# Patient Record
Sex: Female | Born: 1940 | Race: White | Hispanic: No | Smoking: Never smoker
Health system: Southern US, Community
[De-identification: ages and names within clinical notes are randomized; demographics above are authoritative.]

## PROBLEM LIST (undated history)

## (undated) DIAGNOSIS — M1990 Unspecified osteoarthritis, unspecified site: Secondary | ICD-10-CM

## (undated) DIAGNOSIS — C8300 Small cell B-cell lymphoma, unspecified site: Secondary | ICD-10-CM

## (undated) DIAGNOSIS — C8218 Follicular lymphoma grade II, lymph nodes of multiple sites: Secondary | ICD-10-CM

## (undated) DIAGNOSIS — I10 Essential (primary) hypertension: Secondary | ICD-10-CM

## (undated) DIAGNOSIS — Z86010 Personal history of colonic polyps: Principal | ICD-10-CM

## (undated) DIAGNOSIS — K219 Gastro-esophageal reflux disease without esophagitis: Secondary | ICD-10-CM

## (undated) DIAGNOSIS — D649 Anemia, unspecified: Secondary | ICD-10-CM

## (undated) DIAGNOSIS — E785 Hyperlipidemia, unspecified: Secondary | ICD-10-CM

## (undated) DIAGNOSIS — Z7189 Other specified counseling: Secondary | ICD-10-CM

## (undated) DIAGNOSIS — R011 Cardiac murmur, unspecified: Secondary | ICD-10-CM

## (undated) DIAGNOSIS — F32A Depression, unspecified: Secondary | ICD-10-CM

## (undated) DIAGNOSIS — F329 Major depressive disorder, single episode, unspecified: Secondary | ICD-10-CM

## (undated) DIAGNOSIS — E079 Disorder of thyroid, unspecified: Secondary | ICD-10-CM

## (undated) HISTORY — DX: Anemia, unspecified: D64.9

## (undated) HISTORY — DX: Cardiac murmur, unspecified: R01.1

## (undated) HISTORY — DX: Other specified counseling: Z71.89

## (undated) HISTORY — DX: Small cell B-cell lymphoma, unspecified site: C83.00

## (undated) HISTORY — DX: Gastro-esophageal reflux disease without esophagitis: K21.9

## (undated) HISTORY — DX: Disorder of thyroid, unspecified: E07.9

## (undated) HISTORY — DX: Unspecified osteoarthritis, unspecified site: M19.90

## (undated) HISTORY — DX: Hyperlipidemia, unspecified: E78.5

## (undated) HISTORY — DX: Major depressive disorder, single episode, unspecified: F32.9

## (undated) HISTORY — PX: TUBAL LIGATION: SHX77

## (undated) HISTORY — DX: Follicular lymphoma grade ii, lymph nodes of multiple sites: C82.18

## (undated) HISTORY — DX: Personal history of colonic polyps: Z86.010

## (undated) HISTORY — DX: Depression, unspecified: F32.A

## (undated) HISTORY — DX: Essential (primary) hypertension: I10

## (undated) HISTORY — PX: TONSILLECTOMY: SUR1361

---

## 2000-07-29 ENCOUNTER — Ambulatory Visit (HOSPITAL_COMMUNITY): Admission: RE | Admit: 2000-07-29 | Discharge: 2000-07-29 | Payer: Self-pay | Admitting: Obstetrics and Gynecology

## 2000-07-29 ENCOUNTER — Encounter: Payer: Self-pay | Admitting: Obstetrics and Gynecology

## 2000-08-10 ENCOUNTER — Other Ambulatory Visit: Admission: RE | Admit: 2000-08-10 | Discharge: 2000-08-10 | Payer: Self-pay | Admitting: Obstetrics and Gynecology

## 2001-08-20 ENCOUNTER — Encounter: Payer: Self-pay | Admitting: Obstetrics and Gynecology

## 2001-08-20 ENCOUNTER — Ambulatory Visit (HOSPITAL_COMMUNITY): Admission: RE | Admit: 2001-08-20 | Discharge: 2001-08-20 | Payer: Self-pay | Admitting: Obstetrics and Gynecology

## 2001-08-30 ENCOUNTER — Other Ambulatory Visit: Admission: RE | Admit: 2001-08-30 | Discharge: 2001-08-30 | Payer: Self-pay | Admitting: Obstetrics and Gynecology

## 2002-09-09 ENCOUNTER — Encounter: Payer: Self-pay | Admitting: Obstetrics and Gynecology

## 2002-09-09 ENCOUNTER — Ambulatory Visit (HOSPITAL_COMMUNITY): Admission: RE | Admit: 2002-09-09 | Discharge: 2002-09-09 | Payer: Self-pay | Admitting: Obstetrics and Gynecology

## 2003-10-25 ENCOUNTER — Ambulatory Visit (HOSPITAL_COMMUNITY): Admission: RE | Admit: 2003-10-25 | Discharge: 2003-10-25 | Payer: Self-pay | Admitting: Obstetrics and Gynecology

## 2004-12-03 ENCOUNTER — Ambulatory Visit: Admission: RE | Admit: 2004-12-03 | Discharge: 2004-12-03 | Payer: Self-pay | Admitting: Obstetrics and Gynecology

## 2005-05-09 ENCOUNTER — Ambulatory Visit: Payer: Self-pay | Admitting: Family Medicine

## 2005-05-28 ENCOUNTER — Ambulatory Visit: Payer: Self-pay | Admitting: Family Medicine

## 2006-08-17 ENCOUNTER — Ambulatory Visit: Payer: Self-pay | Admitting: Family Medicine

## 2006-09-15 ENCOUNTER — Ambulatory Visit: Payer: Self-pay | Admitting: Internal Medicine

## 2006-10-12 ENCOUNTER — Ambulatory Visit: Payer: Self-pay | Admitting: Family Medicine

## 2006-10-13 ENCOUNTER — Ambulatory Visit (HOSPITAL_COMMUNITY): Admission: RE | Admit: 2006-10-13 | Discharge: 2006-10-13 | Payer: Self-pay | Admitting: Family Medicine

## 2007-11-26 ENCOUNTER — Ambulatory Visit: Payer: Self-pay | Admitting: Family Medicine

## 2007-11-26 DIAGNOSIS — Z9089 Acquired absence of other organs: Secondary | ICD-10-CM | POA: Insufficient documentation

## 2007-11-26 DIAGNOSIS — F329 Major depressive disorder, single episode, unspecified: Secondary | ICD-10-CM

## 2007-11-26 DIAGNOSIS — E039 Hypothyroidism, unspecified: Secondary | ICD-10-CM | POA: Insufficient documentation

## 2007-11-26 DIAGNOSIS — I10 Essential (primary) hypertension: Secondary | ICD-10-CM

## 2007-11-29 ENCOUNTER — Encounter (INDEPENDENT_AMBULATORY_CARE_PROVIDER_SITE_OTHER): Payer: Self-pay | Admitting: *Deleted

## 2007-12-07 ENCOUNTER — Telehealth (INDEPENDENT_AMBULATORY_CARE_PROVIDER_SITE_OTHER): Payer: Self-pay | Admitting: *Deleted

## 2007-12-10 ENCOUNTER — Encounter: Payer: Self-pay | Admitting: Family Medicine

## 2007-12-14 ENCOUNTER — Ambulatory Visit: Payer: Self-pay | Admitting: Family Medicine

## 2007-12-14 DIAGNOSIS — E8881 Metabolic syndrome: Secondary | ICD-10-CM

## 2007-12-14 DIAGNOSIS — E782 Mixed hyperlipidemia: Secondary | ICD-10-CM

## 2008-02-18 ENCOUNTER — Ambulatory Visit (HOSPITAL_COMMUNITY): Admission: RE | Admit: 2008-02-18 | Discharge: 2008-02-18 | Payer: Self-pay | Admitting: Family Medicine

## 2008-02-25 ENCOUNTER — Encounter (INDEPENDENT_AMBULATORY_CARE_PROVIDER_SITE_OTHER): Payer: Self-pay | Admitting: *Deleted

## 2008-03-23 ENCOUNTER — Encounter: Payer: Self-pay | Admitting: Family Medicine

## 2008-03-23 ENCOUNTER — Ambulatory Visit: Payer: Self-pay | Admitting: Family Medicine

## 2008-03-23 ENCOUNTER — Other Ambulatory Visit: Admission: RE | Admit: 2008-03-23 | Discharge: 2008-03-23 | Payer: Self-pay | Admitting: Family Medicine

## 2008-03-23 DIAGNOSIS — Z9889 Other specified postprocedural states: Secondary | ICD-10-CM

## 2008-03-28 ENCOUNTER — Telehealth (INDEPENDENT_AMBULATORY_CARE_PROVIDER_SITE_OTHER): Payer: Self-pay | Admitting: *Deleted

## 2008-04-02 ENCOUNTER — Encounter (INDEPENDENT_AMBULATORY_CARE_PROVIDER_SITE_OTHER): Payer: Self-pay | Admitting: *Deleted

## 2008-04-05 ENCOUNTER — Telehealth (INDEPENDENT_AMBULATORY_CARE_PROVIDER_SITE_OTHER): Payer: Self-pay | Admitting: *Deleted

## 2009-03-19 ENCOUNTER — Ambulatory Visit: Payer: Self-pay | Admitting: Family Medicine

## 2009-03-19 DIAGNOSIS — S61209A Unspecified open wound of unspecified finger without damage to nail, initial encounter: Secondary | ICD-10-CM | POA: Insufficient documentation

## 2009-03-26 ENCOUNTER — Ambulatory Visit: Payer: Self-pay | Admitting: Family Medicine

## 2009-03-29 ENCOUNTER — Ambulatory Visit: Payer: Self-pay | Admitting: Family Medicine

## 2009-04-03 ENCOUNTER — Ambulatory Visit: Payer: Self-pay | Admitting: Family Medicine

## 2009-04-03 ENCOUNTER — Ambulatory Visit (HOSPITAL_COMMUNITY): Admission: RE | Admit: 2009-04-03 | Discharge: 2009-04-03 | Payer: Self-pay | Admitting: Family Medicine

## 2009-04-10 ENCOUNTER — Ambulatory Visit: Payer: Self-pay | Admitting: Family Medicine

## 2009-04-13 ENCOUNTER — Encounter: Payer: Self-pay | Admitting: Family Medicine

## 2009-04-20 ENCOUNTER — Encounter: Payer: Self-pay | Admitting: Family Medicine

## 2009-04-24 ENCOUNTER — Encounter: Payer: Self-pay | Admitting: Family Medicine

## 2009-06-04 ENCOUNTER — Encounter: Payer: Self-pay | Admitting: Family Medicine

## 2009-10-23 ENCOUNTER — Telehealth (INDEPENDENT_AMBULATORY_CARE_PROVIDER_SITE_OTHER): Payer: Self-pay | Admitting: *Deleted

## 2010-01-21 ENCOUNTER — Ambulatory Visit: Payer: Self-pay | Admitting: Family Medicine

## 2010-01-21 DIAGNOSIS — R51 Headache: Secondary | ICD-10-CM | POA: Insufficient documentation

## 2010-01-21 DIAGNOSIS — R519 Headache, unspecified: Secondary | ICD-10-CM | POA: Insufficient documentation

## 2010-01-22 ENCOUNTER — Ambulatory Visit: Payer: Self-pay | Admitting: Internal Medicine

## 2010-02-27 ENCOUNTER — Ambulatory Visit: Payer: Self-pay | Admitting: Family Medicine

## 2010-02-27 DIAGNOSIS — M810 Age-related osteoporosis without current pathological fracture: Secondary | ICD-10-CM

## 2010-02-27 DIAGNOSIS — R1319 Other dysphagia: Secondary | ICD-10-CM | POA: Insufficient documentation

## 2010-03-11 ENCOUNTER — Telehealth (INDEPENDENT_AMBULATORY_CARE_PROVIDER_SITE_OTHER): Payer: Self-pay | Admitting: *Deleted

## 2010-03-11 ENCOUNTER — Encounter: Payer: Self-pay | Admitting: Family Medicine

## 2010-04-17 ENCOUNTER — Ambulatory Visit (HOSPITAL_COMMUNITY): Admission: RE | Admit: 2010-04-17 | Discharge: 2010-04-17 | Payer: Self-pay | Admitting: Family Medicine

## 2011-01-03 ENCOUNTER — Encounter: Payer: Self-pay | Admitting: Family Medicine

## 2011-01-19 LAB — CONVERTED CEMR LAB
ALT: 24 units/L (ref 0–35)
AST: 16 units/L (ref 0–37)
AST: 21 units/L (ref 0–37)
Albumin: 4.1 g/dL (ref 3.5–5.2)
Albumin: 4.3 g/dL (ref 3.5–5.2)
BUN: 8 mg/dL (ref 6–23)
Basophils Absolute: 0 10*3/uL (ref 0.0–0.1)
Basophils Relative: 0.7 % (ref 0.0–1.0)
Basophils Relative: 1.2 % (ref 0.0–3.0)
Bilirubin, Direct: 0.1 mg/dL (ref 0.0–0.3)
CO2: 25 meq/L (ref 19–32)
CRP, High Sensitivity: 2 (ref 0.00–5.00)
Calcium: 9.3 mg/dL (ref 8.4–10.5)
Chloride: 100 meq/L (ref 96–112)
Chloride: 104 meq/L (ref 96–112)
Cholesterol: 191 mg/dL (ref 0–200)
Creatinine, Ser: 0.8 mg/dL (ref 0.4–1.2)
Creatinine, Ser: 0.8 mg/dL (ref 0.4–1.2)
Eosinophils Absolute: 0.1 10*3/uL (ref 0.0–0.7)
Eosinophils Absolute: 0.2 10*3/uL (ref 0.0–0.7)
Eosinophils Relative: 1.1 % (ref 0.0–5.0)
Eosinophils Relative: 3.3 % (ref 0.0–5.0)
GFR calc Af Amer: 93 mL/min
GFR calc non Af Amer: 75.85 mL/min (ref >60)
GFR calc non Af Amer: 89 mL/min
HCT: 40.5 % (ref 36.0–46.0)
HCT: 40.8 % (ref 36.0–46.0)
HDL: 52.8 mg/dL (ref >39.0)
Hemoglobin: 13.7 g/dL (ref 12.0–15.0)
Monocytes Absolute: 0.5 10*3/uL (ref 0.1–1.0)
Monocytes Absolute: 0.5 10*3/uL (ref 0.1–1.0)
Monocytes Relative: 8.5 % (ref 3.0–12.0)
Monocytes Relative: 9.6 % (ref 3.0–12.0)
Neutro Abs: 2.9 10*3/uL (ref 1.4–7.7)
Neutro Abs: 3.7 10*3/uL (ref 1.4–7.7)
Neutrophils Relative %: 61.1 % (ref 43.0–77.0)
Platelets: 288 10*3/uL (ref 150.0–400.0)
Potassium: 4 meq/L (ref 3.5–5.1)
RBC: 4.57 M/uL (ref 3.87–5.11)
TSH: 0.49 microintl units/mL (ref 0.35–5.50)
Total CHOL/HDL Ratio: 3.4
Triglycerides: 52 mg/dL (ref 0–149)
VLDL: 10 mg/dL (ref 0–40)
VLDL: 11.4 mg/dL (ref 0.0–40.0)
WBC: 5.4 10*3/uL (ref 4.5–10.5)

## 2011-01-23 NOTE — Medication Information (Signed)
Summary: Approval for Wellbutrin/[facility]  Approval for Wellbutrin/[facility]   Imported By: Lanelle Bal 03/14/2010 11:56:20  _____________________________________________________________________  External Attachment:    Type:   Image     Comment:   External Document

## 2011-01-23 NOTE — Progress Notes (Signed)
Summary: prior auth APPROVED [facility]   Phone Note Call from Patient   Caller: Patient Summary of Call: wellbutrin 300 mg prior auth [id number] Initial call taken by: [facility] CMA,  March 11, 2010 10:23 AM  Follow-up for Phone Call        prior auth approved 03-11-10 until 03-11-11. pt notify...............Marland KitchenFelecia Deloach CMA  March 11, 2010 10:25 AM

## 2011-01-23 NOTE — Assessment & Plan Note (Signed)
Summary: h/a x 4 days//lh   Vital Signs:  Patient profile:   70 year old female Height:      63 inches Weight:      234 pounds BMI:     41.60 Temp:     97.9 degrees F oral Pulse rate:   78 / minute Pulse rhythm:   regular BP sitting:   138 / 82  (left arm) Cuff size:   regular  Vitals Entered By: Army Fossa CMA (January 21, 2010 4:11 PM) CC: Pt states she has a HA x 4 days over right eye she can ease. , Headaches   History of Present Illness:  Headaches      This is a 70 year old woman who presents with Headaches.  The symptoms began 4 days ago.  The patient complains of sinus pain and sinus pressure, but denies nausea, vomiting, sweats, tearing of eyes, nasal congestion, photophobia, and phonophobia.  The headache is described as constant, dull, and pressure-like.  The location of the pain is unilateral on the right.  The patient denies the following high-risk features: fever, neck pain/stiffness, vision loss or change, focal weakness, altered mental status, rash, trauma, pain worse with exertion, new type of headache, age >50 years, immunosuppression, concomitant infection, and anticoagulation use.  The headaches are precipitated by stress.  Prior treatment has included a NSAID and acetaminophen.    Current Medications (verified): 1)  Wellbutrin Xl 300 Mg  Tb24 (Bupropion Hcl) .... Take One Tablet Daily 2)  Synthroid 88 Mcg  Tabs (Levothyroxine Sodium) .... Take One Tablet Daily 3)  Lisinopril-Hydrochlorothiazide 10-12.5 Mg  Tabs (Lisinopril-Hydrochlorothiazide) .Marland Kitchen.. 1 By Mouth Once Daily 4)  Ultram 50 Mg Tabs (Tramadol Hcl) .Marland Kitchen.. 1 By Mouth Every 6 Hours As Needed 5)  Augmentin 875-125 Mg Tabs (Amoxicillin-Pot Clavulanate) .Marland Kitchen.. 1 By Mouth Two Times A Day 6)  Flonase 50 Mcg/act Susp (Fluticasone Propionate) .... 2 Sprays Each Nostril Once Daily  Allergies: 1)  Asa  Past History:  Past medical, surgical, family and social histories (including risk factors) reviewed for  relevance to current acute and chronic problems.  Past Medical History: Reviewed history from 03/23/2008 and no changes required. Hypertension Depression Hypothyroidism Hyperlipidemia  Past Surgical History: Reviewed history from 03/23/2008 and no changes required. 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 Family History of CAD Female 1st degree relative <70s Mgm--DM2 Paunt-dM2 Family History Lung cancer-- father 13--smoker Family History Thyroid disease  Social History: Reviewed history from 03/23/2008 and no changes required. Retired Married Never Smoked Alcohol use-no Drug use-no Regular exercise-no  Review of Systems      See HPI  Physical Exam  General:  Well-developed,well-nourished,in no acute distress; alert,appropriate and cooperative throughout examination Eyes:  pupils equal, pupils round, pupils reactive to light, and no injection.   Ears:  External ear exam shows no significant lesions or deformities.  Otoscopic examination reveals clear canals, tympanic membranes are intact bilaterally without bulging, retraction, inflammation or discharge. Hearing is grossly normal bilaterally. Nose:  External nasal examination shows no deformity or inflammation. Nasal mucosa are pink and moist without lesions or exudates. Mouth:  Oral mucosa and oropharynx without lesions or exudates.  Teeth in good repair. Lungs:  Normal respiratory effort, chest expands symmetrically. Lungs are  clear to auscultation, no crackles or wheezes. Heart:  normal rate.   Neurologic:  alert & oriented X3,  cranial nerves II-XII intact, and gait normal.   Skin:  Intact without suspicious lesions or rashes Cervical Nodes:  No lymphadenopathy noted Psych:  Oriented X3, normally interactive, and good eye contact.     Impression & Recommendations:  Problem # 1:  HEADACHE (ICD-784.0)  ? sinus--check CT astepro and flonase rx abx if CT + sinusitis The following medications were removed from the medication list:    Vicodin Es 7.5-750 Mg Tabs (Hydrocodone-acetaminophen) .Marland Kitchen... 1 by mouth every 6 hours as needed Her updated medication list for this problem includes:    Ultram 50 Mg Tabs (Tramadol hcl) .Marland Kitchen... 1 by mouth every 6 hours as needed  Orders: Radiology Referral (Radiology)  Complete Medication List: 1)  Wellbutrin Xl 300 Mg Tb24 (Bupropion hcl) .... Take one tablet daily 2)  Synthroid 88 Mcg Tabs (Levothyroxine sodium) .... Take one tablet daily 3)  Lisinopril-hydrochlorothiazide 10-12.5 Mg Tabs (Lisinopril-hydrochlorothiazide) .Marland Kitchen.. 1 by mouth once daily 4)  Ultram 50 Mg Tabs (Tramadol hcl) .Marland Kitchen.. 1 by mouth every 6 hours as needed 5)  Augmentin 875-125 Mg Tabs (Amoxicillin-pot clavulanate) .Marland Kitchen.. 1 by mouth two times a day 6)  Flonase 50 Mcg/act Susp (Fluticasone propionate) .... 2 sprays each nostril once daily 7)  Astepro 0.15 % Soln (Azelastine hcl) .... 2 sprays each nostril once daily Prescriptions: ASTEPRO 0.15 % SOLN (AZELASTINE HCL) 2 sprays each nostril once daily  #1 x 0   Entered and Authorized by:   Loreen Freud DO   Signed by:   Loreen Freud DO on 01/21/2010   Method used:   Historical   RxID:   [id number] FLONASE 50 MCG/ACT SUSP (FLUTICASONE PROPIONATE) 2 sprays each nostril once daily  #1 x 0   Entered and Authorized by:   Loreen Freud DO   Signed by:   Loreen Freud DO on 01/21/2010   Method used:   Electronically to        [facility] [address] (retail)       [phone]       [place], [place]  [address]       Ph:  [phone]       Fax: [phone]   RxID:   [id number] AUGMENTIN 875-125 MG TABS (AMOXICILLIN-POT CLAVULANATE) 1 by mouth two times a day  #20 x 0   Entered and Authorized by:   Loreen Freud DO   Signed by:   Loreen Freud DO on 01/21/2010   Method used:   Print then Give to Patient   RxID:   [id number] ULTRAM 50 MG TABS (TRAMADOL HCL) 1 by mouth every 6 hours as needed  #30 x 0   Entered and Authorized by:   Loreen Freud DO   Signed by:   Loreen Freud DO on 01/21/2010   Method used:   Electronically to        [facility] [address] (retail)       [phone]       [place], [place]  [address]       Ph: [phone]       Fax: [phone]   RxID:   [id number]

## 2011-01-23 NOTE — Letter (Signed)
Summary: [facility]  [facility]/[place]   Imported By: Lanelle Bal 01/25/2010 14:03:36  _____________________________________________________________________  External Attachment:    Type:   Image     Comment:   External Document

## 2011-01-23 NOTE — Medication Information (Signed)
Summary: Bupropion/[facility]  Bupropion/[facility]   Imported By: Lanelle Bal 01/09/2011 12:41:14  _____________________________________________________________________  External Attachment:    Type:   Image     Comment:   External Document

## 2011-01-23 NOTE — Assessment & Plan Note (Signed)
Summary: Kristina Ramos is fasting//lh   Vital Signs:  Patient profile:   70 year old female Weight:      236 pounds Temp:     98.1 degrees F oral Pulse rate:   74 / minute Pulse rhythm:   regular BP sitting:   134 / 80  (left arm) Cuff size:   regular  Vitals Entered By: Army Fossa CMA (February 27, 2010 9:13 AM) CC: CPX, would like to discuss PAP   History of Present Illness: Pt here for cpe and labs.  Pt just started on medicare.  Pt c/o skin tag on inner L thigh. Pt also stopped nexium because she thought it was having side effects.  She is having difficulty swallowing --- she is taking pepcid with some relief.    Pt will wait for pap until next year.   Preventive Screening-Counseling & Management  Alcohol-Tobacco     Smoking Status: never     Passive Smoke Exposure: no  Caffeine-Diet-Exercise     Caffeine use/day: 5+     Does Patient Exercise: no     Exercise Counseling: to improve exercise regimen  Hep-HIV-STD-Contraception     HIV Risk: no     Dental Visit-last 6 months yes     Dental Care Counseling: not indicated; dental care within six months     SBE monthly: yes     SBE Education/Counseling: to perform regular SBE  Safety-Violence-Falls     Seat Belt Use: 100     Violence in the Home: no risk noted     Fall Risk: no      Sexual History:  currently monogamous.    Current Medications (verified): 1)  Wellbutrin Xl 300 Mg  Tb24 (Bupropion Hcl) .... Take One Tablet Daily 2)  Synthroid 88 Mcg  Tabs (Levothyroxine Sodium) .... Take One Tablet Daily 3)  Lisinopril-Hydrochlorothiazide 10-12.5 Mg  Tabs (Lisinopril-Hydrochlorothiazide) .Marland Kitchen.. 1 By Mouth Once Daily  Allergies: 1)  Asa  Past History:  Past Medical History: Last updated: 03/23/2008 Hypertension Depression Hypothyroidism Hyperlipidemia  Past Surgical History: Last updated: 03/23/2008 Current Problems:  ARTHROSCOPY, LEFT KNEE, HX OF (ICD-V45.89) DYSMETABOLIC SYNDROME X  (ICD-277.7) HYPERTENSION, ESSENTIAL NOS (ICD-401.9) HYPERLIPIDEMIA (ICD-272.2) DEPRESSION (ICD-311) HYPERTENSION (ICD-401.9) TONSILLECTOMY AND ADENOIDECTOMY, HX OF (ICD-V45.79) TUBAL LIGATION STERILIZATION STATUS (ICD-V26.51) HYPOTHYROIDISM (ICD-244.9) HRT (ICD-V07.4)  Family History: Last updated: 03/23/2008 Family History of CAD Female 1st degree relative <70s Mgm--DM2 Paunt-dM2 Family History Lung cancer-- father 14--smoker Family History Thyroid disease  Social History: Last updated: 03/23/2008 Retired Married Never Smoked Alcohol use-no Drug use-no Regular exercise-no  Risk Factors: Caffeine Use: 5+ (02/27/2010) Exercise: no (02/27/2010)  Risk Factors: Smoking Status: never (02/27/2010) Passive Smoke Exposure: no (02/27/2010)  Family History: Reviewed history from 03/23/2008 and no changes required. Family History of CAD Female 1st degree relative <70s Mgm--DM2 Paunt-dM2 Family History Lung cancer-- father 57--smoker Family History Thyroid disease  Social History: Reviewed history from 03/23/2008 and no changes required. Retired Married Never Smoked Alcohol use-no Drug use-no Regular exercise-no Dental Care w/in 6 mos.:  yes Sexual History:  currently monogamous Fall Risk:  no  Review of Systems      See HPI General:  Denies chills, fatigue, fever, loss of appetite, malaise, sleep disorder, sweats, weakness, and weight loss. Eyes:  Denies blurring, discharge, double vision, eye irritation, eye pain, halos, itching, light sensitivity, red eye, vision loss-1 eye, and vision loss-both eyes; optho- no-- due. ENT:  Denies decreased hearing, difficulty swallowing, ear discharge, earache, hoarseness, nasal congestion, nosebleeds, postnasal  drainage, ringing in ears, sinus pressure, and sore throat. CV:  Denies bluish discoloration of lips or nails, chest pain or discomfort, difficulty breathing at night, difficulty breathing while lying down, fainting,  fatigue, leg cramps with exertion, lightheadness, near fainting, palpitations, shortness of breath with exertion, swelling of feet, swelling of hands, and weight gain. Resp:  Denies chest discomfort, chest pain with inspiration, cough, coughing up blood, excessive snoring, hypersomnolence, morning headaches, pleuritic, shortness of breath, sputum productive, and wheezing. GI:  Denies abdominal pain, bloody stools, change in bowel habits, constipation, dark tarry stools, diarrhea, excessive appetite, gas, hemorrhoids, indigestion, loss of appetite, nausea, vomiting, vomiting blood, and yellowish skin color. GU:  Denies abnormal vaginal bleeding, decreased libido, discharge, dysuria, genital sores, hematuria, incontinence, nocturia, urinary frequency, and urinary hesitancy. MS:  Denies joint pain, joint redness, joint swelling, loss of strength, low back pain, mid back pain, muscle aches, muscle , cramps, muscle weakness, stiffness, and thoracic pain. Derm:  Complains of itching; denies changes in color of skin, changes in nail beds, dryness, excessive perspiration, flushing, hair loss, insect bite(s), lesion(s), poor wound healing, and rash. Neuro:  Denies brief paralysis, difficulty with concentration, disturbances in coordination, falling down, headaches, inability to speak, memory loss, numbness, poor balance, seizures, sensation of room spinning, tingling, tremors, visual disturbances, and weakness. Psych:  Denies alternate hallucination ( auditory/visual), anxiety, depression, easily angered, easily tearful, irritability, mental problems, panic attacks, sense of great danger, suicidal thoughts/plans, thoughts of violence, unusual visions or sounds, and thoughts /plans of harming others. Endo:  Denies cold intolerance, excessive hunger, excessive thirst, excessive urination, heat intolerance, polyuria, and weight change. Heme:  Denies abnormal bruising, bleeding, enlarge lymph nodes, fevers, pallor, and  skin discoloration. Allergy:  Denies hives or rash, itching eyes, persistent infections, seasonal allergies, and sneezing.  Physical Exam  General:  Well-developed,well-nourished,in no acute distress; alert,appropriate and cooperative throughout examination Head:  Normocephalic and atraumatic without obvious abnormalities. No apparent alopecia or balding. Eyes:  pupils equal, pupils round, pupils reactive to light, and no injection.   Ears:  External ear exam shows no significant lesions or deformities.  Otoscopic examination reveals clear canals, tympanic membranes are intact bilaterally without bulging, retraction, inflammation or discharge. Hearing is grossly normal bilaterally. Nose:  External nasal examination shows no deformity or inflammation. Nasal mucosa are pink and moist without lesions or exudates. Mouth:  Oral mucosa and oropharynx without lesions or exudates.  Teeth in good repair. Neck:  No deformities, masses, or tenderness noted. Chest Wall:  No deformities, masses, or tenderness noted. Breasts:  No mass, nodules, thickening, tenderness, bulging, retraction, inflamation, nipple discharge or skin changes noted.   Lungs:  Normal respiratory effort, chest expands symmetrically. Lungs are clear to auscultation, no crackles or wheezes. Heart:  normal rate and no murmur.   Abdomen:  Bowel sounds positive,abdomen soft and non-tender without masses, organomegaly or hernias noted. Rectal:  deferred Genitalia:  deferred Msk:  normal ROM, no joint tenderness, no joint swelling, no joint warmth, no redness over joints, no joint deformities, no joint instability, and no crepitation.   Pulses:  R posterior tibial normal, R dorsalis pedis normal, R carotid normal, L posterior tibial normal, L dorsalis pedis normal, and L carotid normal.   Extremities:  No clubbing, cyanosis, edema, or deformity noted with normal full range of motion of all joints.   Neurologic:  No cranial nerve deficits  noted. Station and gait are normal. Plantar reflexes are down-going bilaterally. DTRs are symmetrical throughout. Sensory, motor and  coordinative functions appear intact. Skin:  Intact without suspicious lesions or rashes Cervical Nodes:  No lymphadenopathy noted Axillary Nodes:  No palpable lymphadenopathy Psych:  Cognition and judgment appear intact. Alert and cooperative with normal attention span and concentration. 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 1 by mouth once daily  Orders: Venipuncture (60454) TLB-Lipid Panel (80061-LIPID) TLB-BMP (Basic Metabolic Panel-BMET) (80048-METABOL) TLB-CBC Platelet - w/Differential (85025-CBCD) TLB-Hepatic/Liver Function Pnl (80076-HEPATIC) TLB-TSH (Thyroid Stimulating Hormone) (84443-TSH)  Problem # 4:  HYPERLIPIDEMIA (ICD-272.2)  Orders: Venipuncture (09811) TLB-Lipid Panel (80061-LIPID) TLB-BMP (Basic Metabolic Panel-BMET) (80048-METABOL) TLB-CBC Platelet - w/Differential (85025-CBCD) TLB-Hepatic/Liver Function Pnl (80076-HEPATIC) TLB-TSH (Thyroid Stimulating Hormone) (84443-TSH)  Problem # 5:  DEPRESSION (ICD-311)  Her updated medication list for this problem includes:     Wellbutrin Xl 300 Mg Tb24 (Bupropion hcl) .Marland Kitchen... Take one tablet daily  Problem # 6:  HYPOTHYROIDISM (ICD-244.9)  Her updated medication list for this problem includes:    Synthroid 88 Mcg Tabs (Levothyroxine sodium) .Marland Kitchen... Take one tablet daily  Orders: Venipuncture (91478) TLB-Lipid Panel (80061-LIPID) TLB-BMP (Basic Metabolic Panel-BMET) (80048-METABOL) TLB-CBC Platelet - w/Differential (85025-CBCD) TLB-Hepatic/Liver Function Pnl (80076-HEPATIC) TLB-TSH (Thyroid Stimulating Hormone) (84443-TSH)  Complete Medication List: 1)  Wellbutrin Xl 300 Mg Tb24 (Bupropion hcl) .... Take one tablet daily 2)  Synthroid 88 Mcg Tabs (Levothyroxine sodium) .... Take one tablet daily 3)  Lisinopril-hydrochlorothiazide 10-12.5 Mg Tabs (Lisinopril-hydrochlorothiazide) .Marland Kitchen.. 1 by mouth once daily  Other Orders: Pneumococcal Vaccine (29562) Admin 1st Vaccine (13086) Prescriptions: LISINOPRIL-HYDROCHLOROTHIAZIDE 10-12.5 MG  TABS (LISINOPRIL-HYDROCHLOROTHIAZIDE) 1 by mouth once daily  #90 x 3   Entered and Authorized by:   Loreen Freud DO   Signed by:   Loreen Freud DO on 02/27/2010   Method used:   Print then Give to Patient   RxID:   [id number] SYNTHROID 88 MCG  TABS (LEVOTHYROXINE SODIUM) TAKE ONE TABLET DAILY Brand medically necessary #90 x 3   Entered and Authorized by:   Loreen Freud DO   Signed by:   Loreen Freud DO on 02/27/2010   Method used:   Print then Give to Patient   RxID:   [id number] WELLBUTRIN XL 300 MG  TB24 (BUPROPION HCL) TAKE ONE TABLET DAILY Brand medically necessary #90 x 3   Entered and Authorized by:   Loreen Freud DO   Signed by:   Loreen Freud DO on 02/27/2010   Method used:   Print then Give to Patient   RxID:   [id number]    EKG  Procedure date:  02/27/2010  Findings:      Normal sinus rhythm with rate of:  75 bpm    Flu Vaccine Next Due:  Refused Herpes Zoster Next Due:  Refused    Immunizations Administered:  Pneumonia Vaccine:     Vaccine Type: Pneumovax    Site: left deltoid    Mfr: Merck    Dose: 0.5 ml    Route: IM    Given by: Army Fossa CMA  Exp. Date: 04/11/2011    Lot #: [id number]   Immunizations Administered:  Pneumonia Vaccine:    Vaccine Type: Pneumovax    Site: left deltoid    Mfr: Merck    Dose: 0.5 ml    Route: IM    Given by: Army Fossa CMA    Exp. Date: 04/11/2011    Lot #: [id number]

## 2011-08-08 ENCOUNTER — Ambulatory Visit (INDEPENDENT_AMBULATORY_CARE_PROVIDER_SITE_OTHER): Payer: Medicare Other | Admitting: Family Medicine

## 2011-08-08 ENCOUNTER — Encounter: Payer: Self-pay | Admitting: Family Medicine

## 2011-08-08 VITALS — BP 170/86 | HR 80 | Temp 97.7°F | Wt 230.0 lb

## 2011-08-08 DIAGNOSIS — H6090 Unspecified otitis externa, unspecified ear: Secondary | ICD-10-CM

## 2011-08-08 DIAGNOSIS — R52 Pain, unspecified: Secondary | ICD-10-CM

## 2011-08-08 DIAGNOSIS — F329 Major depressive disorder, single episode, unspecified: Secondary | ICD-10-CM

## 2011-08-08 DIAGNOSIS — Z780 Asymptomatic menopausal state: Secondary | ICD-10-CM

## 2011-08-08 DIAGNOSIS — M791 Myalgia, unspecified site: Secondary | ICD-10-CM

## 2011-08-08 DIAGNOSIS — M949 Disorder of cartilage, unspecified: Secondary | ICD-10-CM

## 2011-08-08 DIAGNOSIS — IMO0001 Reserved for inherently not codable concepts without codable children: Secondary | ICD-10-CM

## 2011-08-08 DIAGNOSIS — R5381 Other malaise: Secondary | ICD-10-CM

## 2011-08-08 DIAGNOSIS — E039 Hypothyroidism, unspecified: Secondary | ICD-10-CM

## 2011-08-08 DIAGNOSIS — H60399 Other infective otitis externa, unspecified ear: Secondary | ICD-10-CM

## 2011-08-08 DIAGNOSIS — I10 Essential (primary) hypertension: Secondary | ICD-10-CM

## 2011-08-08 DIAGNOSIS — R5383 Other fatigue: Secondary | ICD-10-CM

## 2011-08-08 DIAGNOSIS — M898X9 Other specified disorders of bone, unspecified site: Secondary | ICD-10-CM

## 2011-08-08 MED ORDER — BUPROPION HCL ER (XL) 150 MG PO TB24: 150.0000 mg | ORAL_TABLET | ORAL | Status: DC

## 2011-08-08 MED ORDER — LISINOPRIL-HYDROCHLOROTHIAZIDE 10-12.5 MG PO TABS: 1.0000 | ORAL_TABLET | Freq: Every day | ORAL | Status: DC

## 2011-08-08 MED ORDER — OFLOXACIN 0.3 % OT SOLN: OTIC | Status: DC

## 2011-08-08 MED ORDER — LEVOTHYROXINE SODIUM 88 MCG PO TABS: 88.0000 ug | ORAL_TABLET | Freq: Every day | ORAL | Status: DC

## 2011-08-08 NOTE — Progress Notes (Signed)
  Subjective:     Kristina Ramos is a 70 y.o. female who presents for evaluation of right ear pain. Symptoms have been present for several days. She also notes a plugged sensation in the right ear. She does not have a history of ear infections. She does not have a history of recent swimming.  The patient's history has been marked as reviewed and updated as appropriate.   Review of Systems Pertinent items are noted in HPI.   Objective:    BP 170/86  Pulse 80  Temp(Src) 97.7 F (36.5 C) (Oral)  Wt 230 lb (104.327 kg)  SpO2 98% General:  alert, cooperative, appears stated age and no distress  Right Ear: + cerumen--ear irrigated and canal is red  Left Ear: normal appearance  Mouth:  lips, mucosa, and tongue normal; teeth and gums normal  Neck: no adenopathy, no carotid bruit, no JVD, supple, symmetrical, trachea midline and thyroid not enlarged, symmetric, no tenderness/mass/nodules       Assessment:    Right otitis externa   HTN---uncontrolled-- pt off meds---start meds and rto 2 weeks to recheck--d/w meds and not running out Hypothyroid--pt has been off meds Depression.--- pt has been off meds---her husband died a few months ago and she needs to go back on her medication--restart wellbutrin Plan:    Treatment: Floxin Otic. OTC analgesia as needed. Water exclusion from affected ear until symptoms resolve. Follow up in 5 days if symptoms not improving.

## 2011-08-11 ENCOUNTER — Other Ambulatory Visit: Payer: Self-pay | Admitting: *Deleted

## 2011-08-11 DIAGNOSIS — H6090 Unspecified otitis externa, unspecified ear: Secondary | ICD-10-CM

## 2011-08-11 MED ORDER — OFLOXACIN 0.3 % OT SOLN: OTIC | Status: DC

## 2011-08-14 ENCOUNTER — Other Ambulatory Visit (INDEPENDENT_AMBULATORY_CARE_PROVIDER_SITE_OTHER): Payer: Medicare Other

## 2011-08-14 DIAGNOSIS — E039 Hypothyroidism, unspecified: Secondary | ICD-10-CM

## 2011-08-14 DIAGNOSIS — F329 Major depressive disorder, single episode, unspecified: Secondary | ICD-10-CM

## 2011-08-14 DIAGNOSIS — I10 Essential (primary) hypertension: Secondary | ICD-10-CM

## 2011-08-14 LAB — BASIC METABOLIC PANEL
CO2: 29 mEq/L (ref 19–32)
Calcium: 9.2 mg/dL (ref 8.4–10.5)
Chloride: 98 mEq/L (ref 96–112)
Creatinine, Ser: 0.7 mg/dL (ref 0.4–1.2)
GFR: 82.63 mL/min (ref >60.00)
Glucose, Bld: 108 mg/dL — ABNORMAL HIGH (ref 70–99)
Potassium: 4 mEq/L (ref 3.5–5.1)
Sodium: 135 mEq/L (ref 135–145)

## 2011-08-14 LAB — LIPID PANEL: Total CHOL/HDL Ratio: 3

## 2011-08-14 LAB — CBC WITH DIFFERENTIAL/PLATELET
Eosinophils Absolute: 0.1 10*3/uL (ref 0.0–0.7)
Eosinophils Relative: 2.6 % (ref 0.0–5.0)
HCT: 41.3 % (ref 36.0–46.0)
Lymphocytes Relative: 31.7 % (ref 12.0–46.0)
Lymphs Abs: 1.8 10*3/uL (ref 0.7–4.0)
MCHC: 33.4 g/dL (ref 30.0–36.0)
Monocytes Relative: 8.7 % (ref 3.0–12.0)
Neutro Abs: 3.1 10*3/uL (ref 1.4–7.7)
RDW: 13.2 % (ref 11.5–14.6)

## 2011-08-14 LAB — HEPATIC FUNCTION PANEL
ALT: 14 U/L (ref 0–35)
AST: 15 U/L (ref 0–37)
Albumin: 4.4 g/dL (ref 3.5–5.2)

## 2011-08-14 NOTE — Progress Notes (Signed)
Labs only

## 2011-10-27 ENCOUNTER — Encounter: Payer: Self-pay | Admitting: Family Medicine

## 2011-10-27 ENCOUNTER — Ambulatory Visit (INDEPENDENT_AMBULATORY_CARE_PROVIDER_SITE_OTHER): Payer: Medicare Other | Admitting: Family Medicine

## 2011-10-27 VITALS — BP 132/84 | HR 97 | Temp 98.4°F | Wt 225.6 lb

## 2011-10-27 DIAGNOSIS — S8010XA Contusion of unspecified lower leg, initial encounter: Secondary | ICD-10-CM

## 2011-10-27 NOTE — Progress Notes (Signed)
  Subjective:    Patient ID: Kristina Ramos, female    DOB: 1941/02/14, 70 y.o.   MRN: [id number]  HPI Pt here c/o bruising L leg after falling last week.   Pt has subsided but she is very concerned about bruisng in thigh.   Pt states she tripped over a pair of shoes fell forward on both knees.    Review of Systems As above.    Objective:   Physical Exam  Constitutional: She appears well-developed and well-nourished.  Cardiovascular: Normal rate, regular rhythm and normal heart sounds.   Pulmonary/Chest: Effort normal and breath sounds normal.  Skin:       L thigh---+ ecchymosis thigh--- starting to resolve       No calf pain,  No knee pain           Assessment & Plan:  Contusion L leg----warm compresses,  Rest , elevation,  rto if symptoms persist or worsen

## 2011-10-27 NOTE — Patient Instructions (Signed)
Contusion A bruise (contusion) or hematoma is a collection of blood under skin causing an area of discoloration. It is caused by an injury to blood vessels beneath the injured area with a release of blood into that area. As blood accumulates it is known as a hematoma. This collection of blood causes a blue to dark blue color. As the injury improves over days to weeks it turns to a yellowish color and then usually disappears completely over the same period of time. These generally resolve completely without problems. The hematoma rarely requires drainage. HOME CARE INSTRUCTIONS   Apply ice to the injured area for 15 to 20 minutes 3 to 4 times per day for the first 1 or 2 days.   Put the ice in a plastic bag and place a towel between the bag of ice and your skin. Discontinue the ice if it causes pain.   If bleeding is more than just a little, apply pressure to the area for at least thirty minutes to decrease the amount of bruising. Apply pressure and ice as your caregiver suggests.   If the injury is on an extremity, elevation of that part may help to decrease pain and swelling. Wrapping with an ace or supportive wrap may also be helpful. If the bruise is on a lower extremity and is painful, crutches may be helpful for a couple days.   If you have been given a tetanus shot because the skin was broken, your arm may get swollen, red and warm to touch at the shot site. This is a normal response to the medicine in the shot. If you did not receive a tetanus shot today because you did not recall when your last one was given, make sure to check with your caregiver's office and determine if one is needed. Generally for a "dirty" wound, you should receive a tetanus booster if you have not had one in the last five years. If you have a "clean" wound, you should receive a tetanus booster if you have not had one within the last ten years.  SEEK MEDICAL CARE IF:   You have pain not controlled with over the counter  medications. Only take over-the-counter or prescription medicines for pain, discomfort, or fever as directed by your caregiver. Do not use aspirin as it may cause bleeding.   You develop increasing pain or swelling in the area of injury.   You develop any problems which seem worse than the problems which brought you in.  SEEK IMMEDIATE MEDICAL CARE IF:   You have a fever.   You develop severe pain in the area of the bruise out of proportion to the initial injury.   The bruised area becomes red, tender, and swollen.  MAKE SURE YOU:   Understand these instructions.   Will watch your condition.   Will get help right away if you are not doing well or get worse.  Document Released: 09/17/2005 Document Revised: 08/20/2011 Document Reviewed: 07/26/2008 [facility] Patient Information 2012 [place], [place].

## 2011-12-27 ENCOUNTER — Ambulatory Visit (INDEPENDENT_AMBULATORY_CARE_PROVIDER_SITE_OTHER): Payer: Medicare Other | Admitting: Family Medicine

## 2011-12-27 ENCOUNTER — Encounter: Payer: Self-pay | Admitting: Family Medicine

## 2011-12-27 VITALS — BP 130/74 | HR 74 | Temp 97.8°F | Ht 64.0 in | Wt 226.0 lb

## 2011-12-27 DIAGNOSIS — B379 Candidiasis, unspecified: Secondary | ICD-10-CM | POA: Diagnosis not present

## 2011-12-27 NOTE — Progress Notes (Signed)
  Subjective:    Patient ID: Kristina Ramos, female    DOB: 08-Sep-1941, 71 y.o.   MRN: [id number]  HPI  Saturday clinic. Slightly pruritic rash under both breasts. Per patient noted few days ago. Patient is nondiabetic. No recent prednisone use.  No other areas of involvement.  Nonpainful.  Patient initially thought this was shingles.   Review of Systems  Constitutional: Negative for fever and chills.  Hematological: Negative for adenopathy.       Objective:   Physical Exam  Constitutional: She appears well-developed and well-nourished.  Cardiovascular: Normal rate and regular rhythm.   Pulmonary/Chest: Effort normal and breath sounds normal. No respiratory distress. She has no wheezes. She has no rales.  Skin:       Minimally raised papular erythematous slightly scaly nonspecific non-pustular rash under both breasts.          Assessment & Plan:  Probable fungal rash with candida. Try over-the-counter antifungal and followup as needed (Lamisil or Lotrimin) .  Keep area as dry as possible.

## 2011-12-27 NOTE — Patient Instructions (Signed)
Keep area as dry as possible. Consider over-the-counter antifungal cream such as Lamisil or Lotrimin

## 2011-12-29 ENCOUNTER — Other Ambulatory Visit: Payer: Self-pay | Admitting: Family Medicine

## 2011-12-29 DIAGNOSIS — Z1231 Encounter for screening mammogram for malignant neoplasm of breast: Secondary | ICD-10-CM

## 2012-01-23 ENCOUNTER — Ambulatory Visit (HOSPITAL_COMMUNITY)
Admission: RE | Admit: 2012-01-23 | Discharge: 2012-01-23 | Disposition: A | Discharge status: Home / Self Care | Payer: Medicare Other | Source: Ambulatory Visit | Attending: Family Medicine | Admitting: Family Medicine

## 2012-01-23 DIAGNOSIS — Z1231 Encounter for screening mammogram for malignant neoplasm of breast: Secondary | ICD-10-CM | POA: Diagnosis not present

## 2012-01-26 DIAGNOSIS — H6980 Other specified disorders of Eustachian tube, unspecified ear: Secondary | ICD-10-CM | POA: Diagnosis not present

## 2012-01-26 DIAGNOSIS — H902 Conductive hearing loss, unspecified: Secondary | ICD-10-CM | POA: Diagnosis not present

## 2012-01-29 ENCOUNTER — Other Ambulatory Visit: Payer: Self-pay | Admitting: Family Medicine

## 2012-01-29 DIAGNOSIS — R928 Other abnormal and inconclusive findings on diagnostic imaging of breast: Secondary | ICD-10-CM

## 2012-02-06 ENCOUNTER — Ambulatory Visit
Admission: RE | Admit: 2012-02-06 | Discharge: 2012-02-06 | Disposition: A | Discharge status: Home / Self Care | Payer: Medicare Other | Source: Ambulatory Visit | Attending: Family Medicine | Admitting: Family Medicine

## 2012-02-06 DIAGNOSIS — N6009 Solitary cyst of unspecified breast: Secondary | ICD-10-CM | POA: Diagnosis not present

## 2012-02-06 DIAGNOSIS — N63 Unspecified lump in unspecified breast: Secondary | ICD-10-CM | POA: Diagnosis not present

## 2012-02-06 DIAGNOSIS — R928 Other abnormal and inconclusive findings on diagnostic imaging of breast: Secondary | ICD-10-CM

## 2012-06-09 DIAGNOSIS — M1710 Unilateral primary osteoarthritis, unspecified knee: Secondary | ICD-10-CM | POA: Diagnosis not present

## 2012-06-09 DIAGNOSIS — M76899 Other specified enthesopathies of unspecified lower limb, excluding foot: Secondary | ICD-10-CM | POA: Diagnosis not present

## 2012-06-09 DIAGNOSIS — M25579 Pain in unspecified ankle and joints of unspecified foot: Secondary | ICD-10-CM | POA: Diagnosis not present

## 2012-07-29 DIAGNOSIS — S52123A Displaced fracture of head of unspecified radius, initial encounter for closed fracture: Secondary | ICD-10-CM | POA: Diagnosis not present

## 2012-08-10 DIAGNOSIS — S52123A Displaced fracture of head of unspecified radius, initial encounter for closed fracture: Secondary | ICD-10-CM | POA: Diagnosis not present

## 2012-08-16 DIAGNOSIS — S52123A Displaced fracture of head of unspecified radius, initial encounter for closed fracture: Secondary | ICD-10-CM | POA: Diagnosis not present

## 2012-08-16 DIAGNOSIS — W19XXXA Unspecified fall, initial encounter: Secondary | ICD-10-CM | POA: Diagnosis not present

## 2012-08-16 DIAGNOSIS — M949 Disorder of cartilage, unspecified: Secondary | ICD-10-CM | POA: Diagnosis not present

## 2012-08-31 DIAGNOSIS — S52123A Displaced fracture of head of unspecified radius, initial encounter for closed fracture: Secondary | ICD-10-CM | POA: Diagnosis not present

## 2012-09-02 ENCOUNTER — Other Ambulatory Visit: Payer: Self-pay | Admitting: General Practice

## 2012-09-02 ENCOUNTER — Other Ambulatory Visit: Payer: Self-pay | Admitting: Family Medicine

## 2012-09-02 DIAGNOSIS — I10 Essential (primary) hypertension: Secondary | ICD-10-CM

## 2012-09-02 MED ORDER — LISINOPRIL-HYDROCHLOROTHIAZIDE 10-12.5 MG PO TABS: 1.0000 | ORAL_TABLET | Freq: Every day | ORAL | Status: DC

## 2012-09-27 DIAGNOSIS — S52123A Displaced fracture of head of unspecified radius, initial encounter for closed fracture: Secondary | ICD-10-CM | POA: Diagnosis not present

## 2012-10-11 DIAGNOSIS — M1710 Unilateral primary osteoarthritis, unspecified knee: Secondary | ICD-10-CM | POA: Diagnosis not present

## 2012-11-25 DIAGNOSIS — IMO0002 Reserved for concepts with insufficient information to code with codable children: Secondary | ICD-10-CM | POA: Diagnosis not present

## 2012-11-25 DIAGNOSIS — M1710 Unilateral primary osteoarthritis, unspecified knee: Secondary | ICD-10-CM | POA: Diagnosis not present

## 2012-11-29 ENCOUNTER — Encounter: Payer: Self-pay | Admitting: Family Medicine

## 2012-11-29 ENCOUNTER — Ambulatory Visit (INDEPENDENT_AMBULATORY_CARE_PROVIDER_SITE_OTHER): Payer: Medicare Other | Admitting: Family Medicine

## 2012-11-29 VITALS — BP 124/80 | HR 87 | Temp 98.1°F | Wt 230.6 lb

## 2012-11-29 DIAGNOSIS — E039 Hypothyroidism, unspecified: Secondary | ICD-10-CM

## 2012-11-29 DIAGNOSIS — F329 Major depressive disorder, single episode, unspecified: Secondary | ICD-10-CM | POA: Diagnosis not present

## 2012-11-29 DIAGNOSIS — F32A Depression, unspecified: Secondary | ICD-10-CM

## 2012-11-29 DIAGNOSIS — M1710 Unilateral primary osteoarthritis, unspecified knee: Secondary | ICD-10-CM | POA: Diagnosis not present

## 2012-11-29 DIAGNOSIS — M179 Osteoarthritis of knee, unspecified: Secondary | ICD-10-CM

## 2012-11-29 DIAGNOSIS — I10 Essential (primary) hypertension: Secondary | ICD-10-CM

## 2012-11-29 LAB — BASIC METABOLIC PANEL
CO2: 23 mEq/L (ref 19–32)
Calcium: 9.1 mg/dL (ref 8.4–10.5)
GFR: 76.34 mL/min (ref >60.00)
Potassium: 4 mEq/L (ref 3.5–5.1)

## 2012-11-29 LAB — TSH: TSH: 1.22 u[IU]/mL (ref 0.35–5.50)

## 2012-11-29 MED ORDER — HYLAN 8 MG/ML IX INJ: 16.0000 mg | INJECTION | INTRA_ARTICULAR | Status: DC

## 2012-11-29 MED ORDER — LEVOTHYROXINE SODIUM 88 MCG PO TABS: 88.0000 ug | ORAL_TABLET | Freq: Every day | ORAL | Status: DC

## 2012-11-29 MED ORDER — BUPROPION HCL ER (XL) 150 MG PO TB24: 300.0000 mg | ORAL_TABLET | ORAL | Status: DC

## 2012-11-29 MED ORDER — LISINOPRIL-HYDROCHLOROTHIAZIDE 10-12.5 MG PO TABS: 1.0000 | ORAL_TABLET | Freq: Every day | ORAL | Status: DC

## 2012-11-29 NOTE — Progress Notes (Signed)
  Subjective:    Patient here for follow-up of elevated blood pressure.  She is not exercising and is adherent to a low-salt diet.  Blood pressure is well controlled at home. Cardiac symptoms: none. Patient denies: chest pain, chest pressure/discomfort, claudication, dyspnea, exertional chest pressure/discomfort, irregular heart beat, lower extremity edema, near-syncope, orthopnea, palpitations, paroxysmal nocturnal dyspnea, syncope and tachypnea. Cardiovascular risk factors: advanced age (older than 57 for men, 81 for women), hypertension, obesity (BMI >= 30 kg/m2) and sedentary lifestyle. Use of agents associated with hypertension: none. History of target organ damage: none.  The following portions of the patient's history were reviewed and updated as appropriate: allergies, current medications, past family history, past medical history, past social history, past surgical history and problem list.  Review of Systems Pertinent items are noted in HPI.     Objective:    BP 124/80  Pulse 87  Temp 98.1 F (36.7 C) (Oral)  Wt 230 lb 9.6 oz (104.599 kg)  SpO2 96% General appearance: alert, cooperative, appears stated age and no distress Lungs: clear to auscultation bilaterally Heart: S1, S2 normal Extremities: edema L > R    tr pitting edema Neurologic: Alert and oriented X 3, normal strength and tone. Normal symmetric reflexes. Normal coordination and gait    Assessment:    Hypertension, normal blood pressure . Evidence of target organ damage: none.   depression/ anxiety--- inc wellbutrin per orders  hypothyroid---  Check labs rto cpe Plan:    Medication: increase to wellbutrin xl 300 mg daily. Dietary sodium restriction. Regular aerobic exercise. Follow up: 1 month and as needed.

## 2012-11-29 NOTE — Patient Instructions (Addendum)

## 2013-04-21 ENCOUNTER — Other Ambulatory Visit: Payer: Self-pay | Admitting: General Practice

## 2013-04-21 DIAGNOSIS — F329 Major depressive disorder, single episode, unspecified: Secondary | ICD-10-CM

## 2013-04-21 MED ORDER — BUPROPION HCL ER (XL) 150 MG PO TB24: 300.0000 mg | ORAL_TABLET | ORAL | Status: DC

## 2013-04-21 NOTE — Telephone Encounter (Signed)
Med filled.  

## 2013-08-08 ENCOUNTER — Other Ambulatory Visit: Payer: Self-pay | Admitting: Family Medicine

## 2013-08-08 DIAGNOSIS — Z1231 Encounter for screening mammogram for malignant neoplasm of breast: Secondary | ICD-10-CM

## 2013-08-11 ENCOUNTER — Ambulatory Visit (HOSPITAL_COMMUNITY)
Admission: RE | Admit: 2013-08-11 | Discharge: 2013-08-11 | Disposition: A | Discharge status: Home / Self Care | Payer: Medicare Other | Source: Ambulatory Visit | Attending: Family Medicine | Admitting: Family Medicine

## 2013-08-11 DIAGNOSIS — Z1231 Encounter for screening mammogram for malignant neoplasm of breast: Secondary | ICD-10-CM | POA: Diagnosis not present

## 2013-11-02 DIAGNOSIS — M1710 Unilateral primary osteoarthritis, unspecified knee: Secondary | ICD-10-CM | POA: Diagnosis not present

## 2013-11-02 DIAGNOSIS — M25569 Pain in unspecified knee: Secondary | ICD-10-CM | POA: Diagnosis not present

## 2013-11-03 DIAGNOSIS — M899 Disorder of bone, unspecified: Secondary | ICD-10-CM | POA: Diagnosis not present

## 2013-12-08 ENCOUNTER — Ambulatory Visit: Payer: Medicare Other | Admitting: Family Medicine

## 2013-12-27 ENCOUNTER — Encounter: Payer: Self-pay | Admitting: Family Medicine

## 2013-12-27 ENCOUNTER — Ambulatory Visit (INDEPENDENT_AMBULATORY_CARE_PROVIDER_SITE_OTHER): Payer: Medicare Other | Admitting: Family Medicine

## 2013-12-27 VITALS — BP 114/76 | HR 75 | Temp 98.2°F | Wt 232.0 lb

## 2013-12-27 DIAGNOSIS — F3289 Other specified depressive episodes: Secondary | ICD-10-CM | POA: Diagnosis not present

## 2013-12-27 DIAGNOSIS — E871 Hypo-osmolality and hyponatremia: Secondary | ICD-10-CM | POA: Diagnosis not present

## 2013-12-27 DIAGNOSIS — E669 Obesity, unspecified: Secondary | ICD-10-CM | POA: Insufficient documentation

## 2013-12-27 DIAGNOSIS — F329 Major depressive disorder, single episode, unspecified: Secondary | ICD-10-CM | POA: Diagnosis not present

## 2013-12-27 DIAGNOSIS — I10 Essential (primary) hypertension: Secondary | ICD-10-CM

## 2013-12-27 DIAGNOSIS — M179 Osteoarthritis of knee, unspecified: Secondary | ICD-10-CM

## 2013-12-27 DIAGNOSIS — F32A Depression, unspecified: Secondary | ICD-10-CM

## 2013-12-27 DIAGNOSIS — E039 Hypothyroidism, unspecified: Secondary | ICD-10-CM | POA: Diagnosis not present

## 2013-12-27 DIAGNOSIS — M1710 Unilateral primary osteoarthritis, unspecified knee: Secondary | ICD-10-CM | POA: Diagnosis not present

## 2013-12-27 DIAGNOSIS — M810 Age-related osteoporosis without current pathological fracture: Secondary | ICD-10-CM

## 2013-12-27 DIAGNOSIS — E876 Hypokalemia: Secondary | ICD-10-CM

## 2013-12-27 DIAGNOSIS — IMO0002 Reserved for concepts with insufficient information to code with codable children: Secondary | ICD-10-CM

## 2013-12-27 LAB — BASIC METABOLIC PANEL
BUN: 12 mg/dL (ref 6–23)
CO2: 28 mEq/L (ref 19–32)
CREATININE: 0.8 mg/dL (ref 0.4–1.2)
Calcium: 9.3 mg/dL (ref 8.4–10.5)
Chloride: 95 mEq/L — ABNORMAL LOW (ref 96–112)
GFR: 80.81 mL/min (ref >60.00)
Glucose, Bld: 89 mg/dL (ref 70–99)
POTASSIUM: 3.4 meq/L — AB (ref 3.5–5.1)
Sodium: 132 mEq/L — ABNORMAL LOW (ref 135–145)

## 2013-12-27 LAB — T4, FREE: Free T4: 1.25 ng/dL (ref 0.60–1.60)

## 2013-12-27 LAB — T3, FREE: T3, Free: 2.6 pg/mL (ref 2.3–4.2)

## 2013-12-27 LAB — TSH: TSH: 0.71 u[IU]/mL (ref 0.35–5.50)

## 2013-12-27 MED ORDER — BUPROPION HCL ER (XL) 300 MG PO TB24: 300.0000 mg | ORAL_TABLET | Freq: Every day | ORAL | Status: DC

## 2013-12-27 MED ORDER — LISINOPRIL-HYDROCHLOROTHIAZIDE 10-12.5 MG PO TABS: 1.0000 | ORAL_TABLET | Freq: Every day | ORAL | Status: DC

## 2013-12-27 MED ORDER — TRAMADOL HCL 50 MG PO TABS: 50.0000 mg | ORAL_TABLET | Freq: Three times a day (TID) | ORAL | Status: DC | PRN

## 2013-12-27 MED ORDER — LEVOTHYROXINE SODIUM 88 MCG PO TABS: 88.0000 ug | ORAL_TABLET | Freq: Every day | ORAL | Status: DC

## 2013-12-27 NOTE — Assessment & Plan Note (Signed)
Calcium 1200-1500 mg daily and vita D3 1000 u daily bmd this summer

## 2013-12-27 NOTE — Patient Instructions (Signed)
Knee Pain Knee pain can be a result of an injury or other medical conditions. Treatment will depend on the cause of your pain. HOME CARE  Only take medicine as told by your doctor.  Keep a healthy weight. Being overweight can make the knee hurt more.  Stretch before exercising or playing sports.  If there is constant knee pain, change the way you exercise. Ask your doctor for advice.  Make sure shoes fit well. Choose the right shoe for the sport or activity.  Protect your knees. Wear kneepads if needed.  Rest when you are tired. GET HELP RIGHT AWAY IF:   Your knee pain does not stop.  Your knee pain does not get better.  Your knee joint feels hot to the touch.  You have a fever. MAKE SURE YOU:   Understand these instructions.  Will watch this condition.  Will get help right away if you are not doing well or get worse. Document Released: 03/06/2009 Document Revised: 03/01/2012 Document Reviewed: 03/06/2009 [employer] Patient Information 2014 [employer].  

## 2013-12-27 NOTE — Assessment & Plan Note (Signed)
Ultram prn per orders F/u ortho

## 2013-12-27 NOTE — Assessment & Plan Note (Signed)
Stable con't meds 

## 2013-12-27 NOTE — Progress Notes (Signed)
Pre visit review using our clinic review tool, if applicable. No additional management support is needed unless otherwise documented below in the visit note. 

## 2013-12-27 NOTE — Assessment & Plan Note (Signed)
Check labs Pt c/o hair thinning

## 2013-12-27 NOTE — Progress Notes (Signed)
  Subjective:    Patient here for follow-up of elevated blood pressure.  She is not exercising and is adherent to a low-salt diet.  Blood pressure is well controlled at home. Cardiac symptoms: none. Patient denies: chest pain, chest pressure/discomfort, claudication, dyspnea, exertional chest pressure/discomfort, fatigue, irregular heart beat, lower extremity edema, near-syncope, orthopnea, palpitations, paroxysmal nocturnal dyspnea, syncope and tachypnea. Cardiovascular risk factors: advanced age (older than 33 for men, 87 for women), dyslipidemia, hypertension, obesity (BMI >= 30 kg/m2) and sedentary lifestyle. Use of agents associated with hypertension: none. History of target organ damage: none.  Pt is struggling with pain in knees --OA --she has an appointment with ortho but they told her to not take NSAIDS because her wrist is not healing.    She also needs labs from ortho reviewed.  K and Na were slightly low and need to be repeated.  Vita D can be repeated at cpe.    The following portions of the patient's history were reviewed and updated as appropriate: allergies, current medications, past family history, past medical history, past social history, past surgical history and problem list.  Review of Systems Pertinent items are noted in HPI.     Objective:    BP 114/76  Pulse 75  Temp(Src) 98.2 F (36.8 C) (Oral)  Wt 232 lb (105.235 kg)  SpO2 97% General appearance: alert, cooperative, appears stated age and no distress Neck: no adenopathy, supple, symmetrical, trachea midline and thyroid not enlarged, symmetric, no tenderness/mass/nodules Lungs: clear to auscultation bilaterally Heart: S1, S2 normal Extremities: extremities normal, atraumatic, no cyanosis or edema    Assessment:    Hypertension, normal blood pressure . Evidence of target organ damage: none.    Plan:    Medication: no change. Dietary sodium restriction. Regular aerobic exercise. Check blood pressures 2-3  times weekly and record. Follow up: 3 months and as needed.

## 2013-12-28 MED ORDER — POTASSIUM CHLORIDE CRYS ER 20 MEQ PO TBCR: 20.0000 meq | EXTENDED_RELEASE_TABLET | Freq: Every day | ORAL | Status: DC

## 2014-01-05 ENCOUNTER — Telehealth: Payer: Self-pay | Admitting: *Deleted

## 2014-01-05 NOTE — Telephone Encounter (Signed)
con't current dose synthroid

## 2014-01-05 NOTE — Telephone Encounter (Signed)
Patient notified

## 2014-01-05 NOTE — Telephone Encounter (Signed)
Patient called and stated that she received her lab work but there was nothing that states if she should continue the synthroid. Patient states that the pharmacy has the medication on hold. Patient would like to know what she needs to do. Please advise. SW

## 2014-01-10 DIAGNOSIS — M1710 Unilateral primary osteoarthritis, unspecified knee: Secondary | ICD-10-CM | POA: Diagnosis not present

## 2014-01-20 ENCOUNTER — Telehealth: Payer: Self-pay | Admitting: Family Medicine

## 2014-01-25 NOTE — Telephone Encounter (Signed)
Relevant patient education mailed to patient.  

## 2014-02-08 ENCOUNTER — Telehealth: Payer: Self-pay

## 2014-02-08 NOTE — Telephone Encounter (Signed)
Patient rescheduled till March for CPE

## 2014-02-09 ENCOUNTER — Encounter: Payer: Medicare Other | Admitting: Family Medicine

## 2014-03-01 ENCOUNTER — Encounter: Payer: Medicare Other | Admitting: Family Medicine

## 2014-04-04 ENCOUNTER — Other Ambulatory Visit: Payer: Self-pay | Admitting: Family Medicine

## 2014-04-04 ENCOUNTER — Encounter: Payer: Self-pay | Admitting: Family Medicine

## 2014-04-04 ENCOUNTER — Ambulatory Visit (INDEPENDENT_AMBULATORY_CARE_PROVIDER_SITE_OTHER): Payer: Medicare Other | Admitting: Family Medicine

## 2014-04-04 VITALS — BP 130/84 | HR 80 | Temp 98.4°F | Ht 63.5 in | Wt 233.0 lb

## 2014-04-04 DIAGNOSIS — Z01818 Encounter for other preprocedural examination: Secondary | ICD-10-CM

## 2014-04-04 DIAGNOSIS — E876 Hypokalemia: Secondary | ICD-10-CM

## 2014-04-04 LAB — BASIC METABOLIC PANEL
BUN: 10 mg/dL (ref 6–23)
CHLORIDE: 97 meq/L (ref 96–112)
CO2: 26 meq/L (ref 19–32)
Calcium: 9.1 mg/dL (ref 8.4–10.5)
Creatinine, Ser: 0.7 mg/dL (ref 0.4–1.2)
GFR: 91.97 mL/min (ref >60.00)
Glucose, Bld: 84 mg/dL (ref 70–99)
Potassium: 3.5 mEq/L (ref 3.5–5.1)
Sodium: 132 mEq/L — ABNORMAL LOW (ref 135–145)

## 2014-04-04 MED ORDER — IRON 66 MG PO TABS: ORAL_TABLET | ORAL | Status: DC

## 2014-04-04 NOTE — Patient Instructions (Signed)
Total Knee Replacement Total knee replacement is a procedure to replace your knee joint with an artificial knee joint (prosthetic knee joint). The purpose of this surgery is to reduce pain and improve your knee function. LET YOUR CAREGIVER KNOW ABOUT:   Any allergies you have.  Any medicines you are taking, including vitamins, herbs, eyedrops, over-the-counter medicines, and creams.  Any problems you have had with the use of anesthetics.  Family history of problems with the use of anesthetics.  Any blood disorders you have, including bleeding problems or clotting problems.  Previous surgeries you have had. RISKS AND COMPLICATIONS  Generally, total knee replacement is a safe procedure. However, as with any surgical procedure, complications can occur. Possible complications associated with total knee replacement include:  Loss of range of motion of the knee or instability.  Loosening of the prosthesis.  Infection.  Persistent pain. BEFORE THE PROCEDURE   Your caregiver will instruct you when you need to stop eating and drinking.  Ask your caregiver if you need to change or stop any regular medicines. PROCEDURE  Just before the procedure you will receive medicine that will make you drowsy (sedative). This will be given through a tube that is inserted into one of your veins (intravenous [IV] tube). Then you will either receive medicine to block pain from the waist down through your legs (spinal block) or medicine to also receive medicine to make you fall asleep (general anesthetic). You may also receive medicine to block feeling in your leg (nerve block) to help ease pain after surgery. An incision will be made in your knee. Your surgeon will take out any damaged cartilage and bone by sawing off the damaged surfaces. Then the surgeon will put a new metal liner over the sawed off portion of your thigh bone (femur) and a plastic liner over the sawed off portion of one of the bones of your  lower leg (tibia). This is to restore alignment and function to your knee. A plastic piece is often used to restore the surface of your knee cap. AFTER THE PROCEDURE  You will be taken to the recovery area. You may have drainage tubes to drain excess fluid from your knee. These tubes attach to a device that removes these fluids. Once you are awake, stable, and taking fluids well, you will be taken to your hospital room. You will receive physical therapy as prescribed by your caregiver. The length of your stay in the hospital after a knee replacement is 2 4 days. Your surgeon may recommend that you spend time (usually an additional 10 14 days) in an extended-care facility to help you begin walking again and improve your range of motion before you go home. You may also be prescribed blood-thinning medicine to decrease your risk of developing blood clots in your leg. Document Released: 03/16/2001 Document Revised: 06/08/2012 Document Reviewed: 01/18/2012 [facility] Patient Information 2014 [employer].

## 2014-04-04 NOTE — Progress Notes (Signed)
Pre visit review using our clinic review tool, if applicable. No additional management support is needed unless otherwise documented below in the visit note. 

## 2014-04-04 NOTE — Progress Notes (Signed)
Subjective:    Kristina Ramos is a 73 y.o. female who presents to the office today for a preoperative consultation at the request of surgeon Dr Onnie Graham who plans on performing Left total knee replacement on May 5. This consultation is requested for the specific conditions prompting preoperative evaluation (i.e. because of potential affect on operative risk): hyperlipidemia, htn, and advanced age. Planned anesthesia: general. The patient has the following known anesthesia issues: none. Patients bleeding risk: no recent abnormal bleeding. Patient does not have objections to receiving blood products if needed.  The following portions of the patient's history were reviewed and updated as appropriate:  She  has a past medical history of Hypertension; Depression; Thyroid disease; and Hyperlipidemia. She  does not have any pertinent problems on file. She  has past surgical history that includes Knee surgery and Tubal ligation. Her family history includes Coronary artery disease in an other family member; Diabetes in her maternal grandmother and paternal aunt; Lung cancer in her father; Thyroid disease in an other family member. She  reports that she has never smoked. She has never used smokeless tobacco. She reports that she does not drink alcohol or use illicit drugs. She has a current medication list which includes the following prescription(s): bupropion, levothyroxine, lisinopril-hydrochlorothiazide, potassium chloride sa, and tramadol. Current Outpatient Prescriptions on File Prior to Visit  Medication Sig Dispense Refill  . buPROPion (WELLBUTRIN XL) 300 MG 24 hr tablet Take 1 tablet (300 mg total) by mouth daily.  90 tablet  3  . levothyroxine (SYNTHROID) 88 MCG tablet Take 1 tablet (88 mcg total) by mouth daily.  90 tablet  3  . lisinopril-hydrochlorothiazide (PRINZIDE,ZESTORETIC) 10-12.5 MG per tablet Take 1 tablet by mouth daily.  90 tablet  3  . potassium chloride SA (K-DUR,KLOR-CON) 20 MEQ  tablet Take 1 tablet (20 mEq total) by mouth daily.  30 tablet  2  . traMADol (ULTRAM) 50 MG tablet Take 1 tablet (50 mg total) by mouth every 8 (eight) hours as needed.  60 tablet  0   No current facility-administered medications on file prior to visit.   She is allergic to aspirin..  Review of Systems Pertinent items are noted in HPI.    Objective:    BP 130/84  Pulse 80  Temp(Src) 98.4 F (36.9 C) (Oral)  Ht 5' 3.5" (1.613 m)  Wt 233 lb (105.688 kg)  BMI 40.62 kg/m2  SpO2 95% General appearance: alert, cooperative, appears stated age and no distress Ears: normal TM's and external ear canals both ears Nose: Nares normal. Septum midline. Mucosa normal. No drainage or sinus tenderness. Throat: lips, mucosa, and tongue normal; teeth and gums normal Neck: no adenopathy, no carotid bruit, no JVD, supple, symmetrical, trachea midline and thyroid not enlarged, symmetric, no tenderness/mass/nodules Lungs: clear to auscultation bilaterally Extremities: edema L knee edema Skin: Skin color, texture, turgor normal. No rashes or lesions Lymph nodes: Cervical, supraclavicular, and axillary nodes normal.    Cardiographics ECG: normal sinus rhythm, no blocks or conduction defects, no ischemic changes Echocardiogram: not done  Imaging Chest x-ray: not done   Lab Review  Office Visit on 12/27/2013  Component Date Value  . Sodium 12/27/2013 132*  . Potassium 12/27/2013 3.4*  . Chloride 12/27/2013 95*  . CO2 12/27/2013 28   . Glucose, Bld 12/27/2013 89   . BUN 12/27/2013 12   . Creatinine, Ser 12/27/2013 0.8   . Calcium 12/27/2013 9.3   . GFR 12/27/2013 80.81   . TSH 12/27/2013  0.71   . T3, Free 12/27/2013 2.6   . Free T4 12/27/2013 1.25       Assessment:      73 y.o. female with planned surgery as above.   Known risk factors for perioperative complications: htn, hyperlipidemia   Difficulty with intubation is not anticipated.  Cardiac Risk Estimation: low  Current  medications which may produce withdrawal symptoms if withheld perioperatively: none      Plan:    1. Preoperative workup as follows ECG, and per surgical team 2. Change in medication regimen before surgery: none, continue medication regimen including morning of surgery, with sip of water. 3. Prophylaxis for cardiac events with perioperative beta-blockers: not indicated. 4. Invasive hemodynamic monitoring perioperatively: at the discretion of anesthesiologist. 5. Deep vein thrombosis prophylaxis postoperatively:regimen to be chosen by surgical team. 6. Surveillance for postoperative MI with ECG immediately postoperatively and on postoperative days 1 and 2 AND troponin levels 24 hours postoperatively and on day 4 or hospital discharge (whichever comes first): at the discretion of anesthesiologist. 7. Other measures: consult [facility] if needed

## 2014-04-05 ENCOUNTER — Telehealth: Payer: Self-pay | Admitting: *Deleted

## 2014-04-05 NOTE — Telephone Encounter (Signed)
Message copied by Harl Bowie on Wed Apr 05, 2014  2:38 PM ------      Message from: Rosalita Chessman      Created: Wed Apr 05, 2014  8:30 AM       Sodium is still low ---potassium is normal ------

## 2014-04-05 NOTE — Telephone Encounter (Signed)
LMOM @ (2:38pm) informing the pt of recent lab results and note below.  Informed the pt if she has any questions to please give [place] a call back.//AB/CMA

## 2014-04-10 DIAGNOSIS — M1710 Unilateral primary osteoarthritis, unspecified knee: Secondary | ICD-10-CM | POA: Diagnosis not present

## 2014-04-25 ENCOUNTER — Telehealth: Payer: Self-pay | Admitting: *Deleted

## 2014-04-25 DIAGNOSIS — M1909 Primary osteoarthritis, other specified site: Secondary | ICD-10-CM | POA: Diagnosis not present

## 2014-04-25 DIAGNOSIS — G8918 Other acute postprocedural pain: Secondary | ICD-10-CM | POA: Diagnosis not present

## 2014-04-25 DIAGNOSIS — S99919A Unspecified injury of unspecified ankle, initial encounter: Secondary | ICD-10-CM | POA: Diagnosis not present

## 2014-04-25 DIAGNOSIS — Z471 Aftercare following joint replacement surgery: Secondary | ICD-10-CM | POA: Diagnosis not present

## 2014-04-25 DIAGNOSIS — M1710 Unilateral primary osteoarthritis, unspecified knee: Secondary | ICD-10-CM | POA: Diagnosis not present

## 2014-04-25 DIAGNOSIS — S8990XA Unspecified injury of unspecified lower leg, initial encounter: Secondary | ICD-10-CM | POA: Diagnosis not present

## 2014-04-25 DIAGNOSIS — I10 Essential (primary) hypertension: Secondary | ICD-10-CM | POA: Diagnosis present

## 2014-04-25 DIAGNOSIS — Z96659 Presence of unspecified artificial knee joint: Secondary | ICD-10-CM | POA: Diagnosis not present

## 2014-04-25 DIAGNOSIS — K219 Gastro-esophageal reflux disease without esophagitis: Secondary | ICD-10-CM | POA: Diagnosis present

## 2014-04-25 DIAGNOSIS — D649 Anemia, unspecified: Secondary | ICD-10-CM | POA: Diagnosis present

## 2014-04-25 DIAGNOSIS — M25569 Pain in unspecified knee: Secondary | ICD-10-CM | POA: Diagnosis not present

## 2014-04-25 DIAGNOSIS — E039 Hypothyroidism, unspecified: Secondary | ICD-10-CM | POA: Diagnosis present

## 2014-04-25 HISTORY — PX: TOTAL KNEE ARTHROPLASTY: SHX125

## 2014-04-25 NOTE — Telephone Encounter (Signed)
SPoke with Mickel Baas at [facility] to verify that pt has received the pna vaccine. Verified that the patient received it in our office in 2011.

## 2014-04-26 DIAGNOSIS — G8918 Other acute postprocedural pain: Secondary | ICD-10-CM | POA: Diagnosis not present

## 2014-04-27 DIAGNOSIS — G8918 Other acute postprocedural pain: Secondary | ICD-10-CM | POA: Diagnosis not present

## 2014-04-28 DIAGNOSIS — F329 Major depressive disorder, single episode, unspecified: Secondary | ICD-10-CM | POA: Diagnosis not present

## 2014-04-28 DIAGNOSIS — M7989 Other specified soft tissue disorders: Secondary | ICD-10-CM | POA: Diagnosis not present

## 2014-04-28 DIAGNOSIS — I10 Essential (primary) hypertension: Secondary | ICD-10-CM | POA: Diagnosis not present

## 2014-04-28 DIAGNOSIS — Z471 Aftercare following joint replacement surgery: Secondary | ICD-10-CM | POA: Diagnosis not present

## 2014-04-28 DIAGNOSIS — Z96659 Presence of unspecified artificial knee joint: Secondary | ICD-10-CM | POA: Diagnosis not present

## 2014-04-28 DIAGNOSIS — S8990XA Unspecified injury of unspecified lower leg, initial encounter: Secondary | ICD-10-CM | POA: Diagnosis not present

## 2014-04-28 DIAGNOSIS — M25569 Pain in unspecified knee: Secondary | ICD-10-CM | POA: Diagnosis not present

## 2014-04-28 DIAGNOSIS — D649 Anemia, unspecified: Secondary | ICD-10-CM | POA: Diagnosis not present

## 2014-04-28 DIAGNOSIS — M159 Polyosteoarthritis, unspecified: Secondary | ICD-10-CM | POA: Diagnosis not present

## 2014-04-28 DIAGNOSIS — M1909 Primary osteoarthritis, other specified site: Secondary | ICD-10-CM | POA: Diagnosis not present

## 2014-04-28 DIAGNOSIS — S99929A Unspecified injury of unspecified foot, initial encounter: Secondary | ICD-10-CM | POA: Diagnosis not present

## 2014-04-28 DIAGNOSIS — F3289 Other specified depressive episodes: Secondary | ICD-10-CM | POA: Diagnosis not present

## 2014-04-28 DIAGNOSIS — E039 Hypothyroidism, unspecified: Secondary | ICD-10-CM | POA: Diagnosis not present

## 2014-05-01 DIAGNOSIS — D649 Anemia, unspecified: Secondary | ICD-10-CM | POA: Diagnosis not present

## 2014-05-01 DIAGNOSIS — F329 Major depressive disorder, single episode, unspecified: Secondary | ICD-10-CM | POA: Diagnosis not present

## 2014-05-01 DIAGNOSIS — F3289 Other specified depressive episodes: Secondary | ICD-10-CM | POA: Diagnosis not present

## 2014-05-01 DIAGNOSIS — M159 Polyosteoarthritis, unspecified: Secondary | ICD-10-CM | POA: Diagnosis not present

## 2014-05-01 DIAGNOSIS — I10 Essential (primary) hypertension: Secondary | ICD-10-CM | POA: Diagnosis not present

## 2014-05-02 DIAGNOSIS — M25569 Pain in unspecified knee: Secondary | ICD-10-CM | POA: Diagnosis not present

## 2014-05-05 ENCOUNTER — Telehealth: Payer: Self-pay | Admitting: Family Medicine

## 2014-05-05 NOTE — Telephone Encounter (Signed)
Caller name: Mariann Laster Relation to pt: [facility] Call back number:[phone]   Reason for call:    Mariann Laster called stating that pt is going home from [facility] and Mariann Laster needs to know if Dr. Etter Sjogren will continue to sign home health orders.  Contact wanda to advise.

## 2014-05-05 NOTE — Telephone Encounter (Signed)
Ok but she will need face to face ov

## 2014-05-08 DIAGNOSIS — Z96659 Presence of unspecified artificial knee joint: Secondary | ICD-10-CM | POA: Diagnosis not present

## 2014-05-08 DIAGNOSIS — Z471 Aftercare following joint replacement surgery: Secondary | ICD-10-CM | POA: Diagnosis not present

## 2014-05-09 DIAGNOSIS — Z96659 Presence of unspecified artificial knee joint: Secondary | ICD-10-CM | POA: Diagnosis not present

## 2014-05-09 DIAGNOSIS — Z471 Aftercare following joint replacement surgery: Secondary | ICD-10-CM | POA: Diagnosis not present

## 2014-05-10 DIAGNOSIS — Z96659 Presence of unspecified artificial knee joint: Secondary | ICD-10-CM | POA: Diagnosis not present

## 2014-05-10 DIAGNOSIS — Z471 Aftercare following joint replacement surgery: Secondary | ICD-10-CM | POA: Diagnosis not present

## 2014-05-10 NOTE — Telephone Encounter (Signed)
Tried calling the patient.  Patient line busy.  Will attempt to call again later.

## 2014-05-10 NOTE — Telephone Encounter (Signed)
Spoke with Mariann Laster from [facility],  She shared that patient went home over the weekend.  Orders needed were for Nursing, PT, and OT.

## 2014-05-11 DIAGNOSIS — Z96659 Presence of unspecified artificial knee joint: Secondary | ICD-10-CM | POA: Diagnosis not present

## 2014-05-11 DIAGNOSIS — Z471 Aftercare following joint replacement surgery: Secondary | ICD-10-CM | POA: Diagnosis not present

## 2014-05-12 ENCOUNTER — Encounter: Payer: Self-pay | Admitting: Family Medicine

## 2014-05-12 DIAGNOSIS — Z471 Aftercare following joint replacement surgery: Secondary | ICD-10-CM | POA: Diagnosis not present

## 2014-05-12 DIAGNOSIS — Z96659 Presence of unspecified artificial knee joint: Secondary | ICD-10-CM | POA: Diagnosis not present

## 2014-05-12 NOTE — Telephone Encounter (Signed)
Patient is currently at home.  She did go home over the weekend.  She is doing well after knee surgery (04/25/2014).  She does ambulate with a walker.  Currently receiving home health services to include nursing, PT, and OT.  She is unable to drive at this time, therefore transportation is an issue.  Patient is having to make transportation arrangements to follow up with her PCP.     Medication and allergies:  Reviewed and updated New medication:  Oxycodone 5-325 mg for pain and Aspirin 325 mg for prevention of blood clots.    90 day supply/mail order: n/a Local pharmacy:  [id number] - [place], [address] No changes to personal, family history or past surgical hx   To Discuss with Provider: Patient stopped taking her potassium chloride once she was told that her levels were normal.  Patient is unsure as to whether or not you would prefer she continue to take them.  Appointment scheduled:  05/19/14 @ 1130 am.

## 2014-05-15 DIAGNOSIS — Z471 Aftercare following joint replacement surgery: Secondary | ICD-10-CM | POA: Diagnosis not present

## 2014-05-15 DIAGNOSIS — Z96659 Presence of unspecified artificial knee joint: Secondary | ICD-10-CM | POA: Diagnosis not present

## 2014-05-16 DIAGNOSIS — Z471 Aftercare following joint replacement surgery: Secondary | ICD-10-CM | POA: Diagnosis not present

## 2014-05-16 DIAGNOSIS — Z96659 Presence of unspecified artificial knee joint: Secondary | ICD-10-CM | POA: Diagnosis not present

## 2014-05-17 DIAGNOSIS — Z96659 Presence of unspecified artificial knee joint: Secondary | ICD-10-CM | POA: Diagnosis not present

## 2014-05-17 DIAGNOSIS — Z471 Aftercare following joint replacement surgery: Secondary | ICD-10-CM | POA: Diagnosis not present

## 2014-05-19 ENCOUNTER — Ambulatory Visit (INDEPENDENT_AMBULATORY_CARE_PROVIDER_SITE_OTHER): Payer: Medicare Other | Admitting: Family Medicine

## 2014-05-19 ENCOUNTER — Encounter: Payer: Self-pay | Admitting: Family Medicine

## 2014-05-19 VITALS — BP 158/86 | HR 82 | Temp 98.0°F | Wt 227.0 lb

## 2014-05-19 DIAGNOSIS — I10 Essential (primary) hypertension: Secondary | ICD-10-CM | POA: Diagnosis not present

## 2014-05-19 DIAGNOSIS — M25569 Pain in unspecified knee: Secondary | ICD-10-CM | POA: Diagnosis not present

## 2014-05-19 DIAGNOSIS — Z96659 Presence of unspecified artificial knee joint: Secondary | ICD-10-CM

## 2014-05-19 DIAGNOSIS — M25562 Pain in left knee: Secondary | ICD-10-CM

## 2014-05-19 DIAGNOSIS — Z471 Aftercare following joint replacement surgery: Secondary | ICD-10-CM | POA: Diagnosis not present

## 2014-05-19 MED ORDER — AMOXICILLIN 500 MG PO CAPS: ORAL_CAPSULE | ORAL | Status: DC

## 2014-05-19 MED ORDER — OXYCODONE-ACETAMINOPHEN 5-325 MG PO TABS: 1.0000 | ORAL_TABLET | Freq: Four times a day (QID) | ORAL | Status: DC | PRN

## 2014-05-19 NOTE — Progress Notes (Signed)
Pre visit review using our clinic review tool, if applicable. No additional management support is needed unless otherwise documented below in the visit note. 

## 2014-05-19 NOTE — Progress Notes (Signed)
  Subjective:    Patient here for follow-up of elevated blood pressure.  She is exercising--- she is getting pt since she is s/p knee replacement and is adherent to a low-salt diet.  Blood pressure is not well controlled at home--since surgery. Cardiac symptoms: none. Patient denies: chest pain, chest pressure/discomfort, claudication, dyspnea, exertional chest pressure/discomfort, irregular heart beat, near-syncope, orthopnea, palpitations, paroxysmal nocturnal dyspnea, syncope and tachypnea. Cardiovascular risk factors: advanced age (older than 25 for men, 62 for women), hypertension and obesity (BMI >= 30 kg/m2). Use of agents associated with hypertension: none. History of target organ damage: none.  The following portions of the patient's history were reviewed and updated as appropriate: allergies, current medications, past family history, past medical history, past social history, past surgical history and problem list.  Review of Systems Pertinent items are noted in HPI.     Objective:    BP 158/86  Pulse 82  Temp(Src) 98 F (36.7 C) (Oral)  Wt 227 lb (102.967 kg)  SpO2 94% General appearance: alert, cooperative, appears stated age and no distress Lungs: clear to auscultation bilaterally Heart: S1, S2 normal Extremities: extremities normal, atraumatic, no cyanosis or edema    Assessment:    Hypertension, -- elevated today. Evidence of target organ damage: none.    Plan:    Medication: no change. Dietary sodium restriction. Regular aerobic exercise. Check blood pressures 2-3 times weekly and record. Follow up: 3 months and as needed.   1. Knee pain, left-- s/p knee replacement F/u ortho - oxyCODONE-acetaminophen (PERCOCET/ROXICET) 5-325 MG per tablet; Take 1 tablet by mouth every 6 (six) hours as needed for severe pain.  Dispense: 30 tablet; Refill: 0  2. S/P knee replacement   - amoxicillin (AMOXIL) 500 MG capsule; 4 tabs po 1 hr before procedure  Dispense: 4 capsule;  Refill: 5

## 2014-05-19 NOTE — Patient Instructions (Signed)
Take the pain meds prn and see if bp comes down.  I think it is up a little because you are in so much pain. rto 3 months or sooner prn

## 2014-05-22 DIAGNOSIS — Z96659 Presence of unspecified artificial knee joint: Secondary | ICD-10-CM | POA: Diagnosis not present

## 2014-05-22 DIAGNOSIS — Z471 Aftercare following joint replacement surgery: Secondary | ICD-10-CM | POA: Diagnosis not present

## 2014-05-23 DIAGNOSIS — Z471 Aftercare following joint replacement surgery: Secondary | ICD-10-CM | POA: Diagnosis not present

## 2014-05-23 DIAGNOSIS — Z96659 Presence of unspecified artificial knee joint: Secondary | ICD-10-CM | POA: Diagnosis not present

## 2014-05-24 DIAGNOSIS — Z471 Aftercare following joint replacement surgery: Secondary | ICD-10-CM | POA: Diagnosis not present

## 2014-05-24 DIAGNOSIS — Z96659 Presence of unspecified artificial knee joint: Secondary | ICD-10-CM | POA: Diagnosis not present

## 2014-05-26 DIAGNOSIS — Z96659 Presence of unspecified artificial knee joint: Secondary | ICD-10-CM | POA: Diagnosis not present

## 2014-05-26 DIAGNOSIS — Z471 Aftercare following joint replacement surgery: Secondary | ICD-10-CM | POA: Diagnosis not present

## 2014-05-29 DIAGNOSIS — Z96659 Presence of unspecified artificial knee joint: Secondary | ICD-10-CM | POA: Diagnosis not present

## 2014-05-29 DIAGNOSIS — Z471 Aftercare following joint replacement surgery: Secondary | ICD-10-CM | POA: Diagnosis not present

## 2014-06-01 DIAGNOSIS — M6281 Muscle weakness (generalized): Secondary | ICD-10-CM | POA: Diagnosis not present

## 2014-06-01 DIAGNOSIS — M25569 Pain in unspecified knee: Secondary | ICD-10-CM | POA: Diagnosis not present

## 2014-06-01 DIAGNOSIS — M25669 Stiffness of unspecified knee, not elsewhere classified: Secondary | ICD-10-CM | POA: Diagnosis not present

## 2014-06-02 DIAGNOSIS — M25669 Stiffness of unspecified knee, not elsewhere classified: Secondary | ICD-10-CM | POA: Diagnosis not present

## 2014-06-02 DIAGNOSIS — M6281 Muscle weakness (generalized): Secondary | ICD-10-CM | POA: Diagnosis not present

## 2014-06-02 DIAGNOSIS — M25569 Pain in unspecified knee: Secondary | ICD-10-CM | POA: Diagnosis not present

## 2014-06-05 DIAGNOSIS — M25669 Stiffness of unspecified knee, not elsewhere classified: Secondary | ICD-10-CM | POA: Diagnosis not present

## 2014-06-05 DIAGNOSIS — M6281 Muscle weakness (generalized): Secondary | ICD-10-CM | POA: Diagnosis not present

## 2014-06-05 DIAGNOSIS — M25569 Pain in unspecified knee: Secondary | ICD-10-CM | POA: Diagnosis not present

## 2014-06-07 DIAGNOSIS — M25569 Pain in unspecified knee: Secondary | ICD-10-CM | POA: Diagnosis not present

## 2014-06-09 DIAGNOSIS — M25569 Pain in unspecified knee: Secondary | ICD-10-CM | POA: Diagnosis not present

## 2014-06-09 DIAGNOSIS — M25669 Stiffness of unspecified knee, not elsewhere classified: Secondary | ICD-10-CM | POA: Diagnosis not present

## 2014-06-09 DIAGNOSIS — M6281 Muscle weakness (generalized): Secondary | ICD-10-CM | POA: Diagnosis not present

## 2014-06-14 DIAGNOSIS — M25669 Stiffness of unspecified knee, not elsewhere classified: Secondary | ICD-10-CM | POA: Diagnosis not present

## 2014-06-14 DIAGNOSIS — M6281 Muscle weakness (generalized): Secondary | ICD-10-CM | POA: Diagnosis not present

## 2014-06-14 DIAGNOSIS — M25569 Pain in unspecified knee: Secondary | ICD-10-CM | POA: Diagnosis not present

## 2014-06-19 DIAGNOSIS — M25669 Stiffness of unspecified knee, not elsewhere classified: Secondary | ICD-10-CM | POA: Diagnosis not present

## 2014-06-19 DIAGNOSIS — M6281 Muscle weakness (generalized): Secondary | ICD-10-CM | POA: Diagnosis not present

## 2014-06-19 DIAGNOSIS — M25569 Pain in unspecified knee: Secondary | ICD-10-CM | POA: Diagnosis not present

## 2014-06-21 DIAGNOSIS — M25669 Stiffness of unspecified knee, not elsewhere classified: Secondary | ICD-10-CM | POA: Diagnosis not present

## 2014-06-21 DIAGNOSIS — M6281 Muscle weakness (generalized): Secondary | ICD-10-CM | POA: Diagnosis not present

## 2014-06-21 DIAGNOSIS — M25569 Pain in unspecified knee: Secondary | ICD-10-CM | POA: Diagnosis not present

## 2014-06-26 DIAGNOSIS — M25669 Stiffness of unspecified knee, not elsewhere classified: Secondary | ICD-10-CM | POA: Diagnosis not present

## 2014-06-26 DIAGNOSIS — M6281 Muscle weakness (generalized): Secondary | ICD-10-CM | POA: Diagnosis not present

## 2014-06-26 DIAGNOSIS — M25569 Pain in unspecified knee: Secondary | ICD-10-CM | POA: Diagnosis not present

## 2014-06-29 DIAGNOSIS — M25569 Pain in unspecified knee: Secondary | ICD-10-CM | POA: Diagnosis not present

## 2014-06-29 DIAGNOSIS — M25669 Stiffness of unspecified knee, not elsewhere classified: Secondary | ICD-10-CM | POA: Diagnosis not present

## 2014-06-29 DIAGNOSIS — M6281 Muscle weakness (generalized): Secondary | ICD-10-CM | POA: Diagnosis not present

## 2014-07-03 DIAGNOSIS — M25669 Stiffness of unspecified knee, not elsewhere classified: Secondary | ICD-10-CM | POA: Diagnosis not present

## 2014-07-03 DIAGNOSIS — M25569 Pain in unspecified knee: Secondary | ICD-10-CM | POA: Diagnosis not present

## 2014-07-03 DIAGNOSIS — M6281 Muscle weakness (generalized): Secondary | ICD-10-CM | POA: Diagnosis not present

## 2014-07-06 DIAGNOSIS — M6281 Muscle weakness (generalized): Secondary | ICD-10-CM | POA: Diagnosis not present

## 2014-07-06 DIAGNOSIS — M25569 Pain in unspecified knee: Secondary | ICD-10-CM | POA: Diagnosis not present

## 2014-07-06 DIAGNOSIS — M25669 Stiffness of unspecified knee, not elsewhere classified: Secondary | ICD-10-CM | POA: Diagnosis not present

## 2014-07-10 DIAGNOSIS — M25669 Stiffness of unspecified knee, not elsewhere classified: Secondary | ICD-10-CM | POA: Diagnosis not present

## 2014-07-10 DIAGNOSIS — M6281 Muscle weakness (generalized): Secondary | ICD-10-CM | POA: Diagnosis not present

## 2014-07-10 DIAGNOSIS — M25569 Pain in unspecified knee: Secondary | ICD-10-CM | POA: Diagnosis not present

## 2014-07-13 DIAGNOSIS — M6281 Muscle weakness (generalized): Secondary | ICD-10-CM | POA: Diagnosis not present

## 2014-07-13 DIAGNOSIS — M25669 Stiffness of unspecified knee, not elsewhere classified: Secondary | ICD-10-CM | POA: Diagnosis not present

## 2014-07-13 DIAGNOSIS — M25569 Pain in unspecified knee: Secondary | ICD-10-CM | POA: Diagnosis not present

## 2014-07-17 DIAGNOSIS — M6281 Muscle weakness (generalized): Secondary | ICD-10-CM | POA: Diagnosis not present

## 2014-07-17 DIAGNOSIS — M25569 Pain in unspecified knee: Secondary | ICD-10-CM | POA: Diagnosis not present

## 2014-07-17 DIAGNOSIS — M25669 Stiffness of unspecified knee, not elsewhere classified: Secondary | ICD-10-CM | POA: Diagnosis not present

## 2014-07-20 DIAGNOSIS — M6281 Muscle weakness (generalized): Secondary | ICD-10-CM | POA: Diagnosis not present

## 2014-07-20 DIAGNOSIS — M25569 Pain in unspecified knee: Secondary | ICD-10-CM | POA: Diagnosis not present

## 2014-07-20 DIAGNOSIS — M25669 Stiffness of unspecified knee, not elsewhere classified: Secondary | ICD-10-CM | POA: Diagnosis not present

## 2014-07-24 DIAGNOSIS — M6281 Muscle weakness (generalized): Secondary | ICD-10-CM | POA: Diagnosis not present

## 2014-07-24 DIAGNOSIS — M25569 Pain in unspecified knee: Secondary | ICD-10-CM | POA: Diagnosis not present

## 2014-07-24 DIAGNOSIS — M25669 Stiffness of unspecified knee, not elsewhere classified: Secondary | ICD-10-CM | POA: Diagnosis not present

## 2014-07-27 DIAGNOSIS — M25569 Pain in unspecified knee: Secondary | ICD-10-CM | POA: Diagnosis not present

## 2014-07-27 DIAGNOSIS — M25669 Stiffness of unspecified knee, not elsewhere classified: Secondary | ICD-10-CM | POA: Diagnosis not present

## 2014-07-27 DIAGNOSIS — M6281 Muscle weakness (generalized): Secondary | ICD-10-CM | POA: Diagnosis not present

## 2014-07-31 DIAGNOSIS — M25569 Pain in unspecified knee: Secondary | ICD-10-CM | POA: Diagnosis not present

## 2014-07-31 DIAGNOSIS — M25669 Stiffness of unspecified knee, not elsewhere classified: Secondary | ICD-10-CM | POA: Diagnosis not present

## 2014-07-31 DIAGNOSIS — M6281 Muscle weakness (generalized): Secondary | ICD-10-CM | POA: Diagnosis not present

## 2014-08-03 DIAGNOSIS — M25669 Stiffness of unspecified knee, not elsewhere classified: Secondary | ICD-10-CM | POA: Diagnosis not present

## 2014-08-03 DIAGNOSIS — M25569 Pain in unspecified knee: Secondary | ICD-10-CM | POA: Diagnosis not present

## 2014-08-03 DIAGNOSIS — M6281 Muscle weakness (generalized): Secondary | ICD-10-CM | POA: Diagnosis not present

## 2015-01-02 ENCOUNTER — Ambulatory Visit (HOSPITAL_BASED_OUTPATIENT_CLINIC_OR_DEPARTMENT_OTHER)
Admission: RE | Admit: 2015-01-02 | Discharge: 2015-01-02 | Disposition: A | Discharge status: Home / Self Care | Payer: Medicare Other | Source: Ambulatory Visit | Attending: Medical | Admitting: Medical

## 2015-01-02 ENCOUNTER — Encounter: Payer: Self-pay | Admitting: Medical

## 2015-01-02 ENCOUNTER — Ambulatory Visit (INDEPENDENT_AMBULATORY_CARE_PROVIDER_SITE_OTHER): Payer: Medicare Other | Admitting: Medical

## 2015-01-02 VITALS — BP 144/72 | HR 81 | Temp 97.8°F | Ht 63.5 in | Wt 222.8 lb

## 2015-01-02 DIAGNOSIS — S299XXA Unspecified injury of thorax, initial encounter: Secondary | ICD-10-CM | POA: Diagnosis not present

## 2015-01-02 DIAGNOSIS — W19XXXA Unspecified fall, initial encounter: Secondary | ICD-10-CM | POA: Diagnosis not present

## 2015-01-02 DIAGNOSIS — M25562 Pain in left knee: Secondary | ICD-10-CM

## 2015-01-02 DIAGNOSIS — R0781 Pleurodynia: Secondary | ICD-10-CM

## 2015-01-02 DIAGNOSIS — M25552 Pain in left hip: Secondary | ICD-10-CM

## 2015-01-02 DIAGNOSIS — Z96652 Presence of left artificial knee joint: Secondary | ICD-10-CM | POA: Diagnosis not present

## 2015-01-02 DIAGNOSIS — M25551 Pain in right hip: Secondary | ICD-10-CM | POA: Diagnosis not present

## 2015-01-02 DIAGNOSIS — S8992XA Unspecified injury of left lower leg, initial encounter: Secondary | ICD-10-CM | POA: Diagnosis not present

## 2015-01-02 MED ORDER — TRAMADOL HCL 50 MG PO TABS: 50.0000 mg | ORAL_TABLET | Freq: Three times a day (TID) | ORAL | Status: DC | PRN

## 2015-01-02 NOTE — Assessment & Plan Note (Signed)
For your rib, hip and knee pain, we will get xrays today.  You can take ibuprofen(provided not fx) for pain and inflammation and tramadol for break through pain.  Follow up in 7-10 days or as needed.  Note sometimes bruised/contused ribs can hurt for weaks and produce pain similar to fracture

## 2015-01-02 NOTE — Patient Instructions (Signed)
For your rib, hip and knee pain, we will get xrays today.  You can take ibuprofen(provided not fx) for pain and inflammation and tramadol for break through pain.  Follow up in 7-10 days or as needed.  Note sometimes bruised/contused ribs can hurt for weaks and produce pain similar to fracture.

## 2015-01-02 NOTE — Progress Notes (Signed)
Pre visit review using our clinic review tool, if applicable. No additional management support is needed unless otherwise documented below in the visit note. 

## 2015-01-02 NOTE — Progress Notes (Deleted)
   Subjective:    Patient ID: Kristina Ramos, female    DOB: 1941/03/17, 73 y.o.   MRN: [id number]  HPI   Pt states this weekend. She slipped on wet porch. Buckled portion of deck that she  tripped on. She landed on her left side somehow. She also got hip bruise from the fall. Left lower ribs hurt the most. Hurt to breath and when twist thorax. Lt hip faint sore and has bruise. No loc. She speculates she may have bumped. Currently no ha, dizziness, blurred vision. No nausea, no vomting, no gross motor, and not sensory function deficit reported.    Review of Systems  Constitutional: Negative for fever, chills and fatigue.  HENT: Negative.   Respiratory: Negative for cough, choking, chest tightness, shortness of breath and wheezing.   Cardiovascular: Negative for chest pain and palpitations.  Gastrointestinal: Negative.   Musculoskeletal: Negative for back pain.       Lt rib, lt hip, lt knee pain.  Neurological: Negative for dizziness, weakness and headaches.  Hematological: Negative for adenopathy. Does not bruise/bleed easily.       Objective:   Physical Exam   General- No acute distress. Pleasant patient. Neck- Full range of motion, no jvd Lungs- Clear, even and unlabored. Heart- regular rate and rhythm. Neurologic Cranial Nerve exam:- CN III-XII intact(No nystagmus), symmetric smile. Drift Test:- No drift. Romberg Exam:- Negative.  Heal to Toe Gait exam:-Normal. Finger to Nose:- Normal/Intact Strength:- 5/5 equal and symmetric strength both upper and lower extremities.  Anterior thorax- lower ribs mid clavciular are very tender to touch and when she moves. Lt hip- pain on palpation and rom.   Lt knee- good Rom. No crepitus. Mild pain over tibial plateau of the knee.        Assessment & Plan:  I have comleted this note.

## 2015-01-24 DIAGNOSIS — E079 Disorder of thyroid, unspecified: Secondary | ICD-10-CM | POA: Diagnosis not present

## 2015-01-24 DIAGNOSIS — I10 Essential (primary) hypertension: Secondary | ICD-10-CM | POA: Diagnosis not present

## 2015-01-24 DIAGNOSIS — R52 Pain, unspecified: Secondary | ICD-10-CM | POA: Diagnosis not present

## 2015-01-24 DIAGNOSIS — W108XXA Fall (on) (from) other stairs and steps, initial encounter: Secondary | ICD-10-CM | POA: Diagnosis not present

## 2015-01-24 DIAGNOSIS — S42292A Other displaced fracture of upper end of left humerus, initial encounter for closed fracture: Secondary | ICD-10-CM | POA: Diagnosis not present

## 2015-01-24 DIAGNOSIS — S42202A Unspecified fracture of upper end of left humerus, initial encounter for closed fracture: Secondary | ICD-10-CM | POA: Diagnosis not present

## 2015-01-24 DIAGNOSIS — M25512 Pain in left shoulder: Secondary | ICD-10-CM | POA: Diagnosis not present

## 2015-01-24 DIAGNOSIS — Z79899 Other long term (current) drug therapy: Secondary | ICD-10-CM | POA: Diagnosis not present

## 2015-01-24 NOTE — Progress Notes (Signed)
   Subjective:    Patient ID: Kristina Ramos, female    DOB: 07/05/1941, 74 y.o.   MRN: [id number]  HPI    Pt states this weekend. She slipped on wet porch. Buckled portion of deck that she  tripped on. She landed on her left side somehow. She also got hip bruise from the fall. Left lower ribs hurt the most. Hurt to breath and when twist thorax. Lt hip faint sore and has bruise. No loc. She speculates she may have bumped. Currently no ha, dizziness, blurred vision. No nausea, no vomting, no gross motor, and not sensory function deficit reported.  Review of Systems  Constitutional: Negative for fever, chills and fatigue.  HENT: Negative.   Respiratory: Negative for cough, choking, chest tightness, shortness of breath and wheezing.   Cardiovascular: Negative for chest pain and palpitations.  Gastrointestinal: Negative.   Musculoskeletal: Negative for back pain.        Lt rib, lt hip, lt knee pain.  Neurological: Negative for dizziness, weakness and headaches.  Hematological: Negative for adenopathy. Does not bruise/bleed easily.      Objective:    Physical Exam  General- No acute distress. Pleasant patient. Neck- Full range of motion, no jvd Lungs- Clear, even and unlabored. Heart- regular rate and rhythm. Neurologic Cranial Nerve exam:- CN III-XII intact(No nystagmus), symmetric smile. Drift Test:- No drift. Romberg Exam:- Negative.   Heal to Toe Gait exam:-Normal. Finger to Nose:- Normal/Intact Strength:- 5/5 equal and symmetric strength both upper and lower extremities.   Anterior thorax- lower ribs mid clavciular are very tender to touch and when she moves. Lt hip- pain on palpation and rom.    Lt knee- good Rom. No crepitus. Mild pain over tibial plateau of the knee.        :                    Review of Systems     Objective:   Physical Exam        Assessment & Plan:

## 2015-01-31 DIAGNOSIS — Z6837 Body mass index (BMI) 37.0-37.9, adult: Secondary | ICD-10-CM | POA: Diagnosis not present

## 2015-01-31 DIAGNOSIS — S42295A Other nondisplaced fracture of upper end of left humerus, initial encounter for closed fracture: Secondary | ICD-10-CM | POA: Diagnosis not present

## 2015-02-20 DIAGNOSIS — S42295D Other nondisplaced fracture of upper end of left humerus, subsequent encounter for fracture with routine healing: Secondary | ICD-10-CM | POA: Diagnosis not present

## 2015-02-20 DIAGNOSIS — Z6837 Body mass index (BMI) 37.0-37.9, adult: Secondary | ICD-10-CM | POA: Diagnosis not present

## 2015-02-28 DIAGNOSIS — M6281 Muscle weakness (generalized): Secondary | ICD-10-CM | POA: Diagnosis not present

## 2015-02-28 DIAGNOSIS — M25512 Pain in left shoulder: Secondary | ICD-10-CM | POA: Diagnosis not present

## 2015-02-28 DIAGNOSIS — M25612 Stiffness of left shoulder, not elsewhere classified: Secondary | ICD-10-CM | POA: Diagnosis not present

## 2015-03-05 DIAGNOSIS — M6281 Muscle weakness (generalized): Secondary | ICD-10-CM | POA: Diagnosis not present

## 2015-03-05 DIAGNOSIS — M25512 Pain in left shoulder: Secondary | ICD-10-CM | POA: Diagnosis not present

## 2015-03-05 DIAGNOSIS — M25612 Stiffness of left shoulder, not elsewhere classified: Secondary | ICD-10-CM | POA: Diagnosis not present

## 2015-03-08 DIAGNOSIS — M25612 Stiffness of left shoulder, not elsewhere classified: Secondary | ICD-10-CM | POA: Diagnosis not present

## 2015-03-08 DIAGNOSIS — M25512 Pain in left shoulder: Secondary | ICD-10-CM | POA: Diagnosis not present

## 2015-03-08 DIAGNOSIS — M6281 Muscle weakness (generalized): Secondary | ICD-10-CM | POA: Diagnosis not present

## 2015-03-12 DIAGNOSIS — M25612 Stiffness of left shoulder, not elsewhere classified: Secondary | ICD-10-CM | POA: Diagnosis not present

## 2015-03-12 DIAGNOSIS — M25512 Pain in left shoulder: Secondary | ICD-10-CM | POA: Diagnosis not present

## 2015-03-12 DIAGNOSIS — M6281 Muscle weakness (generalized): Secondary | ICD-10-CM | POA: Diagnosis not present

## 2015-03-15 DIAGNOSIS — M6281 Muscle weakness (generalized): Secondary | ICD-10-CM | POA: Diagnosis not present

## 2015-03-15 DIAGNOSIS — M25612 Stiffness of left shoulder, not elsewhere classified: Secondary | ICD-10-CM | POA: Diagnosis not present

## 2015-03-15 DIAGNOSIS — M25512 Pain in left shoulder: Secondary | ICD-10-CM | POA: Diagnosis not present

## 2015-03-19 DIAGNOSIS — M6281 Muscle weakness (generalized): Secondary | ICD-10-CM | POA: Diagnosis not present

## 2015-03-19 DIAGNOSIS — M25512 Pain in left shoulder: Secondary | ICD-10-CM | POA: Diagnosis not present

## 2015-03-20 DIAGNOSIS — Z681 Body mass index (BMI) 19 or less, adult: Secondary | ICD-10-CM | POA: Diagnosis not present

## 2015-03-20 DIAGNOSIS — S42295D Other nondisplaced fracture of upper end of left humerus, subsequent encounter for fracture with routine healing: Secondary | ICD-10-CM | POA: Diagnosis not present

## 2015-03-21 ENCOUNTER — Other Ambulatory Visit: Payer: Self-pay | Admitting: Family Medicine

## 2015-03-22 DIAGNOSIS — M25612 Stiffness of left shoulder, not elsewhere classified: Secondary | ICD-10-CM | POA: Diagnosis not present

## 2015-03-22 DIAGNOSIS — M25512 Pain in left shoulder: Secondary | ICD-10-CM | POA: Diagnosis not present

## 2015-03-22 DIAGNOSIS — M6281 Muscle weakness (generalized): Secondary | ICD-10-CM | POA: Diagnosis not present

## 2015-03-26 DIAGNOSIS — M25512 Pain in left shoulder: Secondary | ICD-10-CM | POA: Diagnosis not present

## 2015-03-26 DIAGNOSIS — M6281 Muscle weakness (generalized): Secondary | ICD-10-CM | POA: Diagnosis not present

## 2015-03-26 DIAGNOSIS — M25612 Stiffness of left shoulder, not elsewhere classified: Secondary | ICD-10-CM | POA: Diagnosis not present

## 2015-03-29 DIAGNOSIS — M25612 Stiffness of left shoulder, not elsewhere classified: Secondary | ICD-10-CM | POA: Diagnosis not present

## 2015-03-29 DIAGNOSIS — M25512 Pain in left shoulder: Secondary | ICD-10-CM | POA: Diagnosis not present

## 2015-03-29 DIAGNOSIS — M6281 Muscle weakness (generalized): Secondary | ICD-10-CM | POA: Diagnosis not present

## 2015-04-02 DIAGNOSIS — M6281 Muscle weakness (generalized): Secondary | ICD-10-CM | POA: Diagnosis not present

## 2015-04-02 DIAGNOSIS — M25612 Stiffness of left shoulder, not elsewhere classified: Secondary | ICD-10-CM | POA: Diagnosis not present

## 2015-04-02 DIAGNOSIS — M25512 Pain in left shoulder: Secondary | ICD-10-CM | POA: Diagnosis not present

## 2015-04-05 DIAGNOSIS — M25512 Pain in left shoulder: Secondary | ICD-10-CM | POA: Diagnosis not present

## 2015-04-05 DIAGNOSIS — M25612 Stiffness of left shoulder, not elsewhere classified: Secondary | ICD-10-CM | POA: Diagnosis not present

## 2015-04-05 DIAGNOSIS — M6281 Muscle weakness (generalized): Secondary | ICD-10-CM | POA: Diagnosis not present

## 2015-04-09 DIAGNOSIS — M6281 Muscle weakness (generalized): Secondary | ICD-10-CM | POA: Diagnosis not present

## 2015-04-09 DIAGNOSIS — M25612 Stiffness of left shoulder, not elsewhere classified: Secondary | ICD-10-CM | POA: Diagnosis not present

## 2015-04-09 DIAGNOSIS — M25512 Pain in left shoulder: Secondary | ICD-10-CM | POA: Diagnosis not present

## 2015-04-12 DIAGNOSIS — M25512 Pain in left shoulder: Secondary | ICD-10-CM | POA: Diagnosis not present

## 2015-04-12 DIAGNOSIS — M6281 Muscle weakness (generalized): Secondary | ICD-10-CM | POA: Diagnosis not present

## 2015-04-12 DIAGNOSIS — M25612 Stiffness of left shoulder, not elsewhere classified: Secondary | ICD-10-CM | POA: Diagnosis not present

## 2015-04-24 DIAGNOSIS — S42295D Other nondisplaced fracture of upper end of left humerus, subsequent encounter for fracture with routine healing: Secondary | ICD-10-CM | POA: Diagnosis not present

## 2015-05-09 ENCOUNTER — Encounter: Payer: Self-pay | Admitting: Family Medicine

## 2015-05-09 ENCOUNTER — Ambulatory Visit (INDEPENDENT_AMBULATORY_CARE_PROVIDER_SITE_OTHER): Payer: Medicare Other | Admitting: Family Medicine

## 2015-05-09 VITALS — BP 130/80 | HR 90 | Temp 98.0°F | Resp 16 | Wt 227.4 lb

## 2015-05-09 DIAGNOSIS — B369 Superficial mycosis, unspecified: Secondary | ICD-10-CM | POA: Diagnosis not present

## 2015-05-09 MED ORDER — FLUCONAZOLE 100 MG PO TABS
100.0000 mg | ORAL_TABLET | Freq: Every day | ORAL | Status: DC
Start: 2015-05-09 — End: 2017-03-17

## 2015-05-09 MED ORDER — CLOTRIMAZOLE-BETAMETHASONE 1-0.05 % EX CREA: 1.0000 "application " | TOPICAL_CREAM | Freq: Two times a day (BID) | CUTANEOUS | Status: DC

## 2015-05-09 NOTE — Progress Notes (Signed)
   Subjective:    Patient ID: Kristina Ramos, female    DOB: [birth year], 73 y.o.   MRN: [id number]  HPI Rash- across bottom of stomach 'for months'.  Pt associates this w/ exercise and sweating but then broke her shoulder in Feb.  Pt has been treating w/ Gold Bond, Desitin, A&D, Aloe, Corn Starch.  + burning and itching, foul odor.   Review of Systems For ROS see HPI     Objective:   Physical Exam  Constitutional: She is oriented to person, place, and time. She appears well-developed and well-nourished. No distress.  HENT:  Head: Normocephalic and atraumatic.  Neurological: She is alert and oriented to person, place, and time.  Skin: Rash (pt w/ erythematous rash extending under pannus and into groin creases bilaterally w/ excoriations and oozing, macerated skin w/o evidence of superimposed infxn) noted. There is erythema.  Vitals reviewed.         Assessment & Plan:

## 2015-05-09 NOTE — Assessment & Plan Note (Signed)
New.  Pt's rash is severe and per report has been present x3 months.  Start both topical and oral antifungals.  Will start combo steroid w/ antifungal.  Reviewed supportive care and red flags that should prompt return.  Pt expressed understanding and is in agreement w/ plan.

## 2015-05-09 NOTE — Patient Instructions (Signed)
Follow up as needed Start the Lotrisone cream twice daily Take the Diflucan daily x7 days Try and keep area clean and dry Wear breathable fabrics like cotton Call with any questions or concerns Hang in there!!!

## 2015-05-09 NOTE — Progress Notes (Signed)
Pre visit review using our clinic review tool, if applicable. No additional management support is needed unless otherwise documented below in the visit note. 

## 2015-09-07 DIAGNOSIS — I10 Essential (primary) hypertension: Secondary | ICD-10-CM | POA: Diagnosis not present

## 2015-09-07 DIAGNOSIS — Z6839 Body mass index (BMI) 39.0-39.9, adult: Secondary | ICD-10-CM | POA: Diagnosis not present

## 2015-09-07 DIAGNOSIS — E559 Vitamin D deficiency, unspecified: Secondary | ICD-10-CM | POA: Diagnosis not present

## 2015-09-07 DIAGNOSIS — E039 Hypothyroidism, unspecified: Secondary | ICD-10-CM | POA: Diagnosis not present

## 2015-09-10 DIAGNOSIS — I10 Essential (primary) hypertension: Secondary | ICD-10-CM | POA: Diagnosis not present

## 2015-09-10 DIAGNOSIS — E559 Vitamin D deficiency, unspecified: Secondary | ICD-10-CM | POA: Diagnosis not present

## 2015-09-10 DIAGNOSIS — E039 Hypothyroidism, unspecified: Secondary | ICD-10-CM | POA: Diagnosis not present

## 2015-11-06 DIAGNOSIS — Z6839 Body mass index (BMI) 39.0-39.9, adult: Secondary | ICD-10-CM | POA: Diagnosis not present

## 2015-11-06 DIAGNOSIS — R011 Cardiac murmur, unspecified: Secondary | ICD-10-CM | POA: Diagnosis not present

## 2015-11-06 DIAGNOSIS — R51 Headache: Secondary | ICD-10-CM | POA: Diagnosis not present

## 2015-11-06 DIAGNOSIS — R05 Cough: Secondary | ICD-10-CM | POA: Diagnosis not present

## 2015-11-06 DIAGNOSIS — R0981 Nasal congestion: Secondary | ICD-10-CM | POA: Diagnosis not present

## 2015-12-18 ENCOUNTER — Telehealth: Payer: Self-pay

## 2015-12-18 NOTE — Telephone Encounter (Signed)
Called patient to scheduled CPE with provider patient stated she would call back in a couple of days

## 2016-05-18 DIAGNOSIS — M25552 Pain in left hip: Secondary | ICD-10-CM | POA: Diagnosis not present

## 2016-05-18 DIAGNOSIS — S7002XA Contusion of left hip, initial encounter: Secondary | ICD-10-CM | POA: Diagnosis not present

## 2016-06-27 DIAGNOSIS — M25551 Pain in right hip: Secondary | ICD-10-CM | POA: Diagnosis not present

## 2016-06-27 DIAGNOSIS — M8580 Other specified disorders of bone density and structure, unspecified site: Secondary | ICD-10-CM | POA: Diagnosis not present

## 2016-06-27 DIAGNOSIS — M7061 Trochanteric bursitis, right hip: Secondary | ICD-10-CM | POA: Diagnosis not present

## 2016-06-27 DIAGNOSIS — I10 Essential (primary) hypertension: Secondary | ICD-10-CM | POA: Diagnosis not present

## 2016-06-27 DIAGNOSIS — Z6839 Body mass index (BMI) 39.0-39.9, adult: Secondary | ICD-10-CM | POA: Diagnosis not present

## 2016-06-27 DIAGNOSIS — M899 Disorder of bone, unspecified: Secondary | ICD-10-CM | POA: Diagnosis not present

## 2016-06-27 DIAGNOSIS — E559 Vitamin D deficiency, unspecified: Secondary | ICD-10-CM | POA: Diagnosis not present

## 2016-06-27 DIAGNOSIS — M25552 Pain in left hip: Secondary | ICD-10-CM | POA: Diagnosis not present

## 2016-06-27 DIAGNOSIS — M7062 Trochanteric bursitis, left hip: Secondary | ICD-10-CM | POA: Diagnosis not present

## 2016-06-27 DIAGNOSIS — M949 Disorder of cartilage, unspecified: Secondary | ICD-10-CM | POA: Diagnosis not present

## 2016-07-18 DIAGNOSIS — M899 Disorder of bone, unspecified: Secondary | ICD-10-CM | POA: Diagnosis not present

## 2016-07-18 DIAGNOSIS — M8588 Other specified disorders of bone density and structure, other site: Secondary | ICD-10-CM | POA: Diagnosis not present

## 2016-07-18 DIAGNOSIS — E559 Vitamin D deficiency, unspecified: Secondary | ICD-10-CM | POA: Diagnosis not present

## 2016-07-18 DIAGNOSIS — M949 Disorder of cartilage, unspecified: Secondary | ICD-10-CM | POA: Diagnosis not present

## 2016-07-18 DIAGNOSIS — M8580 Other specified disorders of bone density and structure, unspecified site: Secondary | ICD-10-CM | POA: Diagnosis not present

## 2016-07-18 DIAGNOSIS — M810 Age-related osteoporosis without current pathological fracture: Secondary | ICD-10-CM | POA: Diagnosis not present

## 2016-07-18 LAB — HM DEXA SCAN

## 2016-08-22 ENCOUNTER — Other Ambulatory Visit: Payer: Self-pay

## 2016-12-03 ENCOUNTER — Telehealth: Payer: Self-pay | Admitting: Family Medicine

## 2016-12-03 NOTE — Telephone Encounter (Signed)
LVM advising patient to call back and schedule medicare wellness appointment.

## 2016-12-23 ENCOUNTER — Telehealth: Payer: Self-pay | Admitting: Family Medicine

## 2016-12-23 NOTE — Telephone Encounter (Signed)
Called patient to schedule awv. Left msg for patient to call office to schedule appt.  °

## 2017-01-27 ENCOUNTER — Telehealth: Payer: Self-pay | Admitting: Family Medicine

## 2017-01-27 NOTE — Telephone Encounter (Signed)
Patient scheduled AWV with PCP for 03/17/2017

## 2017-02-04 DIAGNOSIS — J029 Acute pharyngitis, unspecified: Secondary | ICD-10-CM | POA: Diagnosis not present

## 2017-02-04 DIAGNOSIS — J309 Allergic rhinitis, unspecified: Secondary | ICD-10-CM | POA: Diagnosis not present

## 2017-02-04 DIAGNOSIS — R05 Cough: Secondary | ICD-10-CM | POA: Diagnosis not present

## 2017-03-17 ENCOUNTER — Ambulatory Visit (INDEPENDENT_AMBULATORY_CARE_PROVIDER_SITE_OTHER): Payer: Medicare Other | Admitting: Family Medicine

## 2017-03-17 ENCOUNTER — Encounter: Payer: Self-pay | Admitting: Family Medicine

## 2017-03-17 VITALS — BP 138/71 | HR 75 | Temp 98.1°F | Resp 16 | Ht 63.7 in | Wt 230.6 lb

## 2017-03-17 DIAGNOSIS — E559 Vitamin D deficiency, unspecified: Secondary | ICD-10-CM | POA: Diagnosis not present

## 2017-03-17 DIAGNOSIS — I10 Essential (primary) hypertension: Secondary | ICD-10-CM | POA: Diagnosis not present

## 2017-03-17 DIAGNOSIS — E039 Hypothyroidism, unspecified: Secondary | ICD-10-CM

## 2017-03-17 DIAGNOSIS — Z0000 Encounter for general adult medical examination without abnormal findings: Secondary | ICD-10-CM

## 2017-03-17 DIAGNOSIS — Z23 Encounter for immunization: Secondary | ICD-10-CM

## 2017-03-17 DIAGNOSIS — E782 Mixed hyperlipidemia: Secondary | ICD-10-CM | POA: Diagnosis not present

## 2017-03-17 LAB — COMPREHENSIVE METABOLIC PANEL
ALT: 9 U/L (ref 0–35)
AST: 12 U/L (ref 0–37)
Albumin: 4.3 g/dL (ref 3.5–5.2)
Alkaline Phosphatase: 77 U/L (ref 39–117)
BILIRUBIN TOTAL: 0.5 mg/dL (ref 0.2–1.2)
BUN: 11 mg/dL (ref 6–23)
CALCIUM: 9.3 mg/dL (ref 8.4–10.5)
CO2: 28 meq/L (ref 19–32)
CREATININE: 0.71 mg/dL (ref 0.40–1.20)
Chloride: 98 mEq/L (ref 96–112)
GFR: 85.32 mL/min (ref >60.00)
Glucose, Bld: 99 mg/dL (ref 70–99)
Potassium: 4 mEq/L (ref 3.5–5.1)
Sodium: 134 mEq/L — ABNORMAL LOW (ref 135–145)
Total Protein: 6.8 g/dL (ref 6.0–8.3)

## 2017-03-17 LAB — LIPID PANEL
CHOL/HDL RATIO: 3
Cholesterol: 169 mg/dL (ref 0–200)
HDL: 50.5 mg/dL (ref >39.00)
LDL CALC: 109 mg/dL — AB (ref 0–99)
NonHDL: 118.93
TRIGLYCERIDES: 48 mg/dL (ref 0.0–149.0)
VLDL: 9.6 mg/dL (ref 0.0–40.0)

## 2017-03-17 LAB — VITAMIN D 25 HYDROXY (VIT D DEFICIENCY, FRACTURES): VITD: 22.91 ng/mL — ABNORMAL LOW (ref 30.00–100.00)

## 2017-03-17 LAB — TSH: TSH: 0.41 u[IU]/mL (ref 0.35–4.50)

## 2017-03-17 MED ORDER — ZOSTER VAC RECOMB ADJUVANTED 50 MCG/0.5ML IM SUSR: 0.5000 mL | Freq: Once | INTRAMUSCULAR | 1 refills | Status: AC

## 2017-03-17 MED ORDER — LISINOPRIL-HYDROCHLOROTHIAZIDE 10-12.5 MG PO TABS: 1.0000 | ORAL_TABLET | Freq: Every day | ORAL | 1 refills | Status: DC

## 2017-03-17 NOTE — Progress Notes (Signed)
Patient ID: Kristina Ramos, female   DOB: 1941-09-15, 76 y.o.   MRN: [id number]     Subjective:  I acted as a Education administrator for Dr. Carollee Herter.  Guerry Bruin, [place]   Patient ID: Kristina Ramos, female    DOB: 1941/10/27, 76 y.o.   MRN: [id number]  Chief Complaint  Patient presents with  . Hypertension  . Hyperlipidemia  . Hypothyroidism  . Medicare Wellness    w/ RN    HPI  Patient is in today for follow up blood pressure, thyroid, and cholesterol.  She has been doing well.   Patient Care Team: Ann Held, DO as PCP - General   Past Medical History:  Diagnosis Date  . Depression   . Hyperlipidemia   . Hypertension   . Thyroid disease     Past Surgical History:  Procedure Laterality Date  . KNEE SURGERY     Arthroscopy--left knee  . KNEE SURGERY  04/25/2014  . TUBAL LIGATION      Family History  Problem Relation Age of Onset  . Coronary artery disease    . Diabetes Maternal Grandmother   . Diabetes Paternal Aunt   . Lung cancer Father     smoker  . Thyroid disease      Social History   Social History  . Marital status: Married    Spouse name: N/A  . Number of children: N/A  . Years of education: N/A   Occupational History  . Not on file.   Social History Main Topics  . Smoking status: Never Smoker  . Smokeless tobacco: Never Used  . Alcohol use No  . Drug use: No  . Sexual activity: Not on file   Other Topics Concern  . Not on file   Social History Narrative  . No narrative on file    Outpatient Medications Prior to Visit  Medication Sig Dispense Refill  . levothyroxine (SYNTHROID) 88 MCG tablet Take 1 tablet (88 mcg total) by mouth daily. 90 tablet 3  . lisinopril-hydrochlorothiazide (PRINZIDE,ZESTORETIC) 10-12.5 MG per tablet Take 1 tablet by mouth daily.    Marland Kitchen amoxicillin (AMOXIL) 500 MG capsule 4 tabs po 1 hr before procedure 4 capsule 5  . buPROPion (WELLBUTRIN XL) 300 MG 24 hr tablet TAKE 1 TABLET (300 MG TOTAL) BY MOUTH DAILY. 90 tablet 1    . clotrimazole-betamethasone (LOTRISONE) cream Apply 1 application topically 2 (two) times daily. 30 g 0  . fluconazole (DIFLUCAN) 100 MG tablet Take 1 tablet (100 mg total) by mouth daily. 7 tablet 0  . potassium chloride SA (K-DUR,KLOR-CON) 20 MEQ tablet Take 1 tablet (20 mEq total) by mouth daily. (Patient not taking: Reported on 01/02/2015) 30 tablet 2   No facility-administered medications prior to visit.     Allergies  Allergen Reactions  . Aspirin     REACTION: pt states that it irritates her stomach    Review of Systems  Constitutional: Negative for fever and malaise/fatigue.  HENT: Negative for congestion.   Eyes: Negative for blurred vision.  Respiratory: Negative for cough and shortness of breath.   Cardiovascular: Negative for chest pain, palpitations and leg swelling.  Gastrointestinal: Negative for vomiting.  Musculoskeletal: Negative for back pain.  Skin: Negative for rash.  Neurological: Negative for loss of consciousness and headaches.       Objective:    Physical Exam  Constitutional: She is oriented to person, place, and time. She appears well-developed and well-nourished. No distress.  HENT:  Head: Normocephalic  and atraumatic.  Eyes: Conjunctivae are normal.  Neck: Normal range of motion. No thyromegaly present.  Cardiovascular: Normal rate and regular rhythm.   Pulmonary/Chest: Effort normal and breath sounds normal. She has no wheezes.  Abdominal: Soft. Bowel sounds are normal. There is no tenderness.  Musculoskeletal: Normal range of motion. She exhibits no edema or deformity.  Neurological: She is alert and oriented to person, place, and time.  Skin: Skin is warm and dry. She is not diaphoretic.  Psychiatric: She has a normal mood and affect.    BP 138/71 (BP Location: Left Arm, Cuff Size: Large)   Pulse 75   Temp 98.1 F (36.7 C) (Oral)   Resp 16   Ht 5' 3.7" (1.618 m)   Wt 230 lb 9.6 oz (104.6 kg)   SpO2 98%   BMI 39.96 kg/m  Wt  Readings from Last 3 Encounters:  03/17/17 230 lb 9.6 oz (104.6 kg)  05/09/15 227 lb 6 oz (103.1 kg)  01/02/15 222 lb 12.8 oz (101.1 kg)   BP Readings from Last 3 Encounters:  03/17/17 138/71  05/09/15 130/80  01/02/15 (!) 144/72     Immunization History  Administered Date(s) Administered  . Pneumococcal Conjugate-13 03/17/2017  . Pneumococcal Polysaccharide-23 02/27/2010  . Td 03/23/2008    Health Maintenance  Topic Date Due  . COLONOSCOPY  04/27/1992  . DEXA SCAN  04/28/2007  . PNA vac Low Risk Adult (2 of 2 - PCV13) 02/28/2011  . MAMMOGRAM  08/11/2014  . INFLUENZA VACCINE  03/21/2017 (Originally 07/22/2016)  . TETANUS/TDAP  03/23/2018    Lab Results  Component Value Date   WBC 5.6 08/14/2011   HGB 13.8 08/14/2011   HCT 41.3 08/14/2011   PLT 283.0 08/14/2011   GLUCOSE 84 04/04/2014   CHOL 199 08/14/2011   TRIG 61.0 08/14/2011   HDL 64.20 08/14/2011   LDLCALC 123 (H) 08/14/2011   ALT 14 08/14/2011   AST 15 08/14/2011   NA 132 (L) 04/04/2014   K 3.5 04/04/2014   CL 97 04/04/2014   CREATININE 0.7 04/04/2014   BUN 10 04/04/2014   CO2 26 04/04/2014   TSH 0.71 12/27/2013    Lab Results  Component Value Date   TSH 0.71 12/27/2013   Lab Results  Component Value Date   WBC 5.6 08/14/2011   HGB 13.8 08/14/2011   HCT 41.3 08/14/2011   MCV 90.4 08/14/2011   PLT 283.0 08/14/2011   Lab Results  Component Value Date   NA 132 (L) 04/04/2014   K 3.5 04/04/2014   CO2 26 04/04/2014   GLUCOSE 84 04/04/2014   BUN 10 04/04/2014   CREATININE 0.7 04/04/2014   BILITOT 0.6 08/14/2011   ALKPHOS 57 08/14/2011   AST 15 08/14/2011   ALT 14 08/14/2011   PROT 6.9 08/14/2011   ALBUMIN 4.4 08/14/2011   CALCIUM 9.1 04/04/2014   GFR 91.97 04/04/2014   Lab Results  Component Value Date   CHOL 199 08/14/2011   Lab Results  Component Value Date   HDL 64.20 08/14/2011   Lab Results  Component Value Date   LDLCALC 123 (H) 08/14/2011   Lab Results  Component Value  Date   TRIG 61.0 08/14/2011   Lab Results  Component Value Date   CHOLHDL 3 08/14/2011   No results found for: HGBA1C       Assessment & Plan:   Problem List Items Addressed This Visit      Unprioritized   Essential hypertension, benign  Well controlled, no changes to meds. Encouraged heart healthy diet such as the DASH diet and exercise as tolerated.        Relevant Medications   lisinopril-hydrochlorothiazide (PRINZIDE,ZESTORETIC) 10-12.5 MG tablet   Other Relevant Orders   Comprehensive metabolic panel   HYPERLIPIDEMIA    Encouraged heart healthy diet, increase exercise, avoid trans fats, consider a krill oil cap daily      Relevant Medications   lisinopril-hydrochlorothiazide (PRINZIDE,ZESTORETIC) 10-12.5 MG tablet   Other Relevant Orders   Lipid panel   Hypothyroidism    con't synthroid Check TSH       Relevant Orders   TSH    Other Visit Diagnoses    Encounter for Medicare annual wellness exam    -  Primary   Need for pneumococcal vaccination       Relevant Orders   Pneumococcal conjugate vaccine 13-valent (Completed)      I have discontinued Ms. Hughston's potassium chloride SA, amoxicillin, buPROPion, clotrimazole-betamethasone, and fluconazole. I have also changed her lisinopril-hydrochlorothiazide. Additionally, I am having her start on Zoster Vac Recomb Adjuvanted. Lastly, I am having her maintain her levothyroxine.  Meds ordered this encounter  Medications  . lisinopril-hydrochlorothiazide (PRINZIDE,ZESTORETIC) 10-12.5 MG tablet    Sig: Take 1 tablet by mouth daily.    Dispense:  90 tablet    Refill:  1  . Zoster Vac Recomb Adjuvanted [facility]) injection    Sig: Inject 0.5 mLs into the muscle once.    Dispense:  1 each    Refill:  1    CMA served as scribe during this visit. History, Physical and Plan performed by medical provider. Documentation and orders reviewed and attested to.  Ann Held, DO

## 2017-03-17 NOTE — Patient Instructions (Addendum)
Bring a copy of your advance directives to your next office visit.  Preventive Care 65 Years and Older, Female Preventive care refers to lifestyle choices and visits with your health care provider that can promote health and wellness. What does preventive care include?  A yearly physical exam. This is also called an annual well check.  Dental exams once or twice a year.  Routine eye exams. Ask your health care provider how often you should have your eyes checked.  Personal lifestyle choices, including: ? Daily care of your teeth and gums. ? Regular physical activity. ? Eating a healthy diet. ? Avoiding tobacco and drug use. ? Limiting alcohol use. ? Practicing safe sex. ? Taking low-dose aspirin every day. ? Taking vitamin and mineral supplements as recommended by your health care provider. What happens during an annual well check? The services and screenings done by your health care provider during your annual well check will depend on your age, overall health, lifestyle risk factors, and family history of disease. Counseling Your health care provider may ask you questions about your:  Alcohol use.  Tobacco use.  Drug use.  Emotional well-being.  Home and relationship well-being.  Sexual activity.  Eating habits.  History of falls.  Memory and ability to understand (cognition).  Work and work environment.  Reproductive health.  Screening You may have the following tests or measurements:  Height, weight, and BMI.  Blood pressure.  Lipid and cholesterol levels. These may be checked every 5 years, or more frequently if you are over 50 years old.  Skin check.  Lung cancer screening. You may have this screening every year starting at age 55 if you have a 30-pack-year history of smoking and currently smoke or have quit within the past 15 years.  Fecal occult blood test (FOBT) of the stool. You may have this test every year starting at age 50.  Flexible  sigmoidoscopy or colonoscopy. You may have a sigmoidoscopy every 5 years or a colonoscopy every 10 years starting at age 50.  Hepatitis C blood test.  Hepatitis B blood test.  Sexually transmitted disease (STD) testing.  Diabetes screening. This is done by checking your blood sugar (glucose) after you have not eaten for a while (fasting). You may have this done every 1-3 years.  Bone density scan. This is done to screen for osteoporosis. You may have this done starting at age 65.  Mammogram. This may be done every 1-2 years. Talk to your health care provider about how often you should have regular mammograms.  Talk with your health care provider about your test results, treatment options, and if necessary, the need for more tests. Vaccines Your health care provider may recommend certain vaccines, such as:  Influenza vaccine. This is recommended every year.  Tetanus, diphtheria, and acellular pertussis (Tdap, Td) vaccine. You may need a Td booster every 10 years.  Varicella vaccine. You may need this if you have not been vaccinated.  Zoster vaccine. You may need this after age 60.  Measles, mumps, and rubella (MMR) vaccine. You may need at least one dose of MMR if you were born in 1957 or later. You may also need a second dose.  Pneumococcal 13-valent conjugate (PCV13) vaccine. One dose is recommended after age 65.  Pneumococcal polysaccharide (PPSV23) vaccine. One dose is recommended after age 65.  Meningococcal vaccine. You may need this if you have certain conditions.  Hepatitis A vaccine. You may need this if you have certain conditions or if   you travel or work in places where you may be exposed to hepatitis A.  Hepatitis B vaccine. You may need this if you have certain conditions or if you travel or work in places where you may be exposed to hepatitis B.  Haemophilus influenzae type b (Hib) vaccine. You may need this if you have certain conditions.  Talk to your health  care provider about which screenings and vaccines you need and how often you need them. This information is not intended to replace advice given to you by your health care provider. Make sure you discuss any questions you have with your health care provider. Document Released: 01/04/2016 Document Revised: 08/27/2016 Document Reviewed: 10/09/2015 Elsevier Interactive Patient Education  2017 [employer]  

## 2017-03-17 NOTE — Progress Notes (Addendum)
Subjective:   Kristina Ramos is a 76 y.o. female who presents for an Initial Medicare Annual Wellness Visit.  The Patient was informed that the wellness visit is to identify future health risk and educate and initiate measures that can reduce risk for increased disease through the lifespan.   Describes health as fair, good or great? "Pretty good."  Review of Systems     No ROS.  Medicare Wellness Visit.  Cardiac Risk Factors include: advanced age (>25mn, >>8women);obesity (BMI >30kg/m2)  Sleep patterns: no sleep issues, feels rested on waking, gets up 0-1 times nightly to void and sleeps 5-6 hours nightly.   Home Safety/Smoke Alarms: Feels safe in home. Smoke alarms in place.    Living environment; residence and Firearm Safety: Lives alone. number of outside stairs: 2, no firearms. Seat Belt Safety/Bike Helmet: Wears seat belt.   Counseling:   Eye Exam- Does not follow w/ eye doctor currently but is planning to arrange a routine appointment with her daughter's eye doctor.  Dental- Dr PWindy Carinain JHoffmanquarterly and PRN.   Female:   Pap- Aged out.     Mammo- last 08/11/13. BI-RADS CATEGORY 1:  Negative.       Dexa scan- last 07/18/16. Osteopenia.        CCS- Not on file. Patient plans to proceed with Cologuard testing.     Objective:    Today's Vitals   03/17/17 1341  BP: 138/71  Pulse: 75  Resp: 16  Temp: 98.1 F (36.7 C)  TempSrc: Oral  SpO2: 98%  Weight: 230 lb 9.6 oz (104.6 kg)  Height: 5' 3.7" (1.618 m)   Body mass index is 39.96 kg/m.  Wt Readings from Last 3 Encounters:  03/17/17 230 lb 9.6 oz (104.6 kg)  05/09/15 227 lb 6 oz (103.1 kg)  01/02/15 222 lb 12.8 oz (101.1 kg)   Current Medications (verified) Outpatient Encounter Prescriptions as of 03/17/2017  Medication Sig  . levothyroxine (SYNTHROID) 88 MCG tablet Take 1 tablet (88 mcg total) by mouth daily.  .Marland Kitchenlisinopril-hydrochlorothiazide (PRINZIDE,ZESTORETIC) 10-12.5 MG tablet Take 1 tablet by  mouth daily.  . [DISCONTINUED] lisinopril-hydrochlorothiazide (PRINZIDE,ZESTORETIC) 10-12.5 MG per tablet Take 1 tablet by mouth daily.  .Marland KitchenZoster Vac Recomb Adjuvanted ([facility] injection Inject 0.5 mLs into the muscle once.  . [DISCONTINUED] amoxicillin (AMOXIL) 500 MG capsule 4 tabs po 1 hr before procedure  . [DISCONTINUED] buPROPion (WELLBUTRIN XL) 300 MG 24 hr tablet TAKE 1 TABLET (300 MG TOTAL) BY MOUTH DAILY.  . [DISCONTINUED] clotrimazole-betamethasone (LOTRISONE) cream Apply 1 application topically 2 (two) times daily.  . [DISCONTINUED] fluconazole (DIFLUCAN) 100 MG tablet Take 1 tablet (100 mg total) by mouth daily.  . [DISCONTINUED] potassium chloride SA (K-DUR,KLOR-CON) 20 MEQ tablet Take 1 tablet (20 mEq total) by mouth daily. (Patient not taking: Reported on 01/02/2015)   No facility-administered encounter medications on file as of 03/17/2017.     Allergies (verified) Aspirin   History: Past Medical History:  Diagnosis Date  . Depression   . Hyperlipidemia   . Hypertension   . Thyroid disease    Past Surgical History:  Procedure Laterality Date  . KNEE SURGERY     Arthroscopy--left knee  . KNEE SURGERY  04/25/2014  . TUBAL LIGATION     Family History  Problem Relation Age of Onset  . Coronary artery disease    . Diabetes Maternal Grandmother   . Diabetes Paternal Aunt   . Lung cancer Father     smoker  .  Thyroid disease     Social History   Occupational History  . Not on file.   Social History Main Topics  . Smoking status: Never Smoker  . Smokeless tobacco: Never Used  . Alcohol use No  . Drug use: No  . Sexual activity: Not on file    Tobacco Counseling Counseling given: Not Answered   Activities of Daily Living In your present state of health, do you have any difficulty performing the following activities: 03/17/2017  Hearing? N  Vision? N  Difficulty concentrating or making decisions? N  Walking or climbing stairs? N  Dressing or bathing? N    Doing errands, shopping? N  Preparing Food and eating ? N  Using the Toilet? N  In the past six months, have you accidently leaked urine? N  Do you have problems with loss of bowel control? N  Managing your Medications? N  Managing your Finances? N  Housekeeping or managing your Housekeeping? N  Some recent data might be hidden    Immunizations and Health Maintenance Immunization History  Administered Date(s) Administered  . Pneumococcal Conjugate-13 03/17/2017  . Pneumococcal Polysaccharide-23 02/27/2010  . Td 03/23/2008   Health Maintenance Due  Topic Date Due  . COLONOSCOPY  04/27/1992  . MAMMOGRAM  08/11/2014    Patient Care Team: Ann Held, DO as PCP - General  Indicate any recent Medical Services you may have received from other than [facility] providers in the past year (date may be approximate).     Assessment:   This is a routine wellness examination for Kristina Ramos. Physical assessment deferred to PCP.  Hearing/Vision screen Hearing Screening Comments: Able to hear conversational tones w/o difficulty. No issues reported. Passes whisper test. Vision Screening Comments: Wears reading glasses. Does not follow w/ eye doctor regularly, but is planning to schedule an appointment for a routine vision screening. No vision issues reported.  Dietary issues and exercise activities discussed: Current Exercise Habits: Structured exercise class, Type of exercise: Other - see comments;stretching ([employer]), Intensity: Moderate  Diet (meal preparation, eat out, water intake, caffeinated beverages, dairy products, fruits and vegetables): in general, a "healthy" diet  , well balanced, on average, 2-3 meals per day. Meals vary. Combination of eating at home and eating out.      Goals    . Increase physical activity      Depression Screen PHQ 2/9 Scores 03/17/2017 05/09/2015 12/27/2013  PHQ - 2 Score 0 0 1    Fall Risk Fall Risk  03/17/2017 08/22/2016 05/09/2015 12/27/2013  Falls in the  past year? Yes Yes No No  Number falls in past yr: 1 1 - -  Injury with Fall? No Yes - -    Cognitive Function:       Ad8 score reviewed for issues:  Issues making decisions: No  Less interest in hobbies / activities: No  Repeats questions, stories (family complaining): No  Trouble using ordinary gadgets (microwave, computer, phone): No  Forgets the month or year: No  Mismanaging finances: No  Remembering appts: No  Daily problems with thinking and/or memory: No Ad8 score is= 0     Screening Tests Health Maintenance  Topic Date Due  . COLONOSCOPY  04/27/1992  . MAMMOGRAM  08/11/2014  . INFLUENZA VACCINE  03/21/2017 (Originally 07/22/2016)  . TETANUS/TDAP  03/23/2018  . DEXA SCAN  Completed  . PNA vac Low Risk Adult  Completed      Plan:    Follow-up w/ PCP as scheduled.  Cologuard ordered per PCP.  DEXA completed w/ [facility], shows osteopenia. Vitamin D ordered w/ today's labs per PCP. Discussed importance of adequate dietary calcium intake and regular weight bearing exercise.  Patient would like to schedule MMG at her convenience and will call the office if orders are needed.  During the course of the visit, Kristina Ramos was educated and counseled about the following appropriate screening and preventive services:   Vaccines to include Pneumoccal, Influenza, Td, HCV  Cardiovascular disease screening  Colorectal cancer screening  Bone density screening  Diabetes screening  Glaucoma screening  Mammography  Nutrition counseling  Patient Instructions (the written plan) were given to the patient.    Dorrene German, RN   03/17/2017

## 2017-03-17 NOTE — Assessment & Plan Note (Signed)
Well controlled, no changes to meds. Encouraged heart healthy diet such as the DASH diet and exercise as tolerated.  °

## 2017-03-17 NOTE — Assessment & Plan Note (Signed)
con't synthroid Check TSH

## 2017-03-17 NOTE — Progress Notes (Signed)
Pre visit review using our clinic review tool, if applicable. No additional management support is needed unless otherwise documented below in the visit note. 

## 2017-03-17 NOTE — Assessment & Plan Note (Signed)
Encouraged heart healthy diet, increase exercise, avoid trans fats, consider a krill oil cap daily 

## 2017-03-18 ENCOUNTER — Other Ambulatory Visit: Payer: Self-pay | Admitting: Family Medicine

## 2017-03-18 DIAGNOSIS — E559 Vitamin D deficiency, unspecified: Secondary | ICD-10-CM

## 2017-03-18 DIAGNOSIS — E785 Hyperlipidemia, unspecified: Secondary | ICD-10-CM

## 2017-03-18 MED ORDER — VITAMIN D (ERGOCALCIFEROL) 1.25 MG (50000 UNIT) PO CAPS: 50000.0000 [IU] | ORAL_CAPSULE | ORAL | 2 refills | Status: DC

## 2017-03-18 MED ORDER — LEVOTHYROXINE SODIUM 88 MCG PO TABS: 88.0000 ug | ORAL_TABLET | Freq: Every day | ORAL | 3 refills | Status: DC

## 2017-03-26 ENCOUNTER — Encounter: Payer: Self-pay | Admitting: Family Medicine

## 2017-03-26 DIAGNOSIS — Z1211 Encounter for screening for malignant neoplasm of colon: Secondary | ICD-10-CM | POA: Diagnosis not present

## 2017-03-26 DIAGNOSIS — Z1212 Encounter for screening for malignant neoplasm of rectum: Secondary | ICD-10-CM | POA: Diagnosis not present

## 2017-03-26 LAB — COLOGUARD: Cologuard: POSITIVE

## 2017-04-09 LAB — COLOGUARD

## 2017-04-10 ENCOUNTER — Telehealth: Payer: Self-pay | Admitting: Family Medicine

## 2017-04-10 DIAGNOSIS — R195 Other fecal abnormalities: Secondary | ICD-10-CM

## 2017-04-10 NOTE — Telephone Encounter (Signed)
Caller name:  Andee Poles  Relation to pt: Medical illustrator back number: provider support [phone] #[phone] #2   Reason for call:  Checking on the status if coluguard abnormal results were received, re faxing to [phone].

## 2017-04-10 NOTE — Telephone Encounter (Signed)
Received faxed results and a call from [employer] for cologuard. It was positive.

## 2017-04-12 NOTE — Telephone Encounter (Signed)
Referal to gi placed.

## 2017-04-12 NOTE — Telephone Encounter (Signed)
Pt will need GI referral--- let pt know it is positive

## 2017-04-13 NOTE — Telephone Encounter (Signed)
Left message on machine to call back.  She needs to be notified that her cologuard was positive.Marland Kitchen

## 2017-04-14 NOTE — Telephone Encounter (Signed)
Patient notified.  She want to talk to gi first because she just thinks she may have a growth in her rectum.  I advised her that she make the appt.  They usually consult first before doing anything.  She will call and schedule.

## 2017-05-04 ENCOUNTER — Ambulatory Visit (INDEPENDENT_AMBULATORY_CARE_PROVIDER_SITE_OTHER): Payer: Medicare Other | Admitting: Family Medicine

## 2017-05-04 ENCOUNTER — Encounter: Payer: Self-pay | Admitting: Family Medicine

## 2017-05-04 VITALS — BP 137/69 | HR 92 | Temp 98.5°F | Wt 231.0 lb

## 2017-05-04 DIAGNOSIS — J0140 Acute pansinusitis, unspecified: Secondary | ICD-10-CM

## 2017-05-04 MED ORDER — FLUTICASONE PROPIONATE 50 MCG/ACT NA SUSP: 2.0000 | Freq: Every day | NASAL | 6 refills | Status: DC

## 2017-05-04 MED ORDER — AMOXICILLIN-POT CLAVULANATE 875-125 MG PO TABS: 1.0000 | ORAL_TABLET | Freq: Two times a day (BID) | ORAL | 0 refills | Status: DC

## 2017-05-04 NOTE — Progress Notes (Signed)
Pre visit review using our clinic review tool, if applicable. No additional management support is needed unless otherwise documented below in the visit note. 

## 2017-05-04 NOTE — Patient Instructions (Signed)

## 2017-05-04 NOTE — Progress Notes (Signed)
Patient ID: Kristina Ramos, female   DOB: February 04, 1941, 76 y.o.   MRN: [id number]   Subjective:  I acted as a Education administrator for [employer], DO. Raiford Noble, [place]   Patient ID: Kristina Ramos, female    DOB: 04-02-1941, 76 y.o.   MRN: [id number]  Chief Complaint  Patient presents with  . Sinusitis  . Sore Throat    Sinusitis  This is a new problem. The current episode started in the past 7 days. The problem is unchanged. There has been no fever. Her pain is at a severity of 7/10. The pain is moderate. Associated symptoms include headaches and a sore throat. Pertinent negatives include no congestion, coughing or shortness of breath. (States that her throat feels as if it is swollen when she swallows.)  Sore Throat   This is a new problem. The current episode started in the past 7 days. The problem has been gradually improving. There has been no fever. The pain is mild. Associated symptoms include headaches. Pertinent negatives include no congestion, coughing, shortness of breath or vomiting. She has had no exposure to strep or mono. Exposure to: States that her throat doesn't feel sore, feels swollen when she swallows.. She has tried acetaminophen for the symptoms. The treatment provided moderate relief.    Patient is in today for an acute visit. Patient suspects that she may have a sinus infection. States that her throat was sore on Friday; has had a headache off and on since Saturday. Patient states that her throat no longer feels sore, but feels swollen when she swallows. Pt has been taking claritin D , delsym and tylenol with little relief.     Patient Care Team: Carollee Herter, Alferd Apa, DO as PCP - General   Past Medical History:  Diagnosis Date  . Depression   . Hyperlipidemia   . Hypertension   . Thyroid disease     Past Surgical History:  Procedure Laterality Date  . KNEE SURGERY     Arthroscopy--left knee  . KNEE SURGERY  04/25/2014  . TUBAL LIGATION      Family History  Problem  Relation Age of Onset  . Coronary artery disease Unknown   . Diabetes Maternal Grandmother   . Diabetes Paternal Aunt   . Lung cancer Father        smoker  . Thyroid disease Unknown     Social History   Social History  . Marital status: Married    Spouse name: N/A  . Number of children: N/A  . Years of education: N/A   Occupational History  . Not on file.   Social History Main Topics  . Smoking status: Never Smoker  . Smokeless tobacco: Never Used  . Alcohol use No  . Drug use: No  . Sexual activity: Not on file   Other Topics Concern  . Not on file   Social History Narrative  . No narrative on file    Outpatient Medications Prior to Visit  Medication Sig Dispense Refill  . levothyroxine (SYNTHROID) 88 MCG tablet Take 1 tablet (88 mcg total) by mouth daily. 90 tablet 3  . lisinopril-hydrochlorothiazide (PRINZIDE,ZESTORETIC) 10-12.5 MG tablet Take 1 tablet by mouth daily. 90 tablet 1  . Vitamin D, Ergocalciferol, (DRISDOL) 50000 units CAPS capsule Take 1 capsule (50,000 Units total) by mouth every 7 (seven) days. 4 capsule 2   No facility-administered medications prior to visit.     Allergies  Allergen Reactions  . Aspirin  REACTION: pt states that it irritates her stomach    Review of Systems  Constitutional: Negative for fever and malaise/fatigue.  HENT: Positive for sinus pain and sore throat. Negative for congestion.        States that throat feels swollen when she swallows.  Eyes: Negative for blurred vision.  Respiratory: Negative for cough and shortness of breath.   Cardiovascular: Negative for chest pain, palpitations and leg swelling.  Gastrointestinal: Negative for vomiting.  Musculoskeletal: Negative for back pain.  Skin: Negative for rash.  Neurological: Positive for headaches. Negative for loss of consciousness.       Objective:    Physical Exam  Constitutional: She is oriented to person, place, and time. She appears well-developed and  well-nourished. No distress.  HENT:  Head: Normocephalic and atraumatic.  Nose: Right sinus exhibits maxillary sinus tenderness and frontal sinus tenderness. Left sinus exhibits maxillary sinus tenderness and frontal sinus tenderness.  Mouth/Throat: Posterior oropharyngeal erythema present. No oropharyngeal exudate or posterior oropharyngeal edema.  Eyes: Conjunctivae are normal.  Neck: Normal range of motion. No thyromegaly present.  Cardiovascular: Normal rate and regular rhythm.   Pulmonary/Chest: Effort normal and breath sounds normal. She has no wheezes.  Abdominal: Soft. Bowel sounds are normal. There is no tenderness.  Musculoskeletal: She exhibits no edema or deformity.  Neurological: She is alert and oriented to person, place, and time.  Skin: Skin is warm and dry. She is not diaphoretic.  Psychiatric: She has a normal mood and affect.  Nursing note and vitals reviewed.   BP 137/69 (BP Location: Left Arm, Patient Position: Sitting, Cuff Size: Large)   Pulse 92   Temp 98.5 F (36.9 C) (Oral)   Wt 231 lb (104.8 kg)   SpO2 100% Comment: RA  BMI 40.03 kg/m  Wt Readings from Last 3 Encounters:  05/04/17 231 lb (104.8 kg)  03/17/17 230 lb 9.6 oz (104.6 kg)  05/09/15 227 lb 6 oz (103.1 kg)   BP Readings from Last 3 Encounters:  05/04/17 137/69  03/17/17 138/71  05/09/15 130/80     Immunization History  Administered Date(s) Administered  . Pneumococcal Conjugate-13 03/17/2017  . Pneumococcal Polysaccharide-23 02/27/2010  . Td 03/23/2008    Health Maintenance  Topic Date Due  . COLONOSCOPY  04/27/1992  . MAMMOGRAM  08/11/2014  . INFLUENZA VACCINE  07/22/2017  . TETANUS/TDAP  03/23/2018  . DEXA SCAN  Completed  . PNA vac Low Risk Adult  Completed    Lab Results  Component Value Date   WBC 5.6 08/14/2011   HGB 13.8 08/14/2011   HCT 41.3 08/14/2011   PLT 283.0 08/14/2011   GLUCOSE 99 03/17/2017   CHOL 169 03/17/2017   TRIG 48.0 03/17/2017   HDL 50.50  03/17/2017   LDLCALC 109 (H) 03/17/2017   ALT 9 03/17/2017   AST 12 03/17/2017   NA 134 (L) 03/17/2017   K 4.0 03/17/2017   CL 98 03/17/2017   CREATININE 0.71 03/17/2017   BUN 11 03/17/2017   CO2 28 03/17/2017   TSH 0.41 03/17/2017    Lab Results  Component Value Date   TSH 0.41 03/17/2017   Lab Results  Component Value Date   WBC 5.6 08/14/2011   HGB 13.8 08/14/2011   HCT 41.3 08/14/2011   MCV 90.4 08/14/2011   PLT 283.0 08/14/2011   Lab Results  Component Value Date   NA 134 (L) 03/17/2017   K 4.0 03/17/2017   CO2 28 03/17/2017   GLUCOSE 99 03/17/2017  BUN 11 03/17/2017   CREATININE 0.71 03/17/2017   BILITOT 0.5 03/17/2017   ALKPHOS 77 03/17/2017   AST 12 03/17/2017   ALT 9 03/17/2017   PROT 6.8 03/17/2017   ALBUMIN 4.3 03/17/2017   CALCIUM 9.3 03/17/2017   GFR 85.32 03/17/2017   Lab Results  Component Value Date   CHOL 169 03/17/2017   Lab Results  Component Value Date   HDL 50.50 03/17/2017   Lab Results  Component Value Date   LDLCALC 109 (H) 03/17/2017   Lab Results  Component Value Date   TRIG 48.0 03/17/2017   Lab Results  Component Value Date   CHOLHDL 3 03/17/2017   No results found for: HGBA1C       Assessment & Plan:   Problem List Items Addressed This Visit    None    Visit Diagnoses    Acute pansinusitis, recurrence not specified    -  Primary   Relevant Medications   amoxicillin-clavulanate (AUGMENTIN) 875-125 MG tablet      I have discontinued Ms. Crossman's fluticasone. I am also having her start on amoxicillin-clavulanate. Additionally, I am having her maintain her lisinopril-hydrochlorothiazide, Vitamin D (Ergocalciferol), and levothyroxine.  Meds ordered this encounter  Medications  . amoxicillin-clavulanate (AUGMENTIN) 875-125 MG tablet    Sig: Take 1 tablet by mouth 2 (two) times daily.    Dispense:  20 tablet    Refill:  0  . DISCONTD: fluticasone (FLONASE) 50 MCG/ACT nasal spray    Sig: Place 2 sprays into  both nostrils daily.    Dispense:  16 g    Refill:  6    CMA served as scribe during this visit. History, Physical and Plan performed by medical provider. Documentation and orders reviewed and attested to.  Ann Held, DO

## 2017-05-21 ENCOUNTER — Encounter: Payer: Self-pay | Admitting: Family Medicine

## 2017-05-25 ENCOUNTER — Encounter: Payer: Self-pay | Admitting: Internal Medicine

## 2017-06-15 ENCOUNTER — Other Ambulatory Visit (INDEPENDENT_AMBULATORY_CARE_PROVIDER_SITE_OTHER): Payer: Medicare Other

## 2017-06-15 DIAGNOSIS — E559 Vitamin D deficiency, unspecified: Secondary | ICD-10-CM | POA: Diagnosis not present

## 2017-06-15 LAB — VITAMIN D 25 HYDROXY (VIT D DEFICIENCY, FRACTURES): VITD: 32.7 ng/mL (ref 30.00–100.00)

## 2017-06-16 ENCOUNTER — Other Ambulatory Visit: Payer: Self-pay | Admitting: Family Medicine

## 2017-06-18 ENCOUNTER — Other Ambulatory Visit: Payer: Medicare Other

## 2017-07-01 ENCOUNTER — Ambulatory Visit (INDEPENDENT_AMBULATORY_CARE_PROVIDER_SITE_OTHER): Payer: Medicare Other | Admitting: Internal Medicine

## 2017-07-01 ENCOUNTER — Encounter: Payer: Self-pay | Admitting: Internal Medicine

## 2017-07-01 VITALS — BP 140/80 | HR 68 | Ht 62.3 in | Wt 228.4 lb

## 2017-07-01 DIAGNOSIS — R194 Change in bowel habit: Secondary | ICD-10-CM | POA: Diagnosis not present

## 2017-07-01 DIAGNOSIS — R195 Other fecal abnormalities: Secondary | ICD-10-CM

## 2017-07-01 NOTE — Patient Instructions (Addendum)
You have been scheduled for a colonoscopy. Please follow written instructions given to you at your visit today.  Please pick up your prep supplies at the pharmacy. If you use inhalers (even only as needed), please bring them with you on the day of your procedure.   I appreciate the opportunity to care for you. Carl Gessner, MD, FACG 

## 2017-07-01 NOTE — Progress Notes (Signed)
Kristina Ramos 76 y.o. 1941-09-03 [id number]  Assessment & Plan:   Encounter Diagnoses  Name Primary?  . Cologuard + Yes  . Change in bowel habits     Colonoscopy is appropriate to evaluate.The risks and benefits as well as alternatives of endoscopic procedure(s) have been discussed and reviewed. All questions answered. The patient agrees to proceed. I appreciate the opportunity to care for this patient. CC: Kristina Held, DO   Subjective:   Chief Complaint:Positive Cologuard change in bowel habits  HPI The patient is a very nice 76 year old white woman that recently did a Cologuard test that she was hoping to avoid a colonoscopy. Was positive. She is also having a one or 2 month history of change in bowel habits with long and thin and smaller bowel movements. She has urgent defecation. There is no rectal bleeding but there is gas and mucous production. Her GI review of systems is otherwise negative at this time. She has never had a colonoscopy or any endoscopic evaluation. No recent CBC. Allergies  Allergen Reactions  . Aspirin     REACTION: pt states that it irritates her stomach, coated ASA is ok   Current Meds  Medication Sig  . levothyroxine (SYNTHROID) 88 MCG tablet Take 1 tablet (88 mcg total) by mouth daily.  Marland Kitchen lisinopril-hydrochlorothiazide (PRINZIDE,ZESTORETIC) 10-12.5 MG tablet Take 1 tablet by mouth daily.  . Vitamin D, Ergocalciferol, (DRISDOL) 50000 units CAPS capsule TAKE 1 CAPSULE (50,000 UNITS TOTAL) BY MOUTH EVERY 7 (SEVEN) DAYS.   Past Medical History:  Diagnosis Date  . Depression   . Hyperlipidemia   . Hypertension   . Thyroid disease    Past Surgical History:  Procedure Laterality Date  . TONSILLECTOMY    . TOTAL KNEE ARTHROPLASTY Left 04/25/2014  . TUBAL LIGATION     Social History   Social History  . Marital status: Married    Spouse name: N/A  . Number of children: 2  . Years of education: N/A   Occupational History  .  retired    Social History Main Topics  . Smoking status: Never Smoker  . Smokeless tobacco: Never Used  . Alcohol use No  . Drug use: No  . Sexual activity: Not on file   Other Topics Concern  . Not on file   Social History Narrative   Retired widowed 1 son one daughter   4 caffeinated beverages daily   07/01/2017   family history includes CAD in her mother; Cataracts in her mother; Diabetes in her maternal grandmother and paternal aunt; Heart disease in her father; Lung cancer in her father; Thyroid disease in her maternal aunt.   Review of Systems Positive for the history of present illness, some back pain and some pedal edema. All other review of systems are negative  Objective:   Physical Exam @BP  140/80 (BP Location: Left Arm, Patient Position: Sitting, Cuff Size: Large)   Pulse 68   Ht 5' 2.3" (1.582 m) Comment: height measured without shoes  Wt 228 lb 6 oz (103.6 kg)   BMI 41.37 kg/m @  General:  Well-developed, well-nourished and in no acute distress - obese Eyes:  anicteric. Lungs: Clear to auscultation bilaterally. Heart:  S1S2, no rubs, murmurs, gallops. Abdomen:  soft, non-tender, no hepatosplenomegaly, hernia, or mass and BS+.  Rectal: Edward Qualia PA-S present  NL anoderm, no mass, mildly tender, brown stool   Neuro:  A&O x 3.     Data Reviewed: Primary care notes. 03/17/2017

## 2017-07-03 ENCOUNTER — Encounter: Payer: Self-pay | Admitting: Internal Medicine

## 2017-07-23 ENCOUNTER — Encounter: Payer: Self-pay | Admitting: Internal Medicine

## 2017-07-23 ENCOUNTER — Ambulatory Visit (AMBULATORY_SURGERY_CENTER): Payer: Medicare Other | Admitting: Internal Medicine

## 2017-07-23 VITALS — BP 130/67 | HR 69 | Temp 98.0°F | Resp 22 | Ht 62.0 in | Wt 228.0 lb

## 2017-07-23 DIAGNOSIS — R195 Other fecal abnormalities: Secondary | ICD-10-CM

## 2017-07-23 DIAGNOSIS — D128 Benign neoplasm of rectum: Secondary | ICD-10-CM

## 2017-07-23 DIAGNOSIS — D122 Benign neoplasm of ascending colon: Secondary | ICD-10-CM | POA: Diagnosis not present

## 2017-07-23 DIAGNOSIS — R194 Change in bowel habit: Secondary | ICD-10-CM

## 2017-07-23 DIAGNOSIS — D123 Benign neoplasm of transverse colon: Secondary | ICD-10-CM

## 2017-07-23 DIAGNOSIS — I10 Essential (primary) hypertension: Secondary | ICD-10-CM | POA: Diagnosis not present

## 2017-07-23 MED ORDER — SODIUM CHLORIDE 0.9 % IV SOLN: 500.0000 mL | INTRAVENOUS | Status: DC

## 2017-07-23 NOTE — Progress Notes (Signed)
Report to PACU, RN, vss, BBS= Clear.  

## 2017-07-23 NOTE — Patient Instructions (Addendum)
   I found a very large rectal polyp and removed it. Think its benign but will need to wait for pathology. Two other tiny polyps also.  I appreciate the opportunity to care for you. Gatha Mayer, MD, [facility]   Discharge instructions given. Handouts on polyps and diverticulosis. Resume previous medications. YOU HAD AN ENDOSCOPIC PROCEDURE TODAY AT [facility]:   Refer to the procedure report that was given to you for any specific questions about what was found during the examination.  If the procedure report does not answer your questions, please call your gastroenterologist to clarify.  If you requested that your care partner not be given the details of your procedure findings, then the procedure report has been included in a sealed envelope for you to review at your convenience later.  YOU SHOULD EXPECT: Some feelings of bloating in the abdomen. Passage of more gas than usual.  Walking can help get rid of the air that was put into your GI tract during the procedure and reduce the bloating. If you had a lower endoscopy (such as a colonoscopy or flexible sigmoidoscopy) you may notice spotting of blood in your stool or on the toilet paper. If you underwent a bowel prep for your procedure, you may not have a normal bowel movement for a few days.  Please Note:  You might notice some irritation and congestion in your nose or some drainage.  This is from the oxygen used during your procedure.  There is no need for concern and it should clear up in a day or so.  SYMPTOMS TO REPORT IMMEDIATELY:   Following lower endoscopy (colonoscopy or flexible sigmoidoscopy):  Excessive amounts of blood in the stool  Significant tenderness or worsening of abdominal pains  Swelling of the abdomen that is new, acute  Fever of 100F or higher   For urgent or emergent issues, a gastroenterologist can be reached at any hour by calling [phone].   DIET:  We do recommend a small meal at  first, but then you may proceed to your regular diet.  Drink plenty of fluids but you should avoid alcoholic beverages for 24 hours.  ACTIVITY:  You should plan to take it easy for the rest of today and you should NOT DRIVE or use heavy machinery until tomorrow (because of the sedation medicines used during the test).    FOLLOW UP: Our staff will call the number listed on your records the next business day following your procedure to check on you and address any questions or concerns that you may have regarding the information given to you following your procedure. If we do not reach you, we will leave a message.  However, if you are feeling well and you are not experiencing any problems, there is no need to return our call.  We will assume that you have returned to your regular daily activities without incident.  If any biopsies were taken you will be contacted by phone or by letter within the next 1-3 weeks.  Please call us at [phone] if you have not heard about the biopsies in 3 weeks.    SIGNATURES/CONFIDENTIALITY: You and/or your care partner have signed paperwork which will be entered into your electronic medical record.  These signatures attest to the fact that that the information above on your After Visit Summary has been reviewed and is understood.  Full responsibility of the confidentiality of this discharge information lies with you and/or your care-partner.

## 2017-07-23 NOTE — Progress Notes (Signed)
Called to room to assist during endoscopic procedure.  Patient ID and intended procedure confirmed with present staff. Received instructions for my participation in the procedure from the performing physician.  

## 2017-07-23 NOTE — Op Note (Addendum)
[facility] Patient Name: Kristina Ramos Procedure Date: 07/23/2017 7:34 AM MRN: [id number] Endoscopist: Gatha Mayer , MD Age: 76 Referring MD:  Date of Birth: [birth year] Gender: Female Account #: [id number] Procedure:                Colonoscopy Indications:              Positive Cologuard test Medicines:                Propofol per Anesthesia, Monitored Anesthesia Care Procedure:                Pre-Anesthesia Assessment:                           - Prior to the procedure, a History and Physical                            was performed, and patient medications and                            allergies were reviewed. The patient's tolerance of                            previous anesthesia was also reviewed. The risks                            and benefits of the procedure and the sedation                            options and risks were discussed with the patient.                            All questions were answered, and informed consent                            was obtained. Prior Anticoagulants: The patient has                            taken no previous anticoagulant or antiplatelet                            agents. ASA Grade Assessment: III - A patient with                            severe systemic disease. After reviewing the risks                            and benefits, the patient was deemed in                            satisfactory condition to undergo the procedure.                           After obtaining informed consent, the colonoscope  was passed under direct vision. Throughout the                            procedure, the patient's blood pressure, pulse, and                            oxygen saturations were monitored continuously. The                            Colonoscope was introduced through the anus and                            advanced to the the cecum, identified by                            appendiceal orifice and  ileocecal valve. The                            colonoscopy was performed without difficulty. The                            patient tolerated the procedure well. The quality                            of the bowel preparation was excellent. The                            ileocecal valve, appendiceal orifice, and rectum                            were photographed. The bowel preparation used was                            Miralax. Scope In: 7:39:35 AM Scope Out: 8:22:55 AM Scope Withdrawal Time: 0 hours 39 minutes 0 seconds  Total Procedure Duration: 0 hours 43 minutes 20 seconds  Findings:                 The perianal and digital rectal examinations were                            normal.                           A 35 - 40 mm polyp was found in the rectum. The                            polyp was multi-lobulated, sessile and lateral                            spreading. The polyp was removed with a saline                            injection-lift technique using a hot snare and  piecemeal. EBL minimal. Resection and retrieval                            were complete using a suction (via the working                            channel). There was an area that I think was                            cauterized scar/char as opposed to residual polyp                            in lower portion.                           Two sessile polyps were found in the transverse                            colon and ascending colon. The polyps were 5 to 8                            mm in size. These polyps were removed with a cold                            snare. Resection and retrieval were complete.                            Verification of patient identification for the                            specimen was done. Estimated blood loss was minimal.                           Multiple diverticula were found in the sigmoid                            colon.                            The exam was otherwise without abnormality on                            direct and retroflexion views. Complications:            No immediate complications. Estimated Blood Loss:     Estimated blood loss was minimal. Impression:               - One 35 mm polyp in the rectum, removed using                            injection-lift and a hot snare. Resected and                            retrieved. Will need close f/u                           -  Two 5 to 8 mm polyps in the transverse colon and                            in the ascending colon, removed with a cold snare.                            Resected and retrieved.                           - Diverticulosis in the sigmoid colon.                           - The examination was otherwise normal on direct                            and retroflexion views. Recommendation:           - Patient has a contact number available for                            emergencies. The signs and symptoms of potential                            delayed complications were discussed with the                            patient. Return to normal activities tomorrow.                            Written discharge instructions were provided to the                            patient.                           - Resume previous diet.                           - Continue present medications.                           - No aspirin, ibuprofen, naproxen, or other                            non-steroidal anti-inflammatory drugs for 2 weeks                            after polyp removal.                           - Await pathology results.                           - Repeat colonoscopy is recommended for                            surveillance.  The colonoscopy date will be                            determined after pathology results from today's                            exam become available for review. Gatha Mayer, MD 07/23/2017 8:35:52 AM This report has  been signed electronically.

## 2017-07-24 ENCOUNTER — Telehealth: Payer: Self-pay

## 2017-07-24 ENCOUNTER — Encounter: Payer: Self-pay | Admitting: Family Medicine

## 2017-07-24 NOTE — Telephone Encounter (Signed)
  Follow up Call-  Call back number 07/23/2017  Post procedure Call Back phone  # [phone] hm  Permission to leave phone message Yes  Some recent data might be hidden     Patient questions:  Do you have a fever, pain , or abdominal swelling? No. Pain Score  0 *  Have you tolerated food without any problems? Yes.    Have you been able to return to your normal activities? Yes.    Do you have any questions about your discharge instructions: Diet   No. Medications  No. Follow up visit  No.  Do you have questions or concerns about your Care? No.  Actions: * If pain score is 4 or above: No action needed, pain <4.

## 2017-07-28 ENCOUNTER — Encounter: Payer: Self-pay | Admitting: Internal Medicine

## 2017-07-28 DIAGNOSIS — Z860101 Personal history of adenomatous and serrated colon polyps: Secondary | ICD-10-CM

## 2017-07-28 DIAGNOSIS — Z86010 Personal history of colonic polyps: Secondary | ICD-10-CM

## 2017-07-28 HISTORY — DX: Personal history of adenomatous and serrated colon polyps: Z86.0101

## 2017-07-28 HISTORY — DX: Personal history of colonic polyps: Z86.010

## 2017-07-28 NOTE — Progress Notes (Signed)
3 adenomas max 35 mm Repeat flex sig in 3 mos

## 2017-09-12 ENCOUNTER — Other Ambulatory Visit: Payer: Self-pay | Admitting: Family Medicine

## 2017-09-14 ENCOUNTER — Other Ambulatory Visit: Payer: Self-pay

## 2017-09-14 MED ORDER — LISINOPRIL-HYDROCHLOROTHIAZIDE 10-12.5 MG PO TABS: 1.0000 | ORAL_TABLET | Freq: Every day | ORAL | 0 refills | Status: DC

## 2017-09-18 ENCOUNTER — Other Ambulatory Visit: Payer: Medicare Other

## 2017-10-01 ENCOUNTER — Other Ambulatory Visit (INDEPENDENT_AMBULATORY_CARE_PROVIDER_SITE_OTHER): Payer: Medicare Other

## 2017-10-01 DIAGNOSIS — E785 Hyperlipidemia, unspecified: Secondary | ICD-10-CM

## 2017-10-01 LAB — LIPID PANEL
Cholesterol: 154 mg/dL (ref 0–200)
HDL: 39.1 mg/dL (ref >39.00)
LDL CALC: 100 mg/dL — AB (ref 0–99)
NonHDL: 114.51
TRIGLYCERIDES: 71 mg/dL (ref 0.0–149.0)
Total CHOL/HDL Ratio: 4
VLDL: 14.2 mg/dL (ref 0.0–40.0)

## 2017-10-01 LAB — COMPREHENSIVE METABOLIC PANEL
ALBUMIN: 4 g/dL (ref 3.5–5.2)
ALK PHOS: 67 U/L (ref 39–117)
ALT: 11 U/L (ref 0–35)
AST: 13 U/L (ref 0–37)
BILIRUBIN TOTAL: 0.5 mg/dL (ref 0.2–1.2)
BUN: 10 mg/dL (ref 6–23)
CO2: 25 mEq/L (ref 19–32)
CREATININE: 0.66 mg/dL (ref 0.40–1.20)
Calcium: 8.4 mg/dL (ref 8.4–10.5)
Chloride: 98 mEq/L (ref 96–112)
GFR: 92.69 mL/min (ref >60.00)
Glucose, Bld: 102 mg/dL — ABNORMAL HIGH (ref 70–99)
POTASSIUM: 4.2 meq/L (ref 3.5–5.1)
SODIUM: 129 meq/L — AB (ref 135–145)
TOTAL PROTEIN: 6.4 g/dL (ref 6.0–8.3)

## 2017-11-25 ENCOUNTER — Encounter: Payer: Self-pay | Admitting: Internal Medicine

## 2017-11-30 ENCOUNTER — Other Ambulatory Visit: Payer: Self-pay | Admitting: Family Medicine

## 2017-12-28 ENCOUNTER — Ambulatory Visit (INDEPENDENT_AMBULATORY_CARE_PROVIDER_SITE_OTHER): Payer: Medicare Other | Admitting: Family Medicine

## 2017-12-28 ENCOUNTER — Encounter: Payer: Self-pay | Admitting: Family Medicine

## 2017-12-28 VITALS — BP 110/70 | HR 82 | Temp 98.9°F | Resp 16 | Ht 62.0 in | Wt 220.0 lb

## 2017-12-28 DIAGNOSIS — R0602 Shortness of breath: Secondary | ICD-10-CM | POA: Diagnosis not present

## 2017-12-28 DIAGNOSIS — R5383 Other fatigue: Secondary | ICD-10-CM

## 2017-12-28 DIAGNOSIS — E039 Hypothyroidism, unspecified: Secondary | ICD-10-CM

## 2017-12-28 DIAGNOSIS — R7989 Other specified abnormal findings of blood chemistry: Secondary | ICD-10-CM

## 2017-12-28 DIAGNOSIS — R011 Cardiac murmur, unspecified: Secondary | ICD-10-CM | POA: Diagnosis not present

## 2017-12-28 DIAGNOSIS — I10 Essential (primary) hypertension: Secondary | ICD-10-CM

## 2017-12-28 LAB — COMPREHENSIVE METABOLIC PANEL
ALT: 11 U/L (ref 0–35)
AST: 16 U/L (ref 0–37)
Albumin: 3.7 g/dL (ref 3.5–5.2)
Alkaline Phosphatase: 84 U/L (ref 39–117)
BUN: 10 mg/dL (ref 6–23)
CALCIUM: 8.9 mg/dL (ref 8.4–10.5)
CHLORIDE: 89 meq/L — AB (ref 96–112)
CO2: 26 meq/L (ref 19–32)
CREATININE: 0.66 mg/dL (ref 0.40–1.20)
GFR: 92.63 mL/min (ref >60.00)
Glucose, Bld: 99 mg/dL (ref 70–99)
POTASSIUM: 4.2 meq/L (ref 3.5–5.1)
Sodium: 123 mEq/L — ABNORMAL LOW (ref 135–145)
Total Bilirubin: 0.6 mg/dL (ref 0.2–1.2)
Total Protein: 6.4 g/dL (ref 6.0–8.3)

## 2017-12-28 LAB — CBC WITH DIFFERENTIAL/PLATELET
BASOS PCT: 0.8 % (ref 0.0–3.0)
Basophils Absolute: 0.1 10*3/uL (ref 0.0–0.1)
Eosinophils Absolute: 0 10*3/uL (ref 0.0–0.7)
Eosinophils Relative: 0.6 % (ref 0.0–5.0)
HEMATOCRIT: 29.2 % — AB (ref 36.0–46.0)
Hemoglobin: 9.8 g/dL — ABNORMAL LOW (ref 12.0–15.0)
LYMPHS PCT: 39 % (ref 12.0–46.0)
Lymphs Abs: 3 10*3/uL (ref 0.7–4.0)
MCHC: 33.7 g/dL (ref 30.0–36.0)
MCV: 77.3 fl — AB (ref 78.0–100.0)
MONOS PCT: 5.3 % (ref 3.0–12.0)
Monocytes Absolute: 0.4 10*3/uL (ref 0.1–1.0)
NEUTROS ABS: 4.1 10*3/uL (ref 1.4–7.7)
Neutrophils Relative %: 54.3 % (ref 43.0–77.0)
PLATELETS: 215 10*3/uL (ref 150.0–400.0)
RBC: 3.78 Mil/uL — ABNORMAL LOW (ref 3.87–5.11)
RDW: 15.6 % — AB (ref 11.5–15.5)
WBC: 7.6 10*3/uL (ref 4.0–10.5)

## 2017-12-28 LAB — TSH: TSH: 0.22 u[IU]/mL — ABNORMAL LOW (ref 0.35–4.50)

## 2017-12-28 MED ORDER — LISINOPRIL 10 MG PO TABS: 10.0000 mg | ORAL_TABLET | Freq: Every day | ORAL | 1 refills | Status: DC

## 2017-12-28 NOTE — Patient Instructions (Signed)

## 2017-12-28 NOTE — Progress Notes (Signed)
Patient ID: Kristina Ramos, female   DOB: [birth year], 77 y.o.   MRN: [id number]     Subjective:  I acted as a Education administrator for Dr. Carollee Herter.  Kristina Ramos, Kristina Ramos   Patient ID: Kristina Ramos, female    DOB: 1941-06-18, 77 y.o.   MRN: [id number]  Chief Complaint  Patient presents with  . Fatigue  . Hot Flashes    HPI  Patient is in today for fatigue and hot flashes. She has has the fatigue for over 2 months now.  Could not hardly sing in church because she felt so tired. She just would get sweaty just doing one little thing. She also states her hearing dec at that time.  Her hearing is back to normal now.  It lasted 30 minutes.  No chest pain.   Some sob with exertion   Patient Care Team: Carollee Herter, Alferd Apa, DO as PCP - General   Past Medical History:  Diagnosis Date  . Arthritis   . Depression   . GERD (gastroesophageal reflux disease)   . Hx of adenomatous colonic polyps 07/28/2017  . Hyperlipidemia   . Hypertension   . Thyroid disease     Past Surgical History:  Procedure Laterality Date  . TONSILLECTOMY    . TOTAL KNEE ARTHROPLASTY Left 04/25/2014  . TUBAL LIGATION      Family History  Problem Relation Age of Onset  . Diabetes Maternal Grandmother   . Diabetes Paternal Aunt   . Lung cancer Father        smoker  . Heart disease Father   . CAD Mother   . Cataracts Mother   . Thyroid disease Maternal Aunt   . Colon cancer Neg Hx   . Esophageal cancer Neg Hx   . Pancreatic cancer Neg Hx   . Rectal cancer Neg Hx   . Stomach cancer Neg Hx     Social History   Socioeconomic History  . Marital status: Married    Spouse name: Not on file  . Number of children: 2  . Years of education: Not on file  . Highest education level: Not on file  Social Needs  . Financial resource strain: Not on file  . Food insecurity - worry: Not on file  . Food insecurity - inability: Not on file  . Transportation needs - medical: Not on file  . Transportation needs - non-medical: Not on  file  Occupational History  . Occupation: retired  Tobacco Use  . Smoking status: Never Smoker  . Smokeless tobacco: Never Used  Substance and Sexual Activity  . Alcohol use: No  . Drug use: No  . Sexual activity: Not on file  Other Topics Concern  . Not on file  Social History Narrative   Retired widowed 1 son one daughter   4 caffeinated beverages daily   07/01/2017    Outpatient Medications Prior to Visit  Medication Sig Dispense Refill  . amoxicillin (AMOXIL) 500 MG capsule Take 2,000 mg by mouth as needed. only for dental procedeure    . Cholecalciferol (VITAMIN D) 2000 units CAPS Take 1 capsule by mouth daily.    Marland Kitchen levothyroxine (SYNTHROID) 88 MCG tablet Take 1 tablet (88 mcg total) by mouth daily. 90 tablet 3  . lisinopril-hydrochlorothiazide (PRINZIDE,ZESTORETIC) 10-12.5 MG tablet TAKE 1 TABLET BY MOUTH EVERY DAY 90 tablet 0  . Vitamin D, Ergocalciferol, (DRISDOL) 50000 units CAPS capsule TAKE 1 CAPSULE (50,000 UNITS TOTAL) BY MOUTH EVERY 7 (SEVEN) DAYS. 4 capsule 2  Facility-Administered Medications Prior to Visit  Medication Dose Route Frequency Provider Last Rate Last Dose  . 0.9 %  sodium chloride infusion  500 mL Intravenous Continuous Gatha Mayer, MD        Allergies  Allergen Reactions  . Aspirin     REACTION: pt states that it irritates her stomach, coated ASA is ok    Review of Systems  Constitutional: Positive for diaphoresis and malaise/fatigue. Negative for fever.  HENT: Negative for congestion.   Eyes: Negative for blurred vision.  Respiratory: Negative for cough and shortness of breath.   Cardiovascular: Negative for chest pain, palpitations and leg swelling.  Gastrointestinal: Negative for vomiting.  Musculoskeletal: Negative for back pain.  Skin: Negative for rash.  Neurological: Negative for loss of consciousness and headaches.       Objective:    Physical Exam  Constitutional: She is oriented to person, place, and time. She appears  well-developed and well-nourished.  HENT:  Head: Normocephalic and atraumatic.  Eyes: Conjunctivae and EOM are normal.  Neck: Normal range of motion. Neck supple. No JVD present. Carotid bruit is not present. No thyromegaly present.  Cardiovascular: Normal rate and regular rhythm.  Murmur heard. Pulmonary/Chest: Effort normal and breath sounds normal. No respiratory distress. She has no wheezes. She has no rales. She exhibits no tenderness.  Musculoskeletal: She exhibits no edema.  Neurological: She is alert and oriented to person, place, and time.  Psychiatric: She has a normal mood and affect.  Nursing note and vitals reviewed.   BP 110/70 (BP Location: Left Arm, Cuff Size: Large)   Pulse 82   Temp 98.9 F (37.2 C) (Oral)   Resp 16   Ht 5\' 2"  (1.575 m)   Wt 220 lb (99.8 kg)   SpO2 97%   BMI 40.24 kg/m  Wt Readings from Last 3 Encounters:  12/28/17 220 lb (99.8 kg)  07/23/17 228 lb (103.4 kg)  07/01/17 228 lb 6 oz (103.6 kg)   BP Readings from Last 3 Encounters:  12/28/17 110/70  07/23/17 130/67  07/01/17 140/80     Immunization History  Administered Date(s) Administered  . Pneumococcal Conjugate-13 03/17/2017  . Pneumococcal Polysaccharide-23 02/27/2010  . Td 03/23/2008    Health Maintenance  Topic Date Due  . MAMMOGRAM  08/11/2014  . INFLUENZA VACCINE  07/22/2017  . TETANUS/TDAP  03/23/2018  . Fecal DNA (Cologuard)  03/26/2020  . DEXA SCAN  Completed  . PNA vac Low Risk Adult  Completed    Lab Results  Component Value Date   WBC 5.6 08/14/2011   HGB 13.8 08/14/2011   HCT 41.3 08/14/2011   PLT 283.0 08/14/2011   GLUCOSE 102 (H) 10/01/2017   CHOL 154 10/01/2017   TRIG 71.0 10/01/2017   HDL 39.10 10/01/2017   LDLCALC 100 (H) 10/01/2017   ALT 11 10/01/2017   AST 13 10/01/2017   NA 129 (L) 10/01/2017   K 4.2 10/01/2017   CL 98 10/01/2017   CREATININE 0.66 10/01/2017   BUN 10 10/01/2017   CO2 25 10/01/2017   TSH 0.41 03/17/2017    Lab Results    Component Value Date   TSH 0.41 03/17/2017   Lab Results  Component Value Date   WBC 5.6 08/14/2011   HGB 13.8 08/14/2011   HCT 41.3 08/14/2011   MCV 90.4 08/14/2011   PLT 283.0 08/14/2011   Lab Results  Component Value Date   NA 129 (L) 10/01/2017   K 4.2 10/01/2017   CO2 25  10/01/2017   GLUCOSE 102 (H) 10/01/2017   BUN 10 10/01/2017   CREATININE 0.66 10/01/2017   BILITOT 0.5 10/01/2017   ALKPHOS 67 10/01/2017   AST 13 10/01/2017   ALT 11 10/01/2017   PROT 6.4 10/01/2017   ALBUMIN 4.0 10/01/2017   CALCIUM 8.4 10/01/2017   GFR 92.69 10/01/2017   Lab Results  Component Value Date   CHOL 154 10/01/2017   Lab Results  Component Value Date   HDL 39.10 10/01/2017   Lab Results  Component Value Date   LDLCALC 100 (H) 10/01/2017   Lab Results  Component Value Date   TRIG 71.0 10/01/2017   Lab Results  Component Value Date   CHOLHDL 4 10/01/2017   No results found for: HGBA1C       Assessment & Plan:   Problem List Items Addressed This Visit      Unprioritized   Essential hypertension, benign    Running low--  Change lisinopril hct to lisinopril bp check 2-3 weeks        Relevant Medications   lisinopril (PRINIVIL,ZESTRIL) 10 MG tablet   Hypothyroidism - Primary   Relevant Orders   TSH    Other Visit Diagnoses    Low vitamin D level       Fatigue, unspecified type       Relevant Orders   TSH   Comprehensive metabolic panel   CBC with Differential/Platelet   Murmur, cardiac       Relevant Orders   ECHOCARDIOGRAM COMPLETE   EKG 12-Lead (Completed)   Essential hypertension       Relevant Medications   lisinopril (PRINIVIL,ZESTRIL) 10 MG tablet   Other Relevant Orders   EKG 12-Lead (Completed)   SOB (shortness of breath)       Relevant Orders   EKG 12-Lead (Completed)      I have discontinued Shanitra R. Selbe's Vitamin D (Ergocalciferol) and lisinopril-hydrochlorothiazide. I am also having her start on lisinopril. Additionally, I am having  her maintain her levothyroxine, amoxicillin, and Vitamin D. We will continue to administer sodium chloride.  Meds ordered this encounter  Medications  . lisinopril (PRINIVIL,ZESTRIL) 10 MG tablet    Sig: Take 1 tablet (10 mg total) by mouth daily.    Dispense:  30 tablet    Refill:  1    CMA served as scribe during this visit. History, Physical and Plan performed by medical provider. Documentation and orders reviewed and attested to.  Ann Held, DO

## 2017-12-28 NOTE — Assessment & Plan Note (Signed)
Running low--  Change lisinopril hct to lisinopril bp check 2-3 weeks

## 2017-12-30 ENCOUNTER — Other Ambulatory Visit: Payer: Self-pay

## 2017-12-30 ENCOUNTER — Telehealth: Payer: Self-pay

## 2017-12-30 DIAGNOSIS — I10 Essential (primary) hypertension: Secondary | ICD-10-CM

## 2017-12-30 DIAGNOSIS — D649 Anemia, unspecified: Secondary | ICD-10-CM

## 2017-12-30 DIAGNOSIS — Z86010 Personal history of colonic polyps: Secondary | ICD-10-CM

## 2017-12-30 DIAGNOSIS — E8881 Metabolic syndrome: Secondary | ICD-10-CM

## 2017-12-30 DIAGNOSIS — E782 Mixed hyperlipidemia: Secondary | ICD-10-CM

## 2017-12-30 DIAGNOSIS — E039 Hypothyroidism, unspecified: Secondary | ICD-10-CM

## 2017-12-30 MED ORDER — LEVOTHYROXINE SODIUM 75 MCG PO TABS: 75.0000 ug | ORAL_TABLET | Freq: Every day | ORAL | 3 refills | Status: DC

## 2017-12-31 ENCOUNTER — Other Ambulatory Visit: Payer: Self-pay

## 2017-12-31 ENCOUNTER — Telehealth: Payer: Self-pay | Admitting: Internal Medicine

## 2017-12-31 NOTE — Telephone Encounter (Signed)
It might make sense to put her on for later in February guessing that she would be ok then and if not improving we could postpone

## 2017-12-31 NOTE — Telephone Encounter (Signed)
Patient notified She does not feel up to having the procedures at this time.  She wants to get some strength back first. She understands that she will call back when she is ready to schedule endo/flex.

## 2017-12-31 NOTE — Telephone Encounter (Signed)
Dr. Carollee Herter is working up the anemia so occult blood testing can make sense but should have guaiac based test to check for upper GI bleeding.  What I would recommend she do instead is have an EGD with flex sig - I probably have not sent her recall letter yet but we can set these up direct. She can skip the hemoccult   Am ccing Dr. Carollee Herter

## 2017-12-31 NOTE — Telephone Encounter (Signed)
Patient is being treated by Dr, Etter Sjogren for hypotension and lethargy. She has ordered a fecal occult blood test.  Patient had a colonoscopy in August for a positive cologuard and is due for a flex this month.  She is asking if the fecal occult blood needs to be performed.

## 2017-12-31 NOTE — Telephone Encounter (Signed)
Labs orders placed

## 2017-12-31 NOTE — Telephone Encounter (Signed)
Thank you :)

## 2018-01-01 ENCOUNTER — Other Ambulatory Visit (INDEPENDENT_AMBULATORY_CARE_PROVIDER_SITE_OTHER): Payer: Medicare Other

## 2018-01-01 ENCOUNTER — Other Ambulatory Visit: Payer: Medicare Other

## 2018-01-01 DIAGNOSIS — E782 Mixed hyperlipidemia: Secondary | ICD-10-CM | POA: Diagnosis not present

## 2018-01-01 DIAGNOSIS — E039 Hypothyroidism, unspecified: Secondary | ICD-10-CM | POA: Diagnosis not present

## 2018-01-01 DIAGNOSIS — I10 Essential (primary) hypertension: Secondary | ICD-10-CM

## 2018-01-01 DIAGNOSIS — D649 Anemia, unspecified: Secondary | ICD-10-CM

## 2018-01-01 DIAGNOSIS — Z86010 Personal history of colonic polyps: Secondary | ICD-10-CM | POA: Diagnosis not present

## 2018-01-01 DIAGNOSIS — E8881 Metabolic syndrome: Secondary | ICD-10-CM

## 2018-01-01 LAB — CBC WITH DIFFERENTIAL/PLATELET
BASOS ABS: 0.1 10*3/uL (ref 0.0–0.1)
Basophils Relative: 1 % (ref 0.0–3.0)
Eosinophils Absolute: 0 10*3/uL (ref 0.0–0.7)
Eosinophils Relative: 0.7 % (ref 0.0–5.0)
HEMATOCRIT: 30.7 % — AB (ref 36.0–46.0)
HEMOGLOBIN: 10.2 g/dL — AB (ref 12.0–15.0)
LYMPHS PCT: 44.9 % (ref 12.0–46.0)
Lymphs Abs: 2.7 10*3/uL (ref 0.7–4.0)
MCHC: 33.3 g/dL (ref 30.0–36.0)
MCV: 78.5 fl (ref 78.0–100.0)
Monocytes Absolute: 0.4 10*3/uL (ref 0.1–1.0)
Monocytes Relative: 6.1 % (ref 3.0–12.0)
Neutro Abs: 2.9 10*3/uL (ref 1.4–7.7)
Neutrophils Relative %: 47.3 % (ref 43.0–77.0)
Platelets: 230 10*3/uL (ref 150.0–400.0)
RBC: 3.91 Mil/uL (ref 3.87–5.11)
RDW: 15.5 % (ref 11.5–15.5)
WBC: 6 10*3/uL (ref 4.0–10.5)

## 2018-01-01 LAB — IBC PANEL
IRON: 26 ug/dL — AB (ref 42–145)
SATURATION RATIOS: 7.8 % — AB (ref 20.0–50.0)
TRANSFERRIN: 237 mg/dL (ref 212.0–360.0)

## 2018-01-01 LAB — FERRITIN: Ferritin: 77.2 ng/mL (ref 10.0–291.0)

## 2018-01-01 NOTE — Telephone Encounter (Signed)
Patient notified of the recommendations She is scheduled for 02/17/18 and previsit 02/08/18

## 2018-01-07 ENCOUNTER — Telehealth: Payer: Self-pay | Admitting: *Deleted

## 2018-01-07 DIAGNOSIS — I10 Essential (primary) hypertension: Secondary | ICD-10-CM

## 2018-01-07 NOTE — Telephone Encounter (Signed)
Copied from [place] [id number]. Topic: General - Other >> Jan 07, 2018  2:04 PM Carolyn Stare wrote:  Pt call to say her bp has went up taking the following med and has develop a cough    lisinopril (PRINIVIL,ZESTRIL) 10 MG tablet  Pt would like a call back  [phone]    >> Jan 07, 2018  2:08 PM Carolyn Stare wrote:  Pt would like a copy of labs dione 12/28/17 and 01/01/18

## 2018-01-07 NOTE — Telephone Encounter (Signed)
Copied from [place] [id number]. Topic: General - Other >> Jan 07, 2018  2:04 PM Carolyn Stare wrote:  Pt call to say her bp has went up taking the following med and has develop a cough    lisinopril (PRINIVIL,ZESTRIL) 10 MG tablet  Pt would like a call back  [phone]    >> Jan 07, 2018  2:08 PM Carolyn Stare wrote:  Pt would like a copy of labs dione 12/28/17 and 01/01/18

## 2018-01-11 ENCOUNTER — Encounter: Payer: Self-pay | Admitting: *Deleted

## 2018-01-11 MED ORDER — LISINOPRIL-HYDROCHLOROTHIAZIDE 10-12.5 MG PO TABS: 1.0000 | ORAL_TABLET | Freq: Every day | ORAL | 0 refills | Status: DC

## 2018-01-11 NOTE — Telephone Encounter (Signed)
Dr.  Etter Sjogren do you want to change her lisinopril or ask any specific questions?   Labs mailed to patient

## 2018-01-11 NOTE — Telephone Encounter (Signed)
D/c lisinopril Start norvasc 5 mg 1 po qd  #30   2 refills bp check 2-3 weeks

## 2018-01-12 NOTE — Telephone Encounter (Signed)
Spoke with patient yesterday.  Advised what Dr. Etter Sjogren wrote and patient stated, why did she not put her back on her old medication?  I asked what medication and she stated lisinopril/hctz.  I advised her in Dr. Ivy Lynn presence that the medication has lisinopril in it which causes a cough.  She stated that she had some left over and had already started it over the weekend and she has not had the cough.  She thinks its because she did not have the hctz part of it.  Per Dr. Etter Sjogren ok for patient to continue the lisinopril/hctz.  She will follow up after she has her her heart procedure.

## 2018-01-13 ENCOUNTER — Ambulatory Visit (HOSPITAL_BASED_OUTPATIENT_CLINIC_OR_DEPARTMENT_OTHER)
Admission: RE | Admit: 2018-01-13 | Discharge: 2018-01-13 | Disposition: A | Discharge status: Home / Self Care | Payer: Medicare Other | Source: Ambulatory Visit | Attending: Family Medicine | Admitting: Family Medicine

## 2018-01-13 DIAGNOSIS — E669 Obesity, unspecified: Secondary | ICD-10-CM | POA: Insufficient documentation

## 2018-01-13 DIAGNOSIS — Z6841 Body Mass Index (BMI) 40.0 and over, adult: Secondary | ICD-10-CM | POA: Diagnosis not present

## 2018-01-13 DIAGNOSIS — E785 Hyperlipidemia, unspecified: Secondary | ICD-10-CM | POA: Diagnosis not present

## 2018-01-13 DIAGNOSIS — R011 Cardiac murmur, unspecified: Secondary | ICD-10-CM

## 2018-01-13 DIAGNOSIS — I119 Hypertensive heart disease without heart failure: Secondary | ICD-10-CM | POA: Insufficient documentation

## 2018-01-13 NOTE — Progress Notes (Signed)
  Echocardiogram 2D Echocardiogram has been performed.  Joelene Millin 01/13/2018, 8:59 AM

## 2018-02-08 ENCOUNTER — Other Ambulatory Visit: Payer: Self-pay

## 2018-02-08 ENCOUNTER — Ambulatory Visit (AMBULATORY_SURGERY_CENTER): Payer: Self-pay

## 2018-02-08 VITALS — Ht 64.0 in | Wt 215.8 lb

## 2018-02-08 DIAGNOSIS — Z86010 Personal history of colonic polyps: Secondary | ICD-10-CM

## 2018-02-08 DIAGNOSIS — D500 Iron deficiency anemia secondary to blood loss (chronic): Secondary | ICD-10-CM

## 2018-02-08 NOTE — Progress Notes (Signed)
Denies allergies to eggs or soy products. Denies complication of anesthesia or sedation. Denies use of weight loss medication. Denies use of O2.   Emmi instructions declined.  

## 2018-02-10 ENCOUNTER — Ambulatory Visit (INDEPENDENT_AMBULATORY_CARE_PROVIDER_SITE_OTHER): Payer: Medicare Other | Admitting: Family Medicine

## 2018-02-10 VITALS — BP 105/69 | HR 96 | Resp 16

## 2018-02-10 DIAGNOSIS — I10 Essential (primary) hypertension: Secondary | ICD-10-CM

## 2018-02-10 NOTE — Patient Instructions (Signed)
Please take lisinopril 10mg  once a day until your follow up with Dr Carollee Herter on 02/12/18 at [place].

## 2018-02-10 NOTE — Progress Notes (Signed)
Pre visit review using our clinic review tool, if applicable. No additional management support is needed unless otherwise documented below in the visit note.   Pt here for nurse visit for Blood Pressure check per order of PCP, Lowne Chase DO.  BP Readings from Last 3 Encounters:  12/28/17 110/70  07/23/17 130/67  07/01/17 140/80   Pt currently taking: Lisinopril / hctz 10/12.5mg  once a day. Reports fatigue and reduced activity over the last month.  BP today = 105/69 HR = 96  Pt reports a few episodes of becoming very hot, sweating and hearing pulse beat in her ears. Feels increasingly fatigued over the last month.  Per verbal from covering Provider, Dr Lorelei Pont, pt should take 1/2 tablet once a day and return to the office for follow up with PCP on Friday. Pt doesn't think she will be able to accurately cut pills in half as they are very small. Per verbal from Dr Lorelei Pont, pt may take the plain lisinopril 10mg  that she has at home until follow up on Friday.   Pt voices understanding.  Appointment scheduled for 02/12/18 at 9am with Dr Carollee Herter.  I consulted with Gilmore Laroche about this visit and agree with her documentation- J Copland MD

## 2018-02-12 ENCOUNTER — Encounter: Payer: Self-pay | Admitting: Family Medicine

## 2018-02-12 ENCOUNTER — Ambulatory Visit (INDEPENDENT_AMBULATORY_CARE_PROVIDER_SITE_OTHER): Payer: Medicare Other | Admitting: Family Medicine

## 2018-02-12 VITALS — BP 138/68 | HR 89 | Temp 97.9°F | Resp 16 | Ht 62.0 in | Wt 215.2 lb

## 2018-02-12 DIAGNOSIS — I10 Essential (primary) hypertension: Secondary | ICD-10-CM | POA: Diagnosis not present

## 2018-02-12 DIAGNOSIS — D500 Iron deficiency anemia secondary to blood loss (chronic): Secondary | ICD-10-CM | POA: Insufficient documentation

## 2018-02-12 NOTE — Progress Notes (Signed)
Patient ID: Kristina Ramos, female   DOB: 1941-07-14, 77 y.o.   MRN: [id number]    Subjective:  I acted as a Education administrator for Dr. Carollee Herter.  Guerry Bruin, [place]   Patient ID: Kristina Ramos, female    DOB: [birth year], 77 y.o.   MRN: [id number]  Chief Complaint  Patient presents with  . Hypertension    HPI  Patient is in today for follow up blood pressure.  She states that her blood pressures have been all over the place.  We check with her monitor in office here and it was 134/70. Patient Care Team: Carollee Herter, Alferd Apa, DO as PCP - General   Past Medical History:  Diagnosis Date  . Anemia   . Arthritis   . Depression   . GERD (gastroesophageal reflux disease)   . Heart murmur   . Hx of adenomatous colonic polyps 07/28/2017  . Hyperlipidemia   . Hypertension   . Thyroid disease     Past Surgical History:  Procedure Laterality Date  . TONSILLECTOMY    . TOTAL KNEE ARTHROPLASTY Left 04/25/2014  . TUBAL LIGATION      Family History  Problem Relation Age of Onset  . Diabetes Maternal Grandmother   . Diabetes Paternal Aunt   . Lung cancer Father        smoker  . Heart disease Father   . CAD Mother   . Cataracts Mother   . Thyroid disease Maternal Aunt   . Colon cancer Neg Hx   . Esophageal cancer Neg Hx   . Pancreatic cancer Neg Hx   . Rectal cancer Neg Hx   . Stomach cancer Neg Hx     Social History   Socioeconomic History  . Marital status: Widowed    Spouse name: Not on file  . Number of children: 2  . Years of education: Not on file  . Highest education level: Not on file  Social Needs  . Financial resource strain: Not on file  . Food insecurity - worry: Not on file  . Food insecurity - inability: Not on file  . Transportation needs - medical: Not on file  . Transportation needs - non-medical: Not on file  Occupational History  . Occupation: retired  Tobacco Use  . Smoking status: Never Smoker  . Smokeless tobacco: Never Used  Substance and Sexual Activity    . Alcohol use: No  . Drug use: No  . Sexual activity: Not on file  Other Topics Concern  . Not on file  Social History Narrative   Retired widowed 1 son one daughter   4 caffeinated beverages daily   07/01/2017    Outpatient Medications Prior to Visit  Medication Sig Dispense Refill  . amoxicillin (AMOXIL) 500 MG capsule Take 2,000 mg by mouth as needed. only for dental procedeure    . Cholecalciferol (VITAMIN D) 2000 units CAPS Take 1 capsule by mouth daily.    Marland Kitchen levothyroxine (SYNTHROID, LEVOTHROID) 75 MCG tablet Take 1 tablet (75 mcg total) by mouth daily. 90 tablet 3  . lisinopril (PRINIVIL,ZESTRIL) 10 MG tablet Take 10 mg by mouth daily.    Marland Kitchen lisinopril-hydrochlorothiazide (PRINZIDE,ZESTORETIC) 10-12.5 MG tablet Take 1 tablet by mouth daily. 90 tablet 0   Facility-Administered Medications Prior to Visit  Medication Dose Route Frequency Provider Last Rate Last Dose  . 0.9 %  sodium chloride infusion  500 mL Intravenous Continuous Gatha Mayer, MD        Allergies  Allergen Reactions  .  Aspirin     REACTION: pt states that it irritates her stomach, coated ASA is ok    Review of Systems  Constitutional: Negative for fever and malaise/fatigue.  HENT: Negative for congestion.   Eyes: Negative for blurred vision.  Respiratory: Negative for cough and shortness of breath.   Cardiovascular: Negative for chest pain, palpitations and leg swelling.  Gastrointestinal: Negative for vomiting.  Musculoskeletal: Negative for back pain.  Skin: Negative for rash.  Neurological: Negative for loss of consciousness and headaches.       Objective:    Physical Exam  Constitutional: She is oriented to person, place, and time. She appears well-developed and well-nourished.  HENT:  Head: Normocephalic and atraumatic.  Eyes: Conjunctivae and EOM are normal.  Neck: Normal range of motion. Neck supple. No JVD present. Carotid bruit is not present. No thyromegaly present.   Cardiovascular: Normal rate, regular rhythm and normal heart sounds.  No murmur heard. Pulmonary/Chest: Effort normal and breath sounds normal. No respiratory distress. She has no wheezes. She has no rales. She exhibits no tenderness.  Musculoskeletal: She exhibits no edema.  Neurological: She is alert and oriented to person, place, and time.  Psychiatric: She has a normal mood and affect.  Nursing note and vitals reviewed.   BP 138/68 (BP Location: Left Arm, Cuff Size: Large)   Pulse 89   Temp 97.9 F (36.6 C) (Oral)   Resp 16   Ht 5\' 2"  (1.575 m)   Wt 215 lb 3.2 oz (97.6 kg)   SpO2 98%   BMI 39.36 kg/m  Wt Readings from Last 3 Encounters:  02/12/18 215 lb 3.2 oz (97.6 kg)  02/08/18 215 lb 12.8 oz (97.9 kg)  12/28/17 220 lb (99.8 kg)   BP Readings from Last 3 Encounters:  02/12/18 138/68  02/10/18 105/69  12/28/17 110/70     Immunization History  Administered Date(s) Administered  . Pneumococcal Conjugate-13 03/17/2017  . Pneumococcal Polysaccharide-23 02/27/2010  . Td 03/23/2008    Health Maintenance  Topic Date Due  . MAMMOGRAM  08/11/2014  . INFLUENZA VACCINE  07/22/2017  . TETANUS/TDAP  03/23/2018  . Fecal DNA (Cologuard)  03/26/2020  . DEXA SCAN  Completed  . PNA vac Low Risk Adult  Completed    Lab Results  Component Value Date   WBC 6.0 01/01/2018   HGB 10.2 (L) 01/01/2018   HCT 30.7 (L) 01/01/2018   PLT 230.0 01/01/2018   GLUCOSE 99 12/28/2017   CHOL 154 10/01/2017   TRIG 71.0 10/01/2017   HDL 39.10 10/01/2017   LDLCALC 100 (H) 10/01/2017   ALT 11 12/28/2017   AST 16 12/28/2017   NA 123 (L) 12/28/2017   K 4.2 12/28/2017   CL 89 (L) 12/28/2017   CREATININE 0.66 12/28/2017   BUN 10 12/28/2017   CO2 26 12/28/2017   TSH 0.22 (L) 12/28/2017    Lab Results  Component Value Date   TSH 0.22 (L) 12/28/2017   Lab Results  Component Value Date   WBC 6.0 01/01/2018   HGB 10.2 (L) 01/01/2018   HCT 30.7 (L) 01/01/2018   MCV 78.5 01/01/2018    PLT 230.0 01/01/2018   Lab Results  Component Value Date   NA 123 (L) 12/28/2017   K 4.2 12/28/2017   CO2 26 12/28/2017   GLUCOSE 99 12/28/2017   BUN 10 12/28/2017   CREATININE 0.66 12/28/2017   BILITOT 0.6 12/28/2017   ALKPHOS 84 12/28/2017   AST 16 12/28/2017   ALT 11  12/28/2017   PROT 6.4 12/28/2017   ALBUMIN 3.7 12/28/2017   CALCIUM 8.9 12/28/2017   GFR 92.63 12/28/2017   Lab Results  Component Value Date   CHOL 154 10/01/2017   Lab Results  Component Value Date   HDL 39.10 10/01/2017   Lab Results  Component Value Date   LDLCALC 100 (H) 10/01/2017   Lab Results  Component Value Date   TRIG 71.0 10/01/2017   Lab Results  Component Value Date   CHOLHDL 4 10/01/2017   No results found for: HGBA1C       Assessment & Plan:   Problem List Items Addressed This Visit      Unprioritized   Essential hypertension, benign - Primary    Well controlled, no changes to meds. Encouraged heart healthy diet such as the DASH diet and exercise as tolerated.        Relevant Medications   lisinopril (PRINIVIL,ZESTRIL) 10 MG tablet   Iron deficiency anemia due to chronic blood loss    Pt is having sigmoidoscopy and egd next week Check cbcd today         I have discontinued Clydette R. Recinos's lisinopril-hydrochlorothiazide. I am also having her maintain her amoxicillin, Vitamin D, levothyroxine, and lisinopril. We will continue to administer sodium chloride.  No orders of the defined types were placed in this encounter.   CMA served as Education administrator during this visit. History, Physical and Plan performed by medical provider. Documentation and orders reviewed and attested to.  Ann Held, DO

## 2018-02-12 NOTE — Assessment & Plan Note (Signed)
Well controlled, no changes to meds. Encouraged heart healthy diet such as the DASH diet and exercise as tolerated.  °

## 2018-02-12 NOTE — Assessment & Plan Note (Signed)
Pt is having sigmoidoscopy and egd next week Check cbcd today

## 2018-02-12 NOTE — Patient Instructions (Signed)

## 2018-02-17 ENCOUNTER — Ambulatory Visit (AMBULATORY_SURGERY_CENTER): Payer: Medicare Other | Admitting: Internal Medicine

## 2018-02-17 ENCOUNTER — Encounter: Payer: Self-pay | Admitting: Internal Medicine

## 2018-02-17 ENCOUNTER — Other Ambulatory Visit (INDEPENDENT_AMBULATORY_CARE_PROVIDER_SITE_OTHER): Payer: Medicare Other

## 2018-02-17 ENCOUNTER — Other Ambulatory Visit: Payer: Self-pay

## 2018-02-17 VITALS — BP 113/43 | HR 90 | Temp 98.0°F | Resp 20 | Ht 62.0 in | Wt 215.0 lb

## 2018-02-17 DIAGNOSIS — K319 Disease of stomach and duodenum, unspecified: Secondary | ICD-10-CM | POA: Diagnosis not present

## 2018-02-17 DIAGNOSIS — Z86010 Personal history of colonic polyps: Secondary | ICD-10-CM | POA: Diagnosis not present

## 2018-02-17 DIAGNOSIS — D128 Benign neoplasm of rectum: Secondary | ICD-10-CM

## 2018-02-17 DIAGNOSIS — R109 Unspecified abdominal pain: Secondary | ICD-10-CM | POA: Diagnosis not present

## 2018-02-17 DIAGNOSIS — D509 Iron deficiency anemia, unspecified: Secondary | ICD-10-CM

## 2018-02-17 DIAGNOSIS — E871 Hypo-osmolality and hyponatremia: Secondary | ICD-10-CM

## 2018-02-17 LAB — BASIC METABOLIC PANEL
BUN: 8 mg/dL (ref 6–23)
CALCIUM: 8.9 mg/dL (ref 8.4–10.5)
CO2: 26 mEq/L (ref 19–32)
CREATININE: 0.62 mg/dL (ref 0.40–1.20)
Chloride: 96 mEq/L (ref 96–112)
GFR: 99.52 mL/min (ref >60.00)
Glucose, Bld: 99 mg/dL (ref 70–99)
Potassium: 4.5 mEq/L (ref 3.5–5.1)
Sodium: 129 mEq/L — ABNORMAL LOW (ref 135–145)

## 2018-02-17 LAB — CBC WITH DIFFERENTIAL/PLATELET
BASOS ABS: 0.1 10*3/uL (ref 0.0–0.1)
BASOS PCT: 1 % (ref 0.0–3.0)
EOS ABS: 0.1 10*3/uL (ref 0.0–0.7)
Eosinophils Relative: 1.3 % (ref 0.0–5.0)
HCT: 26.2 % — ABNORMAL LOW (ref 36.0–46.0)
HEMOGLOBIN: 9 g/dL — AB (ref 12.0–15.0)
Lymphocytes Relative: 33.4 % (ref 12.0–46.0)
Lymphs Abs: 2.5 10*3/uL (ref 0.7–4.0)
MCHC: 34.4 g/dL (ref 30.0–36.0)
MCV: 75 fl — ABNORMAL LOW (ref 78.0–100.0)
MONO ABS: 0.7 10*3/uL (ref 0.1–1.0)
Monocytes Relative: 9 % (ref 3.0–12.0)
Neutro Abs: 4.2 10*3/uL (ref 1.4–7.7)
Neutrophils Relative %: 55.3 % (ref 43.0–77.0)
Platelets: 236 10*3/uL (ref 150.0–400.0)
RBC: 3.49 Mil/uL — ABNORMAL LOW (ref 3.87–5.11)
RDW: 16.1 % — ABNORMAL HIGH (ref 11.5–15.5)
WBC: 7.6 10*3/uL (ref 4.0–10.5)

## 2018-02-17 MED ORDER — SODIUM CHLORIDE 0.9 % IV SOLN: 500.0000 mL | Freq: Once | INTRAVENOUS | Status: DC

## 2018-02-17 NOTE — Op Note (Signed)
[facility] Patient Name: Kristina Ramos Procedure Date: 02/17/2018 1:18 PM MRN: [id number] Endoscopist: Gatha Mayer , MD Age: 77 Referring MD:  Date of Birth: 05/02/1941 Gender: Female Account #: [id number] Procedure:                Upper GI endoscopy Indications:              Iron deficiency anemia Medicines:                Propofol per Anesthesia, Monitored Anesthesia Care Procedure:                Pre-Anesthesia Assessment:                           - Prior to the procedure, a History and Physical                            was performed, and patient medications and                            allergies were reviewed. The patient's tolerance of                            previous anesthesia was also reviewed. The risks                            and benefits of the procedure and the sedation                            options and risks were discussed with the patient.                            All questions were answered, and informed consent                            was obtained. Prior Anticoagulants: The patient has                            taken no previous anticoagulant or antiplatelet                            agents. ASA Grade Assessment: II - A patient with                            mild systemic disease. After reviewing the risks                            and benefits, the patient was deemed in                            satisfactory condition to undergo the procedure.                           After obtaining informed consent, the endoscope was  passed under direct vision. Throughout the                            procedure, the patient's blood pressure, pulse, and                            oxygen saturations were monitored continuously. The                            Model GIF-HQ190 [id number]) scope was introduced                            through the mouth, and advanced to the second part                            of  duodenum. The upper GI endoscopy was                            accomplished without difficulty. The patient                            tolerated the procedure well. Scope In: Scope Out: Findings:                 Diffuse atrophic mucosa was found in the entire                            examined stomach. Biopsies were taken with a cold                            forceps for histology. Verification of patient                            identification for the specimen was done. Estimated                            blood loss was minimal.                           The exam was otherwise without abnormality.                           The cardia and gastric fundus were normal on                            retroflexion.                           Biopsies for histology were taken with a cold                            forceps in the first portion of the duodenum and in                            the second portion of the duodenum  for evaluation                            of celiac disease. Verification of patient                            identification for the specimen was done. Estimated                            blood loss was minimal. Complications:            No immediate complications. Estimated Blood Loss:     Estimated blood loss was minimal. Impression:               - Gastric mucosal atrophy. Biopsied.                           - The examination was otherwise normal.                           - Biopsies were taken with a cold forceps for                            evaluation of celiac disease. Recommendation:           - Patient has a contact number available for                            emergencies. The signs and symptoms of potential                            delayed complications were discussed with the                            patient. Return to normal activities tomorrow.                            Written discharge instructions were provided to the                             patient.                           - Resume previous diet.                           - Continue present medications.                           - See the other procedure note for documentation of                            additional recommendations. flex sig next Gatha Mayer, MD 02/17/2018 1:53:24 PM This report has been signed electronically.

## 2018-02-17 NOTE — Progress Notes (Signed)
Labs done when she was through for EGD and flex sig today  Hgb down Na up  ? If she actually has bad chronic disease anemia or something else  No signs of blood loss  Last ferritin was ok  ? Heme eval?

## 2018-02-17 NOTE — Progress Notes (Signed)
A/ox3 pleased with MAC, report to Sarah RN 

## 2018-02-17 NOTE — Progress Notes (Signed)
Called to room to assist during endoscopic procedure.  Patient ID and intended procedure confirmed with present staff. Received instructions for my participation in the procedure from the performing physician.  

## 2018-02-17 NOTE — Op Note (Signed)
[facility] Patient Name: Kristina Ramos Procedure Date: 02/17/2018 1:17 PM MRN: [id number] Endoscopist: Gatha Mayer , MD Age: 77 Referring MD:  Date of Birth: [birth year] Gender: Female Account #: [id number] Procedure:                Flexible Sigmoidoscopy Indications:              Surveillance: Personal history of piecemeal removal                            of large sessile adenoma on last colonoscopy (less                            than 1 year ago) Medicines:                Propofol per Anesthesia, Monitored Anesthesia Care Procedure:                Pre-Anesthesia Assessment:                           - Prior to the procedure, a History and Physical                            was performed, and patient medications and                            allergies were reviewed. The patient's tolerance of                            previous anesthesia was also reviewed. The risks                            and benefits of the procedure and the sedation                            options and risks were discussed with the patient.                            All questions were answered, and informed consent                            was obtained. Prior Anticoagulants: The patient has                            taken no previous anticoagulant or antiplatelet                            agents. ASA Grade Assessment: II - A patient with                            mild systemic disease. After reviewing the risks                            and benefits, the patient was deemed in  satisfactory condition to undergo the procedure.                           After obtaining informed consent, the scope was                            passed under direct vision. The Model PCF-H190DL                            [id number]) scope was introduced through the anus                            and advanced to the the sigmoid colon. The flexible   sigmoidoscopy was accomplished without difficulty.                            The patient tolerated the procedure well. The                            quality of the bowel preparation was good. Scope In: Scope Out: Findings:                 The perianal and digital rectal examinations were                            normal.                           A post polypectomy scar was found in the rectum.                            There was residual polyp tissue. removed - see below                           A 8 mm polyp was found in the rectum. The polyp was                            sessile. The polyp was removed with a hot snare.                            Resection and retrieval were complete. Site also                            ablated with tip cautery. Verification of patient                            identification for the specimen was done. Estimated                            blood loss was minimal.                           The exam was otherwise without abnormality. Complications:            No immediate complications. Estimated  Blood Loss:     Estimated blood loss was minimal. Impression:               - Post-polypectomy scar in the rectum.                           - One 8 mm polyp in the rectum, removed with a hot                            snare. Resected and retrieved.                           - The examination was otherwise normal.                           - Personal history of colonic polyps. Recommendation:           - Patient has a contact number available for                            emergencies. The signs and symptoms of potential                            delayed complications were discussed with the                            patient. Return to normal activities tomorrow.                            Written discharge instructions were provided to the                            patient.                           - Resume previous diet.                           -  Continue present medications.                           - No aspirin, ibuprofen, naproxen, or other                            non-steroidal anti-inflammatory drugs for 2 weeks                            after polyp removal.                           - CBC and BMET today (ordered) Gatha Mayer, MD 02/17/2018 1:58:26 PM This report has been signed electronically.

## 2018-02-17 NOTE — Patient Instructions (Addendum)
The stomach looks a bit atrophic - I took biopsies there and in the intestine to see if you might be having poor absorption of iron.  There was a small part of the rectal polyp remaining - I removed it.  Will check blood count and sodium today.  Will call results of that and the pathology (or a letter)  I appreciate the opportunity to care for you. Gatha Mayer, MD, FACG YOU HAD AN ENDOSCOPIC PROCEDURE TODAY AT [facility]:   Refer to the procedure report that was given to you for any specific questions about what was found during the examination.  If the procedure report does not answer your questions, please call your gastroenterologist to clarify.  If you requested that your care partner not be given the details of your procedure findings, then the procedure report has been included in a sealed envelope for you to review at your convenience later.  YOU SHOULD EXPECT: Some feelings of bloating in the abdomen. Passage of more gas than usual.  Walking can help get rid of the air that was put into your GI tract during the procedure and reduce the bloating. If you had a lower endoscopy (such as a colonoscopy or flexible sigmoidoscopy) you may notice spotting of blood in your stool or on the toilet paper. If you underwent a bowel prep for your procedure, you may not have a normal bowel movement for a few days.  Please Note:  You might notice some irritation and congestion in your nose or some drainage.  This is from the oxygen used during your procedure.  There is no need for concern and it should clear up in a day or so.  SYMPTOMS TO REPORT IMMEDIATELY:   Following lower endoscopy (colonoscopy or flexible sigmoidoscopy):  Excessive amounts of blood in the stool  Significant tenderness or worsening of abdominal pains  Swelling of the abdomen that is new, acute  Fever of 100F or higher   Following upper endoscopy (EGD)  Vomiting of blood or coffee ground  material  New chest pain or pain under the shoulder blades  Painful or persistently difficult swallowing  New shortness of breath  Fever of 100F or higher  Black, tarry-looking stools  For urgent or emergent issues, a gastroenterologist can be reached at any hour by calling [phone].   DIET:  We do recommend a small meal at first, but then you may proceed to your regular diet.  Drink plenty of fluids but you should avoid alcoholic beverages for 24 hours.  MEDICATIONS: Continue present medications. No Aspirin, Ibuprofen, Naproxen, or other non-steroidal anti-inflammatory drugs for 2 weeks after polyp removal.  Please see handouts given to you by your recovery nurse.  Patient sent to lab for blood draw (CBC and BMET) prior to discharge.  ACTIVITY:  You should plan to take it easy for the rest of today and you should NOT DRIVE or use heavy machinery until tomorrow (because of the sedation medicines used during the test).    FOLLOW UP: Our staff will call the number listed on your records the next business day following your procedure to check on you and address any questions or concerns that you may have regarding the information given to you following your procedure. If we do not reach you, we will leave a message.  However, if you are feeling well and you are not experiencing any problems, there is no need to return our call.  We will assume  that you have returned to your regular daily activities without incident.  If any biopsies were taken you will be contacted by phone or by letter within the next 1-3 weeks.  Please call us at [phone] if you have not heard about the biopsies in 3 weeks.   Thank you for allowing [place] to provide for your healthcare needs today.  SIGNATURES/CONFIDENTIALITY: You and/or your care partner have signed paperwork which will be entered into your electronic medical record.  These signatures attest to the fact that that the information above on your  After Visit Summary has been reviewed and is understood.  Full responsibility of the confidentiality of this discharge information lies with you and/or your care-partner.

## 2018-02-18 ENCOUNTER — Telehealth: Payer: Self-pay

## 2018-02-18 NOTE — Telephone Encounter (Signed)
  Follow up Call-  Call back number 02/17/2018 07/23/2017  Post procedure Call Back phone  # [phone] hm  Permission to leave phone message Yes Yes  Some recent data might be hidden     Patient questions:  Do you have a fever, pain , or abdominal swelling? No. Pain Score  0 *  Have you tolerated food without any problems? Yes.    Have you been able to return to your normal activities? Yes.    Do you have any questions about your discharge instructions: Diet   No. Medications  No. Follow up visit  No.  Do you have questions or concerns about your Care? No.  Actions: * If pain score is 4 or above: No action needed, pain <4.

## 2018-02-23 ENCOUNTER — Other Ambulatory Visit: Payer: Self-pay

## 2018-02-23 DIAGNOSIS — D509 Iron deficiency anemia, unspecified: Secondary | ICD-10-CM

## 2018-02-23 NOTE — Progress Notes (Signed)
Call from office 1) stomach and duodenal bxs ok - no malabsorption 2) residual rectal polyp benign 3) Labs showed persistent anemia even though iron level had come up in Jan, na level better  Needs: 1) recall fkex sig in 6 mos to make sure rectal polyp totally gone 2) check B12 level re: microcytic anemia - maybe B12 is also low as iron was

## 2018-02-25 ENCOUNTER — Other Ambulatory Visit (INDEPENDENT_AMBULATORY_CARE_PROVIDER_SITE_OTHER): Payer: Medicare Other

## 2018-02-25 DIAGNOSIS — D509 Iron deficiency anemia, unspecified: Secondary | ICD-10-CM

## 2018-02-25 LAB — VITAMIN B12: VITAMIN B 12: 279 pg/mL (ref 211–911)

## 2018-02-26 NOTE — Progress Notes (Signed)
B12 level ok Keep f/u Dr Cheri Rous re: anemia

## 2018-03-02 ENCOUNTER — Other Ambulatory Visit: Payer: Self-pay | Admitting: *Deleted

## 2018-03-02 ENCOUNTER — Ambulatory Visit: Payer: Self-pay

## 2018-03-02 DIAGNOSIS — D649 Anemia, unspecified: Secondary | ICD-10-CM

## 2018-03-02 NOTE — Progress Notes (Signed)
c 

## 2018-03-02 NOTE — Telephone Encounter (Signed)
FYI

## 2018-03-02 NOTE — Telephone Encounter (Signed)
Pt calling to get referral information then pt c/o dry cough for a few weeks, bilateral ankle edema and pt states she is having night sweats. Pt also reports that she "gets hot." Pt states that she will begin sweating to the point her hair is wet. No 30 minute appt with PCP. Appt offered for tomorrow at 2:20 with Dr. Larose Kells.  Reason for Disposition . [1] Very swollen joint AND [2] no fever  Answer Assessment - Initial Assessment Questions 1. LOCATION: "Which joint is swollen?"     Bilateral  ankle 2. ONSET: "When did the swelling start?"     In the past week 3. SIZE: "How large is the swelling?"     Ankle hangs over tennis shoes 4. PAIN: "Is there any pain?" If so, ask: "How bad is it?" (Scale 1-10; or mild, moderate, severe)     no 5. CAUSE: "What do you think caused the swollen joint?"     BP medicine 6. OTHER SYMPTOMS: "Do you have any other symptoms?" (e.g., fever, chest pain, difficulty breathing, calf pain)     Cough, night sweats, when she increases activity she gets hot to the point her hair gets wet 7. PREGNANCY: "Is there any chance you are pregnant?" "When was your last menstrual period?"     n/a  Protocols used: ANKLE SWELLING-A-AH

## 2018-03-03 ENCOUNTER — Ambulatory Visit (INDEPENDENT_AMBULATORY_CARE_PROVIDER_SITE_OTHER): Payer: Medicare Other | Admitting: Internal Medicine

## 2018-03-03 ENCOUNTER — Ambulatory Visit (HOSPITAL_BASED_OUTPATIENT_CLINIC_OR_DEPARTMENT_OTHER)
Admission: RE | Admit: 2018-03-03 | Discharge: 2018-03-03 | Disposition: A | Discharge status: Home / Self Care | Payer: Medicare Other | Source: Ambulatory Visit | Attending: Internal Medicine | Admitting: Internal Medicine

## 2018-03-03 ENCOUNTER — Encounter: Payer: Self-pay | Admitting: Internal Medicine

## 2018-03-03 VITALS — BP 136/66 | HR 103 | Temp 97.3°F | Resp 16 | Ht 62.0 in | Wt 211.2 lb

## 2018-03-03 DIAGNOSIS — R05 Cough: Secondary | ICD-10-CM | POA: Insufficient documentation

## 2018-03-03 DIAGNOSIS — R634 Abnormal weight loss: Secondary | ICD-10-CM | POA: Diagnosis not present

## 2018-03-03 DIAGNOSIS — D500 Iron deficiency anemia secondary to blood loss (chronic): Secondary | ICD-10-CM

## 2018-03-03 DIAGNOSIS — E039 Hypothyroidism, unspecified: Secondary | ICD-10-CM

## 2018-03-03 DIAGNOSIS — D509 Iron deficiency anemia, unspecified: Secondary | ICD-10-CM | POA: Diagnosis not present

## 2018-03-03 DIAGNOSIS — R059 Cough, unspecified: Secondary | ICD-10-CM

## 2018-03-03 MED ORDER — LISINOPRIL 10 MG PO TABS: 10.0000 mg | ORAL_TABLET | Freq: Every day | ORAL | 0 refills | Status: DC

## 2018-03-03 NOTE — Progress Notes (Signed)
Pre visit review using our clinic review tool, if applicable. No additional management support is needed unless otherwise documented below in the visit note. 

## 2018-03-03 NOTE — Progress Notes (Signed)
Subjective:    Patient ID: Kristina Ramos, female    DOB: 1941/10/28, 77 y.o.   MRN: [id number]  DOS:  03/03/2018 Type of visit - description : Acute, multiple concerns Interval history: Seen by PCP 12-28-17 with fatigue which started approximately 10-2017 and hot flashes. At the time, BP was 110/70, slightly low, she was found to have  anemia, echocardiogram showed a normal EF .  Due to anemia, had EGD and flex sigmoidoscopy 02/17/2018, stomach and duodenal biopsies okay without malabsorption.  She had a residual  rectal polyp which was benign.  She is here because several symptoms. --Cough: 4 weeks, increases when she talks.  No sputum production. Denies fever, chills, runny nose, sore throat, wheezing.  Has not taking any medication for cough --Reports feeling "hot", night sweats, similar symptoms described in January however patient states that this is going on for only  2 weeks.  When asked, admits to some weight loss, per our scales 4 pounds. ---Also complained of lower extremity edema, bilateral, worse in the afternoon, basically no edema in the morning. Denies chest pain, difficulty breathing. No orthopnea She does have some DOE when she tries to walk fast. ---Continue with severe fatigue as reported  January 2019 to her PCP.   Wt Readings from Last 3 Encounters:  03/03/18 211 lb 4 oz (95.8 kg)  02/17/18 215 lb (97.5 kg)  02/12/18 215 lb 3.2 oz (97.6 kg)    Review of Systems See above  Past Medical History:  Diagnosis Date  . Anemia   . Arthritis   . Depression   . GERD (gastroesophageal reflux disease)   . Heart murmur   . Hx of adenomatous colonic polyps 07/28/2017  . Hyperlipidemia   . Hypertension   . Thyroid disease     Past Surgical History:  Procedure Laterality Date  . TONSILLECTOMY    . TOTAL KNEE ARTHROPLASTY Left 04/25/2014  . TUBAL LIGATION      Social History   Socioeconomic History  . Marital status: Widowed    Spouse name: Not on file  .  Number of children: 2  . Years of education: Not on file  . Highest education level: Not on file  Social Needs  . Financial resource strain: Not on file  . Food insecurity - worry: Not on file  . Food insecurity - inability: Not on file  . Transportation needs - medical: Not on file  . Transportation needs - non-medical: Not on file  Occupational History  . Occupation: retired  Tobacco Use  . Smoking status: Never Smoker  . Smokeless tobacco: Never Used  Substance and Sexual Activity  . Alcohol use: No  . Drug use: No  . Sexual activity: Not on file  Other Topics Concern  . Not on file  Social History Narrative   Retired widowed 1 son one daughter   4 caffeinated beverages daily   07/01/2017      Allergies as of 03/03/2018      Reactions   Aspirin    REACTION: pt states that it irritates her stomach, coated ASA is ok      Medication List        Accurate as of 03/03/18 11:59 PM. Always use your most recent med list.          amoxicillin 500 MG capsule Commonly known as:  AMOXIL Take 2,000 mg by mouth as needed. only for dental procedeure   levothyroxine 75 MCG tablet Commonly known as:  SYNTHROID,  LEVOTHROID Take 1 tablet (75 mcg total) by mouth daily.   lisinopril 10 MG tablet Commonly known as:  PRINIVIL,ZESTRIL Take 1 tablet (10 mg total) by mouth daily.   Vitamin D 2000 units Caps Take 1 capsule by mouth daily.          Objective:   Physical Exam BP 136/66 (BP Location: Left Arm, Patient Position: Sitting, Cuff Size: Normal)   Pulse (!) 103   Temp (!) 97.3 F (36.3 C) (Oral)   Resp 16   Ht 5\' 2"  (1.575 m)   Wt 211 lb 4 oz (95.8 kg)   SpO2 96%   BMI 38.64 kg/m  General:   Well developed, well nourished . NAD.  HEENT:  Normocephalic . Face symmetric, atraumatic  Neck: No supraclavicular mass Lymphatic system: No LADs at the axillary areas or groins Lungs:  CTA B Normal respiratory effort, no intercostal retractions, no accessory muscle  use. Heart: RRR,   soft systolic murmur?  .  Trace pretibial edema bilaterally  Abdomen:  Not distended, soft, non-tender. No rebound or rigidity.   Skin: Not pale. Not jaundice Neurologic:  alert & oriented X3.  Speech normal, gait appropriate for age and unassisted Psych--  Cognition and judgment appear intact.  Cooperative with normal attention span and concentration.  Behavior appropriate. No anxious or depressed appearing.     Assessment & Plan:    77 year old female with history of HTN, hypothyroidism, recent iron deficiency anemia S/B EGD and sigmoidoscopy presents with multiple problems. Cough Night sweats Lower extremity edema in the context of discontinuing HCTZ few weeks ago Ongoing fatigue. Hyponatremia, improved from 123 01/23/2028 Hypothyroidism  Plan: Cough, night sweats, mild weight loss: Check a chest x-ray LE edema: edema noted at the end of the day, recent echocardiogram with normal EF, edema likely positional.  Also HCTZ was discontinued 2 weeks ago due to low BP.  Recommend low-salt diet,  leg elevation Hyponatremia: Improving Hypothyroidism: TSH was a slightly suppressed 12/28/2017, dose of Synthroid adjusted, check a TSH Anemia: Last hemoglobin low, will repeat a CBC, also check a reticulocyte count and peripheral blood smear, iron and ferritin. Last B12 was in the low side of normal, she has not taking any supplements, once we have the results would likely recommend iron supplementation and vitamins.  Also, GI is considering hematology referral DOE: Likely due to anemia Night sweats, hot flashes: Rechecking a CBC and blood smear as above. RTC to see PCP 10 days. >> 35 minutes

## 2018-03-03 NOTE — Patient Instructions (Addendum)
GO TO THE LAB : Get the blood work     GO TO THE FRONT DESK Schedule your next appointment for a   please come see Dr. Etter Sjogren in 10 days   STOP BY THE FIRST FLOOR:  get the XR    Low-salt diet  Leg elevation

## 2018-03-04 LAB — CBC WITH DIFFERENTIAL/PLATELET
BASOS ABS: 0.1 10*3/uL (ref 0.0–0.1)
Basophils Relative: 0.9 % (ref 0.0–3.0)
Eosinophils Absolute: 0.1 10*3/uL (ref 0.0–0.7)
Eosinophils Relative: 1 % (ref 0.0–5.0)
HCT: 26.4 % — ABNORMAL LOW (ref 36.0–46.0)
HEMOGLOBIN: 9.5 g/dL — AB (ref 12.0–15.0)
LYMPHS ABS: 3 10*3/uL (ref 0.7–4.0)
Lymphocytes Relative: 41.8 % (ref 12.0–46.0)
MCHC: 35.9 g/dL (ref 30.0–36.0)
MCV: 74.1 fl — AB (ref 78.0–100.0)
MONOS PCT: 6.7 % (ref 3.0–12.0)
Monocytes Absolute: 0.5 10*3/uL (ref 0.1–1.0)
Neutro Abs: 3.5 10*3/uL (ref 1.4–7.7)
Neutrophils Relative %: 49.6 % (ref 43.0–77.0)
Platelets: 241 10*3/uL (ref 150.0–400.0)
RBC: 3.57 Mil/uL — AB (ref 3.87–5.11)
RDW: 16.6 % — ABNORMAL HIGH (ref 11.5–15.5)
WBC: 7.1 10*3/uL (ref 4.0–10.5)

## 2018-03-04 LAB — RETICULOCYTES
ABS RETIC: 105600 {cells}/uL — AB (ref 20000–8000)
Retic Ct Pct: 3 %

## 2018-03-04 LAB — IRON: IRON: 11 ug/dL — AB (ref 42–145)

## 2018-03-04 LAB — FERRITIN: FERRITIN: 76.5 ng/mL (ref 10.0–291.0)

## 2018-03-04 LAB — FOLATE: FOLATE: 12.3 ng/mL (ref >5.9)

## 2018-03-04 LAB — TSH: TSH: 0.61 u[IU]/mL (ref 0.35–4.50)

## 2018-03-04 LAB — PATHOLOGIST SMEAR REVIEW

## 2018-03-05 MED ORDER — FERROUS SULFATE 325 (65 FE) MG PO TABS: 325.0000 mg | ORAL_TABLET | Freq: Two times a day (BID) | ORAL | 6 refills | Status: DC

## 2018-03-05 NOTE — Addendum Note (Signed)
Addended byDamita Dunnings D on: 03/05/2018 05:31 PM   Modules accepted: Orders

## 2018-03-18 ENCOUNTER — Ambulatory Visit (INDEPENDENT_AMBULATORY_CARE_PROVIDER_SITE_OTHER): Payer: Medicare Other | Admitting: Family Medicine

## 2018-03-18 ENCOUNTER — Encounter: Payer: Self-pay | Admitting: Family Medicine

## 2018-03-18 ENCOUNTER — Other Ambulatory Visit: Payer: Medicare Other

## 2018-03-18 VITALS — BP 120/70 | HR 92 | Temp 98.3°F | Resp 16 | Ht 62.0 in | Wt 209.0 lb

## 2018-03-18 DIAGNOSIS — R7301 Impaired fasting glucose: Secondary | ICD-10-CM | POA: Diagnosis not present

## 2018-03-18 DIAGNOSIS — D509 Iron deficiency anemia, unspecified: Secondary | ICD-10-CM | POA: Diagnosis not present

## 2018-03-18 DIAGNOSIS — R61 Generalized hyperhidrosis: Secondary | ICD-10-CM | POA: Diagnosis not present

## 2018-03-18 DIAGNOSIS — E782 Mixed hyperlipidemia: Secondary | ICD-10-CM | POA: Diagnosis not present

## 2018-03-18 DIAGNOSIS — I10 Essential (primary) hypertension: Secondary | ICD-10-CM

## 2018-03-18 LAB — CBC WITH DIFFERENTIAL/PLATELET
BASOS ABS: 51 {cells}/uL (ref 0–200)
Basophils Relative: 0.8 %
Eosinophils Absolute: 38 cells/uL (ref 15–500)
Eosinophils Relative: 0.6 %
HEMATOCRIT: 27 % — AB (ref 35.0–45.0)
HEMOGLOBIN: 8.6 g/dL — AB (ref 11.7–15.5)
Lymphs Abs: 2803 cells/uL (ref 850–3900)
MCH: 23.7 pg — ABNORMAL LOW (ref 27.0–33.0)
MCHC: 31.9 g/dL — AB (ref 32.0–36.0)
MCV: 74.4 fL — ABNORMAL LOW (ref 80.0–100.0)
MPV: 10.1 fL (ref 7.5–12.5)
Monocytes Relative: 8.8 %
NEUTROS ABS: 2944 {cells}/uL (ref 1500–7800)
NEUTROS PCT: 46 %
Platelets: 223 10*3/uL (ref 140–400)
RBC: 3.63 10*6/uL — AB (ref 3.80–5.10)
RDW: 14.8 % (ref 11.0–15.0)
Total Lymphocyte: 43.8 %
WBC: 6.4 10*3/uL (ref 3.8–10.8)
WBCMIX: 563 {cells}/uL (ref 200–950)

## 2018-03-18 LAB — TSH: TSH: 1.16 u[IU]/mL (ref 0.35–4.50)

## 2018-03-18 LAB — IBC PANEL
Iron: 14 ug/dL — ABNORMAL LOW (ref 42–145)
SATURATION RATIOS: 4.3 % — AB (ref 20.0–50.0)
Transferrin: 230 mg/dL (ref 212.0–360.0)

## 2018-03-18 LAB — FERRITIN: FERRITIN: 66.5 ng/mL (ref 10.0–291.0)

## 2018-03-18 MED ORDER — LISINOPRIL-HYDROCHLOROTHIAZIDE 10-12.5 MG PO TABS: 1.0000 | ORAL_TABLET | Freq: Every day | ORAL | 3 refills | Status: DC

## 2018-03-18 MED ORDER — CYANOCOBALAMIN 1000 MCG/ML IJ SOLN
1000.0000 ug | Freq: Once | INTRAMUSCULAR | Status: AC
  Administered 2018-03-18: 1000 ug via INTRAMUSCULAR

## 2018-03-18 NOTE — Patient Instructions (Signed)
Anemia Anemia is a condition in which you do not have enough red blood cells or hemoglobin. Hemoglobin is a substance in red blood cells that carries oxygen. When you do not have enough red blood cells or hemoglobin (are anemic), your body cannot get enough oxygen and your organs may not work properly. As a result, you may feel very tired or have other problems. What are the causes? Common causes of anemia include:  Excessive bleeding. Anemia can be caused by excessive bleeding inside or outside the body, including bleeding from the intestine or from periods in women.  Poor nutrition.  Long-lasting (chronic) kidney, thyroid, and liver disease.  Bone marrow disorders.  Cancer and treatments for cancer.  HIV (human immunodeficiency virus) and AIDS (acquired immunodeficiency syndrome).  Treatments for HIV and AIDS.  Spleen problems.  Blood disorders.  Infections, medicines, and autoimmune disorders that destroy red blood cells.  What are the signs or symptoms? Symptoms of this condition include:  Minor weakness.  Dizziness.  Headache.  Feeling heartbeats that are irregular or faster than normal (palpitations).  Shortness of breath, especially with exercise.  Paleness.  Cold sensitivity.  Indigestion.  Nausea.  Difficulty sleeping.  Difficulty concentrating.  Symptoms may occur suddenly or develop slowly. If your anemia is mild, you may not have symptoms. How is this diagnosed? This condition is diagnosed based on:  Blood tests.  Your medical history.  A physical exam.  Bone marrow biopsy.  Your health care provider may also check your stool (feces) for blood and may do additional testing to look for the cause of your bleeding. You may also have other tests, including:  Imaging tests, such as a CT scan or MRI.  Endoscopy.  Colonoscopy.  How is this treated? Treatment for this condition depends on the cause. If you continue to lose a lot of blood,  you may need to be treated at a hospital. Treatment may include:  Taking supplements of iron, vitamin G62, or folic acid.  Taking a hormone medicine (erythropoietin) that can help to stimulate red blood cell growth.  Having a blood transfusion. This may be needed if you lose a lot of blood.  Making changes to your diet.  Having surgery to remove your spleen.  Follow these instructions at home:  Take over-the-counter and prescription medicines only as told by your health care provider.  Take supplements only as told by your health care provider.  Follow any diet instructions that you were given.  Keep all follow-up visits as told by your health care provider. This is important. Contact a health care provider if:  You develop new bleeding anywhere in the body. Get help right away if:  You are very weak.  You are short of breath.  You have pain in your abdomen or chest.  You are dizzy or feel faint.  You have trouble concentrating.  You have bloody or black, tarry stools.  You vomit repeatedly or you vomit up blood. Summary  Anemia is a condition in which you do not have enough red blood cells or enough of a substance in your red blood cells that carries oxygen (hemoglobin).  Symptoms may occur suddenly or develop slowly.  If your anemia is mild, you may not have symptoms.  This condition is diagnosed with blood tests as well as a medical history and physical exam. Other tests may be needed.  Treatment for this condition depends on the cause of the anemia. This information is not intended to replace advice  given to you by your health care provider. Make sure you discuss any questions you have with your health care provider. Document Released: 01/15/2005 Document Revised: 01/09/2017 Document Reviewed: 01/09/2017 Elsevier Interactive Patient Education  [employer].

## 2018-03-18 NOTE — Assessment & Plan Note (Signed)
Pt to see hematology  con't iron for now

## 2018-03-18 NOTE — Assessment & Plan Note (Signed)
Encouraged heart healthy diet, increase exercise, avoid trans fats, consider a krill oil cap daily 

## 2018-03-18 NOTE — Progress Notes (Signed)
Patient ID: Kristina Ramos, female   DOB: 11-18-1941, 77 y.o.   MRN: [id number]    Subjective:  I acted as a Education administrator for Dr. Carollee Herter.  Guerry Bruin, [place]   Patient ID: Kristina Ramos, female    DOB: 10-06-1941, 77 y.o.   MRN: [id number]    HPI  Patient is in today for follow up blood pressure and right foot swelling.  She is also c/o night sweats x 3 weeks -- she has to change clothes   Patient Care Team: Carollee Herter, Alferd Apa, DO as PCP - General   Past Medical History:  Diagnosis Date  . Anemia   . Arthritis   . Depression   . GERD (gastroesophageal reflux disease)   . Heart murmur   . Hx of adenomatous colonic polyps 07/28/2017  . Hyperlipidemia   . Hypertension   . Thyroid disease     Past Surgical History:  Procedure Laterality Date  . TONSILLECTOMY    . TOTAL KNEE ARTHROPLASTY Left 04/25/2014  . TUBAL LIGATION      Family History  Problem Relation Age of Onset  . Diabetes Maternal Grandmother   . Diabetes Paternal Aunt   . Lung cancer Father        smoker  . Heart disease Father   . CAD Mother   . Cataracts Mother   . Thyroid disease Maternal Aunt   . Colon cancer Neg Hx   . Esophageal cancer Neg Hx   . Pancreatic cancer Neg Hx   . Rectal cancer Neg Hx   . Stomach cancer Neg Hx     Social History   Socioeconomic History  . Marital status: Widowed    Spouse name: Not on file  . Number of children: 2  . Years of education: Not on file  . Highest education level: Not on file  Occupational History  . Occupation: retired  Scientific laboratory technician  . Financial resource strain: Not on file  . Food insecurity:    Worry: Not on file    Inability: Not on file  . Transportation needs:    Medical: Not on file    Non-medical: Not on file  Tobacco Use  . Smoking status: Never Smoker  . Smokeless tobacco: Never Used  Substance and Sexual Activity  . Alcohol use: No  . Drug use: No  . Sexual activity: Not on file  Lifestyle  . Physical activity:    Days per week: Not  on file    Minutes per session: Not on file  . Stress: Not on file  Relationships  . Social connections:    Talks on phone: Not on file    Gets together: Not on file    Attends religious service: Not on file    Active member of club or organization: Not on file    Attends meetings of clubs or organizations: Not on file    Relationship status: Not on file  . Intimate partner violence:    Fear of current or ex partner: Not on file    Emotionally abused: Not on file    Physically abused: Not on file    Forced sexual activity: Not on file  Other Topics Concern  . Not on file  Social History Narrative   Retired widowed 1 son one daughter   4 caffeinated beverages daily   07/01/2017    Outpatient Medications Prior to Visit  Medication Sig Dispense Refill  . amoxicillin (AMOXIL) 500 MG capsule Take 2,000 mg by  mouth as needed. only for dental procedeure    . Cholecalciferol (VITAMIN D) 2000 units CAPS Take 1 capsule by mouth daily.    . ferrous sulfate 325 (65 FE) MG tablet Take 1 tablet (325 mg total) by mouth 2 (two) times daily before a meal. 60 tablet 6  . levothyroxine (SYNTHROID, LEVOTHROID) 75 MCG tablet Take 1 tablet (75 mcg total) by mouth daily. 90 tablet 3  . lisinopril (PRINIVIL,ZESTRIL) 10 MG tablet Take 1 tablet (10 mg total) by mouth daily. 30 tablet 0   No facility-administered medications prior to visit.     Allergies  Allergen Reactions  . Aspirin     REACTION: pt states that it irritates her stomach, coated ASA is ok    Review of Systems  Constitutional: Positive for malaise/fatigue. Negative for fever.  HENT: Negative for congestion.   Eyes: Negative for blurred vision.  Respiratory: Negative for cough and shortness of breath.   Cardiovascular: Negative for chest pain, palpitations and leg swelling.  Gastrointestinal: Negative for vomiting.  Musculoskeletal: Negative for back pain.  Skin: Negative for rash.  Neurological: Negative for loss of  consciousness and headaches.       Objective:    Physical Exam  Constitutional: She is oriented to person, place, and time. She appears well-developed and well-nourished.  HENT:  Head: Normocephalic and atraumatic.  Eyes: Conjunctivae and EOM are normal.  Neck: Normal range of motion. Neck supple. No JVD present. Carotid bruit is not present. No thyromegaly present.  Cardiovascular: Normal rate, regular rhythm and normal heart sounds.  No murmur heard. Pulmonary/Chest: Effort normal and breath sounds normal. No respiratory distress. She has no wheezes. She has no rales. She exhibits no tenderness.  Musculoskeletal: She exhibits no edema.  Neurological: She is alert and oriented to person, place, and time.  Psychiatric: She has a normal mood and affect.  Nursing note and vitals reviewed.   BP 120/70   Pulse 92   Temp 98.3 F (36.8 C) (Oral)   Resp 16   Ht 5\' 2"  (1.575 m)   Wt 209 lb (94.8 kg)   SpO2 97%   BMI 38.23 kg/m  Wt Readings from Last 3 Encounters:  03/18/18 209 lb (94.8 kg)  03/03/18 211 lb 4 oz (95.8 kg)  02/17/18 215 lb (97.5 kg)   BP Readings from Last 3 Encounters:  03/18/18 120/70  03/03/18 136/66  02/17/18 (!) 113/43     Immunization History  Administered Date(s) Administered  . Pneumococcal Conjugate-13 03/17/2017  . Pneumococcal Polysaccharide-23 02/27/2010  . Td 03/23/2008    Health Maintenance  Topic Date Due  . MAMMOGRAM  08/11/2014  . INFLUENZA VACCINE  03/21/2018 (Originally 07/22/2017)  . TETANUS/TDAP  03/23/2018  . Fecal DNA (Cologuard)  03/26/2020  . DEXA SCAN  Completed  . PNA vac Low Risk Adult  Completed    Lab Results  Component Value Date   WBC 6.4 03/18/2018   HGB 8.6 (L) 03/18/2018   HCT 27.0 (L) 03/18/2018   PLT 223 03/18/2018   GLUCOSE 99 02/17/2018   CHOL 154 10/01/2017   TRIG 71.0 10/01/2017   HDL 39.10 10/01/2017   LDLCALC 100 (H) 10/01/2017   ALT 11 12/28/2017   AST 16 12/28/2017   NA 129 (L) 02/17/2018   K 4.5  02/17/2018   CL 96 02/17/2018   CREATININE 0.62 02/17/2018   BUN 8 02/17/2018   CO2 26 02/17/2018   TSH 1.16 03/18/2018    Lab Results  Component Value  Date   TSH 1.16 03/18/2018   Lab Results  Component Value Date   WBC 6.4 03/18/2018   HGB 8.6 (L) 03/18/2018   HCT 27.0 (L) 03/18/2018   MCV 74.4 (L) 03/18/2018   PLT 223 03/18/2018   Lab Results  Component Value Date   NA 129 (L) 02/17/2018   K 4.5 02/17/2018   CO2 26 02/17/2018   GLUCOSE 99 02/17/2018   BUN 8 02/17/2018   CREATININE 0.62 02/17/2018   BILITOT 0.6 12/28/2017   ALKPHOS 84 12/28/2017   AST 16 12/28/2017   ALT 11 12/28/2017   PROT 6.4 12/28/2017   ALBUMIN 3.7 12/28/2017   CALCIUM 8.9 02/17/2018   GFR 99.52 02/17/2018   Lab Results  Component Value Date   CHOL 154 10/01/2017   Lab Results  Component Value Date   HDL 39.10 10/01/2017   Lab Results  Component Value Date   LDLCALC 100 (H) 10/01/2017   Lab Results  Component Value Date   TRIG 71.0 10/01/2017   Lab Results  Component Value Date   CHOLHDL 4 10/01/2017   No results found for: HGBA1C       Assessment & Plan:   Problem List Items Addressed This Visit      Unprioritized   Essential hypertension, benign    Well controlled, no changes to meds. Encouraged heart healthy diet such as the DASH diet and exercise as tolerated.       Relevant Medications   lisinopril-hydrochlorothiazide (PRINZIDE,ZESTORETIC) 10-12.5 MG tablet   HYPERLIPIDEMIA    Encouraged heart healthy diet, increase exercise, avoid trans fats, consider a krill oil cap daily      Relevant Medications   lisinopril-hydrochlorothiazide (PRINZIDE,ZESTORETIC) 10-12.5 MG tablet   Iron deficiency anemia - Primary    Pt to see hematology  con't iron for now       Relevant Medications   cyanocobalamin ((VITAMIN B-12)) injection 1,000 mcg (Completed)   Other Relevant Orders   CBC with Differential/Platelet   IBC panel (Completed)   Ferritin (Completed)      Other Visit Diagnoses    Night sweats       Relevant Orders   TSH (Completed)   Essential hypertension       Relevant Medications   lisinopril-hydrochlorothiazide (PRINZIDE,ZESTORETIC) 10-12.5 MG tablet      I have discontinued Timarie R. Cinquemani's lisinopril. I am also having her start on lisinopril-hydrochlorothiazide. Additionally, I am having her maintain her amoxicillin, Vitamin D, levothyroxine, and ferrous sulfate. We administered cyanocobalamin.  Meds ordered this encounter  Medications  . lisinopril-hydrochlorothiazide (PRINZIDE,ZESTORETIC) 10-12.5 MG tablet    Sig: Take 1 tablet by mouth daily.    Dispense:  90 tablet    Refill:  3  . cyanocobalamin ((VITAMIN B-12)) injection 1,000 mcg    CMA served as scribe during this visit. History, Physical and Plan performed by medical provider. Documentation and orders reviewed and attested to.  Ann Held, DO

## 2018-03-18 NOTE — Assessment & Plan Note (Signed)
Well controlled, no changes to meds. Encouraged heart healthy diet such as the DASH diet and exercise as tolerated.  °

## 2018-03-22 ENCOUNTER — Other Ambulatory Visit: Payer: Self-pay | Admitting: Family Medicine

## 2018-03-22 DIAGNOSIS — D509 Iron deficiency anemia, unspecified: Secondary | ICD-10-CM

## 2018-03-25 ENCOUNTER — Ambulatory Visit (INDEPENDENT_AMBULATORY_CARE_PROVIDER_SITE_OTHER): Payer: Medicare Other | Admitting: *Deleted

## 2018-03-25 ENCOUNTER — Other Ambulatory Visit: Payer: Self-pay | Admitting: Family

## 2018-03-25 DIAGNOSIS — D509 Iron deficiency anemia, unspecified: Secondary | ICD-10-CM | POA: Diagnosis not present

## 2018-03-25 DIAGNOSIS — D649 Anemia, unspecified: Secondary | ICD-10-CM

## 2018-03-25 MED ORDER — CYANOCOBALAMIN 1000 MCG/ML IJ SOLN
1000.0000 ug | Freq: Once | INTRAMUSCULAR | Status: AC
  Administered 2018-03-25: 1000 ug via INTRAMUSCULAR

## 2018-03-25 NOTE — Progress Notes (Signed)
Pre visit review using our clinic review tool, if applicable. No additional management support is needed unless otherwise documented below in the visit note.  Pt here for 2nd weekly B12 injection per 03/18/18 OV with Dr Carollee Herter.  Pt states she will receive B12 injections once a week for 4 weeks and then it will be decided if injections will be continued after that time.  B12 1086mcg Given, IM right deltoid and pt tolerated injection well.  Pt already has next injection scheduled for 04/01/18.

## 2018-03-25 NOTE — Progress Notes (Signed)
Kristina Held, DO

## 2018-03-26 ENCOUNTER — Inpatient Hospital Stay: Payer: Medicare Other | Attending: Family | Admitting: Family

## 2018-03-26 ENCOUNTER — Inpatient Hospital Stay: Payer: Medicare Other

## 2018-03-26 ENCOUNTER — Other Ambulatory Visit: Payer: Self-pay

## 2018-03-26 ENCOUNTER — Encounter: Payer: Self-pay | Admitting: Family

## 2018-03-26 VITALS — BP 144/59 | HR 96 | Temp 98.2°F | Resp 17 | Wt 205.0 lb

## 2018-03-26 DIAGNOSIS — R011 Cardiac murmur, unspecified: Secondary | ICD-10-CM | POA: Diagnosis not present

## 2018-03-26 DIAGNOSIS — D509 Iron deficiency anemia, unspecified: Secondary | ICD-10-CM | POA: Diagnosis not present

## 2018-03-26 DIAGNOSIS — F329 Major depressive disorder, single episode, unspecified: Secondary | ICD-10-CM | POA: Diagnosis not present

## 2018-03-26 DIAGNOSIS — Z79899 Other long term (current) drug therapy: Secondary | ICD-10-CM | POA: Diagnosis not present

## 2018-03-26 DIAGNOSIS — Z0000 Encounter for general adult medical examination without abnormal findings: Secondary | ICD-10-CM

## 2018-03-26 DIAGNOSIS — K219 Gastro-esophageal reflux disease without esophagitis: Secondary | ICD-10-CM | POA: Diagnosis not present

## 2018-03-26 DIAGNOSIS — E079 Disorder of thyroid, unspecified: Secondary | ICD-10-CM | POA: Diagnosis not present

## 2018-03-26 DIAGNOSIS — I10 Essential (primary) hypertension: Secondary | ICD-10-CM | POA: Insufficient documentation

## 2018-03-26 DIAGNOSIS — M1990 Unspecified osteoarthritis, unspecified site: Secondary | ICD-10-CM | POA: Insufficient documentation

## 2018-03-26 DIAGNOSIS — D508 Other iron deficiency anemias: Secondary | ICD-10-CM

## 2018-03-26 DIAGNOSIS — D649 Anemia, unspecified: Secondary | ICD-10-CM

## 2018-03-26 DIAGNOSIS — E785 Hyperlipidemia, unspecified: Secondary | ICD-10-CM | POA: Insufficient documentation

## 2018-03-26 LAB — CMP (CANCER CENTER ONLY)
ALBUMIN: 3.1 g/dL — AB (ref 3.5–5.0)
ALT: 13 U/L (ref 10–47)
ANION GAP: 10 (ref 5–15)
AST: 24 U/L (ref 11–38)
Alkaline Phosphatase: 93 U/L — ABNORMAL HIGH (ref 26–84)
BUN: 9 mg/dL (ref 7–22)
CALCIUM: 9.1 mg/dL (ref 8.0–10.3)
CO2: 28 mmol/L (ref 18–33)
CREATININE: 0.8 mg/dL (ref 0.60–1.20)
Chloride: 93 mmol/L — ABNORMAL LOW (ref 98–108)
GLUCOSE: 103 mg/dL (ref 73–118)
Potassium: 3.5 mmol/L (ref 3.3–4.7)
SODIUM: 131 mmol/L (ref 128–145)
Total Bilirubin: 0.4 mg/dL (ref 0.2–1.6)
Total Protein: 7.6 g/dL (ref 6.4–8.1)

## 2018-03-26 LAB — CBC WITH DIFFERENTIAL (CANCER CENTER ONLY)
Basophils Absolute: 0 10*3/uL (ref 0.0–0.1)
Basophils Relative: 1 %
EOS ABS: 0.1 10*3/uL (ref 0.0–0.5)
EOS PCT: 1 %
HCT: 27.1 % — ABNORMAL LOW (ref 34.8–46.6)
HEMOGLOBIN: 8.7 g/dL — AB (ref 11.6–15.9)
LYMPHS ABS: 2.4 10*3/uL (ref 0.9–3.3)
Lymphocytes Relative: 34 %
MCH: 24.2 pg — AB (ref 26.0–34.0)
MCHC: 32.1 g/dL (ref 32.0–36.0)
MCV: 75.5 fL — ABNORMAL LOW (ref 81.0–101.0)
Monocytes Absolute: 0.9 10*3/uL (ref 0.1–0.9)
Monocytes Relative: 14 %
NEUTROS PCT: 50 %
Neutro Abs: 3.5 10*3/uL (ref 1.5–6.5)
PLATELETS: 221 10*3/uL (ref 145–400)
RBC: 3.59 MIL/uL — ABNORMAL LOW (ref 3.70–5.32)
RDW: 16.8 % — ABNORMAL HIGH (ref 11.1–15.7)
WBC Count: 6.9 10*3/uL (ref 3.9–10.0)

## 2018-03-26 LAB — RETICULOCYTES
RBC.: 3.6 MIL/uL — AB (ref 3.87–5.11)
Retic Count, Absolute: 144 10*3/uL (ref 19.0–186.0)
Retic Ct Pct: 4 % — ABNORMAL HIGH (ref 0.4–3.1)

## 2018-03-26 LAB — SAVE SMEAR

## 2018-03-26 NOTE — Progress Notes (Signed)
Hematology/Oncology Consultation   Name: Kristina Ramos      MRN: [id number]    Location: Room/bed info not found  Date: 03/26/2018 Time:3:06 PM   REFERRING PHYSICIAN: Roma Schanz, DO  REASON FOR CONSULT: Iron deficiency anemia    DIAGNOSIS: Iron deficiency anemia  HISTORY OF PRESENT ILLNESS: Kristina Ramos is a very pleasant 77 yo caucasian female with iron deficiency anemia recently diagnosed on lab work with PCP office. Ferritin is 66.5 with iron saturation 4%.  She is symptomatic with fatigue, hot flashes and night sweats, SOB with exertion and chewing ice.  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  She had her endoscopy and sigmoidoscopy in February. Endo was negative, she had one benign rectal polyp removed. No evidence of bleed.  She has not noted any bleeding, no bruising or petechiae.  Her cycle when she had one was regular and quite heavy. She went through menopause without difficulty.   She has 2 children and had no difficulty with her pregnancies. No miscarriages. Female organs are still intact and she has never been on HRT.  No family history of anemia that she is aware of.  No personal history of cancer. Her father was a smoker and had lung cancer.  She has never been a smoker and does not drink alcoholic beverages.  She has maintained a good appetite and is staying well hydrated. Her weight is stable.  She has had no issue with infections. No fever, chills, n/v, cough, rash, dizziness, chest pain, palpitations, abdominal pain or changes in bladder habits.  No swelling, tenderness, numbness or tingling. No c/o pain.  She has hypothyroidism and is on Synthroid. She states that this is managed effectively. TSH in late March was 1.16.  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  She needs to have her mammogram. Her last one was in 2014.   ROS: All other 10  point review of systems is negative.   PAST MEDICAL HISTORY:   Past Medical History:  Diagnosis Date  . Anemia   . Arthritis   . Depression   . GERD (gastroesophageal reflux disease)   . Heart murmur   . Hx of adenomatous colonic polyps 07/28/2017  . Hyperlipidemia   . Hypertension   . Thyroid disease     ALLERGIES: Allergies  Allergen Reactions  . Aspirin     REACTION: pt states that it irritates her stomach, coated ASA is ok      MEDICATIONS:  Current Outpatient Medications on File Prior to Visit  Medication Sig Dispense Refill  . amoxicillin (AMOXIL) 500 MG capsule Take 2,000 mg by mouth as needed. only for dental procedeure    . Cholecalciferol (VITAMIN D) 2000 units CAPS Take 1 capsule by mouth daily.    . ferrous sulfate 325 (65 FE) MG tablet Take 1 tablet (325 mg total) by mouth 2 (two) times daily before a meal. 60 tablet 6  . levothyroxine (SYNTHROID, LEVOTHROID) 75 MCG tablet Take 1 tablet (75 mcg total) by mouth daily. 90 tablet 3  . lisinopril-hydrochlorothiazide (PRINZIDE,ZESTORETIC) 10-12.5 MG tablet Take 1 tablet by mouth daily. 90 tablet 3   No current facility-administered medications on file prior to visit.      PAST SURGICAL HISTORY Past Surgical History:  Procedure Laterality Date  . TONSILLECTOMY    . TOTAL KNEE ARTHROPLASTY Left 04/25/2014  . TUBAL LIGATION  FAMILY HISTORY: Family History  Problem Relation Age of Onset  . Diabetes Maternal Grandmother   . Diabetes Paternal Aunt   . Lung cancer Father        smoker  . Heart disease Father   . CAD Mother   . Cataracts Mother   . Thyroid disease Maternal Aunt   . Colon cancer Neg Hx   . Esophageal cancer Neg Hx   . Pancreatic cancer Neg Hx   . Rectal cancer Neg Hx   . Stomach cancer Neg Hx     SOCIAL HISTORY:  reports that she has never smoked. She has never used smokeless tobacco. She reports that she does not drink alcohol or use drugs.  PERFORMANCE STATUS: The patient's  performance status is 1 - Symptomatic but completely ambulatory  PHYSICAL EXAM: Most Recent Vital Signs: Blood pressure (!) 144/59, pulse 96, temperature 98.2 F (36.8 C), temperature source Oral, resp. rate 17, weight 205 lb (93 kg), SpO2 98 %. BP (!) 144/59 (BP Location: Left Arm, Patient Position: Sitting)   Pulse 96   Temp 98.2 F (36.8 C) (Oral)   Resp 17   Wt 205 lb (93 kg)   SpO2 98%   BMI 37.49 kg/m   General Appearance:    Alert, cooperative, no distress, appears stated age  Head:    Normocephalic, without obvious abnormality, atraumatic  Eyes:    PERRL, conjunctiva/corneas clear, EOM's intact, fundi    benign, both eyes        Throat:   Lips, mucosa, and tongue normal; teeth and gums normal  Neck:   Supple, symmetrical, trachea midline, no adenopathy;    thyroid:  no enlargement/tenderness/nodules; no carotid   bruit or JVD  Back:     Symmetric, no curvature, ROM normal, no CVA tenderness  Lungs:     Clear to auscultation bilaterally, respirations unlabored  Chest Wall:    No tenderness or deformity   Heart:    Regular rate and rhythm, S1 and S2 normal, no murmur, rub   or gallop     Abdomen:     Soft, non-tender, bowel sounds active all four quadrants,    no masses, no organomegaly        Extremities:   Extremities normal, atraumatic, no cyanosis or edema  Pulses:   2+ and symmetric all extremities  Skin:   Skin color, texture, turgor normal, no rashes or lesions  Lymph nodes:   Cervical, supraclavicular, and axillary nodes normal  Neurologic:   CNII-XII intact, normal strength, sensation and reflexes    throughout    LABORATORY DATA:  Results for orders placed or performed in visit on 03/26/18 (from the past 48 hour(s))  CBC with Differential (Cancer Center Only)     Status: Abnormal   Collection Time: 03/26/18  2:42 PM  Result Value Ref Range   WBC Count 6.9 3.9 - 10.0 K/uL   RBC 3.59 (L) 3.70 - 5.32 MIL/uL   Hemoglobin 8.7 (L) 11.6 - 15.9 g/dL   HCT  27.1 (L) 34.8 - 46.6 %   MCV 75.5 (L) 81.0 - 101.0 fL   MCH 24.2 (L) 26.0 - 34.0 pg   MCHC 32.1 32.0 - 36.0 g/dL   RDW 16.8 (H) 11.1 - 15.7 %   Platelet Count 221 145 - 400 K/uL   Neutrophils Relative % 50 %   Neutro Abs 3.5 1.5 - 6.5 K/uL   Lymphocytes Relative 34 %   Lymphs Abs 2.4 0.9 - 3.3 K/uL  Monocytes Relative 14 %   Monocytes Absolute 0.9 0.1 - 0.9 K/uL   Eosinophils Relative 1 %   Eosinophils Absolute 0.1 0.0 - 0.5 K/uL   Basophils Relative 1 %   Basophils Absolute 0.0 0.0 - 0.1 K/uL    Comment: Performed at [facility] at [facility], [address], [place], [place] [address]      RADIOGRAPHY: No results found.     PATHOLOGY: None  ASSESSMENT/PLAN: Ms. Netzer is a very pleasant 77 yo caucasian female with recently diagnosed iron deficiency anemia. Iron saturation is 4%. She is quite symptomatic as mentioned above.  We will give her IV iron next week and again the week after.  We will then plan to see her back for follow-up in 6 weeks.   All questions were answered and she is in agreement with the plan. She will contact our office with any questions or concerns. We can certainly see her sooner if need be.   She was discussed with and also seen by Dr. Marin Olp and he is in agreement with the aforementioned.   Laverna Peace, FNP-BC    Addendum: I saw and examined the patient with Judson Roch.  I agree with the above assessment.  She clearly is markedly iron deficient.  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  She has been tested for GI bleeding.  We will go ahead and plan to give her 2 doses of iron.  I think this will help her out.  We should see a nice bump in her blood count within 3-4 weeks.  We spent about 40 minutes with her.  We spent over half the time face-to-face.  We went over her labs.  We answered all of her questions.  We encouraged her that the iron will get better and that her blood count will get better  and she will feel better.  Lattie Haw, MD

## 2018-03-28 LAB — ERYTHROPOIETIN: ERYTHROPOIETIN: 29.7 m[IU]/mL — AB (ref 2.6–18.5)

## 2018-03-29 LAB — LACTATE DEHYDROGENASE: LDH: 238 U/L (ref 125–245)

## 2018-03-30 ENCOUNTER — Other Ambulatory Visit: Payer: Self-pay | Admitting: Family

## 2018-03-31 ENCOUNTER — Inpatient Hospital Stay: Payer: Medicare Other

## 2018-03-31 VITALS — BP 126/49 | HR 100 | Temp 98.1°F | Resp 16

## 2018-03-31 DIAGNOSIS — I10 Essential (primary) hypertension: Secondary | ICD-10-CM | POA: Diagnosis not present

## 2018-03-31 DIAGNOSIS — K219 Gastro-esophageal reflux disease without esophagitis: Secondary | ICD-10-CM | POA: Diagnosis not present

## 2018-03-31 DIAGNOSIS — D508 Other iron deficiency anemias: Secondary | ICD-10-CM

## 2018-03-31 DIAGNOSIS — Z79899 Other long term (current) drug therapy: Secondary | ICD-10-CM | POA: Diagnosis not present

## 2018-03-31 DIAGNOSIS — D509 Iron deficiency anemia, unspecified: Secondary | ICD-10-CM | POA: Diagnosis not present

## 2018-03-31 DIAGNOSIS — M1990 Unspecified osteoarthritis, unspecified site: Secondary | ICD-10-CM | POA: Diagnosis not present

## 2018-03-31 DIAGNOSIS — E785 Hyperlipidemia, unspecified: Secondary | ICD-10-CM | POA: Diagnosis not present

## 2018-03-31 MED ORDER — SODIUM CHLORIDE 0.9 % IV SOLN
Freq: Once | INTRAVENOUS | Status: AC
  Administered 2018-03-31: 13:00:00 via INTRAVENOUS

## 2018-03-31 MED ORDER — SODIUM CHLORIDE 0.9 % IV SOLN
510.0000 mg | Freq: Once | INTRAVENOUS | Status: AC
  Administered 2018-03-31: 510 mg via INTRAVENOUS
  Filled 2018-03-31: qty 17

## 2018-03-31 NOTE — Patient Instructions (Signed)

## 2018-04-01 ENCOUNTER — Ambulatory Visit (INDEPENDENT_AMBULATORY_CARE_PROVIDER_SITE_OTHER): Payer: Medicare Other

## 2018-04-01 DIAGNOSIS — D500 Iron deficiency anemia secondary to blood loss (chronic): Secondary | ICD-10-CM

## 2018-04-01 MED ORDER — CYANOCOBALAMIN 1000 MCG/ML IJ SOLN
1000.0000 ug | Freq: Once | INTRAMUSCULAR | Status: AC
Start: 2018-04-01 — End: 2018-04-01
  Administered 2018-04-01: 1000 ug via INTRAMUSCULAR

## 2018-04-01 NOTE — Progress Notes (Signed)
Pre visit review using our clinic review tool, if applicable. No additional management support is needed unless otherwise documented below in the visit note.  Pt here today for B12 injection. 77mL injected into R deltoid. Pt tolerated injection well.   Pt to return in 1 week for next B12 injection. She is already scheduled for 04/08/2018.

## 2018-04-07 ENCOUNTER — Inpatient Hospital Stay: Payer: Medicare Other

## 2018-04-07 VITALS — BP 107/56 | HR 89 | Temp 97.9°F | Resp 16

## 2018-04-07 DIAGNOSIS — K219 Gastro-esophageal reflux disease without esophagitis: Secondary | ICD-10-CM | POA: Diagnosis not present

## 2018-04-07 DIAGNOSIS — E785 Hyperlipidemia, unspecified: Secondary | ICD-10-CM | POA: Diagnosis not present

## 2018-04-07 DIAGNOSIS — Z79899 Other long term (current) drug therapy: Secondary | ICD-10-CM | POA: Diagnosis not present

## 2018-04-07 DIAGNOSIS — D508 Other iron deficiency anemias: Secondary | ICD-10-CM

## 2018-04-07 DIAGNOSIS — D509 Iron deficiency anemia, unspecified: Secondary | ICD-10-CM | POA: Diagnosis not present

## 2018-04-07 DIAGNOSIS — M1990 Unspecified osteoarthritis, unspecified site: Secondary | ICD-10-CM | POA: Diagnosis not present

## 2018-04-07 DIAGNOSIS — I10 Essential (primary) hypertension: Secondary | ICD-10-CM | POA: Diagnosis not present

## 2018-04-07 MED ORDER — SODIUM CHLORIDE 0.9 % IV SOLN
510.0000 mg | Freq: Once | INTRAVENOUS | Status: AC
  Administered 2018-04-07: 510 mg via INTRAVENOUS
  Filled 2018-04-07: qty 17

## 2018-04-07 MED ORDER — SODIUM CHLORIDE 0.9 % IV SOLN
Freq: Once | INTRAVENOUS | Status: AC
  Administered 2018-04-07: 13:00:00 via INTRAVENOUS

## 2018-04-07 NOTE — Patient Instructions (Signed)

## 2018-04-08 ENCOUNTER — Ambulatory Visit (INDEPENDENT_AMBULATORY_CARE_PROVIDER_SITE_OTHER): Payer: Medicare Other

## 2018-04-08 DIAGNOSIS — D500 Iron deficiency anemia secondary to blood loss (chronic): Secondary | ICD-10-CM

## 2018-04-08 MED ORDER — CYANOCOBALAMIN 1000 MCG/ML IJ SOLN
1000.0000 ug | Freq: Once | INTRAMUSCULAR | Status: AC
  Administered 2018-04-08: 1000 ug via INTRAMUSCULAR

## 2018-04-08 NOTE — Progress Notes (Signed)
Pre visit review using our clinic review tool, if applicable. No additional management support is needed unless otherwise documented below in the visit note.  Pt here today for 4th weekly B12 injection. Spoke w/ Dr. Etter Sjogren- she will now go to monthly B12 injections.   101mL injected into R deltoid. Pt tolerated injection well.   Next in 1 month. Pt requested we schedule next visit for 04/29/2018- as she will be over this way anyway. Appt scheduled for 0930.

## 2018-04-13 ENCOUNTER — Ambulatory Visit
Admission: RE | Admit: 2018-04-13 | Discharge: 2018-04-13 | Disposition: A | Discharge status: Home / Self Care | Payer: Medicare Other | Source: Ambulatory Visit | Attending: Family | Admitting: Family

## 2018-04-13 DIAGNOSIS — Z0000 Encounter for general adult medical examination without abnormal findings: Secondary | ICD-10-CM

## 2018-04-13 DIAGNOSIS — Z1231 Encounter for screening mammogram for malignant neoplasm of breast: Secondary | ICD-10-CM | POA: Diagnosis not present

## 2018-04-29 ENCOUNTER — Ambulatory Visit (INDEPENDENT_AMBULATORY_CARE_PROVIDER_SITE_OTHER): Payer: Medicare Other

## 2018-04-29 DIAGNOSIS — D500 Iron deficiency anemia secondary to blood loss (chronic): Secondary | ICD-10-CM

## 2018-04-29 MED ORDER — CYANOCOBALAMIN 1000 MCG/ML IJ SOLN
1000.0000 ug | Freq: Once | INTRAMUSCULAR | Status: AC
  Administered 2018-04-29: 1000 ug via INTRAMUSCULAR

## 2018-04-29 NOTE — Progress Notes (Signed)
Patient in today to have her monthly B-12 injection. Per Dr. Etter Sjogren.  Patient tolerated 1 mL into her right deltoid with no complications.   Patient scheduled for next month 06/01/18

## 2018-05-12 ENCOUNTER — Inpatient Hospital Stay (HOSPITAL_BASED_OUTPATIENT_CLINIC_OR_DEPARTMENT_OTHER): Payer: Medicare Other | Admitting: Family

## 2018-05-12 ENCOUNTER — Inpatient Hospital Stay: Payer: Medicare Other | Attending: Family

## 2018-05-12 VITALS — BP 132/68 | HR 95 | Temp 98.3°F | Resp 17 | Wt 195.0 lb

## 2018-05-12 DIAGNOSIS — R5383 Other fatigue: Secondary | ICD-10-CM | POA: Diagnosis not present

## 2018-05-12 DIAGNOSIS — Z79899 Other long term (current) drug therapy: Secondary | ICD-10-CM

## 2018-05-12 DIAGNOSIS — D509 Iron deficiency anemia, unspecified: Secondary | ICD-10-CM | POA: Diagnosis not present

## 2018-05-12 DIAGNOSIS — D508 Other iron deficiency anemias: Secondary | ICD-10-CM

## 2018-05-12 LAB — CBC WITH DIFFERENTIAL (CANCER CENTER ONLY)
Basophils Absolute: 0.1 10*3/uL (ref 0.0–0.1)
Basophils Relative: 1 %
Eosinophils Absolute: 0 10*3/uL (ref 0.0–0.5)
Eosinophils Relative: 1 %
HCT: 30.7 % — ABNORMAL LOW (ref 34.8–46.6)
Hemoglobin: 10 g/dL — ABNORMAL LOW (ref 11.6–15.9)
LYMPHS ABS: 3 10*3/uL (ref 0.9–3.3)
LYMPHS PCT: 41 %
MCH: 26.2 pg (ref 26.0–34.0)
MCHC: 32.6 g/dL (ref 32.0–36.0)
MCV: 80.4 fL — AB (ref 81.0–101.0)
Monocytes Absolute: 0.8 10*3/uL (ref 0.1–0.9)
Monocytes Relative: 11 %
Neutro Abs: 3.5 10*3/uL (ref 1.5–6.5)
Neutrophils Relative %: 46 %
PLATELETS: 175 10*3/uL (ref 145–400)
RBC: 3.82 MIL/uL (ref 3.70–5.32)
RDW: 18.7 % — ABNORMAL HIGH (ref 11.1–15.7)
WBC: 7.3 10*3/uL (ref 3.9–10.0)

## 2018-05-12 LAB — CMP (CANCER CENTER ONLY)
ALBUMIN: 3 g/dL — AB (ref 3.5–5.0)
ALK PHOS: 108 U/L (ref 40–150)
ALT: 11 U/L (ref 0–55)
AST: 21 U/L (ref 5–34)
Anion gap: 11 (ref 3–11)
BUN: 9 mg/dL (ref 7–26)
CALCIUM: 9.2 mg/dL (ref 8.4–10.4)
CO2: 24 mmol/L (ref 22–29)
CREATININE: 0.74 mg/dL (ref 0.60–1.10)
Chloride: 94 mmol/L — ABNORMAL LOW (ref 98–109)
GFR, Estimated: >60 mL/min (ref >60)
GLUCOSE: 101 mg/dL (ref 70–140)
Potassium: 4.4 mmol/L (ref 3.5–5.1)
SODIUM: 129 mmol/L — AB (ref 136–145)
Total Bilirubin: 0.4 mg/dL (ref 0.2–1.2)
Total Protein: 8 g/dL (ref 6.4–8.3)

## 2018-05-12 LAB — RETICULOCYTES
RBC.: 3.72 MIL/uL (ref 3.70–5.45)
Retic Count, Absolute: 104.2 10*3/uL — ABNORMAL HIGH (ref 33.7–90.7)
Retic Ct Pct: 2.8 % — ABNORMAL HIGH (ref 0.7–2.1)

## 2018-05-12 NOTE — Progress Notes (Signed)
Hematology and Oncology Follow Up Visit  Kristina Ramos [id number] June 24, 1941 77 y.o. 05/12/2018   Principle Diagnosis:  Iron deficiency anemia   Current Therapy:   IV iron as indicated - last received in April 2019 x 2    Interim History:  Kristina Ramos is here today for follow-up. She is feeling better after receiving 2 doses of IV iron in April.  She still has some fatigue and chewing ice. She gets hot with activity and has had a few episodes of night sweats. This comes and goes.  No fever, chills, n/v, cough, rash, dizziness, SOB, chest pain, palpitations, abdominal pain or changes in bowel or bladder habits.  No lymphadenopathy noted on exam.  No episodes of bleeding, no bruising or petechiae.  No tenderness, numbness or tingling in her extremities. She has occasional puffiness in her feet and ankles that improves if she elevates her feet.  She has a good appetite and is staying well hydrated. His weight is stable.   ECOG Performance Status: 1 - Symptomatic but completely ambulatory  Medications:  Allergies as of 05/12/2018      Reactions   Aspirin    REACTION: pt states that it irritates her stomach, coated ASA is ok      Medication List        Accurate as of 05/12/18  2:23 PM. Always use your most recent med list.          amoxicillin 500 MG capsule Commonly known as:  AMOXIL Take 2,000 mg by mouth as needed. only for dental procedeure   levothyroxine 75 MCG tablet Commonly known as:  SYNTHROID, LEVOTHROID Take 1 tablet (75 mcg total) by mouth daily.   lisinopril-hydrochlorothiazide 10-12.5 MG tablet Commonly known as:  PRINZIDE,ZESTORETIC Take 1 tablet by mouth daily.   Vitamin D 2000 units Caps Take 1 capsule by mouth daily.       Allergies:  Allergies  Allergen Reactions  . Aspirin     REACTION: pt states that it irritates her stomach, coated ASA is ok    Past Medical History, Surgical history, Social history, and Family History were reviewed and  updated.  Review of Systems: All other 10 point review of systems is negative.   Physical Exam:  weight is 195 lb (88.5 kg). Her oral temperature is 98.3 F (36.8 C). Her blood pressure is 132/68 and her pulse is 95. Her respiration is 17 and oxygen saturation is 100%.   Wt Readings from Last 3 Encounters:  05/12/18 195 lb (88.5 kg)  03/26/18 205 lb (93 kg)  03/18/18 209 lb (94.8 kg)    Ocular: Sclerae unicteric, pupils equal, round and reactive to light Ear-nose-throat: Oropharynx clear, dentition fair Lymphatic: No cervical, supraclavicular or axillary adenopathy Lungs no rales or rhonchi, good excursion bilaterally Heart regular rate and rhythm, no murmur appreciated Abd soft, nontender, positive bowel sounds, no liver or spleen tip palpated on exam, no fluid wave  MSK no focal spinal tenderness, no joint edema Neuro: non-focal, well-oriented, appropriate affect Breasts: Deferred   Lab Results  Component Value Date   WBC 7.3 05/12/2018   HGB 10.0 (L) 05/12/2018   HCT 30.7 (L) 05/12/2018   MCV 80.4 (L) 05/12/2018   PLT 175 05/12/2018   Lab Results  Component Value Date   FERRITIN 66.5 03/18/2018   IRON 14 (L) 03/18/2018   IRONPCTSAT 4.3 (L) 03/18/2018   Lab Results  Component Value Date   RETICCTPCT 4.0 (H) 03/26/2018   RBC 3.82 05/12/2018  RETICCTABS 105,600 (H) 03/03/2018   No results found for: KPAFRELGTCHN, LAMBDASER, KAPLAMBRATIO No results found for: IGGSERUM, IGA, IGMSERUM No results found for: Odetta Pink, SPEI   Chemistry      Component Value Date/Time   NA 131 03/26/2018 1442   K 3.5 03/26/2018 1442   CL 93 (L) 03/26/2018 1442   CO2 28 03/26/2018 1442   BUN 9 03/26/2018 1442   CREATININE 0.80 03/26/2018 1442      Component Value Date/Time   CALCIUM 9.1 03/26/2018 1442   ALKPHOS 93 (H) 03/26/2018 1442   AST 24 03/26/2018 1442   ALT 13 03/26/2018 1442   BILITOT 0.4 03/26/2018 1442       Impression and Plan: Kristina Ramos is a very pleasant 77 yo caucasian female with iron deficiency anemia. Her symptoms have improved since she received 2 doses of IV iron in April. She still has fatigue and is chewing ice.  We will see what her iron studies show and bring her back in for infusion if needed.  We will plan to see her back in another 2 months for follow-up.  She will contact our office with any questions or concerns. We can certainly see her sooner if need be.   Kristina Peace, NP 5/22/20192:23 PM

## 2018-05-13 LAB — IRON AND TIBC
IRON: 22 ug/dL — AB (ref 41–142)
Saturation Ratios: 10 % — ABNORMAL LOW (ref 21–57)
TIBC: 229 ug/dL — AB (ref 236–444)
UIBC: 206 ug/dL

## 2018-05-13 LAB — FERRITIN: Ferritin: 224 ng/mL (ref 9–269)

## 2018-05-25 ENCOUNTER — Inpatient Hospital Stay: Payer: Medicare Other

## 2018-06-01 ENCOUNTER — Other Ambulatory Visit: Payer: Self-pay

## 2018-06-01 ENCOUNTER — Inpatient Hospital Stay: Payer: Medicare Other | Attending: Hematology & Oncology

## 2018-06-01 ENCOUNTER — Ambulatory Visit (INDEPENDENT_AMBULATORY_CARE_PROVIDER_SITE_OTHER): Payer: Medicare Other

## 2018-06-01 VITALS — BP 123/63 | HR 91 | Temp 98.1°F | Resp 16

## 2018-06-01 DIAGNOSIS — E538 Deficiency of other specified B group vitamins: Secondary | ICD-10-CM

## 2018-06-01 DIAGNOSIS — D509 Iron deficiency anemia, unspecified: Secondary | ICD-10-CM | POA: Insufficient documentation

## 2018-06-01 DIAGNOSIS — D508 Other iron deficiency anemias: Secondary | ICD-10-CM

## 2018-06-01 MED ORDER — CYANOCOBALAMIN 1000 MCG/ML IJ SOLN: 1000.0000 ug | Freq: Once | INTRAMUSCULAR | 0 refills | Status: DC

## 2018-06-01 MED ORDER — SODIUM CHLORIDE 0.9 % IV SOLN
510.0000 mg | Freq: Once | INTRAVENOUS | Status: AC
  Administered 2018-06-01: 510 mg via INTRAVENOUS
  Filled 2018-06-01: qty 17

## 2018-06-01 MED ORDER — CYANOCOBALAMIN 1000 MCG/ML IJ SOLN
1000.0000 ug | Freq: Once | INTRAMUSCULAR | Status: AC
  Administered 2018-06-01: 1000 ug via INTRAMUSCULAR

## 2018-06-01 NOTE — Progress Notes (Signed)
Reviewed  Yvonne R Lowne Chase, DO  

## 2018-06-01 NOTE — Progress Notes (Signed)
Pre visit review using our clinic tool,if applicable. No additional management support is needed unless otherwise documented below in the visit note.   Pt here for monthly B12 injection per order from Dr. Roma Schanz.  B12 1023mcg given IM left deltoid and pt tolerated injection well. No complaints voiced this visit.   Next B12 injection scheduled for 1 month July 01 2018 @ 10:00 .

## 2018-06-01 NOTE — Patient Instructions (Signed)

## 2018-06-08 ENCOUNTER — Inpatient Hospital Stay: Payer: Medicare Other

## 2018-06-08 VITALS — BP 122/58 | HR 86 | Temp 97.7°F | Resp 18

## 2018-06-08 DIAGNOSIS — D509 Iron deficiency anemia, unspecified: Secondary | ICD-10-CM | POA: Diagnosis not present

## 2018-06-08 DIAGNOSIS — D508 Other iron deficiency anemias: Secondary | ICD-10-CM

## 2018-06-08 MED ORDER — FERUMOXYTOL INJECTION 510 MG/17 ML
510.0000 mg | Freq: Once | INTRAVENOUS | Status: AC
  Administered 2018-06-08: 510 mg via INTRAVENOUS
  Filled 2018-06-08: qty 17

## 2018-07-01 ENCOUNTER — Ambulatory Visit (INDEPENDENT_AMBULATORY_CARE_PROVIDER_SITE_OTHER): Payer: Medicare Other

## 2018-07-01 DIAGNOSIS — E538 Deficiency of other specified B group vitamins: Secondary | ICD-10-CM

## 2018-07-01 MED ORDER — CYANOCOBALAMIN 1000 MCG/ML IJ SOLN
1000.0000 ug | Freq: Once | INTRAMUSCULAR | Status: AC
  Administered 2018-07-01: 1000 ug via INTRAMUSCULAR

## 2018-07-01 NOTE — Progress Notes (Signed)
Pre visit review using our clinic tool,if applicable. No additional management support is needed unless otherwise documented below in the visit note.   Pt here for monthly B12 injection per order from Dr. Carollee Herter.  B12 1080mcg given IM right deltoid, and pt tolerated injection well.  No complaints this visit.  Next B12 injection scheduled for August 15,2019. Patient aware.

## 2018-07-14 ENCOUNTER — Other Ambulatory Visit: Payer: Self-pay

## 2018-07-14 ENCOUNTER — Inpatient Hospital Stay: Payer: Medicare Other | Attending: Hematology & Oncology | Admitting: Family

## 2018-07-14 ENCOUNTER — Inpatient Hospital Stay: Payer: Medicare Other

## 2018-07-14 VITALS — BP 117/60 | HR 100 | Temp 98.3°F | Resp 17 | Wt 181.5 lb

## 2018-07-14 DIAGNOSIS — D509 Iron deficiency anemia, unspecified: Secondary | ICD-10-CM | POA: Insufficient documentation

## 2018-07-14 DIAGNOSIS — D508 Other iron deficiency anemias: Secondary | ICD-10-CM

## 2018-07-14 LAB — CBC WITH DIFFERENTIAL (CANCER CENTER ONLY)
BASOS ABS: 0 10*3/uL (ref 0.0–0.1)
Basophils Relative: 0 %
EOS ABS: 0 10*3/uL (ref 0.0–0.5)
EOS PCT: 0 %
HCT: 32.2 % — ABNORMAL LOW (ref 34.8–46.6)
Hemoglobin: 10.6 g/dL — ABNORMAL LOW (ref 11.6–15.9)
LYMPHS ABS: 2.8 10*3/uL (ref 0.9–3.3)
Lymphocytes Relative: 37 %
MCH: 28.1 pg (ref 26.0–34.0)
MCHC: 32.9 g/dL (ref 32.0–36.0)
MCV: 85.4 fL (ref 81.0–101.0)
MONO ABS: 0.9 10*3/uL (ref 0.1–0.9)
Monocytes Relative: 12 %
Neutro Abs: 3.8 10*3/uL (ref 1.5–6.5)
Neutrophils Relative %: 51 %
PLATELETS: 179 10*3/uL (ref 145–400)
RBC: 3.77 MIL/uL (ref 3.70–5.32)
RDW: 17.3 % — AB (ref 11.1–15.7)
WBC Count: 7.5 10*3/uL (ref 3.9–10.0)

## 2018-07-14 LAB — RETICULOCYTES
RBC.: 3.65 MIL/uL — ABNORMAL LOW (ref 3.70–5.45)
RETIC COUNT ABSOLUTE: 131.4 10*3/uL — AB (ref 33.7–90.7)
RETIC CT PCT: 3.6 % — AB (ref 0.7–2.1)

## 2018-07-14 NOTE — Progress Notes (Signed)
Hematology and Oncology Follow Up Visit  Kristina Ramos [id number] 1941/04/15 77 y.o. 07/14/2018   Principle Diagnosis:  Iron deficiency anemia   Current Therapy:   IV iron as indicated - last received in June 2019 x 2    Interim History: Kristina Ramos is here today for follow-up. She is doing well and feels much better since receiving another 2 doses of IV iron in June. Hgb is stable at 10.6 and MCV is up to 85. She has had no episodes of bleeding, no bruising or petechiae.  She has some night sweats off and on. She states that she has always been hot natured.  No fever, chills, n/v, cough, rash, dizziness, SOB, chest pain, palpitations, abdominal pain or changes in bowel or bladder habits.  She has chronic swelling in her left ankle since having knee surgery. No tenderness, numbness or tingling in her extremities at this time. No c/o pain.   No lymphadenopathy noted on exam.  She has also noticed that her appetite has improved and she is staying well hydrated. Her weight is stable.  She is going to the [facility] some to exercise.   ECOG Performance Status: 1 - Symptomatic but completely ambulatory  Medications:  Allergies as of 07/14/2018      Reactions   Aspirin    REACTION: pt states that it irritates her stomach, coated ASA is ok      Medication List        Accurate as of 07/14/18  1:45 PM. Always use your most recent med list.          amoxicillin 500 MG capsule Commonly known as:  AMOXIL Take 2,000 mg by mouth as needed. only for dental procedeure   levothyroxine 75 MCG tablet Commonly known as:  SYNTHROID, LEVOTHROID Take 1 tablet (75 mcg total) by mouth daily.   lisinopril-hydrochlorothiazide 10-12.5 MG tablet Commonly known as:  PRINZIDE,ZESTORETIC Take 1 tablet by mouth daily.   Vitamin D 2000 units Caps Take 1 capsule by mouth daily.       Allergies:  Allergies  Allergen Reactions  . Aspirin     REACTION: pt states that it irritates her stomach, coated ASA  is ok    Past Medical History, Surgical history, Social history, and Family History were reviewed and updated.  Review of Systems: All other 10 point review of systems is negative.   Physical Exam:  weight is 181 lb 8 oz (82.3 kg). Her oral temperature is 98.3 F (36.8 C). Her blood pressure is 117/60 and her pulse is 100. Her respiration is 17 and oxygen saturation is 97%.   Wt Readings from Last 3 Encounters:  07/14/18 181 lb 8 oz (82.3 kg)  05/12/18 195 lb (88.5 kg)  03/26/18 205 lb (93 kg)    Ocular: Sclerae unicteric, pupils equal, round and reactive to light Ear-nose-throat: Oropharynx clear, dentition fair Lymphatic: No cervical, supraclavicular or axillary adenopathy Lungs no rales or rhonchi, good excursion bilaterally Heart regular rate and rhythm, no murmur appreciated Abd soft, nontender, positive bowel sounds, no liver or spleen tip palpated on exam, no fluid wave  MSK no focal spinal tenderness, no joint edema Neuro: non-focal, well-oriented, appropriate affect Breasts: Deferred   Lab Results  Component Value Date   WBC 7.5 07/14/2018   HGB 10.6 (L) 07/14/2018   HCT 32.2 (L) 07/14/2018   MCV 85.4 07/14/2018   PLT 179 07/14/2018   Lab Results  Component Value Date   FERRITIN 224 05/12/2018  IRON 22 (L) 05/12/2018   TIBC 229 (L) 05/12/2018   UIBC 206 05/12/2018   IRONPCTSAT 10 (L) 05/12/2018   Lab Results  Component Value Date   RETICCTPCT 2.8 (H) 05/12/2018   RBC 3.77 07/14/2018   RETICCTABS 105,600 (H) 03/03/2018   No results found for: KPAFRELGTCHN, LAMBDASER, KAPLAMBRATIO No results found for: IGGSERUM, IGA, IGMSERUM No results found for: TOTALPROTELP, ALBUMINELP, A1GS, A2GS, BETS, BETA2SER, GAMS, MSPIKE, SPEI   Chemistry      Component Value Date/Time   NA 129 (L) 05/12/2018 1345   K 4.4 05/12/2018 1345   CL 94 (L) 05/12/2018 1345   CO2 24 05/12/2018 1345   BUN 9 05/12/2018 1345   CREATININE 0.74 05/12/2018 1345      Component Value  Date/Time   CALCIUM 9.2 05/12/2018 1345   ALKPHOS 108 05/12/2018 1345   AST 21 05/12/2018 1345   ALT 11 05/12/2018 1345   BILITOT 0.4 05/12/2018 1345      Impression and Plan: Kristina Ramos is a very pleasant 77 yo caucasian female with iron deficiency anemia. She has responded nicely to the IV iron and is feeling much better.  We will see what today's iron studies show and bring her back in for infusion if needed.  We will go ahead and plan to see her back in another 3 months for follow-up.  She will contact our office with any questions or concerns. We can certainly see her sooner if need be.   Laverna Peace, NP 7/24/20191:45 PM

## 2018-07-15 LAB — FERRITIN: Ferritin: 501 ng/mL — ABNORMAL HIGH (ref 11–307)

## 2018-07-15 LAB — IRON AND TIBC
Iron: 25 ug/dL — ABNORMAL LOW (ref 41–142)
SATURATION RATIOS: 11 % — AB (ref 21–57)
TIBC: 238 ug/dL (ref 236–444)
UIBC: 212 ug/dL

## 2018-07-22 ENCOUNTER — Inpatient Hospital Stay: Payer: Medicare Other | Attending: Hematology & Oncology

## 2018-07-22 VITALS — BP 124/66 | HR 100 | Temp 98.2°F | Resp 17

## 2018-07-22 DIAGNOSIS — D509 Iron deficiency anemia, unspecified: Secondary | ICD-10-CM | POA: Diagnosis not present

## 2018-07-22 DIAGNOSIS — Z79899 Other long term (current) drug therapy: Secondary | ICD-10-CM | POA: Insufficient documentation

## 2018-07-22 DIAGNOSIS — D508 Other iron deficiency anemias: Secondary | ICD-10-CM

## 2018-07-22 MED ORDER — SODIUM CHLORIDE 0.9 % IV SOLN
510.0000 mg | Freq: Once | INTRAVENOUS | Status: AC
  Administered 2018-07-22: 510 mg via INTRAVENOUS
  Filled 2018-07-22: qty 17

## 2018-07-22 MED ORDER — SODIUM CHLORIDE 0.9 % IV SOLN
Freq: Once | INTRAVENOUS | Status: AC
  Administered 2018-07-22: 10:00:00 via INTRAVENOUS
  Filled 2018-07-22: qty 250

## 2018-07-22 NOTE — Patient Instructions (Signed)

## 2018-07-23 ENCOUNTER — Other Ambulatory Visit: Payer: Self-pay

## 2018-07-29 ENCOUNTER — Inpatient Hospital Stay: Payer: Medicare Other

## 2018-07-29 VITALS — BP 132/69 | HR 93 | Temp 97.9°F | Resp 18

## 2018-07-29 DIAGNOSIS — Z79899 Other long term (current) drug therapy: Secondary | ICD-10-CM | POA: Diagnosis not present

## 2018-07-29 DIAGNOSIS — D509 Iron deficiency anemia, unspecified: Secondary | ICD-10-CM | POA: Diagnosis not present

## 2018-07-29 DIAGNOSIS — D508 Other iron deficiency anemias: Secondary | ICD-10-CM

## 2018-07-29 MED ORDER — FERUMOXYTOL INJECTION 510 MG/17 ML
510.0000 mg | Freq: Once | INTRAVENOUS | Status: AC
  Administered 2018-07-29: 510 mg via INTRAVENOUS
  Filled 2018-07-29: qty 17

## 2018-07-29 NOTE — Patient Instructions (Signed)

## 2018-08-05 ENCOUNTER — Ambulatory Visit: Payer: Medicare Other

## 2018-08-09 NOTE — Progress Notes (Signed)
Subjective:   Kristina Ramos is a 77 y.o. female who presents for Medicare Annual (Subsequent) preventive examination.  Review of Systems: No ROS.  Medicare Wellness Visit. Additional risk factors are reflected in the social history. Cardiac Risk Factors include: advanced age (>73men, >40 women);dyslipidemia;hypertension Sleep patterns: Sleeps 6 hrs. Home Safety/Smoke Alarms: Feels safe in home. Smoke alarms in place.  Living environment: Lives alone. Walk in shower.   Female:        Mammo-utd       Dexa scan- order    CCS-utd      Objective:     Vitals: BP 124/71 (BP Location: Right Arm, Patient Position: Sitting, Cuff Size: Normal)   Pulse 90   Ht 5' 3.5" (1.613 m)   Wt 179 lb 6.4 oz (81.4 kg)   SpO2 98%   BMI 31.28 kg/m   Body mass index is 31.28 kg/m.  Advanced Directives 08/10/2018 07/14/2018 06/01/2018 05/12/2018 03/26/2018 02/17/2018 03/17/2017  Does Patient Have a Medical Advance Directive? Yes Yes Yes Yes Yes No Yes  Type of Paramedic of [place];Living will - Living will - Living will - [place];Living will  Does patient want to make changes to medical advance directive? No - Patient declined - - No - Patient declined - - -  Copy of [place] in Chart? No - copy requested - - - - - No - copy requested    Tobacco Social History   Tobacco Use  Smoking Status Never Smoker  Smokeless Tobacco Never Used     Counseling given: Not Answered   Clinical Intake: Pain : No/denies pain     Past Medical History:  Diagnosis Date  . Anemia   . Arthritis   . Depression   . GERD (gastroesophageal reflux disease)   . Heart murmur   . Hx of adenomatous colonic polyps 07/28/2017  . Hyperlipidemia   . Hypertension   . Thyroid disease    Past Surgical History:  Procedure Laterality Date  . TONSILLECTOMY    . TOTAL KNEE ARTHROPLASTY Left 04/25/2014  . TUBAL LIGATION     Family History  Problem Relation  Age of Onset  . Diabetes Maternal Grandmother   . Diabetes Paternal Aunt   . Lung cancer Father        smoker  . Heart disease Father   . CAD Mother   . Cataracts Mother   . Thyroid disease Maternal Aunt   . Colon cancer Neg Hx   . Esophageal cancer Neg Hx   . Pancreatic cancer Neg Hx   . Rectal cancer Neg Hx   . Stomach cancer Neg Hx    Social History   Socioeconomic History  . Marital status: Widowed    Spouse name: Not on file  . Number of children: 2  . Years of education: Not on file  . Highest education level: Not on file  Occupational History  . Occupation: retired  Scientific laboratory technician  . Financial resource strain: Not on file  . Food insecurity:    Worry: Not on file    Inability: Not on file  . Transportation needs:    Medical: Not on file    Non-medical: Not on file  Tobacco Use  . Smoking status: Never Smoker  . Smokeless tobacco: Never Used  Substance and Sexual Activity  . Alcohol use: No  . Drug use: No  . Sexual activity: Not on file  Lifestyle  . Physical  activity:    Days per week: Not on file    Minutes per session: Not on file  . Stress: Not on file  Relationships  . Social connections:    Talks on phone: Not on file    Gets together: Not on file    Attends religious service: Not on file    Active member of club or organization: Not on file    Attends meetings of clubs or organizations: Not on file    Relationship status: Not on file  Other Topics Concern  . Not on file  Social History Narrative   Retired widowed 1 son one daughter   4 caffeinated beverages daily   07/01/2017    Outpatient Encounter Medications as of 08/10/2018  Medication Sig  . amoxicillin (AMOXIL) 500 MG capsule Take 2,000 mg by mouth as needed. only for dental procedeure  . Cholecalciferol (VITAMIN D) 2000 units CAPS Take 1 capsule by mouth daily.  Marland Kitchen levothyroxine (SYNTHROID, LEVOTHROID) 75 MCG tablet Take 1 tablet (75 mcg total) by mouth daily.  Marland Kitchen  lisinopril-hydrochlorothiazide (PRINZIDE,ZESTORETIC) 10-12.5 MG tablet Take 1 tablet by mouth daily.   No facility-administered encounter medications on file as of 08/10/2018.     Activities of Daily Living In your present state of health, do you have any difficulty performing the following activities: 08/10/2018  Hearing? N  Vision? N  Difficulty concentrating or making decisions? N  Walking or climbing stairs? N  Dressing or bathing? N  Doing errands, shopping? N  Preparing Food and eating ? N  Using the Toilet? N  In the past six months, have you accidently leaked urine? N  Do you have problems with loss of bowel control? N  Managing your Medications? N  Managing your Finances? N  Housekeeping or managing your Housekeeping? N  Some recent data might be hidden    Patient Care Team: Carollee Herter, Alferd Apa, DO as PCP - General Marin Olp Rudell Cobb, MD as Consulting Physician (Oncology) Gatha Mayer, MD as Consulting Physician (Gastroenterology)    Assessment:   This is a routine wellness examination for Kristina Ramos. Physical assessment deferred to PCP.  Exercise Activities and Dietary recommendations Current Exercise Habits: The patient does not participate in regular exercise at present, Exercise limited by: None identified Diet (meal preparation, eat out, water intake, caffeinated beverages, dairy products, fruits and vegetables): well balanced, on average, 3 meals per day    Goals    . Have normal iron level and more energy.    . Increase physical activity       Fall Risk Fall Risk  08/10/2018 03/17/2017 08/22/2016 05/09/2015 12/27/2013  Falls in the past year? No Yes Yes No No  Comment - - Emmi Telephone Survey: data to providers prior to load - -  Number falls in past yr: - 1 1 - -  Comment - - Emmi Telephone Survey Actual Response = 1 - -  Injury with Fall? - No Yes - -    Depression Screen PHQ 2/9 Scores 08/10/2018 03/17/2017 05/09/2015 12/27/2013  PHQ - 2 Score 0 0 0 1      Cognitive Function Ad8 score reviewed for issues:  Issues making decisions:no  Less interest in hobbies / activities:no  Repeats questions, stories (family complaining):no  Trouble using ordinary gadgets (microwave, computer, phone):no  Forgets the month or year: no  Mismanaging finances: no  Remembering appts:no  Daily problems with thinking and/or memory:no Ad8 score is=0        Immunization  History  Administered Date(s) Administered  . Pneumococcal Conjugate-13 03/17/2017  . Pneumococcal Polysaccharide-23 02/27/2010  . Td 03/23/2008    Screening Tests Health Maintenance  Topic Date Due  . TETANUS/TDAP  03/23/2018  . INFLUENZA VACCINE  07/22/2018  . MAMMOGRAM  04/14/2019  . DEXA SCAN  Completed  . PNA vac Low Risk Adult  Completed      Plan:    Please schedule your next medicare wellness visit with me in 1 yr.  Continue to eat heart healthy diet (full of fruits, vegetables, whole grains, lean protein, water--limit salt, fat, and sugar intake) and increase physical activity as tolerated.  Continue doing brain stimulating activities (puzzles, reading, adult coloring books, staying active) to keep memory sharp.   Bring a copy of your living will and/or healthcare power of attorney to your next office visit.  I have scheduled your bone density scan. Please schedule.   I have personally reviewed and noted the following in the patient's chart:   . Medical and social history . Use of alcohol, tobacco or illicit drugs  . Current medications and supplements . Functional ability and status . Nutritional status . Physical activity . Advanced directives . List of other physicians . Hospitalizations, surgeries, and ER visits in previous 12 months . Vitals . Screenings to include cognitive, depression, and falls . Referrals and appointments  In addition, I have reviewed and discussed with patient certain preventive protocols, quality metrics, and best  practice recommendations. A written personalized care plan for preventive services as well as general preventive health recommendations were provided to patient.     Shela Nevin, [place]  08/10/2018

## 2018-08-10 ENCOUNTER — Ambulatory Visit (INDEPENDENT_AMBULATORY_CARE_PROVIDER_SITE_OTHER): Payer: Medicare Other

## 2018-08-10 ENCOUNTER — Ambulatory Visit (INDEPENDENT_AMBULATORY_CARE_PROVIDER_SITE_OTHER): Payer: Medicare Other | Admitting: *Deleted

## 2018-08-10 ENCOUNTER — Encounter: Payer: Self-pay | Admitting: *Deleted

## 2018-08-10 VITALS — BP 124/71 | HR 90 | Ht 63.5 in | Wt 179.4 lb

## 2018-08-10 DIAGNOSIS — Z0000 Encounter for general adult medical examination without abnormal findings: Secondary | ICD-10-CM | POA: Diagnosis not present

## 2018-08-10 DIAGNOSIS — E538 Deficiency of other specified B group vitamins: Secondary | ICD-10-CM

## 2018-08-10 DIAGNOSIS — Z780 Asymptomatic menopausal state: Secondary | ICD-10-CM

## 2018-08-10 MED ORDER — CYANOCOBALAMIN 1000 MCG/ML IJ SOLN
1000.0000 ug | Freq: Once | INTRAMUSCULAR | Status: AC
  Administered 2018-08-10: 1000 ug via INTRAMUSCULAR

## 2018-08-10 NOTE — Progress Notes (Signed)
Pre visit review using our clinic tool,if applicable. No additional management support is needed unless otherwise documented below in the visit note.   Pt here for monthly B12 injection per order from Dr. Fermin Schwab.  B12 1058mcg given IM righr deltoid., and pt tolerated injection well.  No complaints voiced this visit.  Next B12 injection scheduled for September 10, 2018. Patient aware.  Marland Kitchen

## 2018-08-10 NOTE — Patient Instructions (Signed)
Please schedule your next medicare wellness visit with me in 1 yr.  Continue to eat heart healthy diet (full of fruits, vegetables, whole grains, lean protein, water--limit salt, fat, and sugar intake) and increase physical activity as tolerated.  Continue doing brain stimulating activities (puzzles, reading, adult coloring books, staying active) to keep memory sharp.   Bring a copy of your living will and/or healthcare power of attorney to your next office visit.  I have scheduled your bone density scan. Please schedule.   Kristina Ramos , Thank you for taking time to come for your Medicare Wellness Visit. I appreciate your ongoing commitment to your health goals. Please review the following plan we discussed and let me know if I can assist you in the future.   These are the goals we discussed: Goals    . Have normal iron level and more energy.    . Increase physical activity       This is a list of the screening recommended for you and due dates:  Health Maintenance  Topic Date Due  . Tetanus Vaccine  03/23/2018  . Flu Shot  07/22/2018  . Mammogram  04/14/2019  . DEXA scan (bone density measurement)  Completed  . Pneumonia vaccines  Completed    Health Maintenance for Postmenopausal Women Menopause is a normal process in which your reproductive ability comes to an end. This process happens gradually over a span of months to years, usually between the ages of 24 and 42. Menopause is complete when you have missed 12 consecutive menstrual periods. It is important to talk with your health care provider about some of the most common conditions that affect postmenopausal women, such as heart disease, cancer, and bone loss (osteoporosis). Adopting a healthy lifestyle and getting preventive care can help to promote your health and wellness. Those actions can also lower your chances of developing some of these common conditions. What should I know about menopause? During menopause, you may  experience a number of symptoms, such as:  Moderate-to-severe hot flashes.  Night sweats.  Decrease in sex drive.  Mood swings.  Headaches.  Tiredness.  Irritability.  Memory problems.  Insomnia.  Choosing to treat or not to treat menopausal changes is an individual decision that you make with your health care provider. What should I know about hormone replacement therapy and supplements? Hormone therapy products are effective for treating symptoms that are associated with menopause, such as hot flashes and night sweats. Hormone replacement carries certain risks, especially as you become older. If you are thinking about using estrogen or estrogen with progestin treatments, discuss the benefits and risks with your health care provider. What should I know about heart disease and stroke? Heart disease, heart attack, and stroke become more likely as you age. This may be due, in part, to the hormonal changes that your body experiences during menopause. These can affect how your body processes dietary fats, triglycerides, and cholesterol. Heart attack and stroke are both medical emergencies. There are many things that you can do to help prevent heart disease and stroke:  Have your blood pressure checked at least every 1-2 years. High blood pressure causes heart disease and increases the risk of stroke.  If you are 34-23 years old, ask your health care provider if you should take aspirin to prevent a heart attack or a stroke.  Do not use any tobacco products, including cigarettes, chewing tobacco, or electronic cigarettes. If you need help quitting, ask your health care provider.  It is important to eat a healthy diet and maintain a healthy weight. ? Be sure to include plenty of vegetables, fruits, low-fat dairy products, and lean protein. ? Avoid eating foods that are high in solid fats, added sugars, or salt (sodium).  Get regular exercise. This is one of the most important things  that you can do for your health. ? Try to exercise for at least 150 minutes each week. The type of exercise that you do should increase your heart rate and make you sweat. This is known as moderate-intensity exercise. ? Try to do strengthening exercises at least twice each week. Do these in addition to the moderate-intensity exercise.  Know your numbers.Ask your health care provider to check your cholesterol and your blood glucose. Continue to have your blood tested as directed by your health care provider.  What should I know about cancer screening? There are several types of cancer. Take the following steps to reduce your risk and to catch any cancer development as early as possible. Breast Cancer  Practice breast self-awareness. ? This means understanding how your breasts normally appear and feel. ? It also means doing regular breast self-exams. Let your health care provider know about any changes, no matter how small.  If you are 31 or older, have a clinician do a breast exam (clinical breast exam or CBE) every year. Depending on your age, family history, and medical history, it may be recommended that you also have a yearly breast X-ray (mammogram).  If you have a family history of breast cancer, talk with your health care provider about genetic screening.  If you are at high risk for breast cancer, talk with your health care provider about having an MRI and a mammogram every year.  Breast cancer (BRCA) gene test is recommended for women who have family members with BRCA-related cancers. Results of the assessment will determine the need for genetic counseling and BRCA1 and for BRCA2 testing. BRCA-related cancers include these types: ? Breast. This occurs in males or females. ? Ovarian. ? Tubal. This may also be called fallopian tube cancer. ? Cancer of the abdominal or pelvic lining (peritoneal cancer). ? Prostate. ? Pancreatic.  Cervical, Uterine, and Ovarian Cancer Your health  care provider may recommend that you be screened regularly for cancer of the pelvic organs. These include your ovaries, uterus, and vagina. This screening involves a pelvic exam, which includes checking for microscopic changes to the surface of your cervix (Pap test).  For women ages 21-65, health care providers may recommend a pelvic exam and a Pap test every three years. For women ages 32-65, they may recommend the Pap test and pelvic exam, combined with testing for human papilloma virus (HPV), every five years. Some types of HPV increase your risk of cervical cancer. Testing for HPV may also be done on women of any age who have unclear Pap test results.  Other health care providers may not recommend any screening for nonpregnant women who are considered low risk for pelvic cancer and have no symptoms. Ask your health care provider if a screening pelvic exam is right for you.  If you have had past treatment for cervical cancer or a condition that could lead to cancer, you need Pap tests and screening for cancer for at least 20 years after your treatment. If Pap tests have been discontinued for you, your risk factors (such as having a new sexual partner) need to be reassessed to determine if you should start having screenings  again. Some women have medical problems that increase the chance of getting cervical cancer. In these cases, your health care provider may recommend that you have screening and Pap tests more often.  If you have a family history of uterine cancer or ovarian cancer, talk with your health care provider about genetic screening.  If you have vaginal bleeding after reaching menopause, tell your health care provider.  There are currently no reliable tests available to screen for ovarian cancer.  Lung Cancer Lung cancer screening is recommended for adults 75-45 years old who are at high risk for lung cancer because of a history of smoking. A yearly low-dose CT scan of the lungs is  recommended if you:  Currently smoke.  Have a history of at least 30 pack-years of smoking and you currently smoke or have quit within the past 15 years. A pack-year is smoking an average of one pack of cigarettes per day for one year.  Yearly screening should:  Continue until it has been 15 years since you quit.  Stop if you develop a health problem that would prevent you from having lung cancer treatment.  Colorectal Cancer  This type of cancer can be detected and can often be prevented.  Routine colorectal cancer screening usually begins at age 63 and continues through age 50.  If you have risk factors for colon cancer, your health care provider may recommend that you be screened at an earlier age.  If you have a family history of colorectal cancer, talk with your health care provider about genetic screening.  Your health care provider may also recommend using home test kits to check for hidden blood in your stool.  A small camera at the end of a tube can be used to examine your colon directly (sigmoidoscopy or colonoscopy). This is done to check for the earliest forms of colorectal cancer.  Direct examination of the colon should be repeated every 5-10 years until age 75. However, if early forms of precancerous polyps or small growths are found or if you have a family history or genetic risk for colorectal cancer, you may need to be screened more often.  Skin Cancer  Check your skin from head to toe regularly.  Monitor any moles. Be sure to tell your health care provider: ? About any new moles or changes in moles, especially if there is a change in a mole's shape or color. ? If you have a mole that is larger than the size of a pencil eraser.  If any of your family members has a history of skin cancer, especially at a young age, talk with your health care provider about genetic screening.  Always use sunscreen. Apply sunscreen liberally and repeatedly throughout the  day.  Whenever you are outside, protect yourself by wearing long sleeves, pants, a wide-brimmed hat, and sunglasses.  What should I know about osteoporosis? Osteoporosis is a condition in which bone destruction happens more quickly than new bone creation. After menopause, you may be at an increased risk for osteoporosis. To help prevent osteoporosis or the bone fractures that can happen because of osteoporosis, the following is recommended:  If you are 104-80 years old, get at least 1,000 mg of calcium and at least 600 mg of vitamin D per day.  If you are older than age 86 but younger than age 51, get at least 1,200 mg of calcium and at least 600 mg of vitamin D per day.  If you are older than age 64,  get at least 1,200 mg of calcium and at least 800 mg of vitamin D per day.  Smoking and excessive alcohol intake increase the risk of osteoporosis. Eat foods that are rich in calcium and vitamin D, and do weight-bearing exercises several times each week as directed by your health care provider. What should I know about how menopause affects my mental health? Depression may occur at any age, but it is more common as you become older. Common symptoms of depression include:  Low or sad mood.  Changes in sleep patterns.  Changes in appetite or eating patterns.  Feeling an overall lack of motivation or enjoyment of activities that you previously enjoyed.  Frequent crying spells.  Talk with your health care provider if you think that you are experiencing depression. What should I know about immunizations? It is important that you get and maintain your immunizations. These include:  Tetanus, diphtheria, and pertussis (Tdap) booster vaccine.  Influenza every year before the flu season begins.  Pneumonia vaccine.  Shingles vaccine.  Your health care provider may also recommend other immunizations. This information is not intended to replace advice given to you by your health care provider.  Make sure you discuss any questions you have with your health care provider. Document Released: 01/30/2006 Document Revised: 06/27/2016 Document Reviewed: 09/11/2015 Elsevier Interactive Patient Education  2018 [employer].

## 2018-08-10 NOTE — Progress Notes (Signed)
Reviewed  Yvonne R Lowne Chase, DO  

## 2018-08-11 ENCOUNTER — Ambulatory Visit (HOSPITAL_BASED_OUTPATIENT_CLINIC_OR_DEPARTMENT_OTHER)
Admission: RE | Admit: 2018-08-11 | Discharge: 2018-08-11 | Disposition: A | Discharge status: Home / Self Care | Payer: Medicare Other | Source: Ambulatory Visit | Attending: Family Medicine | Admitting: Family Medicine

## 2018-08-11 DIAGNOSIS — M8588 Other specified disorders of bone density and structure, other site: Secondary | ICD-10-CM | POA: Diagnosis not present

## 2018-08-11 DIAGNOSIS — Z780 Asymptomatic menopausal state: Secondary | ICD-10-CM | POA: Diagnosis not present

## 2018-08-11 DIAGNOSIS — M8589 Other specified disorders of bone density and structure, multiple sites: Secondary | ICD-10-CM | POA: Diagnosis not present

## 2018-09-09 ENCOUNTER — Encounter: Payer: Self-pay | Admitting: Family

## 2018-09-09 ENCOUNTER — Inpatient Hospital Stay (HOSPITAL_BASED_OUTPATIENT_CLINIC_OR_DEPARTMENT_OTHER): Payer: Medicare Other | Admitting: Family

## 2018-09-09 ENCOUNTER — Inpatient Hospital Stay: Payer: Medicare Other | Attending: Hematology & Oncology

## 2018-09-09 ENCOUNTER — Other Ambulatory Visit: Payer: Self-pay

## 2018-09-09 VITALS — BP 124/63 | HR 87 | Temp 97.4°F | Resp 18 | Wt 171.0 lb

## 2018-09-09 DIAGNOSIS — D649 Anemia, unspecified: Secondary | ICD-10-CM

## 2018-09-09 DIAGNOSIS — R5383 Other fatigue: Secondary | ICD-10-CM | POA: Diagnosis not present

## 2018-09-09 DIAGNOSIS — R634 Abnormal weight loss: Secondary | ICD-10-CM | POA: Insufficient documentation

## 2018-09-09 DIAGNOSIS — E559 Vitamin D deficiency, unspecified: Secondary | ICD-10-CM

## 2018-09-09 DIAGNOSIS — R161 Splenomegaly, not elsewhere classified: Secondary | ICD-10-CM | POA: Insufficient documentation

## 2018-09-09 DIAGNOSIS — D509 Iron deficiency anemia, unspecified: Secondary | ICD-10-CM | POA: Insufficient documentation

## 2018-09-09 DIAGNOSIS — D508 Other iron deficiency anemias: Secondary | ICD-10-CM

## 2018-09-09 DIAGNOSIS — R531 Weakness: Secondary | ICD-10-CM

## 2018-09-09 DIAGNOSIS — D510 Vitamin B12 deficiency anemia due to intrinsic factor deficiency: Secondary | ICD-10-CM

## 2018-09-09 DIAGNOSIS — R61 Generalized hyperhidrosis: Secondary | ICD-10-CM | POA: Diagnosis not present

## 2018-09-09 LAB — CMP (CANCER CENTER ONLY)
ALT: 8 U/L (ref 0–44)
ANION GAP: 9 (ref 5–15)
AST: 21 U/L (ref 15–41)
Albumin: 2.7 g/dL — ABNORMAL LOW (ref 3.5–5.0)
Alkaline Phosphatase: 164 U/L — ABNORMAL HIGH (ref 38–126)
BILIRUBIN TOTAL: 0.6 mg/dL (ref 0.3–1.2)
BUN: 10 mg/dL (ref 8–23)
CHLORIDE: 95 mmol/L — AB (ref 98–111)
CO2: 27 mmol/L (ref 22–32)
Calcium: 9.7 mg/dL (ref 8.9–10.3)
Creatinine: 0.79 mg/dL (ref 0.44–1.00)
GFR, Est AFR Am: >60 mL/min (ref >60)
Glucose, Bld: 93 mg/dL (ref 70–99)
POTASSIUM: 4.9 mmol/L (ref 3.5–5.1)
Sodium: 131 mmol/L — ABNORMAL LOW (ref 135–145)
TOTAL PROTEIN: 9.1 g/dL — AB (ref 6.5–8.1)

## 2018-09-09 LAB — CBC WITH DIFFERENTIAL (CANCER CENTER ONLY)
BASOS ABS: 0.1 10*3/uL (ref 0.0–0.1)
Basophils Relative: 1 %
Eosinophils Absolute: 0.1 10*3/uL (ref 0.0–0.5)
Eosinophils Relative: 1 %
HEMATOCRIT: 32.3 % — AB (ref 34.8–46.6)
HEMOGLOBIN: 10.8 g/dL — AB (ref 11.6–15.9)
LYMPHS PCT: 44 %
Lymphs Abs: 3.5 10*3/uL — ABNORMAL HIGH (ref 0.9–3.3)
MCH: 29.6 pg (ref 26.0–34.0)
MCHC: 33.4 g/dL (ref 32.0–36.0)
MCV: 88.5 fL (ref 81.0–101.0)
MONO ABS: 1.1 10*3/uL — AB (ref 0.1–0.9)
Monocytes Relative: 14 %
NEUTROS ABS: 3.1 10*3/uL (ref 1.5–6.5)
NEUTROS PCT: 40 %
Platelet Count: 178 10*3/uL (ref 145–400)
RBC: 3.65 MIL/uL — AB (ref 3.70–5.32)
RDW: 15.9 % — ABNORMAL HIGH (ref 11.1–15.7)
WBC: 7.7 10*3/uL (ref 3.9–10.0)

## 2018-09-09 LAB — RETICULOCYTES
RBC.: 3.61 MIL/uL — AB (ref 3.87–5.11)
RETIC COUNT ABSOLUTE: 101.1 10*3/uL (ref 19.0–186.0)
Retic Ct Pct: 2.8 % (ref 0.4–3.1)

## 2018-09-09 LAB — IRON AND TIBC
Iron: 34 ug/dL — ABNORMAL LOW (ref 41–142)
SATURATION RATIOS: 16 % — AB (ref 21–57)
TIBC: 206 ug/dL — ABNORMAL LOW (ref 236–444)
UIBC: 173 ug/dL

## 2018-09-09 LAB — VITAMIN B12: VITAMIN B 12: 417 pg/mL (ref 180–914)

## 2018-09-09 LAB — FERRITIN: FERRITIN: 698 ng/mL — AB (ref 11–307)

## 2018-09-09 NOTE — Progress Notes (Signed)
Hematology and Oncology Follow Up Visit  Adrianna Dudas [id number] [birth year] 77 y.o. 09/09/2018   Principle Diagnosis:  Iron deficiency anemia  Current Therapy:   IV iron as indicated - last received in August 2019 x 2   Interim History: Ms. Eckroth is here today for follow-up. She is feeling quite fatigued and weak. She is now having night sweats at least twice a night and has had an unexplained 34 lb weight loss over the last 3 months.  No lymphadenopathy noted on exam.  Hgb is stable at 10.8, MCV 88, WBC 7.7 and platelet count 178.  She denies having had any episodes of bleeding. No bruising or petechiae.  No fever, chills, n/v, cough, rash, dizziness, SOB, chest pain, palpitations, abdominal pain or changes in bowel or bladder habits.  No swelling, tenderness, numbness or tingling in his extremities. No c/o pain.  She states that she has maintained a good appetite (3 males a day) and is staying well hydrated.   ECOG Performance Status: 1 - Symptomatic but completely ambulatory  Medications:  Allergies as of 09/09/2018      Reactions   Aspirin    REACTION: pt states that it irritates her stomach, coated ASA is ok      Medication List        Accurate as of 09/09/18  9:44 AM. Always use your most recent med list.          amoxicillin 500 MG capsule Commonly known as:  AMOXIL Take 2,000 mg by mouth as needed. only for dental procedeure   levothyroxine 75 MCG tablet Commonly known as:  SYNTHROID, LEVOTHROID Take 1 tablet (75 mcg total) by mouth daily.   lisinopril-hydrochlorothiazide 10-12.5 MG tablet Commonly known as:  PRINZIDE,ZESTORETIC Take 1 tablet by mouth daily.   Vitamin D 2000 units Caps Take 1 capsule by mouth daily.       Allergies:  Allergies  Allergen Reactions  . Aspirin     REACTION: pt states that it irritates her stomach, coated ASA is ok    Past Medical History, Surgical history, Social history, and Family History were reviewed and  updated.  Review of Systems: All other 10 point review of systems is negative.   Physical Exam:  vitals were not taken for this visit.   Wt Readings from Last 3 Encounters:  08/10/18 179 lb 6.4 oz (81.4 kg)  07/14/18 181 lb 8 oz (82.3 kg)  05/12/18 195 lb (88.5 kg)    Ocular: Sclerae unicteric, pupils equal, round and reactive to light Ear-nose-throat: Oropharynx clear, dentition fair Lymphatic: No cervical, supraclavicular or axillary adenopathy Lungs no rales or rhonchi, good excursion bilaterally Heart regular rate and rhythm, no murmur appreciated Abd soft, nontender, positive bowel sounds, no liver or spleen tip palpated on exam, no fluid wave  MSK no focal spinal tenderness, no joint edema Neuro: non-focal, well-oriented, appropriate affect Breasts: Deferred   Lab Results  Component Value Date   WBC 7.7 09/09/2018   HGB 10.8 (L) 09/09/2018   HCT 32.3 (L) 09/09/2018   MCV 88.5 09/09/2018   PLT 178 09/09/2018   Lab Results  Component Value Date   FERRITIN 501 (H) 07/14/2018   IRON 25 (L) 07/14/2018   TIBC 238 07/14/2018   UIBC 212 07/14/2018   IRONPCTSAT 11 (L) 07/14/2018   Lab Results  Component Value Date   RETICCTPCT 3.6 (H) 07/14/2018   RBC 3.65 (L) 09/09/2018   RETICCTABS 105,600 (H) 03/03/2018   No results found for:  KPAFRELGTCHN, LAMBDASER, KAPLAMBRATIO No results found for: IGGSERUM, IGA, IGMSERUM No results found for: TOTALPROTELP, ALBUMINELP, A1GS, A2GS, Violet Baldy, MSPIKE, SPEI   Chemistry      Component Value Date/Time   NA 129 (L) 05/12/2018 1345   K 4.4 05/12/2018 1345   CL 94 (L) 05/12/2018 1345   CO2 24 05/12/2018 1345   BUN 9 05/12/2018 1345   CREATININE 0.74 05/12/2018 1345      Component Value Date/Time   CALCIUM 9.2 05/12/2018 1345   ALKPHOS 108 05/12/2018 1345   AST 21 05/12/2018 1345   ALT 11 05/12/2018 1345   BILITOT 0.4 05/12/2018 1345      Impression and Plan: Ms. Holsworth is a very pleasant 77 yo caucasian female  with iron deficiency anemia. She is now presenting with fatigue, weakness, night sweats and significant unexplained weight loss.  We will schedule her for CT of chest, abdomen and pelvis to further evaluate for cause.  We will see what her iron studies show and bring her back in for infusion if needed.  We will go ahead and plan to see her back in 1 month.  She will contact our office with any questions or concerns. We can certainly see her sooner if need be.   Laverna Peace, NP 9/19/20199:44 AM

## 2018-09-10 ENCOUNTER — Ambulatory Visit (INDEPENDENT_AMBULATORY_CARE_PROVIDER_SITE_OTHER): Payer: Medicare Other

## 2018-09-10 DIAGNOSIS — E538 Deficiency of other specified B group vitamins: Secondary | ICD-10-CM | POA: Diagnosis not present

## 2018-09-10 MED ORDER — CYANOCOBALAMIN 1000 MCG/ML IJ SOLN
1000.0000 ug | Freq: Once | INTRAMUSCULAR | Status: AC
  Administered 2018-09-10: 1000 ug via INTRAMUSCULAR

## 2018-09-10 MED ORDER — CYANOCOBALAMIN 1000 MCG/ML IJ SOLN: 1000.0000 ug | Freq: Once | INTRAMUSCULAR | Status: DC

## 2018-09-10 NOTE — Progress Notes (Signed)
Noted  Darrel Baroni R Lowne Chase, DO  

## 2018-09-10 NOTE — Progress Notes (Signed)
Pre visit review using our clinic tool,if applicable. No additional management support is needed unless otherwise documented below in the visit note.   Pt here for monthly B12 injection per order from Dr. Roma Schanz.  B12 1059mcg given IM left deltoid. ,  Patient  tolerated injection well. No complaints voiced this visit.  Next B12 injection scheduled for 10/06/18.

## 2018-09-15 ENCOUNTER — Ambulatory Visit (HOSPITAL_BASED_OUTPATIENT_CLINIC_OR_DEPARTMENT_OTHER)
Admission: RE | Admit: 2018-09-15 | Discharge: 2018-09-15 | Disposition: A | Discharge status: Home / Self Care | Payer: Medicare Other | Source: Ambulatory Visit | Attending: Family | Admitting: Family

## 2018-09-15 ENCOUNTER — Encounter (HOSPITAL_BASED_OUTPATIENT_CLINIC_OR_DEPARTMENT_OTHER): Payer: Self-pay

## 2018-09-15 DIAGNOSIS — R5383 Other fatigue: Secondary | ICD-10-CM

## 2018-09-15 DIAGNOSIS — I700 Atherosclerosis of aorta: Secondary | ICD-10-CM | POA: Diagnosis not present

## 2018-09-15 DIAGNOSIS — N289 Disorder of kidney and ureter, unspecified: Secondary | ICD-10-CM | POA: Diagnosis not present

## 2018-09-15 DIAGNOSIS — R61 Generalized hyperhidrosis: Secondary | ICD-10-CM

## 2018-09-15 DIAGNOSIS — R634 Abnormal weight loss: Secondary | ICD-10-CM | POA: Diagnosis not present

## 2018-09-15 DIAGNOSIS — N6320 Unspecified lump in the left breast, unspecified quadrant: Secondary | ICD-10-CM | POA: Diagnosis not present

## 2018-09-15 DIAGNOSIS — I2510 Atherosclerotic heart disease of native coronary artery without angina pectoris: Secondary | ICD-10-CM | POA: Diagnosis not present

## 2018-09-15 DIAGNOSIS — E041 Nontoxic single thyroid nodule: Secondary | ICD-10-CM | POA: Insufficient documentation

## 2018-09-15 DIAGNOSIS — R162 Hepatomegaly with splenomegaly, not elsewhere classified: Secondary | ICD-10-CM | POA: Diagnosis not present

## 2018-09-15 DIAGNOSIS — R109 Unspecified abdominal pain: Secondary | ICD-10-CM | POA: Diagnosis not present

## 2018-09-15 DIAGNOSIS — D1771 Benign lipomatous neoplasm of kidney: Secondary | ICD-10-CM | POA: Insufficient documentation

## 2018-09-15 MED ORDER — IOPAMIDOL (ISOVUE-300) INJECTION 61%
100.0000 mL | Freq: Once | INTRAVENOUS | Status: AC | PRN
  Administered 2018-09-15: 100 mL via INTRAVENOUS

## 2018-09-16 ENCOUNTER — Encounter: Payer: Self-pay | Admitting: Hematology & Oncology

## 2018-09-16 ENCOUNTER — Inpatient Hospital Stay (HOSPITAL_BASED_OUTPATIENT_CLINIC_OR_DEPARTMENT_OTHER): Payer: Medicare Other | Admitting: Hematology & Oncology

## 2018-09-16 ENCOUNTER — Other Ambulatory Visit: Payer: Self-pay

## 2018-09-16 VITALS — BP 131/58 | HR 104 | Temp 98.1°F | Resp 16 | Wt 169.0 lb

## 2018-09-16 DIAGNOSIS — D509 Iron deficiency anemia, unspecified: Secondary | ICD-10-CM | POA: Diagnosis not present

## 2018-09-16 DIAGNOSIS — R5383 Other fatigue: Secondary | ICD-10-CM

## 2018-09-16 DIAGNOSIS — R61 Generalized hyperhidrosis: Secondary | ICD-10-CM

## 2018-09-16 DIAGNOSIS — R531 Weakness: Secondary | ICD-10-CM

## 2018-09-16 DIAGNOSIS — R161 Splenomegaly, not elsewhere classified: Secondary | ICD-10-CM | POA: Diagnosis not present

## 2018-09-16 DIAGNOSIS — C8300 Small cell B-cell lymphoma, unspecified site: Secondary | ICD-10-CM

## 2018-09-16 DIAGNOSIS — R634 Abnormal weight loss: Secondary | ICD-10-CM

## 2018-09-16 NOTE — Progress Notes (Signed)
Hematology and Oncology Follow Up Visit  Kristina Ramos [id number] Oct 28, 1941 77 y.o. 09/16/2018   Principle Diagnosis:  Iron deficiency anemia Massive splenomegaly/ lymphadenopathy  Current Therapy:   IV iron as indicated - last received in August 2019 x 2   Interim History: Kristina Ramos is here today for follow-up.  Thanks to the incredible clinical skill of my nurse practitioner, we have discovered that Kristina Ramos has a real problem.  The iron deficiency is really not an issue right now.  I think that the real problem is what we find on her CT scan.  The CT scan was done on 09/15/2018.  This showed massive splenomegaly.  Her spleen measured 30 cm.  I must say I cannot remember a spleen at this size before.  She had some hepatomegaly.  There was also some lymph nodes that were enlarged.  She is losing weight.  She is having night sweats.  There is no fever.  She just feels very tired.  She has not noted any lymph nodes.  Graph her last mammogram was just recently.  There is surprisingly, no change in bowel or bladder habits.  She says that she gets full very quickly.  We reviewed the CT scan.  I suspect that she is going to end up having non-Hodgkin's lymphoma.  I would think that this would be a low-grade lymphoma.  This has to have happened over a matter of months.  She has had no problems with leg swelling.  Overall, she has maintained a decent performance status of ECOG 1.   Medications:  Allergies as of 09/16/2018      Reactions   Aspirin    REACTION: pt states that it irritates her stomach, coated ASA is ok      Medication List        Accurate as of 09/16/18  5:12 PM. Always use your most recent med list.          amoxicillin 500 MG capsule Commonly known as:  AMOXIL Take 2,000 mg by mouth as needed. only for dental procedeure   levothyroxine 75 MCG tablet Commonly known as:  SYNTHROID, LEVOTHROID Take 1 tablet (75 mcg total) by mouth daily.     lisinopril-hydrochlorothiazide 10-12.5 MG tablet Commonly known as:  PRINZIDE,ZESTORETIC Take 1 tablet by mouth daily.   Vitamin D 2000 units Caps Take 1 capsule by mouth daily.       Allergies:  Allergies  Allergen Reactions  . Aspirin     REACTION: pt states that it irritates her stomach, coated ASA is ok    Past Medical History, Surgical history, Social history, and Family History were reviewed and updated.  Review of Systems: Review of Systems  Constitutional: Positive for malaise/fatigue.  HENT: Negative.   Eyes: Negative.   Respiratory: Negative.   Cardiovascular: Negative.   Gastrointestinal: Positive for abdominal pain.  Genitourinary: Negative.   Musculoskeletal: Negative.   Skin: Negative.   Neurological: Negative.   Endo/Heme/Allergies: Negative.   Psychiatric/Behavioral: Negative.      Physical Exam:  weight is 169 lb (76.7 kg). Her oral temperature is 98.1 F (36.7 C). Her blood pressure is 131/58 (abnormal) and her pulse is 104 (abnormal). Her respiration is 16 and oxygen saturation is 97%.   Wt Readings from Last 3 Encounters:  09/16/18 169 lb (76.7 kg)  09/09/18 171 lb (77.6 kg)  08/10/18 179 lb 6.4 oz (81.4 kg)    Physical Exam  Constitutional: She is oriented to person, place, and  time.  HENT:  Head: Normocephalic and atraumatic.  Mouth/Throat: Oropharynx is clear and moist.  Eyes: Pupils are equal, round, and reactive to light. EOM are normal.  Neck: Normal range of motion.  Neck exam shows no adenopathy in the cervical or supraclavicular lymph nodes.  Cardiovascular: Normal rate, regular rhythm and normal heart sounds.  Pulmonary/Chest: Effort normal and breath sounds normal.  Abdominal: Soft. Bowel sounds are normal.  Abdominal exam shows massive splenomegaly.  Her spleen is palpable below the umbilicus.  Her liver edge is at the right costal margin.  There is no fluid wave.  Musculoskeletal: Normal range of motion. She exhibits no  edema, tenderness or deformity.  Lymphadenopathy:    She has no cervical adenopathy.  Neurological: She is alert and oriented to person, place, and time.  Skin: Skin is warm and dry. No rash noted. No erythema.  Psychiatric: She has a normal mood and affect. Her behavior is normal. Judgment and thought content normal.  Vitals reviewed.    Lab Results  Component Value Date   WBC 7.7 09/09/2018   HGB 10.8 (L) 09/09/2018   HCT 32.3 (L) 09/09/2018   MCV 88.5 09/09/2018   PLT 178 09/09/2018   Lab Results  Component Value Date   FERRITIN 698 (H) 09/09/2018   IRON 34 (L) 09/09/2018   TIBC 206 (L) 09/09/2018   UIBC 173 09/09/2018   IRONPCTSAT 16 (L) 09/09/2018   Lab Results  Component Value Date   RETICCTPCT 2.8 09/09/2018   RBC 3.65 (L) 09/09/2018   RBC 3.61 (L) 09/09/2018   RETICCTABS 105,600 (H) 03/03/2018   No results found for: KPAFRELGTCHN, LAMBDASER, KAPLAMBRATIO No results found for: IGGSERUM, IGA, IGMSERUM No results found for: TOTALPROTELP, ALBUMINELP, A1GS, A2GS, BETS, BETA2SER, GAMS, MSPIKE, SPEI   Chemistry      Component Value Date/Time   NA 131 (L) 09/09/2018 1032   K 4.9 09/09/2018 1032   CL 95 (L) 09/09/2018 1032   CO2 27 09/09/2018 1032   BUN 10 09/09/2018 1032   CREATININE 0.79 09/09/2018 1032      Component Value Date/Time   CALCIUM 9.7 09/09/2018 1032   ALKPHOS 164 (H) 09/09/2018 1032   AST 21 09/09/2018 1032   ALT 8 09/09/2018 1032   BILITOT 0.6 09/09/2018 1032      Impression and Plan: Kristina Ramos is a very pleasant 77 yo caucasian female with massive splenomegaly.  Again, I have to believe that this is going to be some form of non-Hodgkin's lymphoma.  I would think that this would be a low-grade lymphoma by the fact that she really has not had a lot of symptoms and that this probably has been progressed over a matter of 6 months or more.  I think the best means of trying to make a diagnosis with a bone marrow biopsy.  At least with a bone marrow  biopsy, we can see if there is lymphoma.  The hematopathologist should be able to tell us if this is a high-grade lymphoma or a low-grade lymphoma from the bone marrow biopsy.  There really are no lymph nodes that could be accessed easily.  I would be reluctant to biopsy her spleen although this is a possibility if we are "left in a bind."  I would like to try to get this biopsy done next week.  I am going to hold on any kind of PET scan right now.  As far as additional lab work, I will not order anything until I  know what is going on.  I cannot imagine that this is going to be a myeloproliferative neoplasm.  I looked at her blood smear in the past and have not seen anything that looks suspicious for a myeloproliferative process.  However, this cannot be totally discounted.  I spent about an hour with her.  All the time spent face-to-face.  I reviewed her CT scan with her.  I went over my recommendations and the possibility that we might be looking at.  She understands all this.  All the time was spent face-to-face with her.  We will plan to get her back once we get the bone marrow biopsy results.     Volanda Napoleon, MD 9/26/20195:12 PM

## 2018-09-27 ENCOUNTER — Other Ambulatory Visit: Payer: Self-pay | Admitting: Radiology

## 2018-09-28 ENCOUNTER — Ambulatory Visit (HOSPITAL_COMMUNITY)
Admission: RE | Admit: 2018-09-28 | Discharge: 2018-09-28 | Disposition: A | Discharge status: Home / Self Care | Payer: Medicare Other | Source: Ambulatory Visit | Attending: Hematology & Oncology | Admitting: Hematology & Oncology

## 2018-09-28 ENCOUNTER — Other Ambulatory Visit: Payer: Self-pay

## 2018-09-28 ENCOUNTER — Encounter (HOSPITAL_COMMUNITY): Payer: Self-pay

## 2018-09-28 DIAGNOSIS — R591 Generalized enlarged lymph nodes: Secondary | ICD-10-CM | POA: Insufficient documentation

## 2018-09-28 DIAGNOSIS — Z01818 Encounter for other preprocedural examination: Secondary | ICD-10-CM | POA: Insufficient documentation

## 2018-09-28 DIAGNOSIS — R161 Splenomegaly, not elsewhere classified: Secondary | ICD-10-CM | POA: Diagnosis not present

## 2018-09-28 DIAGNOSIS — D649 Anemia, unspecified: Secondary | ICD-10-CM | POA: Diagnosis not present

## 2018-09-28 DIAGNOSIS — D1771 Benign lipomatous neoplasm of kidney: Secondary | ICD-10-CM | POA: Diagnosis not present

## 2018-09-28 DIAGNOSIS — Z886 Allergy status to analgesic agent status: Secondary | ICD-10-CM | POA: Diagnosis not present

## 2018-09-28 DIAGNOSIS — R162 Hepatomegaly with splenomegaly, not elsewhere classified: Secondary | ICD-10-CM | POA: Diagnosis not present

## 2018-09-28 DIAGNOSIS — N6320 Unspecified lump in the left breast, unspecified quadrant: Secondary | ICD-10-CM | POA: Diagnosis not present

## 2018-09-28 DIAGNOSIS — Z833 Family history of diabetes mellitus: Secondary | ICD-10-CM | POA: Insufficient documentation

## 2018-09-28 DIAGNOSIS — Z8249 Family history of ischemic heart disease and other diseases of the circulatory system: Secondary | ICD-10-CM | POA: Insufficient documentation

## 2018-09-28 DIAGNOSIS — I288 Other diseases of pulmonary vessels: Secondary | ICD-10-CM | POA: Diagnosis not present

## 2018-09-28 DIAGNOSIS — I10 Essential (primary) hypertension: Secondary | ICD-10-CM | POA: Insufficient documentation

## 2018-09-28 DIAGNOSIS — Z79899 Other long term (current) drug therapy: Secondary | ICD-10-CM | POA: Diagnosis not present

## 2018-09-28 DIAGNOSIS — Z792 Long term (current) use of antibiotics: Secondary | ICD-10-CM | POA: Diagnosis not present

## 2018-09-28 DIAGNOSIS — Z79890 Hormone replacement therapy (postmenopausal): Secondary | ICD-10-CM | POA: Diagnosis not present

## 2018-09-28 DIAGNOSIS — I700 Atherosclerosis of aorta: Secondary | ICD-10-CM | POA: Diagnosis not present

## 2018-09-28 DIAGNOSIS — D509 Iron deficiency anemia, unspecified: Secondary | ICD-10-CM | POA: Insufficient documentation

## 2018-09-28 DIAGNOSIS — Z9889 Other specified postprocedural states: Secondary | ICD-10-CM | POA: Diagnosis not present

## 2018-09-28 DIAGNOSIS — E041 Nontoxic single thyroid nodule: Secondary | ICD-10-CM | POA: Diagnosis not present

## 2018-09-28 DIAGNOSIS — C8519 Unspecified B-cell lymphoma, extranodal and solid organ sites: Secondary | ICD-10-CM | POA: Diagnosis not present

## 2018-09-28 DIAGNOSIS — Z96659 Presence of unspecified artificial knee joint: Secondary | ICD-10-CM | POA: Diagnosis not present

## 2018-09-28 LAB — CBC WITH DIFFERENTIAL/PLATELET
BASOS ABS: 0 10*3/uL (ref 0.0–0.1)
BLASTS: 0 %
Band Neutrophils: 0 %
Basophils Relative: 0 %
Eosinophils Absolute: 0 10*3/uL (ref 0.0–0.5)
Eosinophils Relative: 0 %
HCT: 30.8 % — ABNORMAL LOW (ref 36.0–46.0)
Hemoglobin: 10.7 g/dL — ABNORMAL LOW (ref 12.0–15.0)
Lymphocytes Relative: 28 %
Lymphs Abs: 2.2 10*3/uL (ref 0.7–4.0)
MCH: 30.3 pg (ref 26.0–34.0)
MCHC: 34.7 g/dL (ref 30.0–36.0)
MCV: 87.3 fL (ref 80.0–100.0)
MYELOCYTES: 0 %
Metamyelocytes Relative: 0 %
Monocytes Absolute: 0.6 10*3/uL (ref 0.1–1.0)
Monocytes Relative: 7 %
NEUTROS PCT: 55 %
NRBC: 0 % (ref 0.0–0.2)
NRBC: 0 /100{WBCs}
Neutro Abs: 4.3 10*3/uL (ref 1.7–7.7)
Other: 10 %
Platelets: 198 10*3/uL (ref 150–400)
Promyelocytes Relative: 0 %
RBC: 3.53 MIL/uL — ABNORMAL LOW (ref 3.87–5.11)
RDW: 15.8 % — ABNORMAL HIGH (ref 11.5–15.5)
WBC: 7.9 10*3/uL (ref 4.0–10.5)

## 2018-09-28 LAB — PROTIME-INR
INR: 1.7
PROTHROMBIN TIME: 19.9 s — AB (ref 11.4–15.2)

## 2018-09-28 MED ORDER — FENTANYL CITRATE (PF) 100 MCG/2ML IJ SOLN
INTRAMUSCULAR | Status: AC | PRN
  Administered 2018-09-28 (×2): 50 ug via INTRAVENOUS

## 2018-09-28 MED ORDER — SODIUM CHLORIDE 0.9 % IV SOLN
INTRAVENOUS | Status: DC
  Administered 2018-09-28: 09:00:00 via INTRAVENOUS

## 2018-09-28 MED ORDER — HYDROCODONE-ACETAMINOPHEN 5-325 MG PO TABS: 1.0000 | ORAL_TABLET | ORAL | Status: DC | PRN

## 2018-09-28 MED ORDER — MIDAZOLAM HCL 2 MG/2ML IJ SOLN
INTRAMUSCULAR | Status: AC | PRN
  Administered 2018-09-28 (×2): 1 mg via INTRAVENOUS

## 2018-09-28 MED ORDER — FENTANYL CITRATE (PF) 100 MCG/2ML IJ SOLN
INTRAMUSCULAR | Status: AC
  Filled 2018-09-28: qty 2

## 2018-09-28 MED ORDER — MIDAZOLAM HCL 2 MG/2ML IJ SOLN
INTRAMUSCULAR | Status: AC
  Filled 2018-09-28: qty 2

## 2018-09-28 NOTE — Discharge Instructions (Signed)
Moderate Conscious Sedation, Adult, Care After These instructions provide you with information about caring for yourself after your procedure. Your health care provider may also give you more specific instructions. Your treatment has been planned according to current medical practices, but problems sometimes occur. Call your health care provider if you have any problems or questions after your procedure. What can I expect after the procedure? After your procedure, it is common:  To feel sleepy for several hours.  To feel clumsy and have poor balance for several hours.  To have poor judgment for several hours.  To vomit if you eat too soon.  Follow these instructions at home: For at least 24 hours after the procedure:   Do not: ? Participate in activities where you could fall or become injured. ? Drive. ? Use heavy machinery. ? Drink alcohol. ? Take sleeping pills or medicines that cause drowsiness. ? Make important decisions or sign legal documents. ? Take care of children on your own.  Rest. Eating and drinking  Follow the diet recommended by your health care provider.  If you vomit: ? Drink water, juice, or soup when you can drink without vomiting. ? Make sure you have little or no nausea before eating solid foods. General instructions  Have a responsible adult stay with you until you are awake and alert.  Take over-the-counter and prescription medicines only as told by your health care provider.  If you smoke, do not smoke without supervision.  Keep all follow-up visits as told by your health care provider. This is important. Contact a health care provider if:  You keep feeling nauseous or you keep vomiting.  You feel light-headed.  You develop a rash.  You have a fever. Get help right away if:  You have trouble breathing. This information is not intended to replace advice given to you by your health care provider. Make sure you discuss any questions you have  with your health care provider. Document Released: 09/28/2013 Document Revised: 05/12/2016 Document Reviewed: 03/29/2016 Elsevier Interactive Patient Education  2018 [place]. Bone Marrow Aspiration and Bone Marrow Biopsy, Adult, Care After This sheet gives you information about how to care for yourself after your procedure. Your health care provider may also give you more specific instructions. If you have problems or questions, contact your health care provider. What can I expect after the procedure? After the procedure, it is common to have:  Mild pain and tenderness.  Swelling.  Bruising.  Follow these instructions at home:  Take over-the-counter or prescription medicines only as told by your health care provider.  Do not take baths, swim, or use a hot tub until your health care provider approves. Ask if you can take a shower or have a sponge bath.  Follow instructions from your health care provider about how to take care of the puncture site. Make sure you: ? Wash your hands with soap and water before you change your bandage (dressing). If soap and water are not available, use hand sanitizer. ? Change your dressing as told by your health care provider.  Check your puncture siteevery day for signs of infection. Check for: ? More redness, swelling, or pain. ? More fluid or blood. ? Warmth. ? Pus or a bad smell.  Return to your normal activities as told by your health care provider. Ask your health care provider what activities are safe for you.  Do not drive for 24 hours if you were given a medicine to help you relax (sedative).  Keep all follow-up visits as told by your health care provider. This is important. °Contact a health care provider if: °· You have more redness, swelling, or pain around the puncture site. °· You have more fluid or blood coming from the puncture site. °· Your puncture site feels warm to the touch. °· You have pus or a bad smell coming from the  puncture site. °· You have a fever. °· Your pain is not controlled with medicine. °This information is not intended to replace advice given to you by your health care provider. Make sure you discuss any questions you have with your health care provider. °Document Released: 06/27/2005 Document Revised: 06/27/2016 Document Reviewed: 05/21/2016 °Elsevier Interactive Patient Education © 2018 [employer] ° °

## 2018-09-28 NOTE — Procedures (Signed)
  Procedure: R iliac bone marrow biopsy   EBL:   minimal Complications:  none immediate  See full dictation in [facility].  Dillard Cannon MD Main # [phone] Pager  [phone]

## 2018-09-28 NOTE — H&P (Signed)
Chief Complaint: Patient was seen in consultation today for splenomegaly, lymphadenopathy  Referring Physician(s): Ennever,Peter R  Supervising Physician: Arne Cleveland  Patient Status: [facility] - Out-pt  History of Present Illness: Kristina Ramos is a 77 y.o. female with history of arthritis, anxiety/depression, GERD, HTN, thyroid disease who  originally presented to Hematology/Oncology several months ago with iron deficiency anemia.  Over the course of her workup and evaluation she was found to have splenomegaly and lymphadenopathy. Request now made for bone marrow biopsy at the request of Dr. Marin Olp.  Patient presents today in her usual state of health.  She has been NPO.  She does not take blood thinners.   Past Medical History:  Diagnosis Date  . Anemia   . Arthritis   . Depression   . GERD (gastroesophageal reflux disease)   . Heart murmur   . Hx of adenomatous colonic polyps 07/28/2017  . Hyperlipidemia   . Hypertension   . Thyroid disease     Past Surgical History:  Procedure Laterality Date  . TONSILLECTOMY    . TOTAL KNEE ARTHROPLASTY Left 04/25/2014  . TUBAL LIGATION      Allergies: Aspirin  Medications: Prior to Admission medications   Medication Sig Start Date End Date Taking? Authorizing Provider  Cholecalciferol (VITAMIN D) 2000 units CAPS Take 1 capsule by mouth daily.   Yes [provider]  levothyroxine (SYNTHROID, LEVOTHROID) 75 MCG tablet Take 1 tablet (75 mcg total) by mouth daily. 12/30/17  Yes Ann Held, DO  lisinopril-hydrochlorothiazide (PRINZIDE,ZESTORETIC) 10-12.5 MG tablet Take 1 tablet by mouth daily. 03/18/18  Yes Roma Schanz R, DO  amoxicillin (AMOXIL) 500 MG capsule Take 2,000 mg by mouth as needed. only for dental procedeure    [provider]     Family History  Problem Relation Age of Onset  . Diabetes Maternal Grandmother   . Diabetes Paternal Aunt   . Lung cancer Father        smoker  .  Heart disease Father   . CAD Mother   . Cataracts Mother   . Thyroid disease Maternal Aunt   . Colon cancer Neg Hx   . Esophageal cancer Neg Hx   . Pancreatic cancer Neg Hx   . Rectal cancer Neg Hx   . Stomach cancer Neg Hx     Social History   Socioeconomic History  . Marital status: Widowed    Spouse name: Not on file  . Number of children: 2  . Years of education: Not on file  . Highest education level: Not on file  Occupational History  . Occupation: retired  Scientific laboratory technician  . Financial resource strain: Not on file  . Food insecurity:    Worry: Not on file    Inability: Not on file  . Transportation needs:    Medical: Not on file    Non-medical: Not on file  Tobacco Use  . Smoking status: Never Smoker  . Smokeless tobacco: Never Used  Substance and Sexual Activity  . Alcohol use: No  . Drug use: No  . Sexual activity: Not on file  Lifestyle  . Physical activity:    Days per week: Not on file    Minutes per session: Not on file  . Stress: Not on file  Relationships  . Social connections:    Talks on phone: Not on file    Gets together: Not on file    Attends religious service: Not on file  Active member of club or organization: Not on file    Attends meetings of clubs or organizations: Not on file    Relationship status: Not on file  Other Topics Concern  . Not on file  Social History Narrative   Retired widowed 1 son one daughter   4 caffeinated beverages daily   07/01/2017     Review of Systems: A 12 point ROS discussed and pertinent positives are indicated in the HPI above.  All other systems are negative.  Review of Systems  Constitutional: Positive for fatigue and unexpected weight change. Negative for fever.  Respiratory: Positive for shortness of breath. Negative for cough.   Cardiovascular: Negative for chest pain.  Gastrointestinal: Negative for abdominal pain, constipation, diarrhea, nausea and vomiting.  Genitourinary: Negative for dysuria  and flank pain.  Musculoskeletal: Negative for back pain.  Psychiatric/Behavioral: Negative for behavioral problems and confusion.    Vital Signs: BP 110/67   Pulse 91   Temp 97.7 F (36.5 C) (Oral)   Resp 16   Ht 5' 4"  (1.626 m)   Wt 169 lb (76.7 kg)   SpO2 96%   BMI 29.01 kg/m   Physical Exam  Constitutional: She is oriented to person, place, and time. She appears well-developed. No distress.  Neck: Normal range of motion. Neck supple.  Cardiovascular: Normal rate, regular rhythm and normal heart sounds. Exam reveals no gallop and no friction rub.  No murmur heard. Pulmonary/Chest: Effort normal and breath sounds normal. No respiratory distress.  Abdominal: Soft. Bowel sounds are normal. She exhibits no distension.  Neurological: She is alert and oriented to person, place, and time.  Skin: Skin is warm and dry. She is not diaphoretic.  Psychiatric: She has a normal mood and affect. Her behavior is normal. Judgment and thought content normal.  Nursing note and vitals reviewed.   MD Evaluation Airway: WNL Heart: WNL Abdomen: WNL Chest/ Lungs: WNL ASA  Classification: 3 Mallampati/Airway Score: Two   Imaging: Ct Chest W Contrast  Result Date: 09/15/2018 CLINICAL DATA:  25 pound weight loss since April. Low iron. Night sweats. Abdominal pain. Fatigue. EXAM: CT CHEST, ABDOMEN, AND PELVIS WITH CONTRAST TECHNIQUE: Multidetector CT imaging of the chest, abdomen and pelvis was performed following the standard protocol during bolus administration of intravenous contrast. CONTRAST:  149m ISOVUE-300 IOPAMIDOL (ISOVUE-300) INJECTION 61% COMPARISON:  Chest radiograph 03/03/2018.  No prior CTs. FINDINGS: CT CHEST FINDINGS Cardiovascular: Aortic and branch vessel atherosclerosis. Tortuous thoracic aorta. Mild cardiomegaly, without pericardial effusion. Multivessel coronary artery atherosclerosis. No central pulmonary embolism, on this non-dedicated study. Pulmonary artery enlargement,  outflow tract 3.2 cm. Mediastinum/Nodes: Left thyroid nodule is nonspecific at 1.5 cm. Small left supraclavicular nodes are not pathologic by size criteria. No axillary adenopathy. Precarinal node of 11 mm on image 20/2. Subcarinal node of 1.4 cm on image 23/2.  No hilar adenopathy. Lungs/Pleura: No pleural fluid. Volume loss in the posterior right middle lobe. Musculoskeletal: No acute osseous abnormality. Left breast nodule of 12 mm on image 26/2. CT ABDOMEN PELVIS FINDINGS Hepatobiliary: Hepatomegaly at 22.9 cm craniocaudal. Prominent lateral segment left liver lobe. No focal liver lesion. Normal gallbladder, without biliary ductal dilatation. Pancreas: Normal, without mass or ductal dilatation. Spleen: Massive splenomegaly, including at 30 cm craniocaudal. Areas of heterogeneous hypoenhancement within, including within the subcapsular spleen on image 73/2 are suspicious for infarcts. Adrenals/Urinary Tract: Normal left adrenal gland. Right adrenal thickening. Left kidney is compressed by the enlarged spleen. An upper pole left renal fat density lesion of  2.0 cm including on image 52/2. Other left renal lesions which are too small to characterize. Normal right kidney and urinary bladder. Stomach/Bowel: Normal stomach, without wall thickening. Scattered colonic diverticula. Normal terminal ileum. Normal small bowel. Vascular/Lymphatic: Normal caliber of the aorta and branch vessels. Extensive abdominal adenopathy. Porta hepatis adenopathy at 2.6 x 4.6 cm on image 64/2. Aortocaval adenopathy 1.7 cm on image 63/2. No pelvic sidewall adenopathy. Reproductive: Normal uterus and adnexa. Other: Small volume perisplenic and cul-de-sac fluid. Musculoskeletal: Lumbosacral spondylosis including degenerate disc disease at L3-4. IMPRESSION: 1. Findings consistent with a lymphoproliferative process such as lymphoma. Massive splenomegaly with abdominal and less so thoracic adenopathy. Suspect underlying areas of splenic  infarct/ischemia. 2. Small volume abdominopelvic fluid, nonspecific. 3. Coronary artery atherosclerosis. Aortic Atherosclerosis (ICD10-I70.0). 4. Hepatomegaly. Hepatic morphology for which cirrhosis cannot be excluded. Correlate with risk factors. 5. Left breast nodule may represent an intramammary lymph node. Negative mammogram 04/13/2018. This could be re-evaluated on follow-up or more entirely characterized with repeat mammogram/ultrasound. 6. Left thyroid nodule is indeterminate and can be re-evaluated at follow-up. 7. Pulmonary artery enlargement suggests pulmonary arterial hypertension. 8. Left renal angiomyolipoma. These results will be called to the ordering clinician or representative by the Radiologist Assistant, and communication documented in the PACS or zVision Dashboard. Electronically Signed   By: Abigail Miyamoto M.D.   On: 09/15/2018 13:33   Ct Abdomen Pelvis W Contrast  Result Date: 09/15/2018 CLINICAL DATA:  25 pound weight loss since April. Low iron. Night sweats. Abdominal pain. Fatigue. EXAM: CT CHEST, ABDOMEN, AND PELVIS WITH CONTRAST TECHNIQUE: Multidetector CT imaging of the chest, abdomen and pelvis was performed following the standard protocol during bolus administration of intravenous contrast. CONTRAST:  127m ISOVUE-300 IOPAMIDOL (ISOVUE-300) INJECTION 61% COMPARISON:  Chest radiograph 03/03/2018.  No prior CTs. FINDINGS: CT CHEST FINDINGS Cardiovascular: Aortic and branch vessel atherosclerosis. Tortuous thoracic aorta. Mild cardiomegaly, without pericardial effusion. Multivessel coronary artery atherosclerosis. No central pulmonary embolism, on this non-dedicated study. Pulmonary artery enlargement, outflow tract 3.2 cm. Mediastinum/Nodes: Left thyroid nodule is nonspecific at 1.5 cm. Small left supraclavicular nodes are not pathologic by size criteria. No axillary adenopathy. Precarinal node of 11 mm on image 20/2. Subcarinal node of 1.4 cm on image 23/2.  No hilar adenopathy.  Lungs/Pleura: No pleural fluid. Volume loss in the posterior right middle lobe. Musculoskeletal: No acute osseous abnormality. Left breast nodule of 12 mm on image 26/2. CT ABDOMEN PELVIS FINDINGS Hepatobiliary: Hepatomegaly at 22.9 cm craniocaudal. Prominent lateral segment left liver lobe. No focal liver lesion. Normal gallbladder, without biliary ductal dilatation. Pancreas: Normal, without mass or ductal dilatation. Spleen: Massive splenomegaly, including at 30 cm craniocaudal. Areas of heterogeneous hypoenhancement within, including within the subcapsular spleen on image 73/2 are suspicious for infarcts. Adrenals/Urinary Tract: Normal left adrenal gland. Right adrenal thickening. Left kidney is compressed by the enlarged spleen. An upper pole left renal fat density lesion of 2.0 cm including on image 52/2. Other left renal lesions which are too small to characterize. Normal right kidney and urinary bladder. Stomach/Bowel: Normal stomach, without wall thickening. Scattered colonic diverticula. Normal terminal ileum. Normal small bowel. Vascular/Lymphatic: Normal caliber of the aorta and branch vessels. Extensive abdominal adenopathy. Porta hepatis adenopathy at 2.6 x 4.6 cm on image 64/2. Aortocaval adenopathy 1.7 cm on image 63/2. No pelvic sidewall adenopathy. Reproductive: Normal uterus and adnexa. Other: Small volume perisplenic and cul-de-sac fluid. Musculoskeletal: Lumbosacral spondylosis including degenerate disc disease at L3-4. IMPRESSION: 1. Findings consistent with a lymphoproliferative process such as  lymphoma. Massive splenomegaly with abdominal and less so thoracic adenopathy. Suspect underlying areas of splenic infarct/ischemia. 2. Small volume abdominopelvic fluid, nonspecific. 3. Coronary artery atherosclerosis. Aortic Atherosclerosis (ICD10-I70.0). 4. Hepatomegaly. Hepatic morphology for which cirrhosis cannot be excluded. Correlate with risk factors. 5. Left breast nodule may represent an  intramammary lymph node. Negative mammogram 04/13/2018. This could be re-evaluated on follow-up or more entirely characterized with repeat mammogram/ultrasound. 6. Left thyroid nodule is indeterminate and can be re-evaluated at follow-up. 7. Pulmonary artery enlargement suggests pulmonary arterial hypertension. 8. Left renal angiomyolipoma. These results will be called to the ordering clinician or representative by the Radiologist Assistant, and communication documented in the PACS or zVision Dashboard. Electronically Signed   By: Abigail Miyamoto M.D.   On: 09/15/2018 13:33    Labs:  CBC: Recent Labs    03/26/18 1442 05/12/18 1335 07/14/18 1300 09/09/18 0917  WBC 6.9 7.3 7.5 7.7  HGB 8.7* 10.0* 10.6* 10.8*  HCT 27.1* 30.7* 32.2* 32.3*  PLT 221 175 179 178    COAGS: Recent Labs    09/28/18 0901  INR 1.70    BMP: Recent Labs    02/17/18 1443 03/26/18 1442 05/12/18 1345 09/09/18 1032  NA 129* 131 129* 131*  K 4.5 3.5 4.4 4.9  CL 96 93* 94* 95*  CO2 26 28 24 27   GLUCOSE 99 103 101 93  BUN 8 9 9 10   CALCIUM 8.9 9.1 9.2 9.7  CREATININE 0.62 0.80 0.74 0.79  GFRNONAA  --   --  >60 >60  GFRAA  --   --  >60 >60    LIVER FUNCTION TESTS: Recent Labs    12/28/17 1057 03/26/18 1442 05/12/18 1345 09/09/18 1032  BILITOT 0.6 0.4 0.4 0.6  AST 16 24 21 21   ALT 11 13 11 8   ALKPHOS 84 93* 108 164*  PROT 6.4 7.6 8.0 9.1*  ALBUMIN 3.7 3.1* 3.0* 2.7*    TUMOR MARKERS: No results for input(s): AFPTM, CEA, CA199, CHROMGRNA in the last 8760 hours.  Assessment and Plan: Patient with past medical history of iron deficiency anemia presents with complaint of splenomegaly, lymphadenopathy.  IR consulted for bone marrow biopsy at the request of Dr. Marin Olp. Case reviewed by Dr. Vernard Gambles who approves patient for procedure.  Patient presents today in their usual state of health.  She has been NPO and is not currently on blood thinners.   Risks and benefits discussed with the patient  including, but not limited to bleeding, infection, damage to adjacent structures or low yield requiring additional tests.  All of the patient's questions were answered, patient is agreeable to proceed. Consent signed and in chart.   Thank you for this interesting consult.  I greatly enjoyed meeting Rogenia Werntz Palardy and look forward to participating in their care.  A copy of this report was sent to the requesting provider on this date.  Electronically Signed: Docia Barrier, PA 09/28/2018, 9:57 AM   I spent a total of  30 Minutes   in face to face in clinical consultation, greater than 50% of which was counseling/coordinating care for splenomegaly, lymphadenopathy.

## 2018-09-30 ENCOUNTER — Other Ambulatory Visit: Payer: Self-pay

## 2018-09-30 ENCOUNTER — Encounter: Payer: Self-pay | Admitting: Hematology & Oncology

## 2018-09-30 ENCOUNTER — Inpatient Hospital Stay: Payer: Medicare Other | Attending: Hematology & Oncology | Admitting: Hematology & Oncology

## 2018-09-30 ENCOUNTER — Inpatient Hospital Stay: Payer: Medicare Other

## 2018-09-30 DIAGNOSIS — Z5111 Encounter for antineoplastic chemotherapy: Secondary | ICD-10-CM | POA: Insufficient documentation

## 2018-09-30 DIAGNOSIS — C8218 Follicular lymphoma grade II, lymph nodes of multiple sites: Secondary | ICD-10-CM | POA: Diagnosis not present

## 2018-09-30 DIAGNOSIS — Z515 Encounter for palliative care: Secondary | ICD-10-CM | POA: Insufficient documentation

## 2018-09-30 DIAGNOSIS — Z79899 Other long term (current) drug therapy: Secondary | ICD-10-CM | POA: Diagnosis not present

## 2018-09-30 DIAGNOSIS — R161 Splenomegaly, not elsewhere classified: Secondary | ICD-10-CM

## 2018-09-30 DIAGNOSIS — C8300 Small cell B-cell lymphoma, unspecified site: Secondary | ICD-10-CM

## 2018-09-30 DIAGNOSIS — Z7189 Other specified counseling: Secondary | ICD-10-CM

## 2018-09-30 HISTORY — DX: Follicular lymphoma grade ii, lymph nodes of multiple sites: C82.18

## 2018-09-30 HISTORY — DX: Other specified counseling: Z71.89

## 2018-09-30 HISTORY — DX: Small cell B-cell lymphoma, unspecified site: C83.00

## 2018-09-30 LAB — CBC WITH DIFFERENTIAL (CANCER CENTER ONLY)
HCT: 30.5 % — ABNORMAL LOW (ref 36.0–46.0)
Hemoglobin: 10 g/dL — ABNORMAL LOW (ref 12.0–15.0)
Lymphocytes Relative: 43 %
Lymphs Abs: 3.1 10*3/uL (ref 0.7–4.0)
MCH: 28.6 pg (ref 26.0–34.0)
MCHC: 32.8 g/dL (ref 30.0–36.0)
MCV: 87.1 fL (ref 80.0–100.0)
MONO ABS: 0.6 10*3/uL (ref 0.1–1.0)
MONOS PCT: 8 %
NEUTROS PCT: 48 %
Neutro Abs: 3.5 10*3/uL (ref 1.7–7.7)
Platelet Count: 159 10*3/uL (ref 150–400)
RBC: 3.5 MIL/uL — AB (ref 3.87–5.11)
RDW: 15.2 % (ref 11.5–15.5)
WBC: 7.2 10*3/uL (ref 4.0–10.5)
nRBC: 0 % (ref 0.0–0.2)

## 2018-09-30 LAB — CMP (CANCER CENTER ONLY)
ALK PHOS: 141 U/L — AB (ref 26–84)
ALT: 16 U/L (ref 10–47)
ANION GAP: 1 — AB (ref 5–15)
AST: 25 U/L (ref 11–38)
Albumin: 2.6 g/dL — ABNORMAL LOW (ref 3.5–5.0)
BUN: 15 mg/dL (ref 7–22)
CALCIUM: 9.2 mg/dL (ref 8.0–10.3)
CO2: 27 mmol/L (ref 18–33)
Chloride: 101 mmol/L (ref 98–108)
Creatinine: 0.8 mg/dL (ref 0.60–1.20)
GLUCOSE: 124 mg/dL — AB (ref 73–118)
Potassium: 4 mmol/L (ref 3.3–4.7)
Sodium: 129 mmol/L (ref 128–145)
TOTAL PROTEIN: 8.3 g/dL — AB (ref 6.4–8.1)
Total Bilirubin: 0.6 mg/dL (ref 0.2–1.6)

## 2018-09-30 MED ORDER — FAMCICLOVIR 250 MG PO TABS: 250.0000 mg | ORAL_TABLET | Freq: Every day | ORAL | 10 refills | Status: DC

## 2018-09-30 MED ORDER — ALLOPURINOL 100 MG PO TABS: 100.0000 mg | ORAL_TABLET | Freq: Every day | ORAL | 0 refills | Status: DC

## 2018-09-30 NOTE — Addendum Note (Signed)
Addended by: Burney Gauze R on: 09/30/2018 02:26 PM   Modules accepted: Orders

## 2018-09-30 NOTE — Progress Notes (Signed)
Hematology and Oncology Follow Up Visit  Kristina Ramos [id number] 10/04/1941 77 y.o. 09/30/2018   Principle Diagnosis:   Follicular lymphoplasmacytic B-cell Non-Hodgkin's Lymphoma  Current Therapy:    Gazyva/Bendamustine -- cycle #1 to start on 10/04/2018     Interim History:  [name] is back for follow-up.  We have a diagnosis on her.  I felt that given her massive splenomegaly, that we would be able to make a diagnosis with a bone marrow biopsy.  The bone marrow biopsy was done on 09/28/2018.  The pathology report ([id number]) showed a hypercellular marrow with extensive involvement by a low-grade follicular lymphoplasmacytic lymphoma.  This certainly explains the clinical features with respect to her scans.  She, thankfully, has not lost anymore weight.  She has had no fever.  She has had fatigue.  She does have some early satiety.  I suspect that we can help her out immensely by systemic chemotherapy.  I believe that she would be able to tolerate chemotherapy for low-grade non-Hodgkin's lymphoma.  I talked to her about chemotherapy.  I really believe that she would be a great candidate for Gazyva/bendamustine.  I think this would be considered standard therapy for her histology of non-Hodgkin's lymphoma.  I do not think we will need to have a Port-A-Cath put into her.  Since we are just doing 2 days of treatment a month, I would like to try to avoid a Port-A-Cath if possible.  I do not think we need to do a PET scan on her.  I believe a CT scan will show [place] what we need to see with respect to her splenomegaly improvement.  She is had no problems with bowels or bladder.  There is been no cough or shortness of breath.  Overall, her performance status is ECOG 1.  Medications:  Current Outpatient Medications:  .  allopurinol (ZYLOPRIM) 100 MG tablet, Take 1 tablet (100 mg total) by mouth daily., Disp: 30 tablet, Rfl: 0 .  amoxicillin (AMOXIL) 500 MG capsule, Take 2,000 mg by  mouth as needed. only for dental procedeure, Disp: , Rfl:  .  Cholecalciferol (VITAMIN D) 2000 units CAPS, Take 1 capsule by mouth daily., Disp: , Rfl:  .  famciclovir (FAMVIR) 250 MG tablet, Take 1 tablet (250 mg total) by mouth daily., Disp: 30 tablet, Rfl: 10 .  levothyroxine (SYNTHROID, LEVOTHROID) 75 MCG tablet, Take 1 tablet (75 mcg total) by mouth daily., Disp: 90 tablet, Rfl: 3 .  lisinopril-hydrochlorothiazide (PRINZIDE,ZESTORETIC) 10-12.5 MG tablet, Take 1 tablet by mouth daily., Disp: 90 tablet, Rfl: 3  Current Facility-Administered Medications:  .  cyanocobalamin ((VITAMIN B-12)) injection 1,000 mcg, 1,000 mcg, Intramuscular, Once, [employer], [place] R, DO  Allergies:  Allergies  Allergen Reactions  . Aspirin     REACTION: pt states that it irritates her stomach, coated ASA is ok    Past Medical History, Surgical history, Social history, and Family History were reviewed and updated.  Review of Systems: Review of Systems  Constitutional: Positive for fatigue.  HENT:  Negative.   Eyes: Negative.   Respiratory: Negative.   Cardiovascular: Negative.   Gastrointestinal: Positive for abdominal pain and constipation.  Endocrine: Negative.   Musculoskeletal: Positive for flank pain.  Skin: Negative.   Neurological: Negative.   Hematological: Negative.   Psychiatric/Behavioral: Negative.     Physical Exam:  weight is 168 lb (76.2 kg). Her oral temperature is 98.5 F (36.9 C). Her blood pressure is 113/56 (abnormal) and her pulse is 89. Her  respiration is 16 and oxygen saturation is 98%.   Wt Readings from Last 3 Encounters:  09/30/18 168 lb (76.2 kg)  09/28/18 169 lb (76.7 kg)  09/16/18 169 lb (76.7 kg)    Physical Exam  Constitutional: She is oriented to person, place, and time.  HENT:  Head: Normocephalic and atraumatic.  Mouth/Throat: Oropharynx is clear and moist.  Eyes: Pupils are equal, round, and reactive to light. EOM are normal.  Neck: Normal range of  motion.  Cardiovascular: Normal rate, regular rhythm and normal heart sounds.  Pulmonary/Chest: Effort normal and breath sounds normal.  Abdominal: Soft. Bowel sounds are normal.  Abdominal exam shows a soft abdomen.  She has fullness on the left side.  Her spleen tip is palpable below the umbilicus.  Her liver edge might be at the right costal margin.  Musculoskeletal: Normal range of motion. She exhibits no edema, tenderness or deformity.  Lymphadenopathy:    She has no cervical adenopathy.  Neurological: She is alert and oriented to person, place, and time.  Skin: Skin is warm and dry. No rash noted. No erythema.  Psychiatric: She has a normal mood and affect. Her behavior is normal. Judgment and thought content normal.  Vitals reviewed.    Lab Results  Component Value Date   WBC 7.2 09/30/2018   HGB 10.0 (L) 09/30/2018   HCT 30.5 (L) 09/30/2018   MCV 87.1 09/30/2018   PLT 159 09/30/2018     Chemistry      Component Value Date/Time   NA 129 09/30/2018 1510   K 4.0 09/30/2018 1510   CL 101 09/30/2018 1510   CO2 27 09/30/2018 1510   BUN 15 09/30/2018 1510   CREATININE 0.80 09/30/2018 1510      Component Value Date/Time   CALCIUM 9.2 09/30/2018 1510   ALKPHOS 141 (H) 09/30/2018 1510   AST 25 09/30/2018 1510   ALT 16 09/30/2018 1510   BILITOT 0.6 09/30/2018 1510       Impression and Plan: [name] is a 77 year old white female with a lymphoplasmacytic lymphoma.  She presented with massive splenomegaly.  Her spleen we first saw her measured 30 cm.  Again, she has a lymphoplasmacytic lymphoma.  This is a follicular type of lymphoma.  As such, I believe that Gazyva/bendamustine would be a very good approach for her.  I think tha this protocol would be effective.  I suspect that the shrinkage rate should be 90%.  I we will await her protein studies.  I will see what her IgM level is.  I gave her information sheets about each medicine that we are using.  I went over the  side effects.  I really think that she should be able to tolerate the side effects fairly well.  I will reduce the dose of bendamustine down by 10% because of her age.  I told her that we would probably just need to get a ultrasound of her abdomen to assess for response.  I would think that weight gain will also show [place] that she is responding as she will be able to eat a little bit better.  I spent about 50 minutes with her.  All the time was spent face-to-face.  I did have to coordinate getting her chemotherapy protocol set up.  I went over the toxicities of treatment.  I answered all of her questions.  She is grateful for [place] to be able to get treatment started quickly on her.  I just want to try  to get her started on treatment now so that she will start to feel well for the holidays approach.  I will have her on allopurinol and Famvir.  She is at risk for tumor lysis secondary to the lymphoma volume that we are likely dealing with.  I will plan to have her come back to see [place] about 2 weeks after treatment starts and we will see how she is feeling.  Volanda Napoleon, MD 10/10/20195:16 PM

## 2018-10-01 LAB — BETA 2 MICROGLOBULIN, SERUM: BETA 2 MICROGLOBULIN: 7.3 mg/L — AB (ref 0.6–2.4)

## 2018-10-01 LAB — LACTATE DEHYDROGENASE: LDH: 176 U/L (ref 98–192)

## 2018-10-04 ENCOUNTER — Other Ambulatory Visit: Payer: Self-pay | Admitting: *Deleted

## 2018-10-04 ENCOUNTER — Inpatient Hospital Stay: Payer: Medicare Other

## 2018-10-04 VITALS — BP 102/61 | HR 100 | Temp 97.2°F | Resp 20

## 2018-10-04 DIAGNOSIS — R161 Splenomegaly, not elsewhere classified: Secondary | ICD-10-CM | POA: Diagnosis not present

## 2018-10-04 DIAGNOSIS — Z5111 Encounter for antineoplastic chemotherapy: Secondary | ICD-10-CM | POA: Diagnosis not present

## 2018-10-04 DIAGNOSIS — C8218 Follicular lymphoma grade II, lymph nodes of multiple sites: Secondary | ICD-10-CM

## 2018-10-04 DIAGNOSIS — Z79899 Other long term (current) drug therapy: Secondary | ICD-10-CM | POA: Diagnosis not present

## 2018-10-04 DIAGNOSIS — C8300 Small cell B-cell lymphoma, unspecified site: Secondary | ICD-10-CM

## 2018-10-04 LAB — MULTIPLE MYELOMA PANEL, SERUM
ALPHA 1: 0.4 g/dL (ref 0.0–0.4)
ALPHA2 GLOB SERPL ELPH-MCNC: 0.6 g/dL (ref 0.4–1.0)
Albumin SerPl Elph-Mcnc: 3 g/dL (ref 2.9–4.4)
Albumin/Glob SerPl: 0.6 — ABNORMAL LOW (ref 0.7–1.7)
B-GLOBULIN SERPL ELPH-MCNC: 1.4 g/dL — AB (ref 0.7–1.3)
GAMMA GLOB SERPL ELPH-MCNC: 3.3 g/dL — AB (ref 0.4–1.8)
Globulin, Total: 5.6 g/dL — ABNORMAL HIGH (ref 2.2–3.9)
IgA: 61 mg/dL — ABNORMAL LOW (ref 64–422)
IgG (Immunoglobin G), Serum: 339 mg/dL — ABNORMAL LOW (ref 700–1600)
IgM (Immunoglobulin M), Srm: >5850 mg/dL — ABNORMAL HIGH (ref 26–217)
M PROTEIN SERPL ELPH-MCNC: 2.9 g/dL — AB
Total Protein ELP: 8.6 g/dL — ABNORMAL HIGH (ref 6.0–8.5)

## 2018-10-04 LAB — KAPPA/LAMBDA LIGHT CHAINS
Kappa free light chain: 330.4 mg/L — ABNORMAL HIGH (ref 3.3–19.4)
Kappa, lambda light chain ratio: 56 — ABNORMAL HIGH (ref 0.26–1.65)
LAMDA FREE LIGHT CHAINS: 5.9 mg/L (ref 5.7–26.3)

## 2018-10-04 MED ORDER — FAMOTIDINE IN NACL 20-0.9 MG/50ML-% IV SOLN
INTRAVENOUS | Status: AC
  Filled 2018-10-04: qty 100

## 2018-10-04 MED ORDER — ONDANSETRON HCL 8 MG PO TABS: 8.0000 mg | ORAL_TABLET | Freq: Two times a day (BID) | ORAL | 1 refills | Status: DC

## 2018-10-04 MED ORDER — DIPHENHYDRAMINE HCL 50 MG/ML IJ SOLN
INTRAMUSCULAR | Status: AC
  Filled 2018-10-04: qty 1

## 2018-10-04 MED ORDER — ALBUTEROL SULFATE (2.5 MG/3ML) 0.083% IN NEBU
2.5000 mg | INHALATION_SOLUTION | Freq: Once | RESPIRATORY_TRACT | Status: AC
  Administered 2018-10-04: 2.5 mg via RESPIRATORY_TRACT
  Filled 2018-10-04: qty 3

## 2018-10-04 MED ORDER — MONTELUKAST SODIUM 10 MG PO TABS
10.0000 mg | ORAL_TABLET | Freq: Every day | ORAL | Status: DC
Start: 2018-10-04 — End: 2019-05-05
  Administered 2018-10-04: 10 mg via ORAL
  Filled 2018-10-04: qty 1

## 2018-10-04 MED ORDER — METHYLPREDNISOLONE SODIUM SUCC 125 MG IJ SOLR
125.0000 mg | Freq: Once | INTRAMUSCULAR | Status: AC | PRN
  Administered 2018-10-04: 60 mg via INTRAVENOUS

## 2018-10-04 MED ORDER — ACETAMINOPHEN 325 MG PO TABS
ORAL_TABLET | ORAL | Status: AC
  Filled 2018-10-04: qty 2

## 2018-10-04 MED ORDER — PROCHLORPERAZINE MALEATE 10 MG PO TABS: 10.0000 mg | ORAL_TABLET | Freq: Four times a day (QID) | ORAL | 1 refills | Status: DC | PRN

## 2018-10-04 MED ORDER — LORAZEPAM 0.5 MG PO TABS: 0.5000 mg | ORAL_TABLET | Freq: Four times a day (QID) | ORAL | 0 refills | Status: DC | PRN

## 2018-10-04 MED ORDER — MONTELUKAST SODIUM 10 MG PO TABS: 10.0000 mg | ORAL_TABLET | Freq: Every day | ORAL | 0 refills | Status: DC

## 2018-10-04 MED ORDER — DEXAMETHASONE 4 MG PO TABS: 8.0000 mg | ORAL_TABLET | Freq: Every day | ORAL | 1 refills | Status: DC

## 2018-10-04 MED ORDER — ACETAMINOPHEN 325 MG PO TABS
650.0000 mg | ORAL_TABLET | Freq: Once | ORAL | Status: AC
  Administered 2018-10-04: 650 mg via ORAL

## 2018-10-04 MED ORDER — LORAZEPAM 2 MG/ML IJ SOLN
0.5000 mg | Freq: Once | INTRAMUSCULAR | Status: AC
  Administered 2018-10-04: 0.5 mg via INTRAVENOUS

## 2018-10-04 MED ORDER — LORAZEPAM 2 MG/ML IJ SOLN
INTRAMUSCULAR | Status: AC
  Filled 2018-10-04: qty 1

## 2018-10-04 MED ORDER — MEPERIDINE HCL 25 MG/ML IJ SOLN
INTRAMUSCULAR | Status: AC
  Filled 2018-10-04: qty 1

## 2018-10-04 MED ORDER — SODIUM CHLORIDE 0.9 % IV SOLN
Freq: Once | INTRAVENOUS | Status: AC
  Administered 2018-10-04: 09:00:00 via INTRAVENOUS
  Filled 2018-10-04: qty 250

## 2018-10-04 MED ORDER — METHYLPREDNISOLONE SODIUM SUCC 125 MG IJ SOLR
INTRAMUSCULAR | Status: AC
  Filled 2018-10-04: qty 2

## 2018-10-04 MED ORDER — MEPERIDINE HCL 25 MG/ML IJ SOLN
25.0000 mg | Freq: Once | INTRAMUSCULAR | Status: AC
  Administered 2018-10-04: 25 mg via INTRAVENOUS

## 2018-10-04 MED ORDER — PALONOSETRON HCL INJECTION 0.25 MG/5ML
0.2500 mg | Freq: Once | INTRAVENOUS | Status: AC
  Administered 2018-10-04: 0.25 mg via INTRAVENOUS

## 2018-10-04 MED ORDER — SODIUM CHLORIDE 0.9 % IV SOLN
1000.0000 mg | Freq: Once | INTRAVENOUS | Status: AC
  Administered 2018-10-04: 1000 mg via INTRAVENOUS
  Filled 2018-10-04: qty 40

## 2018-10-04 MED ORDER — PALONOSETRON HCL INJECTION 0.25 MG/5ML
INTRAVENOUS | Status: AC
  Filled 2018-10-04: qty 5

## 2018-10-04 MED ORDER — FAMOTIDINE IN NACL 20-0.9 MG/50ML-% IV SOLN
20.0000 mg | Freq: Once | INTRAVENOUS | Status: AC | PRN
  Administered 2018-10-04: 20 mg via INTRAVENOUS

## 2018-10-04 MED ORDER — SODIUM CHLORIDE 0.9 % IV SOLN
81.0000 mg/m2 | Freq: Once | INTRAVENOUS | Status: DC
  Filled 2018-10-04: qty 6

## 2018-10-04 MED ORDER — DIPHENHYDRAMINE HCL 50 MG/ML IJ SOLN
50.0000 mg | Freq: Once | INTRAMUSCULAR | Status: AC
  Administered 2018-10-04: 50 mg via INTRAVENOUS

## 2018-10-04 MED ORDER — SODIUM CHLORIDE 0.9 % IV SOLN
20.0000 mg | Freq: Once | INTRAVENOUS | Status: AC
  Administered 2018-10-04: 20 mg via INTRAVENOUS
  Filled 2018-10-04: qty 2

## 2018-10-04 MED FILL — MONTELUKAST SOD 10 MG TAB: 10 | 10 days supply | Qty: 10 | Fill #0

## 2018-10-04 NOTE — Patient Instructions (Addendum)
Kristina Ramos Discharge Instructions for Patients Receiving Chemotherapy  Today you received the following chemotherapy agents Gazyva, Bendamustine  To help prevent nausea and vomiting after your treatment, we encourage you to take your nausea medication. 1) Prochlorperazine (Compazine) Take 1 tablet by mouth every 6 hours AS NEEDED for nausea. 2) Lorazepam (Ativan) Can take 1 tablet by mouth every 6 hours as needed for nausea or anxiety or sleep. 3)Beginning Thursday 10/08/19 take Dexamethasone (Decadron) Take 2 tablets by mouth daily.  Take with food.  Take for 2 days only. 4) Zofran can take by mouth beginning Wednesday 10/07/19 take for nausea and vomiting AS NEEDED    If you develop nausea and vomiting that is not controlled by your nausea medication, call the clinic.   BELOW ARE SYMPTOMS THAT SHOULD BE REPORTED IMMEDIATELY:  *FEVER GREATER THAN 100.5 F  *CHILLS WITH OR WITHOUT FEVER  NAUSEA AND VOMITING THAT IS NOT CONTROLLED WITH YOUR NAUSEA MEDICATION  *UNUSUAL SHORTNESS OF BREATH  *UNUSUAL BRUISING OR BLEEDING  TENDERNESS IN MOUTH AND THROAT WITH OR WITHOUT PRESENCE OF ULCERS  *URINARY PROBLEMS  *BOWEL PROBLEMS  UNUSUAL RASH Items with * indicate a potential emergency and should be followed up as soon as possible.  Feel free to call the clinic should you have any questions or concerns. The clinic phone number is [phone].  Please show the [place] at check-in to the Emergency Department and triage nurse.

## 2018-10-04 NOTE — Progress Notes (Signed)
1300 Patient started feeling hot.  Temperature in the room lowered. VSS at this time.  Stayed with patient and then she complained of her head felt full and was going to explode.  Gazyva stopped.  Dr. Marin Olp here in room.  Ordered Solumedrol 60 and Pepcid 40 mg IV.  O2 sat 93.  O2 @ 2 L given and albuterol given.  Patient feeling better.  1350.  Patient started experiencing chills.  Warm blankets placed on patient and Dr. Marin Olp called into room.  Still says head feels full.  O2sat is 99.  Ordered Ativan IVP per Dr Marin Olp orders as well as Demerol 25 mg.  Patient sleeping.  BP 130/72.  Pulse 120.   1300 Dr. Marin Olp ordered Dyann Kief restarted.  Restarted at 28 cc/hr.  1800.  Patient alert, oriented and feeling well.  VSS.  Dr. Marin Olp ordered Dyann Kief to be stopped.  Approx 50-75 cc remained in bag.  Patient discharged .

## 2018-10-05 ENCOUNTER — Inpatient Hospital Stay: Payer: Medicare Other

## 2018-10-05 ENCOUNTER — Other Ambulatory Visit: Payer: Self-pay | Admitting: Family

## 2018-10-05 VITALS — BP 116/58 | HR 100 | Temp 97.7°F | Resp 19

## 2018-10-05 DIAGNOSIS — Z79899 Other long term (current) drug therapy: Secondary | ICD-10-CM | POA: Diagnosis not present

## 2018-10-05 DIAGNOSIS — R161 Splenomegaly, not elsewhere classified: Secondary | ICD-10-CM | POA: Diagnosis not present

## 2018-10-05 DIAGNOSIS — C8218 Follicular lymphoma grade II, lymph nodes of multiple sites: Secondary | ICD-10-CM | POA: Diagnosis not present

## 2018-10-05 DIAGNOSIS — Z5111 Encounter for antineoplastic chemotherapy: Secondary | ICD-10-CM | POA: Diagnosis not present

## 2018-10-05 DIAGNOSIS — D500 Iron deficiency anemia secondary to blood loss (chronic): Secondary | ICD-10-CM

## 2018-10-05 DIAGNOSIS — E538 Deficiency of other specified B group vitamins: Secondary | ICD-10-CM | POA: Insufficient documentation

## 2018-10-05 LAB — HEPATITIS PANEL, ACUTE
HEP B S AG: NEGATIVE
Hep A IgM: NEGATIVE
Hep B C IgM: NEGATIVE

## 2018-10-05 MED ORDER — DEXAMETHASONE SODIUM PHOSPHATE 10 MG/ML IJ SOLN
10.0000 mg | Freq: Once | INTRAMUSCULAR | Status: AC
  Administered 2018-10-05: 10 mg via INTRAVENOUS

## 2018-10-05 MED ORDER — CYANOCOBALAMIN 1000 MCG/ML IJ SOLN
INTRAMUSCULAR | Status: AC
  Filled 2018-10-05: qty 1

## 2018-10-05 MED ORDER — SODIUM CHLORIDE 0.9 % IV SOLN: 10.0000 mg | Freq: Once | INTRAVENOUS | Status: DC

## 2018-10-05 MED ORDER — CYANOCOBALAMIN 1000 MCG/ML IJ SOLN
1000.0000 ug | Freq: Once | INTRAMUSCULAR | Status: AC
  Administered 2018-10-05: 1000 ug via INTRAMUSCULAR

## 2018-10-05 MED ORDER — SODIUM CHLORIDE 0.9 % IV SOLN
Freq: Once | INTRAVENOUS | Status: AC
  Administered 2018-10-05: 13:00:00 via INTRAVENOUS
  Filled 2018-10-05: qty 250

## 2018-10-05 MED ORDER — SODIUM CHLORIDE 0.9 % IV SOLN
81.0000 mg/m2 | Freq: Once | INTRAVENOUS | Status: AC
  Administered 2018-10-05: 150 mg via INTRAVENOUS
  Filled 2018-10-05: qty 6

## 2018-10-05 MED ORDER — DEXAMETHASONE SODIUM PHOSPHATE 10 MG/ML IJ SOLN
INTRAMUSCULAR | Status: AC
  Filled 2018-10-05: qty 1

## 2018-10-05 NOTE — Patient Instructions (Signed)

## 2018-10-06 ENCOUNTER — Inpatient Hospital Stay: Payer: Medicare Other

## 2018-10-06 ENCOUNTER — Other Ambulatory Visit: Payer: Medicare Other

## 2018-10-06 ENCOUNTER — Ambulatory Visit: Payer: Medicare Other

## 2018-10-06 ENCOUNTER — Ambulatory Visit: Payer: Medicare Other | Admitting: Family

## 2018-10-06 ENCOUNTER — Other Ambulatory Visit: Payer: Self-pay | Admitting: Hematology

## 2018-10-06 VITALS — BP 121/56 | HR 83 | Temp 97.7°F | Resp 18

## 2018-10-06 DIAGNOSIS — E538 Deficiency of other specified B group vitamins: Secondary | ICD-10-CM

## 2018-10-06 DIAGNOSIS — Z5111 Encounter for antineoplastic chemotherapy: Secondary | ICD-10-CM | POA: Diagnosis not present

## 2018-10-06 DIAGNOSIS — C8218 Follicular lymphoma grade II, lymph nodes of multiple sites: Secondary | ICD-10-CM | POA: Diagnosis not present

## 2018-10-06 DIAGNOSIS — R161 Splenomegaly, not elsewhere classified: Secondary | ICD-10-CM | POA: Diagnosis not present

## 2018-10-06 DIAGNOSIS — Z79899 Other long term (current) drug therapy: Secondary | ICD-10-CM | POA: Diagnosis not present

## 2018-10-06 DIAGNOSIS — D508 Other iron deficiency anemias: Secondary | ICD-10-CM

## 2018-10-06 MED ORDER — SODIUM CHLORIDE 0.9 % IV SOLN: 10.0000 mg | Freq: Once | INTRAVENOUS | Status: DC

## 2018-10-06 MED ORDER — DEXAMETHASONE SODIUM PHOSPHATE 10 MG/ML IJ SOLN
INTRAMUSCULAR | Status: AC
  Filled 2018-10-06: qty 1

## 2018-10-06 MED ORDER — SODIUM CHLORIDE 0.9 % IV SOLN: 510.0000 mg | Freq: Once | INTRAVENOUS | Status: DC

## 2018-10-06 MED ORDER — SODIUM CHLORIDE 0.9 % IV SOLN
81.0000 mg/m2 | Freq: Once | INTRAVENOUS | Status: AC
  Administered 2018-10-06: 150 mg via INTRAVENOUS
  Filled 2018-10-06: qty 6

## 2018-10-06 MED ORDER — SODIUM CHLORIDE 0.9 % IV SOLN
Freq: Once | INTRAVENOUS | Status: AC
  Administered 2018-10-06: 12:00:00 via INTRAVENOUS
  Filled 2018-10-06: qty 250

## 2018-10-06 MED ORDER — DEXAMETHASONE SODIUM PHOSPHATE 10 MG/ML IJ SOLN
10.0000 mg | Freq: Once | INTRAMUSCULAR | Status: AC
  Administered 2018-10-06: 10 mg via INTRAVENOUS

## 2018-10-06 NOTE — Patient Instructions (Signed)

## 2018-10-08 ENCOUNTER — Encounter: Payer: Self-pay | Admitting: Hematology & Oncology

## 2018-10-12 ENCOUNTER — Inpatient Hospital Stay: Payer: Medicare Other

## 2018-10-12 ENCOUNTER — Inpatient Hospital Stay (HOSPITAL_BASED_OUTPATIENT_CLINIC_OR_DEPARTMENT_OTHER): Payer: Medicare Other | Admitting: Family

## 2018-10-12 ENCOUNTER — Encounter (HOSPITAL_COMMUNITY): Payer: Self-pay | Admitting: Hematology & Oncology

## 2018-10-12 VITALS — BP 119/59 | HR 88 | Temp 98.0°F | Resp 16

## 2018-10-12 DIAGNOSIS — R161 Splenomegaly, not elsewhere classified: Secondary | ICD-10-CM

## 2018-10-12 DIAGNOSIS — Z5111 Encounter for antineoplastic chemotherapy: Secondary | ICD-10-CM | POA: Diagnosis not present

## 2018-10-12 DIAGNOSIS — D508 Other iron deficiency anemias: Secondary | ICD-10-CM

## 2018-10-12 DIAGNOSIS — R61 Generalized hyperhidrosis: Secondary | ICD-10-CM

## 2018-10-12 DIAGNOSIS — E559 Vitamin D deficiency, unspecified: Secondary | ICD-10-CM

## 2018-10-12 DIAGNOSIS — C8218 Follicular lymphoma grade II, lymph nodes of multiple sites: Secondary | ICD-10-CM

## 2018-10-12 DIAGNOSIS — C8300 Small cell B-cell lymphoma, unspecified site: Secondary | ICD-10-CM

## 2018-10-12 DIAGNOSIS — R5383 Other fatigue: Secondary | ICD-10-CM

## 2018-10-12 DIAGNOSIS — R634 Abnormal weight loss: Secondary | ICD-10-CM

## 2018-10-12 DIAGNOSIS — Z79899 Other long term (current) drug therapy: Secondary | ICD-10-CM | POA: Diagnosis not present

## 2018-10-12 LAB — CMP (CANCER CENTER ONLY)
ALBUMIN: 3 g/dL — AB (ref 3.5–5.0)
ALT: 24 U/L (ref 10–47)
AST: 21 U/L (ref 11–38)
Alkaline Phosphatase: 85 U/L — ABNORMAL HIGH (ref 26–84)
Anion gap: 1 — ABNORMAL LOW (ref 5–15)
BUN: 18 mg/dL (ref 7–22)
CALCIUM: 9.3 mg/dL (ref 8.0–10.3)
CO2: 30 mmol/L (ref 18–33)
CREATININE: 0.8 mg/dL (ref 0.60–1.20)
Chloride: 100 mmol/L (ref 98–108)
GLUCOSE: 101 mg/dL (ref 73–118)
Potassium: 4 mmol/L (ref 3.3–4.7)
Sodium: 131 mmol/L (ref 128–145)
Total Bilirubin: 0.4 mg/dL (ref 0.2–1.6)
Total Protein: 7.8 g/dL (ref 6.4–8.1)

## 2018-10-12 LAB — CBC WITH DIFFERENTIAL (CANCER CENTER ONLY)
Abs Immature Granulocytes: 0.04 10*3/uL (ref 0.00–0.07)
BASOS ABS: 0 10*3/uL (ref 0.0–0.1)
BASOS PCT: 1 %
Eosinophils Absolute: 0 10*3/uL (ref 0.0–0.5)
Eosinophils Relative: 1 %
HCT: 37.6 % (ref 36.0–46.0)
Hemoglobin: 12 g/dL (ref 12.0–15.0)
IMMATURE GRANULOCYTES: 1 %
LYMPHS ABS: 0.3 10*3/uL — AB (ref 0.7–4.0)
Lymphocytes Relative: 9 %
MCH: 27.8 pg (ref 26.0–34.0)
MCHC: 31.9 g/dL (ref 30.0–36.0)
MCV: 87 fL (ref 80.0–100.0)
Monocytes Absolute: 0.3 10*3/uL (ref 0.1–1.0)
Monocytes Relative: 9 %
NEUTROS PCT: 79 %
Neutro Abs: 2.8 10*3/uL (ref 1.7–7.7)
PLATELETS: 215 10*3/uL (ref 150–400)
RBC: 4.32 MIL/uL (ref 3.87–5.11)
RDW: 15 % (ref 11.5–15.5)
WBC Count: 3.5 10*3/uL — ABNORMAL LOW (ref 4.0–10.5)
nRBC: 0 % (ref 0.0–0.2)

## 2018-10-12 LAB — RETICULOCYTES
IMMATURE RETIC FRACT: 3.5 % (ref 2.3–15.9)
RBC.: 4.32 MIL/uL (ref 3.87–5.11)
RETIC COUNT ABSOLUTE: 69.1 10*3/uL (ref 19.0–186.0)
RETIC CT PCT: 1.6 % (ref 0.4–3.1)

## 2018-10-12 LAB — IRON AND TIBC
Iron: 88 ug/dL (ref 41–142)
SATURATION RATIOS: 35 % (ref 21–57)
TIBC: 254 ug/dL (ref 236–444)
UIBC: 166 ug/dL

## 2018-10-12 LAB — LACTATE DEHYDROGENASE: LDH: 111 U/L (ref 98–192)

## 2018-10-12 LAB — FERRITIN: Ferritin: 814 ng/mL — ABNORMAL HIGH (ref 11–307)

## 2018-10-12 MED ORDER — SODIUM CHLORIDE 0.9 % IV SOLN
Freq: Once | INTRAVENOUS | Status: AC
  Administered 2018-10-12: 09:00:00 via INTRAVENOUS
  Filled 2018-10-12: qty 250

## 2018-10-12 NOTE — Progress Notes (Unsigned)
Upon assessment patient stated that she is not sure if she took he singulair at home. She " two of her medications, but not one of them".  Kristina Peace, NP informed. After she consulted with our pharmacist Lattie Haw) it was decided that the patient should return tomorrow morning after taking her singulair.

## 2018-10-12 NOTE — Progress Notes (Addendum)
Hematology and Oncology Follow Up Visit  Shawne Bulow [id number] 06/10/1941 77 y.o. 10/12/2018   Principle Diagnosis:  Follicular lymphoplasmacytic B-cell Non-Hodgkin's Lymphoma  Current Therapy:   Gazyva/Bendamustine - cycle 1 to started on 10/04/2018   Interim History: Ms. Bonaventura is here today for follow-up and treatment. She is doing much better since her Dyann Kief reaction last week.  She has been taking her Singulair daily along with the famvir and allopurinol.  Her spleen is still enlarged and palpable on exam. She states that she tries to eat and has an appetite but it is hard because she feels full due to her spleen. She is staying well hydrated. Her weight is down 11 lbs since 10/10.  She will try drinking 2 ensure or boost daily.  She feels that her hot flashes and night sweats are improving and happening less often.  No fever, chills, n/v, cough, rash, dizziness, SOB, chest pain, palpitations, abdominal pain or changes in bowel or bladder habits.  She takes a stool softener when needed for constipation.  No swelling, tenderness, numbness or tingling in her extremities.  No lymphadenopathy noted on exam.  No episodes of bleeding, no petechiae of abnormal bruising.   ECOG Performance Status: 1 - Symptomatic but completely ambulatory  Medications:  Allergies as of 10/12/2018      Reactions   Aspirin    REACTION: pt states that it irritates her stomach, coated ASA is ok      Medication List        Accurate as of 10/12/18  8:24 AM. Always use your most recent med list.          allopurinol 100 MG tablet Commonly known as:  ZYLOPRIM Take 1 tablet (100 mg total) by mouth daily.   amoxicillin 500 MG capsule Commonly known as:  AMOXIL Take 2,000 mg by mouth as needed. only for dental procedeure   dexamethasone 4 MG tablet Commonly known as:  DECADRON Take 2 tablets (8 mg total) by mouth daily. Start the day after chemotherapy for 2 days. Take with food.     famciclovir 250 MG tablet Commonly known as:  FAMVIR Take 1 tablet (250 mg total) by mouth daily.   levothyroxine 75 MCG tablet Commonly known as:  SYNTHROID, LEVOTHROID Take 1 tablet (75 mcg total) by mouth daily.   lisinopril-hydrochlorothiazide 10-12.5 MG tablet Commonly known as:  PRINZIDE,ZESTORETIC Take 1 tablet by mouth daily.   LORazepam 0.5 MG tablet Commonly known as:  ATIVAN Take 1 tablet (0.5 mg total) by mouth every 6 (six) hours as needed (Nausea or vomiting).   montelukast 10 MG tablet Commonly known as:  SINGULAIR Take 1 tablet (10 mg total) by mouth at bedtime.   ondansetron 8 MG tablet Commonly known as:  ZOFRAN Take 1 tablet (8 mg total) by mouth 2 (two) times daily. Start second day after chemotherapy. Then take as needed for nausea or vomiting.   prochlorperazine 10 MG tablet Commonly known as:  COMPAZINE Take 1 tablet (10 mg total) by mouth every 6 (six) hours as needed (Nausea or vomiting).   Vitamin D 2000 units Caps Take 1 capsule by mouth daily.       Allergies:  Allergies  Allergen Reactions  . Aspirin     REACTION: pt states that it irritates her stomach, coated ASA is ok    Past Medical History, Surgical history, Social history, and Family History were reviewed and updated.  Review of Systems: All other 10 point review of systems  is negative.   Physical Exam:  vitals were not taken for this visit.   Wt Readings from Last 3 Encounters:  09/30/18 168 lb (76.2 kg)  09/28/18 169 lb (76.7 kg)  09/16/18 169 lb (76.7 kg)    Ocular: Sclerae unicteric, pupils equal, round and reactive to light Ear-nose-throat: Oropharynx clear, dentition fair Lymphatic: No cervical, supraclavicular or axillary adenopathy Lungs no rales or rhonchi, good excursion bilaterally Heart regular rate and rhythm, no murmur appreciated Abd soft, nontender, positive bowel sounds, spleen enlarged and palpated on exam, no liver tip palpated, no fluid wave MSK no  focal spinal tenderness, no joint edema Neuro: non-focal, well-oriented, appropriate affect Breasts: Deferred   Lab Results  Component Value Date   WBC 3.5 (L) 10/12/2018   HGB 12.0 10/12/2018   HCT 37.6 10/12/2018   MCV 87.0 10/12/2018   PLT 215 10/12/2018   Lab Results  Component Value Date   FERRITIN 698 (H) 09/09/2018   IRON 34 (L) 09/09/2018   TIBC 206 (L) 09/09/2018   UIBC 173 09/09/2018   IRONPCTSAT 16 (L) 09/09/2018   Lab Results  Component Value Date   RETICCTPCT 1.6 10/12/2018   RBC 4.32 10/12/2018   RBC 4.32 10/12/2018   RETICCTABS 105,600 (H) 03/03/2018   Lab Results  Component Value Date   KPAFRELGTCHN 330.4 (H) 09/30/2018   LAMBDASER 5.9 09/30/2018   KAPLAMBRATIO 56.00 (H) 09/30/2018   Lab Results  Component Value Date   IGGSERUM 339 (L) 09/30/2018   IGA 61 (L) 09/30/2018   IGMSERUM >5,850 (H) 09/30/2018   Lab Results  Component Value Date   TOTALPROTELP 8.6 (H) 09/30/2018     Chemistry      Component Value Date/Time   NA 129 09/30/2018 1510   K 4.0 09/30/2018 1510   CL 101 09/30/2018 1510   CO2 27 09/30/2018 1510   BUN 15 09/30/2018 1510   CREATININE 0.80 09/30/2018 1510      Component Value Date/Time   CALCIUM 9.2 09/30/2018 1510   ALKPHOS 141 (H) 09/30/2018 1510   AST 25 09/30/2018 1510   ALT 16 09/30/2018 1510   BILITOT 0.6 09/30/2018 1510       Impression and Plan: Ms. 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 She is feeling a little better but her weight loss continues to be concerning. We will have her try adding 2 boost or ensure daily. If her weight has not improved in the next week we will need to look at other options for weight gain.  Her IgM level on 10/10 was > 5,850 mg/dL, M-spike 2.9 g/dL.  She is unsure as to whether or not she took her singulair this morning. I spoke with Lattie Haw in pharmacy and felt it best we treat her tomorrow due to the reaction she had at  her last infusion.  She is agreeable to this and will make sure she takes her singulair in the morning before she comes.  She will get a new schedule today. We will see her back in another 2 weeks for follow-up to assess her weight.  She will contact our office with any questions or concerns. We can certainly see her sooner if need be.   Laverna Peace, NP 10/22/20198:24 AM

## 2018-10-13 ENCOUNTER — Inpatient Hospital Stay: Payer: Medicare Other

## 2018-10-13 ENCOUNTER — Other Ambulatory Visit: Payer: Self-pay | Admitting: *Deleted

## 2018-10-13 VITALS — BP 112/56 | HR 82 | Temp 98.0°F | Resp 16

## 2018-10-13 DIAGNOSIS — Z79899 Other long term (current) drug therapy: Secondary | ICD-10-CM | POA: Diagnosis not present

## 2018-10-13 DIAGNOSIS — R161 Splenomegaly, not elsewhere classified: Secondary | ICD-10-CM | POA: Diagnosis not present

## 2018-10-13 DIAGNOSIS — C8218 Follicular lymphoma grade II, lymph nodes of multiple sites: Secondary | ICD-10-CM

## 2018-10-13 DIAGNOSIS — Z5111 Encounter for antineoplastic chemotherapy: Secondary | ICD-10-CM | POA: Diagnosis not present

## 2018-10-13 LAB — VITAMIN D 25 HYDROXY (VIT D DEFICIENCY, FRACTURES): VIT D 25 HYDROXY: 39.6 ng/mL (ref 30.0–100.0)

## 2018-10-13 MED ORDER — MONTELUKAST SODIUM 10 MG PO TABS
10.0000 mg | ORAL_TABLET | Freq: Every day | ORAL | Status: DC
  Administered 2018-10-13: 10 mg via ORAL
  Filled 2018-10-13: qty 1

## 2018-10-13 MED ORDER — SODIUM CHLORIDE 0.9 % IV SOLN
1000.0000 mg | Freq: Once | INTRAVENOUS | Status: AC
  Administered 2018-10-13: 1000 mg via INTRAVENOUS
  Filled 2018-10-13: qty 40

## 2018-10-13 MED ORDER — SODIUM CHLORIDE 0.9 % IV SOLN
20.0000 mg | Freq: Once | INTRAVENOUS | Status: AC
  Administered 2018-10-13: 20 mg via INTRAVENOUS
  Filled 2018-10-13: qty 2

## 2018-10-13 MED ORDER — DEXAMETHASONE SODIUM PHOSPHATE 10 MG/ML IJ SOLN
INTRAMUSCULAR | Status: AC
  Filled 2018-10-13: qty 1

## 2018-10-13 MED ORDER — DIPHENHYDRAMINE HCL 50 MG/ML IJ SOLN
50.0000 mg | Freq: Once | INTRAMUSCULAR | Status: AC
  Administered 2018-10-13: 50 mg via INTRAVENOUS

## 2018-10-13 MED ORDER — ACETAMINOPHEN 325 MG PO TABS
650.0000 mg | ORAL_TABLET | Freq: Once | ORAL | Status: AC
  Administered 2018-10-13: 650 mg via ORAL

## 2018-10-13 MED ORDER — SODIUM CHLORIDE 0.9 % IV SOLN
Freq: Once | INTRAVENOUS | Status: AC
  Administered 2018-10-13: 09:00:00 via INTRAVENOUS
  Filled 2018-10-13: qty 250

## 2018-10-13 MED ORDER — ACETAMINOPHEN 325 MG PO TABS
ORAL_TABLET | ORAL | Status: AC
  Filled 2018-10-13: qty 2

## 2018-10-13 MED ORDER — MONTELUKAST SODIUM 10 MG PO TABS: 10.0000 mg | ORAL_TABLET | Freq: Every day | ORAL | 3 refills | Status: DC

## 2018-10-13 MED ORDER — DIPHENHYDRAMINE HCL 50 MG/ML IJ SOLN
INTRAMUSCULAR | Status: AC
  Filled 2018-10-13: qty 1

## 2018-10-13 NOTE — Patient Instructions (Signed)
[facility] Discharge Instructions for Patients Receiving Chemotherapy  Today you received the following chemotherapy agents Gazyva.  To help prevent nausea and vomiting after your treatment, we encourage you to take your nausea medications as prescribed.   If you develop nausea and vomiting that is not controlled by your nausea medication, call the clinic.   BELOW ARE SYMPTOMS THAT SHOULD BE REPORTED IMMEDIATELY:  *FEVER GREATER THAN 100.5 F  *CHILLS WITH OR WITHOUT FEVER  NAUSEA AND VOMITING THAT IS NOT CONTROLLED WITH YOUR NAUSEA MEDICATION  *UNUSUAL SHORTNESS OF BREATH  *UNUSUAL BRUISING OR BLEEDING  TENDERNESS IN MOUTH AND THROAT WITH OR WITHOUT PRESENCE OF ULCERS  *URINARY PROBLEMS  *BOWEL PROBLEMS  UNUSUAL RASH Items with * indicate a potential emergency and should be followed up as soon as possible.  Feel free to call the clinic should you have any questions or concerns. The clinic phone number is [phone].  Please show the [facility] at check-in to the Emergency Department and triage nurse.

## 2018-10-14 ENCOUNTER — Other Ambulatory Visit: Payer: Medicare Other

## 2018-10-14 ENCOUNTER — Ambulatory Visit: Payer: Medicare Other | Admitting: Family

## 2018-10-18 ENCOUNTER — Telehealth: Payer: Self-pay | Admitting: *Deleted

## 2018-10-18 NOTE — Telephone Encounter (Signed)
Call received from patient stating that she has a slight raised red rash on the front of her neck with facial flushing and would like to know if this will cause any problems with her getting her treatment tomorrow.  Pt states that she is having no SOB or difficulty swallowing and no other complaints.   Jory Ee NP notified and order received for pt to take Benadryl 25 mg PO and to come in as scheduled tomorrow.  Pt appreciative of call back and has no further questions or concerns at this time.

## 2018-10-19 ENCOUNTER — Inpatient Hospital Stay: Payer: Medicare Other

## 2018-10-19 ENCOUNTER — Ambulatory Visit: Payer: Medicare Other | Admitting: Family

## 2018-10-19 ENCOUNTER — Other Ambulatory Visit: Payer: Self-pay | Admitting: *Deleted

## 2018-10-19 ENCOUNTER — Other Ambulatory Visit: Payer: Medicare Other

## 2018-10-19 ENCOUNTER — Ambulatory Visit: Payer: Medicare Other

## 2018-10-19 VITALS — BP 97/50 | HR 96 | Temp 97.8°F | Resp 80 | Wt 159.0 lb

## 2018-10-19 DIAGNOSIS — C8218 Follicular lymphoma grade II, lymph nodes of multiple sites: Secondary | ICD-10-CM

## 2018-10-19 DIAGNOSIS — Z79899 Other long term (current) drug therapy: Secondary | ICD-10-CM | POA: Diagnosis not present

## 2018-10-19 DIAGNOSIS — Z5111 Encounter for antineoplastic chemotherapy: Secondary | ICD-10-CM | POA: Diagnosis not present

## 2018-10-19 DIAGNOSIS — R161 Splenomegaly, not elsewhere classified: Secondary | ICD-10-CM | POA: Diagnosis not present

## 2018-10-19 LAB — CMP (CANCER CENTER ONLY)
ALBUMIN: 3 g/dL — AB (ref 3.5–5.0)
ALT: 17 U/L (ref 10–47)
AST: 15 U/L (ref 11–38)
Alkaline Phosphatase: 84 U/L (ref 26–84)
Anion gap: 0 — ABNORMAL LOW (ref 5–15)
BILIRUBIN TOTAL: 0.7 mg/dL (ref 0.2–1.6)
BUN: 17 mg/dL (ref 7–22)
CALCIUM: 8.9 mg/dL (ref 8.0–10.3)
CHLORIDE: 98 mmol/L (ref 98–108)
CO2: 28 mmol/L (ref 18–33)
CREATININE: 0.9 mg/dL (ref 0.60–1.20)
Glucose, Bld: 111 mg/dL (ref 73–118)
Potassium: 4 mmol/L (ref 3.3–4.7)
SODIUM: 123 mmol/L — AB (ref 128–145)
Total Protein: 6.5 g/dL (ref 6.4–8.1)

## 2018-10-19 LAB — CBC WITH DIFFERENTIAL (CANCER CENTER ONLY)
Abs Immature Granulocytes: 0.02 10*3/uL (ref 0.00–0.07)
BASOS ABS: 0 10*3/uL (ref 0.0–0.1)
Basophils Relative: 1 %
EOS ABS: 0.2 10*3/uL (ref 0.0–0.5)
EOS PCT: 4 %
HEMATOCRIT: 34.4 % — AB (ref 36.0–46.0)
HEMOGLOBIN: 11.3 g/dL — AB (ref 12.0–15.0)
Immature Granulocytes: 1 %
LYMPHS PCT: 17 %
Lymphs Abs: 0.7 10*3/uL (ref 0.7–4.0)
MCH: 28.2 pg (ref 26.0–34.0)
MCHC: 32.8 g/dL (ref 30.0–36.0)
MCV: 85.8 fL (ref 80.0–100.0)
MONO ABS: 0.4 10*3/uL (ref 0.1–1.0)
Monocytes Relative: 8 %
NRBC: 0 % (ref 0.0–0.2)
Neutro Abs: 3 10*3/uL (ref 1.7–7.7)
Neutrophils Relative %: 69 %
Platelet Count: 101 10*3/uL — ABNORMAL LOW (ref 150–400)
RBC: 4.01 MIL/uL (ref 3.87–5.11)
RDW: 15.4 % (ref 11.5–15.5)
WBC Count: 4.3 10*3/uL (ref 4.0–10.5)

## 2018-10-19 LAB — URIC ACID: Uric Acid, Serum: 4.8 mg/dL (ref 2.5–7.1)

## 2018-10-19 LAB — LACTATE DEHYDROGENASE: LDH: 152 U/L (ref 98–192)

## 2018-10-19 MED ORDER — SODIUM CHLORIDE 0.9 % IV SOLN
1000.0000 mL | INTRAVENOUS | Status: AC
  Filled 2018-10-19: qty 1000

## 2018-10-19 MED ORDER — DIPHENHYDRAMINE HCL 50 MG/ML IJ SOLN
INTRAMUSCULAR | Status: AC
  Filled 2018-10-19: qty 1

## 2018-10-19 MED ORDER — SODIUM CHLORIDE 0.9 % IV SOLN
Freq: Once | INTRAVENOUS | Status: AC
  Administered 2018-10-19: 08:00:00 via INTRAVENOUS
  Filled 2018-10-19: qty 250

## 2018-10-19 MED ORDER — DEXAMETHASONE SODIUM PHOSPHATE 10 MG/ML IJ SOLN
10.0000 mg | Freq: Once | INTRAMUSCULAR | Status: AC
  Administered 2018-10-19: 10 mg via INTRAVENOUS

## 2018-10-19 MED ORDER — DEXAMETHASONE SODIUM PHOSPHATE 10 MG/ML IJ SOLN
INTRAMUSCULAR | Status: AC
  Filled 2018-10-19: qty 1

## 2018-10-19 MED ORDER — ACETAMINOPHEN 325 MG PO TABS
ORAL_TABLET | ORAL | Status: AC
  Filled 2018-10-19: qty 2

## 2018-10-19 MED ORDER — DIPHENHYDRAMINE HCL 50 MG/ML IJ SOLN
50.0000 mg | Freq: Once | INTRAMUSCULAR | Status: AC
  Administered 2018-10-19: 50 mg via INTRAVENOUS

## 2018-10-19 MED ORDER — ACETAMINOPHEN 325 MG PO TABS
650.0000 mg | ORAL_TABLET | Freq: Once | ORAL | Status: AC
  Administered 2018-10-19: 650 mg via ORAL

## 2018-10-19 MED ORDER — SODIUM CHLORIDE 0.9 % IV SOLN
1000.0000 mg | Freq: Once | INTRAVENOUS | Status: AC
  Administered 2018-10-19: 1000 mg via INTRAVENOUS
  Filled 2018-10-19: qty 40

## 2018-10-19 MED ORDER — SODIUM CHLORIDE 0.9 % IV SOLN
1000.0000 mL | INTRAVENOUS | Status: AC
  Administered 2018-10-19: 250 mL via INTRAVENOUS
  Filled 2018-10-19: qty 1000

## 2018-10-19 NOTE — Patient Instructions (Signed)
[facility] Discharge Instructions for Patients Receiving Chemotherapy  Today you received the following chemotherapy agents Gazyva.  To help prevent nausea and vomiting after your treatment, we encourage you to take your nausea medications as prescribed.   If you develop nausea and vomiting that is not controlled by your nausea medication, call the clinic.   BELOW ARE SYMPTOMS THAT SHOULD BE REPORTED IMMEDIATELY:  *FEVER GREATER THAN 100.5 F  *CHILLS WITH OR WITHOUT FEVER  NAUSEA AND VOMITING THAT IS NOT CONTROLLED WITH YOUR NAUSEA MEDICATION  *UNUSUAL SHORTNESS OF BREATH  *UNUSUAL BRUISING OR BLEEDING  TENDERNESS IN MOUTH AND THROAT WITH OR WITHOUT PRESENCE OF ULCERS  *URINARY PROBLEMS  *BOWEL PROBLEMS  UNUSUAL RASH Items with * indicate a potential emergency and should be followed up as soon as possible.  Feel free to call the clinic should you have any questions or concerns. The clinic phone number is [phone].  Please show the [facility] at check-in to the Emergency Department and triage nurse.

## 2018-10-26 ENCOUNTER — Inpatient Hospital Stay: Payer: Medicare Other | Admitting: Hematology & Oncology

## 2018-10-26 ENCOUNTER — Inpatient Hospital Stay: Payer: Medicare Other

## 2018-11-03 ENCOUNTER — Inpatient Hospital Stay (HOSPITAL_BASED_OUTPATIENT_CLINIC_OR_DEPARTMENT_OTHER): Payer: Medicare Other | Admitting: Hematology & Oncology

## 2018-11-03 ENCOUNTER — Inpatient Hospital Stay: Payer: Medicare Other | Attending: Hematology & Oncology

## 2018-11-03 ENCOUNTER — Inpatient Hospital Stay: Payer: Medicare Other

## 2018-11-03 ENCOUNTER — Other Ambulatory Visit: Payer: Self-pay

## 2018-11-03 VITALS — BP 120/56 | HR 79 | Temp 98.3°F | Resp 18

## 2018-11-03 VITALS — BP 119/61 | HR 78 | Temp 97.6°F | Resp 20 | Wt 159.0 lb

## 2018-11-03 DIAGNOSIS — C8218 Follicular lymphoma grade II, lymph nodes of multiple sites: Secondary | ICD-10-CM

## 2018-11-03 DIAGNOSIS — Z79899 Other long term (current) drug therapy: Secondary | ICD-10-CM | POA: Diagnosis not present

## 2018-11-03 DIAGNOSIS — C8300 Small cell B-cell lymphoma, unspecified site: Secondary | ICD-10-CM

## 2018-11-03 DIAGNOSIS — Z5111 Encounter for antineoplastic chemotherapy: Secondary | ICD-10-CM | POA: Diagnosis not present

## 2018-11-03 DIAGNOSIS — E559 Vitamin D deficiency, unspecified: Secondary | ICD-10-CM

## 2018-11-03 DIAGNOSIS — D508 Other iron deficiency anemias: Secondary | ICD-10-CM

## 2018-11-03 LAB — IRON AND TIBC
Iron: 100 ug/dL (ref 41–142)
SATURATION RATIOS: 35 % (ref 21–57)
TIBC: 281 ug/dL (ref 236–444)
UIBC: 182 ug/dL (ref 120–384)

## 2018-11-03 LAB — RETICULOCYTES
IMMATURE RETIC FRACT: 1.3 % — AB (ref 2.3–15.9)
RBC.: 4.16 MIL/uL (ref 3.87–5.11)
Retic Count, Absolute: 82.8 10*3/uL (ref 19.0–186.0)
Retic Ct Pct: 2 % (ref 0.4–3.1)

## 2018-11-03 LAB — CMP (CANCER CENTER ONLY)
ALK PHOS: 101 U/L — AB (ref 26–84)
ALT: 40 U/L (ref 10–47)
ANION GAP: 4 — AB (ref 5–15)
AST: 35 U/L (ref 11–38)
Albumin: 3.3 g/dL — ABNORMAL LOW (ref 3.5–5.0)
BILIRUBIN TOTAL: 0.6 mg/dL (ref 0.2–1.6)
BUN: 12 mg/dL (ref 7–22)
CALCIUM: 9.1 mg/dL (ref 8.0–10.3)
CO2: 29 mmol/L (ref 18–33)
Chloride: 99 mmol/L (ref 98–108)
Creatinine: 0.8 mg/dL (ref 0.60–1.20)
Glucose, Bld: 113 mg/dL (ref 73–118)
Potassium: 3.4 mmol/L (ref 3.3–4.7)
Sodium: 132 mmol/L (ref 128–145)
TOTAL PROTEIN: 7 g/dL (ref 6.4–8.1)

## 2018-11-03 LAB — CBC WITH DIFFERENTIAL (CANCER CENTER ONLY)
ABS IMMATURE GRANULOCYTES: 0.02 10*3/uL (ref 0.00–0.07)
Basophils Absolute: 0.1 10*3/uL (ref 0.0–0.1)
Basophils Relative: 2 %
EOS ABS: 0.1 10*3/uL (ref 0.0–0.5)
Eosinophils Relative: 3 %
HEMATOCRIT: 37.2 % (ref 36.0–46.0)
Hemoglobin: 12.1 g/dL (ref 12.0–15.0)
Immature Granulocytes: 1 %
LYMPHS ABS: 0.7 10*3/uL (ref 0.7–4.0)
Lymphocytes Relative: 19 %
MCH: 29.1 pg (ref 26.0–34.0)
MCHC: 32.5 g/dL (ref 30.0–36.0)
MCV: 89.4 fL (ref 80.0–100.0)
MONOS PCT: 8 %
Monocytes Absolute: 0.3 10*3/uL (ref 0.1–1.0)
NEUTROS ABS: 2.6 10*3/uL (ref 1.7–7.7)
Neutrophils Relative %: 67 %
Platelet Count: 72 10*3/uL — ABNORMAL LOW (ref 150–400)
RBC: 4.16 MIL/uL (ref 3.87–5.11)
RDW: 15.9 % — ABNORMAL HIGH (ref 11.5–15.5)
WBC Count: 3.9 10*3/uL — ABNORMAL LOW (ref 4.0–10.5)
nRBC: 0 % (ref 0.0–0.2)

## 2018-11-03 LAB — LACTATE DEHYDROGENASE: LDH: 127 U/L (ref 98–192)

## 2018-11-03 LAB — FERRITIN: FERRITIN: 849 ng/mL — AB (ref 11–307)

## 2018-11-03 MED ORDER — SODIUM CHLORIDE 0.9 % IV SOLN
20.0000 mg | Freq: Once | INTRAVENOUS | Status: AC
  Administered 2018-11-03: 20 mg via INTRAVENOUS
  Filled 2018-11-03: qty 2

## 2018-11-03 MED ORDER — PALONOSETRON HCL INJECTION 0.25 MG/5ML
INTRAVENOUS | Status: AC
  Filled 2018-11-03: qty 5

## 2018-11-03 MED ORDER — SODIUM CHLORIDE 0.9 % IV SOLN
1000.0000 mg | Freq: Once | INTRAVENOUS | Status: AC
  Administered 2018-11-03: 1000 mg via INTRAVENOUS
  Filled 2018-11-03: qty 40

## 2018-11-03 MED ORDER — SODIUM CHLORIDE 0.9 % IV SOLN
Freq: Once | INTRAVENOUS | Status: AC
  Administered 2018-11-03: 09:00:00 via INTRAVENOUS
  Filled 2018-11-03: qty 250

## 2018-11-03 MED ORDER — SODIUM CHLORIDE 0.9 % IV SOLN
81.0000 mg/m2 | Freq: Once | INTRAVENOUS | Status: AC
  Administered 2018-11-03: 150 mg via INTRAVENOUS
  Filled 2018-11-03: qty 6

## 2018-11-03 MED ORDER — DIPHENHYDRAMINE HCL 50 MG/ML IJ SOLN
50.0000 mg | Freq: Once | INTRAMUSCULAR | Status: AC
  Administered 2018-11-03: 50 mg via INTRAVENOUS

## 2018-11-03 MED ORDER — SODIUM CHLORIDE 0.9% FLUSH
10.0000 mL | INTRAVENOUS | Status: DC | PRN
  Filled 2018-11-03: qty 10

## 2018-11-03 MED ORDER — ACETAMINOPHEN 325 MG PO TABS
650.0000 mg | ORAL_TABLET | Freq: Once | ORAL | Status: AC
  Administered 2018-11-03: 650 mg via ORAL

## 2018-11-03 MED ORDER — HEPARIN SOD (PORK) LOCK FLUSH 100 UNIT/ML IV SOLN
500.0000 [IU] | Freq: Once | INTRAVENOUS | Status: DC | PRN
  Filled 2018-11-03: qty 5

## 2018-11-03 MED ORDER — ACETAMINOPHEN 325 MG PO TABS
ORAL_TABLET | ORAL | Status: AC
  Filled 2018-11-03: qty 2

## 2018-11-03 MED ORDER — DIPHENHYDRAMINE HCL 50 MG/ML IJ SOLN
INTRAMUSCULAR | Status: AC
  Filled 2018-11-03: qty 1

## 2018-11-03 MED ORDER — PALONOSETRON HCL INJECTION 0.25 MG/5ML
0.2500 mg | Freq: Once | INTRAVENOUS | Status: AC
  Administered 2018-11-03: 0.25 mg via INTRAVENOUS

## 2018-11-03 NOTE — Progress Notes (Signed)
OK to treat with platelet count of 72 per Dr. Marin Olp.

## 2018-11-03 NOTE — Progress Notes (Signed)
Hematology and Oncology Follow Up Visit  Kristina Ramos [id number] [birth year] 77 y.o. 11/03/2018   Principle Diagnosis:  Follicular lymphoplasmacytic B-cell Non-Hodgkin's Lymphoma  Current Therapy:   Gazyva/Bendamustine -  S/p cycle #1    Interim History: Kristina Ramos is here today for follow-up and treatment.  So far, everything is going quite well for her.  She tolerated her first cycle of chemotherapy without difficulty.  Her weight is still down a little bit.  She does not have any hot flashes.  There is no sweats.  She is having no fevers.  She does not have any abdominal pain.  There is no abdominal fullness.  There is been no change in bowel or bladder habits.  She has had a little bit of a rash on her legs.  I am not sure if this might be a petechial type rash when her platelets were low.  She is had no mouth sores.  She has had no bleeding.  Overall, her performance status is ECOG 1.   Medications:  Allergies as of 11/03/2018      Reactions   Aspirin    REACTION: pt states that it irritates her stomach, coated ASA is ok      Medication List        Accurate as of 11/03/18  9:04 AM. Always use your most recent med list.          allopurinol 100 MG tablet Commonly known as:  ZYLOPRIM Take 1 tablet (100 mg total) by mouth daily.   amoxicillin 500 MG capsule Commonly known as:  AMOXIL Take 2,000 mg by mouth as needed. only for dental procedeure   dexamethasone 4 MG tablet Commonly known as:  DECADRON Take 2 tablets (8 mg total) by mouth daily. Start the day after chemotherapy for 2 days. Take with food.   famciclovir 250 MG tablet Commonly known as:  FAMVIR Take 1 tablet (250 mg total) by mouth daily.   levothyroxine 75 MCG tablet Commonly known as:  SYNTHROID, LEVOTHROID Take 1 tablet (75 mcg total) by mouth daily.   lisinopril-hydrochlorothiazide 10-12.5 MG tablet Commonly known as:  PRINZIDE,ZESTORETIC Take 1 tablet by mouth daily.   LORazepam 0.5  MG tablet Commonly known as:  ATIVAN Take 1 tablet (0.5 mg total) by mouth every 6 (six) hours as needed (Nausea or vomiting).   montelukast 10 MG tablet Commonly known as:  SINGULAIR Take 1 tablet (10 mg total) by mouth at bedtime.   ondansetron 8 MG tablet Commonly known as:  ZOFRAN Take 1 tablet (8 mg total) by mouth 2 (two) times daily. Start second day after chemotherapy. Then take as needed for nausea or vomiting.   prochlorperazine 10 MG tablet Commonly known as:  COMPAZINE Take 1 tablet (10 mg total) by mouth every 6 (six) hours as needed (Nausea or vomiting).   Vitamin D 50 MCG (2000 UT) Caps Take 1 capsule by mouth daily.       Allergies:  Allergies  Allergen Reactions  . Aspirin     REACTION: pt states that it irritates her stomach, coated ASA is ok    Past Medical History, Surgical history, Social history, and Family History were reviewed and updated.  Review of Systems: Review of Systems  Constitutional: Negative.   HENT: Negative.   Eyes: Negative.   Respiratory: Negative.   Cardiovascular: Negative.   Gastrointestinal: Negative.   Genitourinary: Negative.   Musculoskeletal: Negative.   Skin: Negative.   Neurological: Negative.   Endo/Heme/Allergies: Negative.  Psychiatric/Behavioral: Negative.      Physical Exam:  weight is 159 lb (72.1 kg). Her oral temperature is 97.6 F (36.4 C). Her blood pressure is 119/61 and her pulse is 78. Her respiration is 20 and oxygen saturation is 100%.   Wt Readings from Last 3 Encounters:  11/03/18 159 lb (72.1 kg)  10/19/18 159 lb (72.1 kg)  09/30/18 168 lb (76.2 kg)    Physical Exam  Constitutional: She is oriented to person, place, and time.  HENT:  Head: Normocephalic and atraumatic.  Mouth/Throat: Oropharynx is clear and moist.  Eyes: Pupils are equal, round, and reactive to light. EOM are normal.  Neck: Normal range of motion.  Cardiovascular: Normal rate, regular rhythm and normal heart sounds.    Pulmonary/Chest: Effort normal and breath sounds normal.  Abdominal: Soft. Bowel sounds are normal.  Abdominal exam shows a soft abdomen.  Bowel sounds are present.  There is no fluid wave.  There is no inguinal adenopathy.  There is no hepatomegaly.  Her spleen has shrunk down quite nicely.  Her spleen is now about 4 cm below the left costal margin.  Musculoskeletal: Normal range of motion. She exhibits no edema, tenderness or deformity.  Lymphadenopathy:    She has no cervical adenopathy.  Neurological: She is alert and oriented to person, place, and time.  Skin: Skin is warm and dry. No rash noted. No erythema.  Psychiatric: She has a normal mood and affect. Her behavior is normal. Judgment and thought content normal.  Vitals reviewed.    Lab Results  Component Value Date   WBC 3.9 (L) 11/03/2018   HGB 12.1 11/03/2018   HCT 37.2 11/03/2018   MCV 89.4 11/03/2018   PLT 72 (L) 11/03/2018   Lab Results  Component Value Date   FERRITIN 814 (H) 10/12/2018   IRON 88 10/12/2018   TIBC 254 10/12/2018   UIBC 166 10/12/2018   IRONPCTSAT 35 10/12/2018   Lab Results  Component Value Date   RETICCTPCT 2.0 11/03/2018   RBC 4.16 11/03/2018   RBC 4.16 11/03/2018   RETICCTABS 105,600 (H) 03/03/2018   Lab Results  Component Value Date   KPAFRELGTCHN 330.4 (H) 09/30/2018   LAMBDASER 5.9 09/30/2018   KAPLAMBRATIO 56.00 (H) 09/30/2018   Lab Results  Component Value Date   IGGSERUM 339 (L) 09/30/2018   IGA 61 (L) 09/30/2018   IGMSERUM >5,850 (H) 09/30/2018   Lab Results  Component Value Date   TOTALPROTELP 8.6 (H) 09/30/2018     Chemistry      Component Value Date/Time   NA 132 11/03/2018 0809   K 3.4 11/03/2018 0809   CL 99 11/03/2018 0809   CO2 29 11/03/2018 0809   BUN 12 11/03/2018 0809   CREATININE 0.80 11/03/2018 0809      Component Value Date/Time   CALCIUM 9.1 11/03/2018 0809   ALKPHOS 101 (H) 11/03/2018 0809   AST 35 11/03/2018 0809   ALT 40 11/03/2018 0809    BILITOT 0.6 11/03/2018 0809       Impression and Plan: Kristina Ramos is a very pleasant 77 yo caucasian female with 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  Clinically, she is done well.  She is responding.  We will have to see what her labs look like.  Her platelet count is down a little bit.  However, I am not too worried about this.  We will go ahead with treatment.  I think this would be reasonable for her.  After this cycle,  we will proceed with her next CT scan.  I would like to think that we will see a nice response with her spleen being decreased in size significantly.  I will plan to see her back in 1 month.    Volanda Napoleon, MD 11/13/20199:04 AM

## 2018-11-03 NOTE — Patient Instructions (Signed)
[facility] Discharge Instructions for Patients Receiving Chemotherapy  Today you received the following chemotherapy agents:  Gazyva and Bendamustine  To help prevent nausea and vomiting after your treatment, we encourage you to take your nausea medication as ordered per MD.    If you develop nausea and vomiting that is not controlled by your nausea medication, call the clinic.   BELOW ARE SYMPTOMS THAT SHOULD BE REPORTED IMMEDIATELY:  *FEVER GREATER THAN 100.5 F  *CHILLS WITH OR WITHOUT FEVER  NAUSEA AND VOMITING THAT IS NOT CONTROLLED WITH YOUR NAUSEA MEDICATION  *UNUSUAL SHORTNESS OF BREATH  *UNUSUAL BRUISING OR BLEEDING  TENDERNESS IN MOUTH AND THROAT WITH OR WITHOUT PRESENCE OF ULCERS  *URINARY PROBLEMS  *BOWEL PROBLEMS  UNUSUAL RASH Items with * indicate a potential emergency and should be followed up as soon as possible.  Feel free to call the clinic should you have any questions or concerns. The clinic phone number is [phone].  Please show the [facility] at check-in to the Emergency Department and triage nurse.

## 2018-11-04 ENCOUNTER — Inpatient Hospital Stay: Payer: Medicare Other

## 2018-11-04 VITALS — BP 111/49 | HR 72 | Temp 97.7°F | Resp 20

## 2018-11-04 DIAGNOSIS — Z79899 Other long term (current) drug therapy: Secondary | ICD-10-CM | POA: Diagnosis not present

## 2018-11-04 DIAGNOSIS — C8218 Follicular lymphoma grade II, lymph nodes of multiple sites: Secondary | ICD-10-CM

## 2018-11-04 DIAGNOSIS — Z5111 Encounter for antineoplastic chemotherapy: Secondary | ICD-10-CM | POA: Diagnosis not present

## 2018-11-04 LAB — PROTEIN ELECTROPHORESIS, SERUM
A/G Ratio: 1 (ref 0.7–1.7)
ALBUMIN ELP: 3.5 g/dL (ref 2.9–4.4)
ALPHA-1-GLOBULIN: 0.3 g/dL (ref 0.0–0.4)
ALPHA-2-GLOBULIN: 0.6 g/dL (ref 0.4–1.0)
Beta Globulin: 1.2 g/dL (ref 0.7–1.3)
GLOBULIN, TOTAL: 3.5 g/dL (ref 2.2–3.9)
Gamma Globulin: 1.4 g/dL (ref 0.4–1.8)
M-SPIKE, %: 1 g/dL — AB
TOTAL PROTEIN ELP: 7 g/dL (ref 6.0–8.5)

## 2018-11-04 LAB — BETA 2 MICROGLOBULIN, SERUM: Beta-2 Microglobulin: 4 mg/L — ABNORMAL HIGH (ref 0.6–2.4)

## 2018-11-04 LAB — IGG, IGA, IGM
IGA: 58 mg/dL — AB (ref 64–422)
IGM (IMMUNOGLOBULIN M), SRM: 2563 mg/dL — AB (ref 26–217)
IgG (Immunoglobin G), Serum: 384 mg/dL — ABNORMAL LOW (ref 700–1600)

## 2018-11-04 LAB — KAPPA/LAMBDA LIGHT CHAINS
KAPPA FREE LGHT CHN: 40 mg/L — AB (ref 3.3–19.4)
Kappa, lambda light chain ratio: 5.97 — ABNORMAL HIGH (ref 0.26–1.65)
LAMDA FREE LIGHT CHAINS: 6.7 mg/L (ref 5.7–26.3)

## 2018-11-04 LAB — VITAMIN D 25 HYDROXY (VIT D DEFICIENCY, FRACTURES): Vit D, 25-Hydroxy: 34 ng/mL (ref 30.0–100.0)

## 2018-11-04 MED ORDER — SODIUM CHLORIDE 0.9 % IV SOLN
81.0000 mg/m2 | Freq: Once | INTRAVENOUS | Status: AC
  Administered 2018-11-04: 150 mg via INTRAVENOUS
  Filled 2018-11-04: qty 6

## 2018-11-04 MED ORDER — DEXAMETHASONE SODIUM PHOSPHATE 10 MG/ML IJ SOLN
10.0000 mg | Freq: Once | INTRAMUSCULAR | Status: AC
  Administered 2018-11-04: 10 mg via INTRAVENOUS

## 2018-11-04 MED ORDER — DEXAMETHASONE SODIUM PHOSPHATE 10 MG/ML IJ SOLN
INTRAMUSCULAR | Status: AC
  Filled 2018-11-04: qty 1

## 2018-11-04 MED ORDER — SODIUM CHLORIDE 0.9 % IV SOLN
Freq: Once | INTRAVENOUS | Status: AC
  Administered 2018-11-04: 10:00:00 via INTRAVENOUS
  Filled 2018-11-04: qty 250

## 2018-11-06 ENCOUNTER — Other Ambulatory Visit: Payer: Self-pay | Admitting: Hematology & Oncology

## 2018-11-06 ENCOUNTER — Other Ambulatory Visit: Payer: Self-pay | Admitting: Family Medicine

## 2018-11-10 ENCOUNTER — Other Ambulatory Visit: Payer: Self-pay | Admitting: Hematology & Oncology

## 2018-11-26 ENCOUNTER — Ambulatory Visit (HOSPITAL_BASED_OUTPATIENT_CLINIC_OR_DEPARTMENT_OTHER)
Admission: RE | Admit: 2018-11-26 | Discharge: 2018-11-26 | Disposition: A | Discharge status: Home / Self Care | Payer: Medicare Other | Source: Ambulatory Visit | Attending: Hematology & Oncology | Admitting: Hematology & Oncology

## 2018-11-26 ENCOUNTER — Encounter (HOSPITAL_BASED_OUTPATIENT_CLINIC_OR_DEPARTMENT_OTHER): Payer: Self-pay

## 2018-11-26 DIAGNOSIS — C8300 Small cell B-cell lymphoma, unspecified site: Secondary | ICD-10-CM

## 2018-11-26 DIAGNOSIS — C8290 Follicular lymphoma, unspecified, unspecified site: Secondary | ICD-10-CM | POA: Diagnosis not present

## 2018-11-26 MED ORDER — IOPAMIDOL (ISOVUE-300) INJECTION 61%
100.0000 mL | Freq: Once | INTRAVENOUS | Status: AC | PRN
  Administered 2018-11-26: 100 mL via INTRAVENOUS

## 2018-11-29 ENCOUNTER — Encounter: Payer: Self-pay | Admitting: Internal Medicine

## 2018-11-29 ENCOUNTER — Telehealth: Payer: Self-pay | Admitting: *Deleted

## 2018-11-29 NOTE — Telephone Encounter (Signed)
-----   Message from Kristina Napoleon, MD sent at 11/26/2018  5:21 PM EST ----- Call - the lymphoma is responding nicely!!  The lymph nodes are much smaller and the spleen is much smaller!!  [place] job!! [name]

## 2018-11-29 NOTE — Telephone Encounter (Signed)
As noted below by Dr. Marin Olp, I informed the patient that the lymphoma is responding to the treatment. Her lymph nodes and spleen are smaller. She verbalized understanding.

## 2018-12-01 ENCOUNTER — Inpatient Hospital Stay: Payer: Medicare Other | Attending: Hematology & Oncology | Admitting: Hematology & Oncology

## 2018-12-01 ENCOUNTER — Inpatient Hospital Stay: Payer: Medicare Other

## 2018-12-01 ENCOUNTER — Other Ambulatory Visit: Payer: Self-pay

## 2018-12-01 ENCOUNTER — Encounter: Payer: Self-pay | Admitting: Hematology & Oncology

## 2018-12-01 DIAGNOSIS — Z79899 Other long term (current) drug therapy: Secondary | ICD-10-CM

## 2018-12-01 DIAGNOSIS — R161 Splenomegaly, not elsewhere classified: Secondary | ICD-10-CM

## 2018-12-01 DIAGNOSIS — C8218 Follicular lymphoma grade II, lymph nodes of multiple sites: Secondary | ICD-10-CM

## 2018-12-01 DIAGNOSIS — Z5111 Encounter for antineoplastic chemotherapy: Secondary | ICD-10-CM | POA: Insufficient documentation

## 2018-12-01 DIAGNOSIS — E559 Vitamin D deficiency, unspecified: Secondary | ICD-10-CM

## 2018-12-01 DIAGNOSIS — E538 Deficiency of other specified B group vitamins: Secondary | ICD-10-CM

## 2018-12-01 DIAGNOSIS — C8300 Small cell B-cell lymphoma, unspecified site: Secondary | ICD-10-CM

## 2018-12-01 DIAGNOSIS — R7989 Other specified abnormal findings of blood chemistry: Secondary | ICD-10-CM

## 2018-12-01 DIAGNOSIS — D500 Iron deficiency anemia secondary to blood loss (chronic): Secondary | ICD-10-CM

## 2018-12-01 LAB — CBC WITH DIFFERENTIAL (CANCER CENTER ONLY)
Abs Immature Granulocytes: 0.03 10*3/uL (ref 0.00–0.07)
Basophils Absolute: 0 10*3/uL (ref 0.0–0.1)
Basophils Relative: 1 %
Eosinophils Absolute: 0.1 10*3/uL (ref 0.0–0.5)
Eosinophils Relative: 3 %
HCT: 34.5 % — ABNORMAL LOW (ref 36.0–46.0)
Hemoglobin: 11.5 g/dL — ABNORMAL LOW (ref 12.0–15.0)
IMMATURE GRANULOCYTES: 1 %
Lymphocytes Relative: 35 %
Lymphs Abs: 1.4 10*3/uL (ref 0.7–4.0)
MCH: 29.9 pg (ref 26.0–34.0)
MCHC: 33.3 g/dL (ref 30.0–36.0)
MCV: 89.6 fL (ref 80.0–100.0)
Monocytes Absolute: 0.4 10*3/uL (ref 0.1–1.0)
Monocytes Relative: 11 %
Neutro Abs: 2 10*3/uL (ref 1.7–7.7)
Neutrophils Relative %: 49 %
Platelet Count: 119 10*3/uL — ABNORMAL LOW (ref 150–400)
RBC: 3.85 MIL/uL — ABNORMAL LOW (ref 3.87–5.11)
RDW: 16 % — ABNORMAL HIGH (ref 11.5–15.5)
WBC Count: 3.9 10*3/uL — ABNORMAL LOW (ref 4.0–10.5)
nRBC: 0 % (ref 0.0–0.2)

## 2018-12-01 LAB — CMP (CANCER CENTER ONLY)
ALT: 34 U/L (ref 0–44)
AST: 29 U/L (ref 15–41)
Albumin: 3.9 g/dL (ref 3.5–5.0)
Alkaline Phosphatase: 143 U/L — ABNORMAL HIGH (ref 38–126)
Anion gap: 6 (ref 5–15)
BUN: 12 mg/dL (ref 8–23)
CO2: 29 mmol/L (ref 22–32)
Calcium: 9.3 mg/dL (ref 8.9–10.3)
Chloride: 94 mmol/L — ABNORMAL LOW (ref 98–111)
Creatinine: 0.61 mg/dL (ref 0.44–1.00)
GFR, Est AFR Am: >60 mL/min (ref >60)
GFR, Estimated: >60 mL/min (ref >60)
Glucose, Bld: 101 mg/dL — ABNORMAL HIGH (ref 70–99)
Potassium: 4 mmol/L (ref 3.5–5.1)
Sodium: 129 mmol/L — ABNORMAL LOW (ref 135–145)
Total Bilirubin: 0.5 mg/dL (ref 0.3–1.2)
Total Protein: 6.4 g/dL — ABNORMAL LOW (ref 6.5–8.1)

## 2018-12-01 LAB — LACTATE DEHYDROGENASE: LDH: 148 U/L (ref 98–192)

## 2018-12-01 MED ORDER — SODIUM CHLORIDE 0.9 % IV SOLN
Freq: Once | INTRAVENOUS | Status: AC
  Administered 2018-12-01: 10:00:00 via INTRAVENOUS
  Filled 2018-12-01: qty 250

## 2018-12-01 MED ORDER — CYANOCOBALAMIN 1000 MCG/ML IJ SOLN
INTRAMUSCULAR | Status: AC
  Filled 2018-12-01: qty 1

## 2018-12-01 MED ORDER — SODIUM CHLORIDE 0.9 % IV SOLN
81.0000 mg/m2 | Freq: Once | INTRAVENOUS | Status: AC
  Administered 2018-12-01: 150 mg via INTRAVENOUS
  Filled 2018-12-01: qty 6

## 2018-12-01 MED ORDER — SODIUM CHLORIDE 0.9 % IV SOLN
20.0000 mg | Freq: Once | INTRAVENOUS | Status: AC
  Administered 2018-12-01: 20 mg via INTRAVENOUS
  Filled 2018-12-01: qty 2

## 2018-12-01 MED ORDER — PALONOSETRON HCL INJECTION 0.25 MG/5ML
0.2500 mg | Freq: Once | INTRAVENOUS | Status: AC
  Administered 2018-12-01: 0.25 mg via INTRAVENOUS

## 2018-12-01 MED ORDER — DIPHENHYDRAMINE HCL 50 MG/ML IJ SOLN
INTRAMUSCULAR | Status: AC
  Filled 2018-12-01: qty 1

## 2018-12-01 MED ORDER — DIPHENHYDRAMINE HCL 25 MG PO CAPS
ORAL_CAPSULE | ORAL | Status: AC
  Filled 2018-12-01: qty 2

## 2018-12-01 MED ORDER — ACETAMINOPHEN 325 MG PO TABS
ORAL_TABLET | ORAL | Status: AC
  Filled 2018-12-01: qty 2

## 2018-12-01 MED ORDER — CYANOCOBALAMIN 1000 MCG/ML IJ SOLN: 1000.0000 ug | Freq: Once | INTRAMUSCULAR | Status: DC

## 2018-12-01 MED ORDER — ACETAMINOPHEN 325 MG PO TABS
650.0000 mg | ORAL_TABLET | Freq: Once | ORAL | Status: AC
  Administered 2018-12-01: 650 mg via ORAL

## 2018-12-01 MED ORDER — SODIUM CHLORIDE 0.9 % IV SOLN
1000.0000 mg | Freq: Once | INTRAVENOUS | Status: AC
  Administered 2018-12-01: 1000 mg via INTRAVENOUS
  Filled 2018-12-01: qty 40

## 2018-12-01 MED ORDER — DIPHENHYDRAMINE HCL 50 MG/ML IJ SOLN
50.0000 mg | Freq: Once | INTRAMUSCULAR | Status: AC
  Administered 2018-12-01: 50 mg via INTRAVENOUS

## 2018-12-01 MED ORDER — PALONOSETRON HCL INJECTION 0.25 MG/5ML
INTRAVENOUS | Status: AC
  Filled 2018-12-01: qty 5

## 2018-12-01 MED ORDER — CYANOCOBALAMIN 1000 MCG/ML IJ SOLN
1000.0000 ug | Freq: Once | INTRAMUSCULAR | Status: AC
  Administered 2018-12-01: 1000 ug via INTRAMUSCULAR

## 2018-12-01 NOTE — Progress Notes (Signed)
Hematology and Oncology Follow Up Visit  Kristina Ramos [id number] 11/27/1941 77 y.o. 12/01/2018   Principle Diagnosis:  Follicular lymphoplasmacytic B-cell Non-Hodgkin's Lymphoma  Current Therapy:   Gazyva/Bendamustine -  S/p cycle #2    Interim History: Kristina Ramos is here today for follow-up and treatment.  She, as expected, has responded very well to treatment.  We did a follow-up CT scan on her.  The CT scan showed resolution of a lot of her adenopathy.  Her spleen was down to 19 cm in size.  We first started treatment, her spleen was 30 cm in size.  She is feeling well.  She had a wonderful Thanksgiving.  She is able to cook for Thanksgiving.  This made her quite happy.  She is not having fevers.  She is not losing weight.  She is gained 3 pounds since we last saw her.  She is had no rashes.  She has had no change in bowel or bladder habits.  She has had no cough.  She has not felt any swollen lymph nodes.  Overall, her performance status is ECOG 1.   Medications:  Allergies as of 12/01/2018      Reactions   Aspirin    REACTION: pt states that it irritates her stomach, coated ASA is ok      Medication List        Accurate as of 12/01/18  8:47 AM. Always use your most recent med list.          allopurinol 100 MG tablet Commonly known as:  ZYLOPRIM TAKE 1 TABLET BY MOUTH EVERY DAY   amoxicillin 500 MG capsule Commonly known as:  AMOXIL Take 2,000 mg by mouth as needed. only for dental procedeure   dexamethasone 4 MG tablet Commonly known as:  DECADRON Take 2 tablets (8 mg total) by mouth daily. Start the day after chemotherapy for 2 days. Take with food.   famciclovir 250 MG tablet Commonly known as:  FAMVIR Take 1 tablet (250 mg total) by mouth daily.   levothyroxine 75 MCG tablet Commonly known as:  SYNTHROID, LEVOTHROID TAKE 1 TABLET BY MOUTH EVERY DAY   lisinopril-hydrochlorothiazide 10-12.5 MG tablet Commonly known as:  PRINZIDE,ZESTORETIC Take 1  tablet by mouth daily.   LORazepam 0.5 MG tablet Commonly known as:  ATIVAN Take 1 tablet (0.5 mg total) by mouth every 6 (six) hours as needed (Nausea or vomiting).   montelukast 10 MG tablet Commonly known as:  SINGULAIR Take 1 tablet (10 mg total) by mouth at bedtime.   ondansetron 8 MG tablet Commonly known as:  ZOFRAN Take 1 tablet (8 mg total) by mouth 2 (two) times daily. Start second day after chemotherapy. Then take as needed for nausea or vomiting.   prochlorperazine 10 MG tablet Commonly known as:  COMPAZINE Take 1 tablet (10 mg total) by mouth every 6 (six) hours as needed (Nausea or vomiting).   Vitamin D 50 MCG (2000 UT) Caps Take 1 capsule by mouth daily.       Allergies:  Allergies  Allergen Reactions  . Aspirin     REACTION: pt states that it irritates her stomach, coated ASA is ok    Past Medical History, Surgical history, Social history, and Family History were reviewed and updated.  Review of Systems: Review of Systems  Constitutional: Negative.   HENT: Negative.   Eyes: Negative.   Respiratory: Negative.   Cardiovascular: Negative.   Gastrointestinal: Negative.   Genitourinary: Negative.   Musculoskeletal: Negative.  Skin: Negative.   Neurological: Negative.   Endo/Heme/Allergies: Negative.   Psychiatric/Behavioral: Negative.      Physical Exam:  vitals were not taken for this visit.   Wt Readings from Last 3 Encounters:  11/03/18 159 lb (72.1 kg)  10/19/18 159 lb (72.1 kg)  09/30/18 168 lb (76.2 kg)    Physical Exam  Constitutional: She is oriented to person, place, and time.  HENT:  Head: Normocephalic and atraumatic.  Mouth/Throat: Oropharynx is clear and moist.  Eyes: Pupils are equal, round, and reactive to light. EOM are normal.  Neck: Normal range of motion.  Cardiovascular: Normal rate, regular rhythm and normal heart sounds.  Pulmonary/Chest: Effort normal and breath sounds normal.  Abdominal: Soft. Bowel sounds are  normal.  Abdominal exam shows a soft abdomen.  Bowel sounds are present.  There is no fluid wave.  There is no inguinal adenopathy.  There is no hepatomegaly.  Her spleen has shrunk down quite nicely.  Her spleen is now about 4 cm below the left costal margin.  Musculoskeletal: Normal range of motion. She exhibits no edema, tenderness or deformity.  Lymphadenopathy:    She has no cervical adenopathy.  Neurological: She is alert and oriented to person, place, and time.  Skin: Skin is warm and dry. No rash noted. No erythema.  Psychiatric: She has a normal mood and affect. Her behavior is normal. Judgment and thought content normal.  Vitals reviewed.    Lab Results  Component Value Date   WBC 3.9 (L) 12/01/2018   HGB 11.5 (L) 12/01/2018   HCT 34.5 (L) 12/01/2018   MCV 89.6 12/01/2018   PLT 119 (L) 12/01/2018   Lab Results  Component Value Date   FERRITIN 849 (H) 11/03/2018   IRON 100 11/03/2018   TIBC 281 11/03/2018   UIBC 182 11/03/2018   IRONPCTSAT 35 11/03/2018   Lab Results  Component Value Date   RETICCTPCT 2.0 11/03/2018   RBC 3.85 (L) 12/01/2018   RETICCTABS 105,600 (H) 03/03/2018   Lab Results  Component Value Date   KPAFRELGTCHN 40.0 (H) 11/03/2018   LAMBDASER 6.7 11/03/2018   KAPLAMBRATIO 5.97 (H) 11/03/2018   Lab Results  Component Value Date   IGGSERUM 384 (L) 11/03/2018   IGA 58 (L) 11/03/2018   IGMSERUM 2,563 (H) 11/03/2018   Lab Results  Component Value Date   TOTALPROTELP 7.0 11/03/2018   ALBUMINELP 3.5 11/03/2018   A1GS 0.3 11/03/2018   A2GS 0.6 11/03/2018   BETS 1.2 11/03/2018   GAMS 1.4 11/03/2018   MSPIKE 1.0 (H) 11/03/2018   SPEI Comment 11/03/2018     Chemistry      Component Value Date/Time   NA 129 (L) 12/01/2018 0757   K 4.0 12/01/2018 0757   CL 94 (L) 12/01/2018 0757   CO2 29 12/01/2018 0757   BUN 12 12/01/2018 0757   CREATININE 0.61 12/01/2018 0757      Component Value Date/Time   CALCIUM 9.3 12/01/2018 0757   ALKPHOS 143  (H) 12/01/2018 0757   AST 29 12/01/2018 0757   ALT 34 12/01/2018 0757   BILITOT 0.5 12/01/2018 0757       Impression and Plan: Kristina Ramos is a very pleasant 77 yo caucasian female with 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  Clinically, she is done well.  She is responding.  As expected, she is responding well.  We will go with cycle #3 of treatment today.  I still plan for 6 cycles of treatment.  She clearly  will need maintenance therapy with Gazyva.  We will get her back in 1 more month for her fourth cycle of treatment.  I am so happy that she is ending the year feeling so well and she is excited about the upcoming Christmas and New Year's holiday.  Volanda Napoleon, MD 12/11/20198:47 AM

## 2018-12-01 NOTE — Addendum Note (Signed)
Addended by: Volanda Napoleon on: 12/01/2018 09:39 AM   Modules accepted: Orders

## 2018-12-02 ENCOUNTER — Inpatient Hospital Stay: Payer: Medicare Other

## 2018-12-02 VITALS — BP 114/59 | HR 73 | Temp 97.5°F | Resp 18

## 2018-12-02 DIAGNOSIS — Z5111 Encounter for antineoplastic chemotherapy: Secondary | ICD-10-CM | POA: Diagnosis not present

## 2018-12-02 DIAGNOSIS — Z79899 Other long term (current) drug therapy: Secondary | ICD-10-CM | POA: Diagnosis not present

## 2018-12-02 DIAGNOSIS — C8218 Follicular lymphoma grade II, lymph nodes of multiple sites: Secondary | ICD-10-CM | POA: Diagnosis not present

## 2018-12-02 DIAGNOSIS — R161 Splenomegaly, not elsewhere classified: Secondary | ICD-10-CM | POA: Diagnosis not present

## 2018-12-02 LAB — IGG, IGA, IGM
IgA: 50 mg/dL — ABNORMAL LOW (ref 64–422)
IgG (Immunoglobin G), Serum: 358 mg/dL — ABNORMAL LOW (ref 700–1600)
IgM (Immunoglobulin M), Srm: 1970 mg/dL — ABNORMAL HIGH (ref 26–217)

## 2018-12-02 LAB — KAPPA/LAMBDA LIGHT CHAINS
Kappa free light chain: 74.9 mg/L — ABNORMAL HIGH (ref 3.3–19.4)
Kappa, lambda light chain ratio: 20.24 — ABNORMAL HIGH (ref 0.26–1.65)
LAMDA FREE LIGHT CHAINS: 3.7 mg/L — AB (ref 5.7–26.3)

## 2018-12-02 LAB — BETA 2 MICROGLOBULIN, SERUM: Beta-2 Microglobulin: 3.4 mg/L — ABNORMAL HIGH (ref 0.6–2.4)

## 2018-12-02 LAB — VITAMIN D 25 HYDROXY (VIT D DEFICIENCY, FRACTURES): VIT D 25 HYDROXY: 32.6 ng/mL (ref 30.0–100.0)

## 2018-12-02 MED ORDER — DEXAMETHASONE SODIUM PHOSPHATE 10 MG/ML IJ SOLN
INTRAMUSCULAR | Status: AC
  Filled 2018-12-02: qty 1

## 2018-12-02 MED ORDER — SODIUM CHLORIDE 0.9 % IV SOLN
81.0000 mg/m2 | Freq: Once | INTRAVENOUS | Status: AC
  Administered 2018-12-02: 150 mg via INTRAVENOUS
  Filled 2018-12-02: qty 6

## 2018-12-02 MED ORDER — SODIUM CHLORIDE 0.9 % IV SOLN
Freq: Once | INTRAVENOUS | Status: AC
  Administered 2018-12-02: 09:00:00 via INTRAVENOUS
  Filled 2018-12-02: qty 250

## 2018-12-02 MED ORDER — DEXAMETHASONE SODIUM PHOSPHATE 10 MG/ML IJ SOLN
10.0000 mg | Freq: Once | INTRAMUSCULAR | Status: AC
  Administered 2018-12-02: 10 mg via INTRAVENOUS

## 2018-12-02 NOTE — Patient Instructions (Addendum)
Bendamustine Injection What is this medicine? BENDAMUSTINE (BEN da MUS teen) is a chemotherapy drug. It is used to treat chronic lymphocytic leukemia and non-Hodgkin lymphoma. This medicine may be used for other purposes; ask your health care provider or pharmacist if you have questions. COMMON BRAND NAME(S): BENDEKA, Treanda What should I tell my health care provider before I take this medicine? They need to know if you have any of these conditions: -infection (especially a virus infection such as chickenpox, cold sores, or herpes) -kidney disease -liver disease -an unusual or allergic reaction to bendamustine, mannitol, other medicines, foods, dyes, or preservatives -pregnant or trying to get pregnant -breast-feeding How should I use this medicine? This medicine is for infusion into a vein. It is given by a health care professional in a hospital or clinic setting. Talk to your pediatrician regarding the use of this medicine in children. Special care may be needed. Overdosage: If you think you have taken too much of this medicine contact a poison control center or emergency room at once. NOTE: This medicine is only for you. Do not share this medicine with others. What if I miss a dose? It is important not to miss your dose. Call your doctor or health care professional if you are unable to keep an appointment. What may interact with this medicine? Do not take this medicine with any of the following medications: -clozapine This medicine may also interact with the following medications: -atazanavir -cimetidine -ciprofloxacin -enoxacin -fluvoxamine -medicines for seizures like carbamazepine and phenobarbital -mexiletine -rifampin -tacrine -thiabendazole -zileuton This list may not describe all possible interactions. Give your health care provider a list of all the medicines, herbs, non-prescription drugs, or dietary supplements you use. Also tell them if you smoke, drink alcohol, or  use illegal drugs. Some items may interact with your medicine. What should I watch for while using this medicine? This drug may make you feel generally unwell. This is not uncommon, as chemotherapy can affect healthy cells as well as cancer cells. Report any side effects. Continue your course of treatment even though you feel ill unless your doctor tells you to stop. You may need blood work done while you are taking this medicine. Call your doctor or health care professional for advice if you get a fever, chills or sore throat, or other symptoms of a cold or flu. Do not treat yourself. This drug decreases your body's ability to fight infections. Try to avoid being around people who are sick. This medicine may increase your risk to bruise or bleed. Call your doctor or health care professional if you notice any unusual bleeding. Talk to your doctor about your risk of cancer. You may be more at risk for certain types of cancers if you take this medicine. Do not become pregnant while taking this medicine or for 3 months after stopping it. Women should inform their doctor if they wish to become pregnant or think they might be pregnant. Men should not father a child while taking this medicine and for 3 months after stopping it.There is a potential for serious side effects to an unborn child. Talk to your health care professional or pharmacist for more information. Do not breast-feed an infant while taking this medicine. This medicine may interfere with the ability to have a child. You should talk with your doctor or health care professional if you are concerned about your fertility. What side effects may I notice from receiving this medicine? Side effects that you should report to your doctor  or health care professional as soon as possible: -allergic reactions like skin rash, itching or hives, swelling of the face, lips, or tongue -low blood counts - this medicine may decrease the number of white blood cells,  red blood cells and platelets. You may be at increased risk for infections and bleeding. -redness, blistering, peeling or loosening of the skin, including inside the mouth -signs of infection - fever or chills, cough, sore throat, pain or difficulty passing urine -signs of decreased platelets or bleeding - bruising, pinpoint red spots on the skin, black, tarry stools, blood in the urine -signs of decreased red blood cells - unusually weak or tired, fainting spells, lightheadedness -signs and symptoms of kidney injury like trouble passing urine or change in the amount of urine -signs and symptoms of liver injury like dark yellow or brown urine; general ill feeling or flu-like symptoms; light-colored stools; loss of appetite; nausea; right upper belly pain; unusually weak or tired; yellowing of the eyes or skin Side effects that usually do not require medical attention (report to your doctor or health care professional if they continue or are bothersome): -constipation -decreased appetite -diarrhea -headache -mouth sores -nausea/vomiting -tiredness This list may not describe all possible side effects. Call your doctor for medical advice about side effects. You may report side effects to FDA at 1-800-FDA-1088. Where should I keep my medicine? This drug is given in a hospital or clinic and will not be stored at home. NOTE: This sheet is a summary. It may not cover all possible information. If you have questions about this medicine, talk to your doctor, pharmacist, or health care provider.  2018 Elsevier/Gold Standard (2015-10-11 08:45:41) Dexamethasone injection What is this medicine? DEXAMETHASONE (dex a METH a sone) is a corticosteroid. It is used to treat inflammation of the skin, joints, lungs, and other organs. Common conditions treated include asthma, allergies, and arthritis. It is also used for other conditions, like blood disorders and diseases of the adrenal glands. This medicine may be  used for other purposes; ask your health care provider or pharmacist if you have questions. COMMON BRAND NAME(S): Decadron, DoubleDex, Simplist Dexamethasone, Solurex What should I tell my health care provider before I take this medicine? They need to know if you have any of these conditions: -blood clotting problems -Cushing's syndrome -diabetes -glaucoma -heart problems or disease -high blood pressure -infection like herpes, measles, tuberculosis, or chickenpox -kidney disease -liver disease -mental problems -myasthenia gravis -osteoporosis -previous heart attack -seizures -stomach, ulcer or intestine disease including colitis and diverticulitis -thyroid problem -an unusual or allergic reaction to dexamethasone, corticosteroids, other medicines, lactose, foods, dyes, or preservatives -pregnant or trying to get pregnant -breast-feeding How should I use this medicine? This medicine is for injection into a muscle, joint, lesion, soft tissue, or vein. It is given by a health care professional in a hospital or clinic setting. Talk to your pediatrician regarding the use of this medicine in children. Special care may be needed. Overdosage: If you think you have taken too much of this medicine contact a poison control center or emergency room at once. NOTE: This medicine is only for you. Do not share this medicine with others. What if I miss a dose? This may not apply. If you are having a series of injections over a prolonged period, try not to miss an appointment. Call your doctor or health care professional to reschedule if you are unable to keep an appointment. What may interact with this medicine? Do not take this  medicine with any of the following medications: -mifepristone, RU-486 -vaccines This medicine may also interact with the following medications: -amphotericin B -antibiotics like clarithromycin, erythromycin, and troleandomycin -aspirin and aspirin-like  drugs -barbiturates like phenobarbital -carbamazepine -cholestyramine -cholinesterase inhibitors like donepezil, galantamine, rivastigmine, and tacrine -cyclosporine -digoxin -diuretics -ephedrine -female hormones, like estrogens or progestins and birth control pills -indinavir -isoniazid -ketoconazole -medicines for diabetes -medicines that improve muscle tone or strength for conditions like myasthenia gravis -NSAIDs, medicines for pain and inflammation, like ibuprofen or naproxen -phenytoin -rifampin -thalidomide -warfarin This list may not describe all possible interactions. Give your health care provider a list of all the medicines, herbs, non-prescription drugs, or dietary supplements you use. Also tell them if you smoke, drink alcohol, or use illegal drugs. Some items may interact with your medicine. What should I watch for while using this medicine? Your condition will be monitored carefully while you are receiving this medicine. If you are taking this medicine for a long time, carry an identification card with your name and address, the type and dose of your medicine, and your doctor's name and address. This medicine may increase your risk of getting an infection. Stay away from people who are sick. Tell your doctor or health care professional if you are around anyone with measles or chickenpox. Talk to your health care provider before you get any vaccines that you take this medicine. If you are going to have surgery, tell your doctor or health care professional that you have taken this medicine within the last twelve months. Ask your doctor or health care professional about your diet. You may need to lower the amount of salt you eat. The medicine can increase your blood sugar. If you are a diabetic check with your doctor if you need help adjusting the dose of your diabetic medicine. What side effects may I notice from receiving this medicine? Side effects that you should report  to your doctor or health care professional as soon as possible: -allergic reactions like skin rash, itching or hives, swelling of the face, lips, or tongue -black or tarry stools -change in the amount of urine -changes in vision -confusion, excitement, restlessness, a false sense of well-being -fever, sore throat, sneezing, cough, or other signs of infection, wounds that will not heal -hallucinations -increased thirst -mental depression, mood swings, mistaken feelings of self importance or of being mistreated -pain in hips, back, ribs, arms, shoulders, or legs -pain, redness, or irritation at the injection site -redness, blistering, peeling or loosening of the skin, including inside the mouth -rounding out of face -swelling of feet or lower legs -unusual bleeding or bruising -unusual tired or weak -wounds that do not heal Side effects that usually do not require medical attention (report to your doctor or health care professional if they continue or are bothersome): -diarrhea or constipation -change in taste -headache -nausea, vomiting -skin problems, acne, thin and shiny skin -touble sleeping -unusual growth of hair on the face or body -weight gain This list may not describe all possible side effects. Call your doctor for medical advice about side effects. You may report side effects to FDA at 1-800-FDA-1088. Where should I keep my medicine? This drug is given in a hospital or clinic and will not be stored at home. NOTE: This sheet is a summary. It may not cover all possible information. If you have questions about this medicine, talk to your doctor, pharmacist, or health care provider.  2018 Elsevier/Gold Standard (2008-03-30 14:04:12) Dexamethasone injection What is this  medicine? DEXAMETHASONE (dex a METH a sone) is a corticosteroid. It is used to treat inflammation of the skin, joints, lungs, and other organs. Common conditions treated include asthma, allergies, and arthritis.  It is also used for other conditions, like blood disorders and diseases of the adrenal glands. This medicine may be used for other purposes; ask your health care provider or pharmacist if you have questions. COMMON BRAND NAME(S): Decadron, DoubleDex, Simplist Dexamethasone, Solurex What should I tell my health care provider before I take this medicine? They need to know if you have any of these conditions: -blood clotting problems -Cushing's syndrome -diabetes -glaucoma -heart problems or disease -high blood pressure -infection like herpes, measles, tuberculosis, or chickenpox -kidney disease -liver disease -mental problems -myasthenia gravis -osteoporosis -previous heart attack -seizures -stomach, ulcer or intestine disease including colitis and diverticulitis -thyroid problem -an unusual or allergic reaction to dexamethasone, corticosteroids, other medicines, lactose, foods, dyes, or preservatives -pregnant or trying to get pregnant -breast-feeding How should I use this medicine? This medicine is for injection into a muscle, joint, lesion, soft tissue, or vein. It is given by a health care professional in a hospital or clinic setting. Talk to your pediatrician regarding the use of this medicine in children. Special care may be needed. Overdosage: If you think you have taken too much of this medicine contact a poison control center or emergency room at once. NOTE: This medicine is only for you. Do not share this medicine with others. What if I miss a dose? This may not apply. If you are having a series of injections over a prolonged period, try not to miss an appointment. Call your doctor or health care professional to reschedule if you are unable to keep an appointment. What may interact with this medicine? Do not take this medicine with any of the following medications: -mifepristone, RU-486 -vaccines This medicine may also interact with the following medications: -amphotericin  B -antibiotics like clarithromycin, erythromycin, and troleandomycin -aspirin and aspirin-like drugs -barbiturates like phenobarbital -carbamazepine -cholestyramine -cholinesterase inhibitors like donepezil, galantamine, rivastigmine, and tacrine -cyclosporine -digoxin -diuretics -ephedrine -female hormones, like estrogens or progestins and birth control pills -indinavir -isoniazid -ketoconazole -medicines for diabetes -medicines that improve muscle tone or strength for conditions like myasthenia gravis -NSAIDs, medicines for pain and inflammation, like ibuprofen or naproxen -phenytoin -rifampin -thalidomide -warfarin This list may not describe all possible interactions. Give your health care provider a list of all the medicines, herbs, non-prescription drugs, or dietary supplements you use. Also tell them if you smoke, drink alcohol, or use illegal drugs. Some items may interact with your medicine. What should I watch for while using this medicine? Your condition will be monitored carefully while you are receiving this medicine. If you are taking this medicine for a long time, carry an identification card with your name and address, the type and dose of your medicine, and your doctor's name and address. This medicine may increase your risk of getting an infection. Stay away from people who are sick. Tell your doctor or health care professional if you are around anyone with measles or chickenpox. Talk to your health care provider before you get any vaccines that you take this medicine. If you are going to have surgery, tell your doctor or health care professional that you have taken this medicine within the last twelve months. Ask your doctor or health care professional about your diet. You may need to lower the amount of salt you eat. The medicine can increase your blood  sugar. If you are a diabetic check with your doctor if you need help adjusting the dose of your diabetic  medicine. What side effects may I notice from receiving this medicine? Side effects that you should report to your doctor or health care professional as soon as possible: -allergic reactions like skin rash, itching or hives, swelling of the face, lips, or tongue -black or tarry stools -change in the amount of urine -changes in vision -confusion, excitement, restlessness, a false sense of well-being -fever, sore throat, sneezing, cough, or other signs of infection, wounds that will not heal -hallucinations -increased thirst -mental depression, mood swings, mistaken feelings of self importance or of being mistreated -pain in hips, back, ribs, arms, shoulders, or legs -pain, redness, or irritation at the injection site -redness, blistering, peeling or loosening of the skin, including inside the mouth -rounding out of face -swelling of feet or lower legs -unusual bleeding or bruising -unusual tired or weak -wounds that do not heal Side effects that usually do not require medical attention (report to your doctor or health care professional if they continue or are bothersome): -diarrhea or constipation -change in taste -headache -nausea, vomiting -skin problems, acne, thin and shiny skin -touble sleeping -unusual growth of hair on the face or body -weight gain This list may not describe all possible side effects. Call your doctor for medical advice about side effects. You may report side effects to FDA at 1-800-FDA-1088. Where should I keep my medicine? This drug is given in a hospital or clinic and will not be stored at home. NOTE: This sheet is a summary. It may not cover all possible information. If you have questions about this medicine, talk to your doctor, pharmacist, or health care provider.  2018 Elsevier/Gold Standard (2008-03-30 14:04:12) Bendamustine Injection What is this medicine? BENDAMUSTINE (BEN da MUS teen) is a chemotherapy drug. It is used to treat chronic  lymphocytic leukemia and non-Hodgkin lymphoma. This medicine may be used for other purposes; ask your health care provider or pharmacist if you have questions. COMMON BRAND NAME(S): BENDEKA, Treanda What should I tell my health care provider before I take this medicine? They need to know if you have any of these conditions: -infection (especially a virus infection such as chickenpox, cold sores, or herpes) -kidney disease -liver disease -an unusual or allergic reaction to bendamustine, mannitol, other medicines, foods, dyes, or preservatives -pregnant or trying to get pregnant -breast-feeding How should I use this medicine? This medicine is for infusion into a vein. It is given by a health care professional in a hospital or clinic setting. Talk to your pediatrician regarding the use of this medicine in children. Special care may be needed. Overdosage: If you think you have taken too much of this medicine contact a poison control center or emergency room at once. NOTE: This medicine is only for you. Do not share this medicine with others. What if I miss a dose? It is important not to miss your dose. Call your doctor or health care professional if you are unable to keep an appointment. What may interact with this medicine? Do not take this medicine with any of the following medications: -clozapine This medicine may also interact with the following medications: -atazanavir -cimetidine -ciprofloxacin -enoxacin -fluvoxamine -medicines for seizures like carbamazepine and phenobarbital -mexiletine -rifampin -tacrine -thiabendazole -zileuton This list may not describe all possible interactions. Give your health care provider a list of all the medicines, herbs, non-prescription drugs, or dietary supplements you use. Also tell them  if you smoke, drink alcohol, or use illegal drugs. Some items may interact with your medicine. What should I watch for while using this medicine? This drug may  make you feel generally unwell. This is not uncommon, as chemotherapy can affect healthy cells as well as cancer cells. Report any side effects. Continue your course of treatment even though you feel ill unless your doctor tells you to stop. You may need blood work done while you are taking this medicine. Call your doctor or health care professional for advice if you get a fever, chills or sore throat, or other symptoms of a cold or flu. Do not treat yourself. This drug decreases your body's ability to fight infections. Try to avoid being around people who are sick. This medicine may increase your risk to bruise or bleed. Call your doctor or health care professional if you notice any unusual bleeding. Talk to your doctor about your risk of cancer. You may be more at risk for certain types of cancers if you take this medicine. Do not become pregnant while taking this medicine or for 3 months after stopping it. Women should inform their doctor if they wish to become pregnant or think they might be pregnant. Men should not father a child while taking this medicine and for 3 months after stopping it.There is a potential for serious side effects to an unborn child. Talk to your health care professional or pharmacist for more information. Do not breast-feed an infant while taking this medicine. This medicine may interfere with the ability to have a child. You should talk with your doctor or health care professional if you are concerned about your fertility. What side effects may I notice from receiving this medicine? Side effects that you should report to your doctor or health care professional as soon as possible: -allergic reactions like skin rash, itching or hives, swelling of the face, lips, or tongue -low blood counts - this medicine may decrease the number of white blood cells, red blood cells and platelets. You may be at increased risk for infections and bleeding. -redness, blistering, peeling or  loosening of the skin, including inside the mouth -signs of infection - fever or chills, cough, sore throat, pain or difficulty passing urine -signs of decreased platelets or bleeding - bruising, pinpoint red spots on the skin, black, tarry stools, blood in the urine -signs of decreased red blood cells - unusually weak or tired, fainting spells, lightheadedness -signs and symptoms of kidney injury like trouble passing urine or change in the amount of urine -signs and symptoms of liver injury like dark yellow or brown urine; general ill feeling or flu-like symptoms; light-colored stools; loss of appetite; nausea; right upper belly pain; unusually weak or tired; yellowing of the eyes or skin Side effects that usually do not require medical attention (report to your doctor or health care professional if they continue or are bothersome): -constipation -decreased appetite -diarrhea -headache -mouth sores -nausea/vomiting -tiredness This list may not describe all possible side effects. Call your doctor for medical advice about side effects. You may report side effects to FDA at 1-800-FDA-1088. Where should I keep my medicine? This drug is given in a hospital or clinic and will not be stored at home. NOTE: This sheet is a summary. It may not cover all possible information. If you have questions about this medicine, talk to your doctor, pharmacist, or health care provider.  2018 Elsevier/Gold Standard (2015-10-11 08:45:41)

## 2018-12-04 ENCOUNTER — Other Ambulatory Visit: Payer: Self-pay | Admitting: Hematology & Oncology

## 2018-12-04 LAB — PROTEIN ELECTROPHORESIS, SERUM, WITH REFLEX
A/G Ratio: 1.3 (ref 0.7–1.7)
ALPHA-2-GLOBULIN: 0.6 g/dL (ref 0.4–1.0)
Albumin ELP: 3.5 g/dL (ref 2.9–4.4)
Alpha-1-Globulin: 0.2 g/dL (ref 0.0–0.4)
Beta Globulin: 1 g/dL (ref 0.7–1.3)
Gamma Globulin: 0.9 g/dL (ref 0.4–1.8)
Globulin, Total: 2.6 g/dL (ref 2.2–3.9)
M-SPIKE, %: 0.6 g/dL — AB
SPEP Interpretation: 0
Total Protein ELP: 6.1 g/dL (ref 6.0–8.5)

## 2018-12-04 LAB — IMMUNOFIXATION REFLEX, SERUM
IgA: 52 mg/dL — ABNORMAL LOW (ref 64–422)
IgG (Immunoglobin G), Serum: 386 mg/dL — ABNORMAL LOW (ref 700–1600)
IgM (Immunoglobulin M), Srm: 2116 mg/dL — ABNORMAL HIGH (ref 26–217)

## 2018-12-23 ENCOUNTER — Telehealth: Payer: Self-pay

## 2018-12-23 NOTE — Telephone Encounter (Signed)
-----   Message from Gatha Mayer, MD sent at 12/23/2018  4:57 PM EST ----- Regarding: FW: f/u flex sig CHANGE RECALL Please place a sigmoidoscopy  recall in for April 2020 (so I can try to scope her around May or June  Let her know we are waiting on this due to ongoing chemo Tx and I have reviewed w/ dr. 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  Glendell Docker

## 2018-12-23 NOTE — Telephone Encounter (Signed)
Patient informed and recall changed. Kristina Ramos was appreciative for Dr Carlean Purl following up on her.

## 2018-12-28 IMAGING — CT CT ABD-PELV W/ CM
2 of 5 series · 13 of 36 positions shown, 16 images · IV contrast (APPLIED)
Comparison: 09/15/2018

CLINICAL DATA: Follicular lymphoma.

EXAM:
CT CHEST, ABDOMEN, AND PELVIS WITH CONTRAST
TECHNIQUE: Multidetector CT imaging of the chest, abdomen and pelvis was
performed following the standard protocol during bolus
administration of intravenous contrast.
CONTRAST:  100mL ZSD0W0-EBB IOPAMIDOL ([id number]) INJECTION 61%

[Series 2: cap with 2 · axial · 0.71mm/px · z∈[-536,-41]mm · 10 of 122 slices shown, 13 images]
[im 12/122  mediastinal]
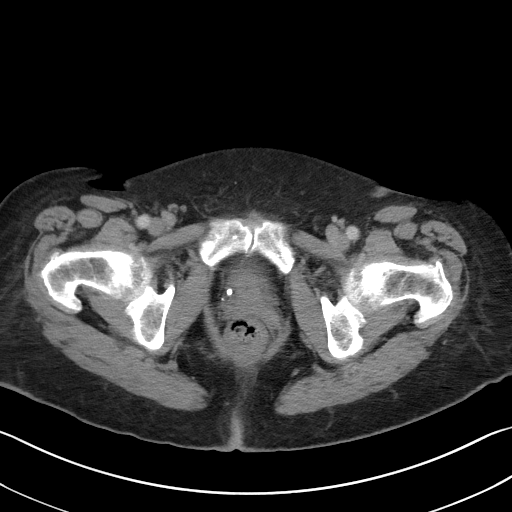
[im 12/122  lung]
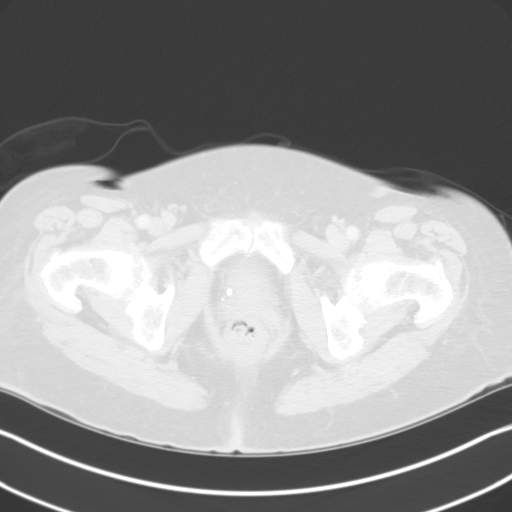
[im 23/122  lung]
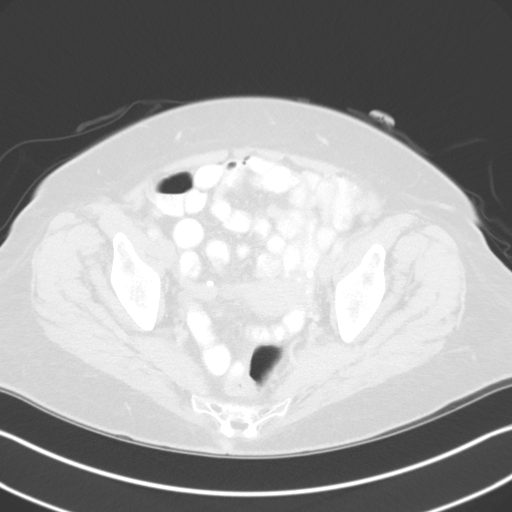
[im 34/122  lung]
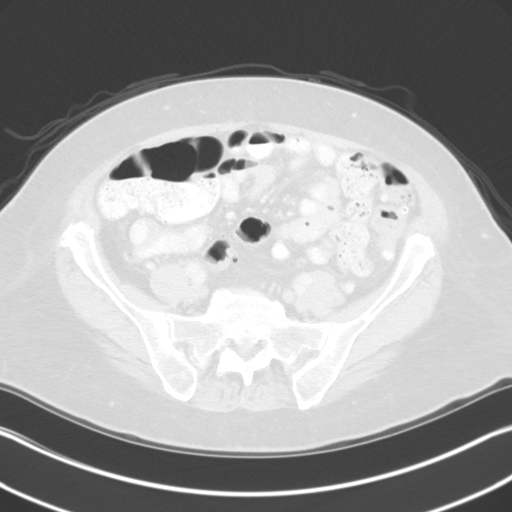
[im 45/122  lung]
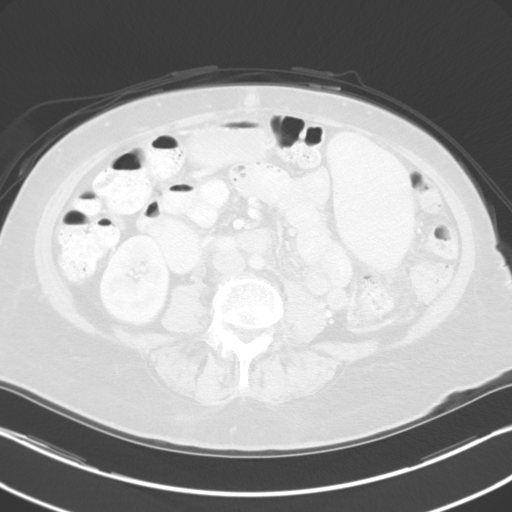
[im 56/122  mediastinal]
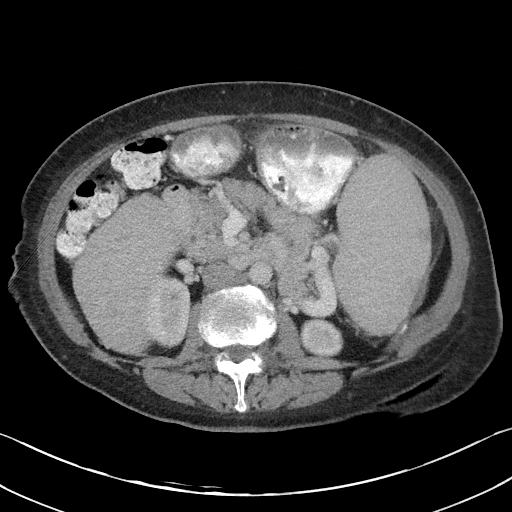
[im 56/122  lung]
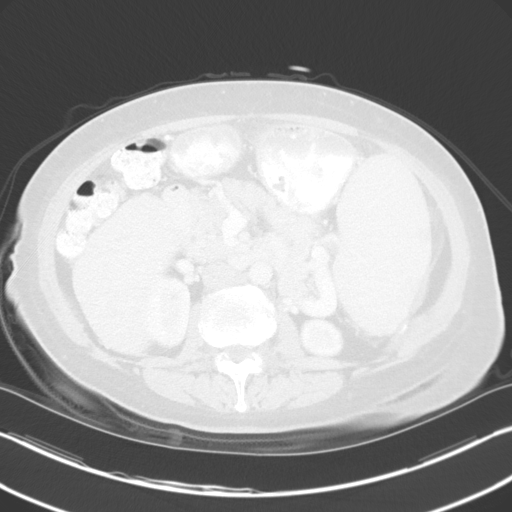
[im 67/122  lung]
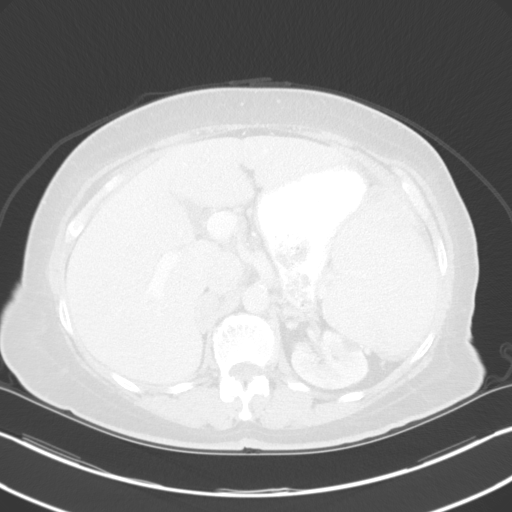
[im 78/122  lung]
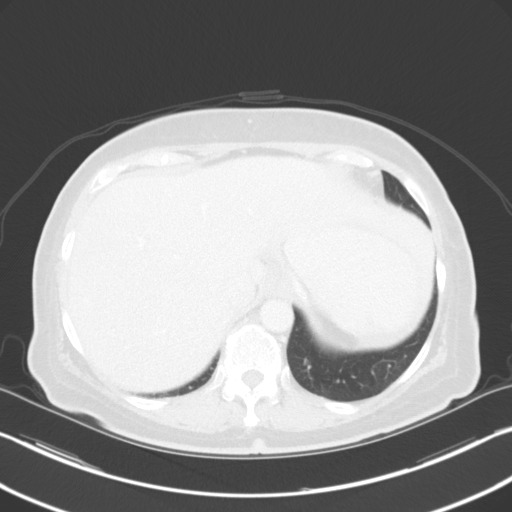
[im 89/122  lung]
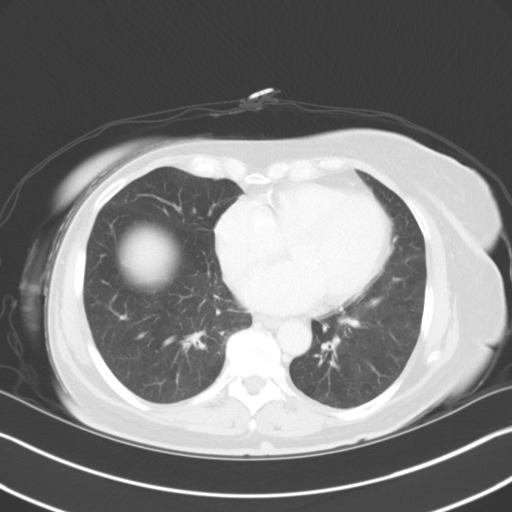
[im 100/122  mediastinal]
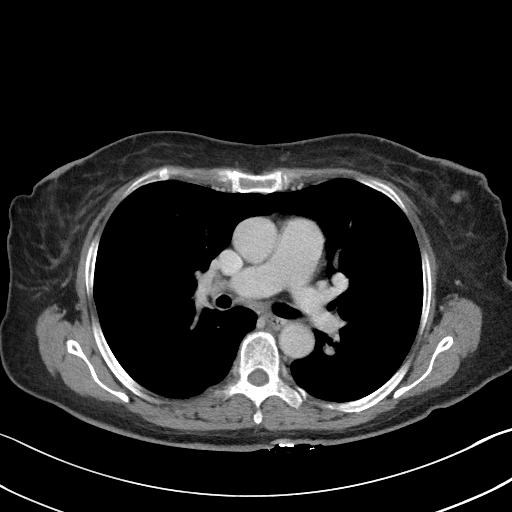
[im 100/122  lung]
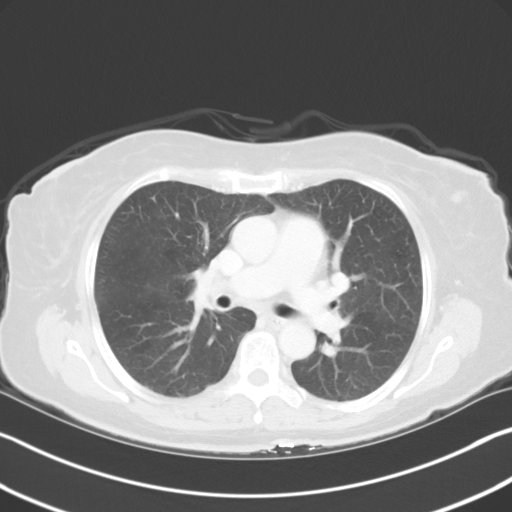
[im 111/122  lung]
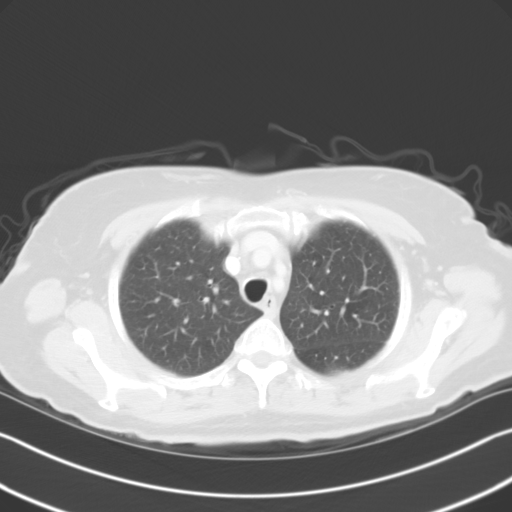

[Series 5: coronals · coronal · 0.71mm/px · 3 of 141 slices shown]
[im 29/141  lung]
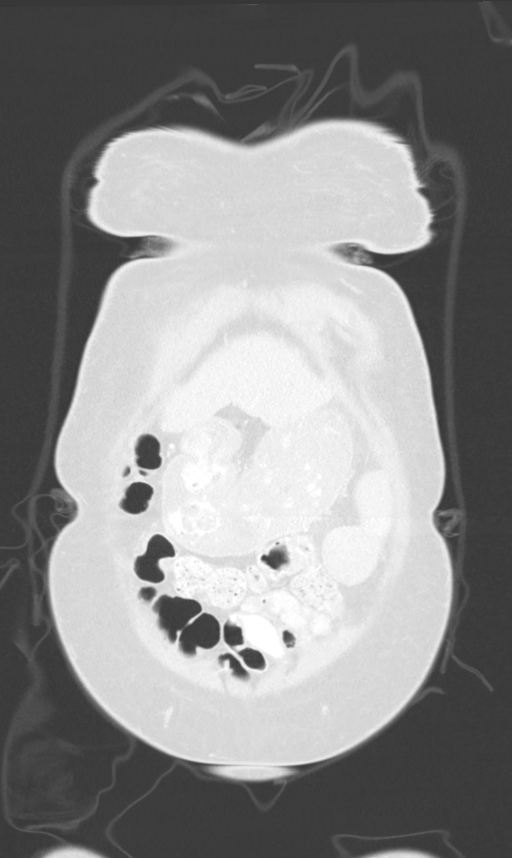
[im 57/141  lung]
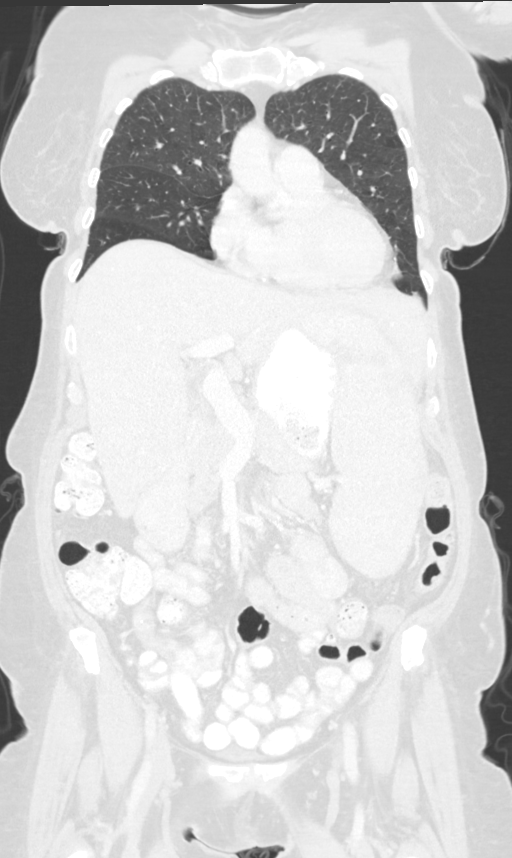
[im 85/141  lung]
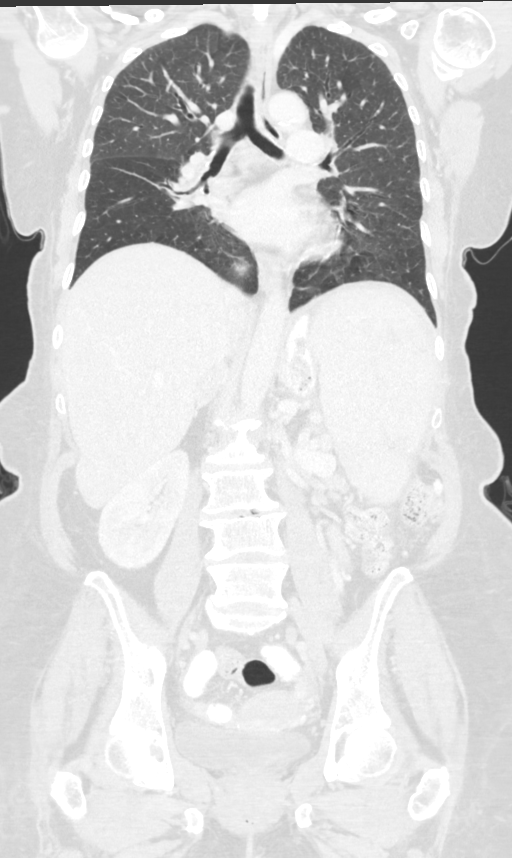

[13 of 36 positions shown; findings below may reference images not displayed]

FINDINGS: CT CHEST FINDINGS

Cardiovascular: Aortic and branch vessel atherosclerosis. Tortuous
thoracic aorta. Normal heart size, without pericardial effusion.
Multivessel coronary artery atherosclerosis. No central pulmonary
embolism, on this non-dedicated study. Pulmonary artery enlargement
again identified.

Mediastinum/Nodes: Left thyroid nodule of 1.3 cm is unchanged. No
axillary adenopathy.

Precarinal node measures 9 mm today versus 11 mm previously.

Subcarinal node measures 5 mm today versus 14 mm on the prior.

No hilar adenopathy.

Fluid level in the distal esophagus.

Lungs/Pleura: No pleural fluid.  Mild centrilobular emphysema.

Musculoskeletal: Left breast nodule is similar to slightly decreased
at 10 mm today versus 1.4 cm on the prior. No acute osseous
abnormality.

CT ABDOMEN PELVIS FINDINGS

Hepatobiliary: Hepatomegaly at 20.4 cm craniocaudal. Hepatic
morphology which is again suspicious for cirrhosis. Caudate and
lateral segment left liver lobe enlargement. No focal liver lesion.
Probable gallstone without acute cholecystitis or biliary duct
dilatation.

Pancreas: Normal, without mass or ductal dilatation.

Spleen: Improvement in splenomegaly at 19.0 cm craniocaudal.
Improvement in areas of wedge-shaped hypoattenuation likely
representing infarcts.

Adrenals/Urinary Tract: Right adrenal thickening at up to 1.3 cm,
similar. Normal left adrenal gland. Left renal hypoattenuating
lesions are too small to characterize. There is an upper pole
posterior left renal 2.0 cm angiomyolipoma which is grossly similar.
Normal right kidney and urinary bladder.

Stomach/Bowel: Normal stomach, without wall thickening. Colonic
stool burden suggests constipation. Scattered colonic diverticula.
Normal terminal ileum and appendix. Normal small bowel.

Vascular/Lymphatic: Normal caliber of the aorta and branch vessels.
Aortocaval node measures 1.1 cm today versus 1.7 cm on the prior.
The porta hepatis adenopathy has resolved. No pelvic sidewall
adenopathy.

Reproductive: Normal uterus and adnexa.

Other: No significant free fluid. No evidence of omental or
peritoneal disease.

Musculoskeletal: Lumbosacral spondylosis.
IMPRESSION: 1. Response to therapy of lymphoma, as evidenced by significant
improvement in splenomegaly and thoracoabdominal adenopathy. No
evidence of new or progressive disease. Improvement in presumed
splenic infarcts.
2. Decreased hepatomegaly. Hepatic morphology again is suspicious
for cirrhosis.
3. Resolved pleural fluid.
4. Aortic atherosclerosis ([id number]), coronary artery
atherosclerosis and emphysema ([id number]).
5. Esophageal air fluid level suggests dysmotility or
gastroesophageal reflux.
6. Similar to slight improvement in left breast nodule, as before.
7. Similar nonspecific left thyroid nodule.

## 2018-12-29 ENCOUNTER — Encounter: Payer: Self-pay | Admitting: Hematology & Oncology

## 2018-12-29 ENCOUNTER — Inpatient Hospital Stay: Payer: Medicare Other

## 2018-12-29 ENCOUNTER — Other Ambulatory Visit: Payer: Self-pay

## 2018-12-29 ENCOUNTER — Inpatient Hospital Stay: Payer: Medicare Other | Attending: Hematology & Oncology | Admitting: Hematology & Oncology

## 2018-12-29 VITALS — BP 121/63 | HR 79 | Temp 98.1°F | Resp 17

## 2018-12-29 VITALS — BP 124/57 | HR 73 | Temp 97.1°F | Resp 18 | Wt 168.5 lb

## 2018-12-29 DIAGNOSIS — E538 Deficiency of other specified B group vitamins: Secondary | ICD-10-CM

## 2018-12-29 DIAGNOSIS — D500 Iron deficiency anemia secondary to blood loss (chronic): Secondary | ICD-10-CM

## 2018-12-29 DIAGNOSIS — C8300 Small cell B-cell lymphoma, unspecified site: Secondary | ICD-10-CM | POA: Diagnosis not present

## 2018-12-29 DIAGNOSIS — Z5111 Encounter for antineoplastic chemotherapy: Secondary | ICD-10-CM | POA: Insufficient documentation

## 2018-12-29 DIAGNOSIS — R7989 Other specified abnormal findings of blood chemistry: Secondary | ICD-10-CM

## 2018-12-29 DIAGNOSIS — C8218 Follicular lymphoma grade II, lymph nodes of multiple sites: Secondary | ICD-10-CM

## 2018-12-29 DIAGNOSIS — R161 Splenomegaly, not elsewhere classified: Secondary | ICD-10-CM | POA: Diagnosis not present

## 2018-12-29 DIAGNOSIS — B161 Acute hepatitis B with delta-agent without hepatic coma: Secondary | ICD-10-CM

## 2018-12-29 LAB — CBC WITH DIFFERENTIAL (CANCER CENTER ONLY)
Abs Immature Granulocytes: 0.02 10*3/uL (ref 0.00–0.07)
Basophils Absolute: 0.1 10*3/uL (ref 0.0–0.1)
Basophils Relative: 1 %
Eosinophils Absolute: 0.2 10*3/uL (ref 0.0–0.5)
Eosinophils Relative: 3 %
HCT: 34.7 % — ABNORMAL LOW (ref 36.0–46.0)
Hemoglobin: 12.2 g/dL (ref 12.0–15.0)
Immature Granulocytes: 0 %
Lymphocytes Relative: 23 %
Lymphs Abs: 1.1 10*3/uL (ref 0.7–4.0)
MCH: 31.8 pg (ref 26.0–34.0)
MCHC: 35.2 g/dL (ref 30.0–36.0)
MCV: 90.4 fL (ref 80.0–100.0)
Monocytes Absolute: 0.5 10*3/uL (ref 0.1–1.0)
Monocytes Relative: 10 %
Neutro Abs: 3 10*3/uL (ref 1.7–7.7)
Neutrophils Relative %: 63 %
Platelet Count: 89 10*3/uL — ABNORMAL LOW (ref 150–400)
RBC: 3.84 MIL/uL — AB (ref 3.87–5.11)
RDW: 14.9 % (ref 11.5–15.5)
WBC Count: 4.8 10*3/uL (ref 4.0–10.5)
nRBC: 0 % (ref 0.0–0.2)

## 2018-12-29 LAB — CMP (CANCER CENTER ONLY)
ALT: 37 U/L (ref 0–44)
ANION GAP: 8 (ref 5–15)
AST: 28 U/L (ref 15–41)
Albumin: 4 g/dL (ref 3.5–5.0)
Alkaline Phosphatase: 134 U/L — ABNORMAL HIGH (ref 38–126)
BUN: 16 mg/dL (ref 8–23)
CHLORIDE: 96 mmol/L — AB (ref 98–111)
CO2: 28 mmol/L (ref 22–32)
Calcium: 9.1 mg/dL (ref 8.9–10.3)
Creatinine: 0.7 mg/dL (ref 0.44–1.00)
GFR, Est AFR Am: >60 mL/min (ref >60)
GFR, Estimated: >60 mL/min (ref >60)
Glucose, Bld: 85 mg/dL (ref 70–99)
POTASSIUM: 4.1 mmol/L (ref 3.5–5.1)
Sodium: 132 mmol/L — ABNORMAL LOW (ref 135–145)
Total Bilirubin: 0.5 mg/dL (ref 0.3–1.2)
Total Protein: 6.8 g/dL (ref 6.5–8.1)

## 2018-12-29 LAB — LACTATE DEHYDROGENASE: LDH: 157 U/L (ref 98–192)

## 2018-12-29 MED ORDER — CYANOCOBALAMIN 1000 MCG/ML IJ SOLN
INTRAMUSCULAR | Status: AC
  Filled 2018-12-29: qty 1

## 2018-12-29 MED ORDER — PALONOSETRON HCL INJECTION 0.25 MG/5ML
INTRAVENOUS | Status: AC
  Filled 2018-12-29: qty 5

## 2018-12-29 MED ORDER — ACETAMINOPHEN 325 MG PO TABS
650.0000 mg | ORAL_TABLET | Freq: Once | ORAL | Status: AC
  Administered 2018-12-29: 650 mg via ORAL

## 2018-12-29 MED ORDER — CYANOCOBALAMIN 1000 MCG/ML IJ SOLN
1000.0000 ug | Freq: Once | INTRAMUSCULAR | Status: AC
  Administered 2018-12-29: 1000 ug via INTRAMUSCULAR

## 2018-12-29 MED ORDER — SODIUM CHLORIDE 0.9 % IV SOLN
Freq: Once | INTRAVENOUS | Status: AC
  Administered 2018-12-29: 09:00:00 via INTRAVENOUS
  Filled 2018-12-29: qty 250

## 2018-12-29 MED ORDER — ACETAMINOPHEN 325 MG PO TABS
ORAL_TABLET | ORAL | Status: AC
  Filled 2018-12-29: qty 2

## 2018-12-29 MED ORDER — DIPHENHYDRAMINE HCL 50 MG/ML IJ SOLN
50.0000 mg | Freq: Once | INTRAMUSCULAR | Status: AC
  Administered 2018-12-29: 50 mg via INTRAVENOUS

## 2018-12-29 MED ORDER — SODIUM CHLORIDE 0.9 % IV SOLN
81.0000 mg/m2 | Freq: Once | INTRAVENOUS | Status: AC
  Administered 2018-12-29: 150 mg via INTRAVENOUS
  Filled 2018-12-29: qty 6

## 2018-12-29 MED ORDER — DIPHENHYDRAMINE HCL 50 MG/ML IJ SOLN
INTRAMUSCULAR | Status: AC
  Filled 2018-12-29: qty 1

## 2018-12-29 MED ORDER — SODIUM CHLORIDE 0.9 % IV SOLN
1000.0000 mg | Freq: Once | INTRAVENOUS | Status: AC
  Administered 2018-12-29: 1000 mg via INTRAVENOUS
  Filled 2018-12-29: qty 40

## 2018-12-29 MED ORDER — SODIUM CHLORIDE 0.9 % IV SOLN
20.0000 mg | Freq: Once | INTRAVENOUS | Status: AC
  Administered 2018-12-29: 20 mg via INTRAVENOUS
  Filled 2018-12-29: qty 2

## 2018-12-29 MED ORDER — PALONOSETRON HCL INJECTION 0.25 MG/5ML
0.2500 mg | Freq: Once | INTRAVENOUS | Status: AC
  Administered 2018-12-29: 0.25 mg via INTRAVENOUS

## 2018-12-29 NOTE — Progress Notes (Signed)
Okay to treat today with pltc = 89 per Dr. Marin Olp.

## 2018-12-29 NOTE — Patient Instructions (Signed)
Dexamethasone injection What is this medicine? DEXAMETHASONE (dex a METH a sone) is a corticosteroid. It is used to treat inflammation of the skin, joints, lungs, and other organs. Common conditions treated include asthma, allergies, and arthritis. It is also used for other conditions, like blood disorders and diseases of the adrenal glands. This medicine may be used for other purposes; ask your health care provider or pharmacist if you have questions. COMMON BRAND NAME(S): Decadron, DoubleDex, Simplist Dexamethasone, Solurex What should I tell my health care provider before I take this medicine? They need to know if you have any of these conditions: -blood clotting problems -Cushing's syndrome -diabetes -glaucoma -heart problems or disease -high blood pressure -infection like herpes, measles, tuberculosis, or chickenpox -kidney disease -liver disease -mental problems -myasthenia gravis -osteoporosis -previous heart attack -seizures -stomach, ulcer or intestine disease including colitis and diverticulitis -thyroid problem -an unusual or allergic reaction to dexamethasone, corticosteroids, other medicines, lactose, foods, dyes, or preservatives -pregnant or trying to get pregnant -breast-feeding How should I use this medicine? This medicine is for injection into a muscle, joint, lesion, soft tissue, or vein. It is given by a health care professional in a hospital or clinic setting. Talk to your pediatrician regarding the use of this medicine in children. Special care may be needed. Overdosage: If you think you have taken too much of this medicine contact a poison control center or emergency room at once. NOTE: This medicine is only for you. Do not share this medicine with others. What if I miss a dose? This may not apply. If you are having a series of injections over a prolonged period, try not to miss an appointment. Call your doctor or health care professional to reschedule if you  are unable to keep an appointment. What may interact with this medicine? Do not take this medicine with any of the following medications: -mifepristone, RU-486 -vaccines This medicine may also interact with the following medications: -amphotericin B -antibiotics like clarithromycin, erythromycin, and troleandomycin -aspirin and aspirin-like drugs -barbiturates like phenobarbital -carbamazepine -cholestyramine -cholinesterase inhibitors like donepezil, galantamine, rivastigmine, and tacrine -cyclosporine -digoxin -diuretics -ephedrine -female hormones, like estrogens or progestins and birth control pills -indinavir -isoniazid -ketoconazole -medicines for diabetes -medicines that improve muscle tone or strength for conditions like myasthenia gravis -NSAIDs, medicines for pain and inflammation, like ibuprofen or naproxen -phenytoin -rifampin -thalidomide -warfarin This list may not describe all possible interactions. Give your health care provider a list of all the medicines, herbs, non-prescription drugs, or dietary supplements you use. Also tell them if you smoke, drink alcohol, or use illegal drugs. Some items may interact with your medicine. What should I watch for while using this medicine? Your condition will be monitored carefully while you are receiving this medicine. If you are taking this medicine for a long time, carry an identification card with your name and address, the type and dose of your medicine, and your doctor's name and address. This medicine may increase your risk of getting an infection. Stay away from people who are sick. Tell your doctor or health care professional if you are around anyone with measles or chickenpox. Talk to your health care provider before you get any vaccines that you take this medicine. If you are going to have surgery, tell your doctor or health care professional that you have taken this medicine within the last twelve months. Ask your  doctor or health care professional about your diet. You may need to lower the amount of salt you eat. The  medicine can increase your blood sugar. If you are a diabetic check with your doctor if you need help adjusting the dose of your diabetic medicine. What side effects may I notice from receiving this medicine? Side effects that you should report to your doctor or health care professional as soon as possible: -allergic reactions like skin rash, itching or hives, swelling of the face, lips, or tongue -black or tarry stools -change in the amount of urine -changes in vision -confusion, excitement, restlessness, a false sense of well-being -fever, sore throat, sneezing, cough, or other signs of infection, wounds that will not heal -hallucinations -increased thirst -mental depression, mood swings, mistaken feelings of self importance or of being mistreated -pain in hips, back, ribs, arms, shoulders, or legs -pain, redness, or irritation at the injection site -redness, blistering, peeling or loosening of the skin, including inside the mouth -rounding out of face -swelling of feet or lower legs -unusual bleeding or bruising -unusual tired or weak -wounds that do not heal Side effects that usually do not require medical attention (report to your doctor or health care professional if they continue or are bothersome): -diarrhea or constipation -change in taste -headache -nausea, vomiting -skin problems, acne, thin and shiny skin -touble sleeping -unusual growth of hair on the face or body -weight gain This list may not describe all possible side effects. Call your doctor for medical advice about side effects. You may report side effects to FDA at 1-800-FDA-1088. Where should I keep my medicine? This drug is given in a hospital or clinic and will not be stored at home. NOTE: This sheet is a summary. It may not cover all possible information. If you have questions about this medicine, talk to  your doctor, pharmacist, or health care provider.  2019 Elsevier/Gold Standard (2008-03-30 14:04:12) Olsalazine capsules What is this medicine? OLSALAZINE (ole SAL a zeen) is used to treat ulcerative colitis. This medicine is also used to help maintain remission of the disease. This medicine may be used for other purposes; ask your health care provider or pharmacist if you have questions. COMMON BRAND NAME(S): Dipentum What should I tell my health care provider before I take this medicine? They need to know if you have any of these conditions: -kidney disease -an unusual or allergic reaction to olsalazine, aspirin, other medicines, foods, dyes, or preservatives -pregnant or trying to get pregnant -breast-feeding How should I use this medicine? Take this medicine by mouth with a glass of water. Follow the directions on the prescription label. Take with food. Take your doses at regular intervals. Do not take your medicine more often than directed. Talk to your pediatrician regarding the use of this medicine in children. Special care may be needed. Overdosage: If you think you have taken too much of this medicine contact a poison control center or emergency room at once. NOTE: This medicine is only for you. Do not share this medicine with others. What if I miss a dose? If you miss a dose, take it as soon as you can. If it is almost time for your next dose, take only that dose. Do not take double or extra doses. What may interact with this medicine? -warfarin This list may not describe all possible interactions. Give your health care provider a list of all the medicines, herbs, non-prescription drugs, or dietary supplements you use. Also tell them if you smoke, drink alcohol, or use illegal drugs. Some items may interact with your medicine. What should I watch for while using  this medicine? Tell your doctor or health care professional if your symptoms do not start to get better or of they get  worse. It may take several weeks of treatment to achieve the full benefit. It is important that you continue taking your medicine. Do not stop taking except on the advice of your doctor or health care professional. This medicine may cause diarrhea in some patients. Contact your doctor or health care professional if you experience any diarrhea while taking this medicine. What side effects may I notice from receiving this medicine? Side effects that you should report to your doctor or health care professional as soon as possible: -breathing problems -chest pain -diarrhea -fever, chills, sore throat -skin rash or itching -unusual bleeding or bruising -unusually weak or tired -yellowing of the eyes or skin Side effects that usually do not require medical attention (report to your doctor or health care professional if they continue or are bothersome): -muscle aches -nausea, vomiting -stomach pain or cramps This list may not describe all possible side effects. Call your doctor for medical advice about side effects. You may report side effects to FDA at 1-800-FDA-1088. Where should I keep my medicine? Keep out of the reach of children. Store at room temperature between 15 and 30 degrees C (59 and 86 degrees F). Throw away any unused medicine after the expiration date. NOTE: This sheet is a summary. It may not cover all possible information. If you have questions about this medicine, talk to your doctor, pharmacist, or health care provider.  2019 Elsevier/Gold Standard (2008-07-04 17:08:09) Acetaminophen tablets or caplets What is this medicine? ACETAMINOPHEN (a set a MEE noe fen) is a pain reliever. It is used to treat mild pain and fever. This medicine may be used for other purposes; ask your health care provider or pharmacist if you have questions. COMMON BRAND NAME(S): Aceta, Actamin, Anacin Aspirin Free, Genapap, Genebs, Mapap, Pain & Fever, Pain and Fever, PAIN RELIEF, PAIN RELIEF Extra  Strength, Pain Reliever, Panadol, PHARBETOL, Q-Pap, Q-Pap Extra Strength, Tylenol, Tylenol CrushableTablet, Tylenol Extra Strength, XS No Aspirin, XS Pain Reliever What should I tell my health care provider before I take this medicine? They need to know if you have any of these conditions: -if you often drink alcohol -liver disease -an unusual or allergic reaction to acetaminophen, other medicines, foods, dyes, or preservatives -pregnant or trying to get pregnant -breast-feeding How should I use this medicine? Take this medicine by mouth with a glass of water. Follow the directions on the package or prescription label. Take your medicine at regular intervals. Do not take your medicine more often than directed. Talk to your pediatrician regarding the use of this medicine in children. While this drug may be prescribed for children as young as 60 years of age for selected conditions, precautions do apply. Overdosage: If you think you have taken too much of this medicine contact a poison control center or emergency room at once. NOTE: This medicine is only for you. Do not share this medicine with others. What if I miss a dose? If you miss a dose, take it as soon as you can. If it is almost time for your next dose, take only that dose. Do not take double or extra doses. What may interact with this medicine? -alcohol -imatinib -isoniazid -other medicines with acetaminophen This list may not describe all possible interactions. Give your health care provider a list of all the medicines, herbs, non-prescription drugs, or dietary supplements you use. Also tell them  if you smoke, drink alcohol, or use illegal drugs. Some items may interact with your medicine. What should I watch for while using this medicine? Tell your doctor or health care professional if the pain lasts more than 10 days (5 days for children), if it gets worse, or if there is a new or different kind of pain. Also, check with your doctor  if a fever lasts for more than 3 days. Do not take other medicines that contain acetaminophen with this medicine. Always read labels carefully. If you have questions, ask your doctor or pharmacist. If you take too much acetaminophen get medical help right away. Too much acetaminophen can be very dangerous and cause liver damage. Even if you do not have symptoms, it is important to get help right away. What side effects may I notice from receiving this medicine? Side effects that you should report to your doctor or health care professional as soon as possible: -allergic reactions like skin rash, itching or hives, swelling of the face, lips, or tongue -breathing problems -fever or sore throat -redness, blistering, peeling or loosening of the skin, including inside the mouth -trouble passing urine or change in the amount of urine -unusual bleeding or bruising -unusually weak or tired -yellowing of the eyes or skin Side effects that usually do not require medical attention (report to your doctor or health care professional if they continue or are bothersome): -headache -nausea, stomach upset This list may not describe all possible side effects. Call your doctor for medical advice about side effects. You may report side effects to FDA at 1-800-FDA-1088. Where should I keep my medicine? Keep out of reach of children. Store at room temperature between 20 and 25 degrees C (68 and 77 degrees F). Protect from moisture and heat. Throw away any unused medicine after the expiration date. NOTE: This sheet is a summary. It may not cover all possible information. If you have questions about this medicine, talk to your doctor, pharmacist, or health care provider.  2019 Elsevier/Gold Standard (2013-08-01 12:54:16) Palonosetron Injection What is this medicine? PALONOSETRON (pal oh NOE se tron) is used to prevent nausea and vomiting caused by chemotherapy. It also helps prevent delayed nausea and vomiting that  may occur a few days after your treatment. This medicine may be used for other purposes; ask your health care provider or pharmacist if you have questions. COMMON BRAND NAME(S): Aloxi What should I tell my health care provider before I take this medicine? They need to know if you have any of these conditions: -an unusual or allergic reaction to palonosetron, dolasetron, granisetron, ondansetron, other medicines, foods, dyes, or preservatives -pregnant or trying to get pregnant -breast-feeding How should I use this medicine? This medicine is for infusion into a vein. It is given by a health care professional in a hospital or clinic setting. Talk to your pediatrician regarding the use of this medicine in children. While this drug may be prescribed for children as young as 1 month for selected conditions, precautions do apply. Overdosage: If you think you have taken too much of this medicine contact a poison control center or emergency room at once. NOTE: This medicine is only for you. Do not share this medicine with others. What if I miss a dose? This does not apply. What may interact with this medicine? -certain medicines for depression, anxiety, or psychotic disturbances -fentanyl -linezolid -MAOIs like Carbex, Eldepryl, Marplan, Nardil, and Parnate -methylene blue (injected into a vein) -tramadol This list may not describe  all possible interactions. Give your health care provider a list of all the medicines, herbs, non-prescription drugs, or dietary supplements you use. Also tell them if you smoke, drink alcohol, or use illegal drugs. Some items may interact with your medicine. What should I watch for while using this medicine? Your condition will be monitored carefully while you are receiving this medicine. What side effects may I notice from receiving this medicine? Side effects that you should report to your doctor or health care professional as soon as possible: -allergic reactions  like skin rash, itching or hives, swelling of the face, lips, or tongue -breathing problems -confusion -dizziness -fast, irregular heartbeat -fever and chills -loss of balance or coordination -seizures -sweating -swelling of the hands and feet -tremors -unusually weak or tired Side effects that usually do not require medical attention (report to your doctor or health care professional if they continue or are bothersome): -constipation or diarrhea -headache This list may not describe all possible side effects. Call your doctor for medical advice about side effects. You may report side effects to FDA at 1-800-FDA-1088. Where should I keep my medicine? This drug is given in a hospital or clinic and will not be stored at home. NOTE: This sheet is a summary. It may not cover all possible information. If you have questions about this medicine, talk to your doctor, pharmacist, or health care provider.  2019 Elsevier/Gold Standard (2013-10-14 10:38:36) Obinutuzumab injection What is this medicine? OBINUTUZUMAB (OH bi nue TOOZ ue mab) is a monoclonal antibody. It is used to treat chronic lymphocytic leukemia (CLL) and a type of non-Hodgkin lymphoma (NHL), follicular lymphoma. This medicine may be used for other purposes; ask your health care provider or pharmacist if you have questions. COMMON BRAND NAME(S): GAZYVA What should I tell my health care provider before I take this medicine? They need to know if you have any of these conditions: -infection (especially a virus infection such as hepatitis B virus) -lung or breathing disease -heart disease -take medicines that treat or prevent blood clots -an unusual or allergic reaction to obinutuzumab, other medicines, foods, dyes, or preservatives -pregnant or trying to get pregnant -breast-feeding How should I use this medicine? This medicine is for infusion into a vein. It is given by a health care professional in a hospital or clinic  setting. Talk to your pediatrician regarding the use of this medicine in children. Special care may be needed. Overdosage: If you think you have taken too much of this medicine contact a poison control center or emergency room at once. NOTE: This medicine is only for you. Do not share this medicine with others. What if I miss a dose? Keep appointments for follow-up doses as directed. It is important not to miss your dose. Call your doctor or health care professional if you are unable to keep an appointment. What may interact with this medicine? -live virus vaccines This list may not describe all possible interactions. Give your health care provider a list of all the medicines, herbs, non-prescription drugs, or dietary supplements you use. Also tell them if you smoke, drink alcohol, or use illegal drugs. Some items may interact with your medicine. What should I watch for while using this medicine? Report any side effects that you notice during your treatment right away, such as changes in your breathing, fever, chills, dizziness or lightheadedness. These effects are more common with the first dose. Visit your prescriber or health care professional for checks on your progress. You will need to  have regular blood work. Report any other side effects. The side effects of this medicine can continue after you finish your treatment. Continue your course of treatment even though you feel ill unless your doctor tells you to stop. Call your doctor or health care professional for advice if you get a fever, chills or sore throat, or other symptoms of a cold or flu. Do not treat yourself. This drug decreases your body's ability to fight infections. Try to avoid being around people who are sick. This medicine may increase your risk to bruise or bleed. Call your doctor or health care professional if you notice any unusual bleeding. What side effects may I notice from receiving this medicine? Side effects that you  should report to your doctor or health care professional as soon as possible: -allergic reactions like skin rash, itching or hives, swelling of the face, lips, or tongue -breathing problems -changes in vision -chest pain or chest tightness -confusion -dizziness -loss of balance or coordination -low blood counts - this medicine may decrease the number of white blood cells, red blood cells and platelets. You may be at increased risk for infections and bleeding. -signs of decreased platelets or bleeding - bruising, pinpoint red spots on the skin, black, tarry stools, blood in the urine -signs of infection - fever or chills, cough, sore throat, pain or trouble passing urine -signs and symptoms of liver injury like dark yellow or brown urine; general ill feeling or flu-like symptoms; light-colored stools; loss of appetite; nausea; right upper belly pain; unusually weak or tired; yellowing of the eyes or skin -trouble speaking or understanding -trouble walking -vomiting Side effects that usually do not require medical attention (report to your doctor or health care professional if they continue or are bothersome): -constipation -joint pain -muscle pain This list may not describe all possible side effects. Call your doctor for medical advice about side effects. You may report side effects to FDA at 1-800-FDA-1088. Where should I keep my medicine? This drug is only given in a hospital or clinic and will not be stored at home. NOTE: This sheet is a summary. It may not cover all possible information. If you have questions about this medicine, talk to your doctor, pharmacist, or health care provider.  2019 Elsevier/Gold Standard (2016-01-10 08:54:03) Bendamustine Injection What is this medicine? BENDAMUSTINE (BEN da MUS teen) is a chemotherapy drug. It is used to treat chronic lymphocytic leukemia and non-Hodgkin lymphoma. This medicine may be used for other purposes; ask your health care provider  or pharmacist if you have questions. COMMON BRAND NAME(S): BENDEKA, Treanda What should I tell my health care provider before I take this medicine? They need to know if you have any of these conditions: -infection (especially a virus infection such as chickenpox, cold sores, or herpes) -kidney disease -liver disease -an unusual or allergic reaction to bendamustine, mannitol, other medicines, foods, dyes, or preservatives -pregnant or trying to get pregnant -breast-feeding How should I use this medicine? This medicine is for infusion into a vein. It is given by a health care professional in a hospital or clinic setting. Talk to your pediatrician regarding the use of this medicine in children. Special care may be needed. Overdosage: If you think you have taken too much of this medicine contact a poison control center or emergency room at once. NOTE: This medicine is only for you. Do not share this medicine with others. What if I miss a dose? It is important not to miss your dose.  Call your doctor or health care professional if you are unable to keep an appointment. What may interact with this medicine? Do not take this medicine with any of the following medications: -clozapine This medicine may also interact with the following medications: -atazanavir -cimetidine -ciprofloxacin -enoxacin -fluvoxamine -medicines for seizures like carbamazepine and phenobarbital -mexiletine -rifampin -tacrine -thiabendazole -zileuton This list may not describe all possible interactions. Give your health care provider a list of all the medicines, herbs, non-prescription drugs, or dietary supplements you use. Also tell them if you smoke, drink alcohol, or use illegal drugs. Some items may interact with your medicine. What should I watch for while using this medicine? This drug may make you feel generally unwell. This is not uncommon, as chemotherapy can affect healthy cells as well as cancer cells. Report  any side effects. Continue your course of treatment even though you feel ill unless your doctor tells you to stop. You may need blood work done while you are taking this medicine. Call your doctor or health care professional for advice if you get a fever, chills or sore throat, or other symptoms of a cold or flu. Do not treat yourself. This drug decreases your body's ability to fight infections. Try to avoid being around people who are sick. This medicine may increase your risk to bruise or bleed. Call your doctor or health care professional if you notice any unusual bleeding. Talk to your doctor about your risk of cancer. You may be more at risk for certain types of cancers if you take this medicine. Do not become pregnant while taking this medicine or for 3 months after stopping it. Women should inform their doctor if they wish to become pregnant or think they might be pregnant. Men should not father a child while taking this medicine and for 3 months after stopping it.There is a potential for serious side effects to an unborn child. Talk to your health care professional or pharmacist for more information. Do not breast-feed an infant while taking this medicine. This medicine may interfere with the ability to have a child. You should talk with your doctor or health care professional if you are concerned about your fertility. What side effects may I notice from receiving this medicine? Side effects that you should report to your doctor or health care professional as soon as possible: -allergic reactions like skin rash, itching or hives, swelling of the face, lips, or tongue -low blood counts - this medicine may decrease the number of white blood cells, red blood cells and platelets. You may be at increased risk for infections and bleeding. -redness, blistering, peeling or loosening of the skin, including inside the mouth -signs of infection - fever or chills, cough, sore throat, pain or difficulty  passing urine -signs of decreased platelets or bleeding - bruising, pinpoint red spots on the skin, black, tarry stools, blood in the urine -signs of decreased red blood cells - unusually weak or tired, fainting spells, lightheadedness -signs and symptoms of kidney injury like trouble passing urine or change in the amount of urine -signs and symptoms of liver injury like dark yellow or brown urine; general ill feeling or flu-like symptoms; light-colored stools; loss of appetite; nausea; right upper belly pain; unusually weak or tired; yellowing of the eyes or skin Side effects that usually do not require medical attention (report to your doctor or health care professional if they continue or are bothersome): -constipation -decreased appetite -diarrhea -headache -mouth sores -nausea/vomiting -tiredness This list may not  describe all possible side effects. Call your doctor for medical advice about side effects. You may report side effects to FDA at 1-800-FDA-1088. Where should I keep my medicine? This drug is given in a hospital or clinic and will not be stored at home. NOTE: This sheet is a summary. It may not cover all possible information. If you have questions about this medicine, talk to your doctor, pharmacist, or health care provider.  2019 Elsevier/Gold Standard (2015-10-11 08:45:41)

## 2018-12-29 NOTE — Progress Notes (Signed)
Hematology and Oncology Follow Up Visit  Kristina Ramos [id number] 1941-12-05 78 y.o. 12/29/2018   Principle Diagnosis:  Follicular lymphoplasmacytic B-cell Non-Hodgkin's Lymphoma  Current Therapy:   Gazyva/Bendamustine -  S/p cycle #3    Interim History: [name] is here today for follow-up and treatment.  So far, she is done very well with treatment.  She is had 3 cycles so far.  She has gone through these cycles without any issues.  She had a very nice Christmas.  She had a very nice New Year's.  She has had no problems with lymph node swelling.  She is gained some weight.  She is eating better.  I think a lot of this is because her spleen has retracted in size nicely.  Her last monoclonal spike was 0.6 g/dL.  Her IgM level was down to 1970 mg/dL.  She has had no change in bowel or bladder habits.  There is been no mouth sores.  She has had no leg swelling.  There is been no rashes.  Overall, her performance status is ECOG 1.   Medications:  Allergies as of 12/29/2018      Reactions   Aspirin    REACTION: pt states that it irritates her stomach, coated ASA is ok      Medication List       Accurate as of December 29, 2018  8:46 AM. Always use your most recent med list.        allopurinol 100 MG tablet Commonly known as:  ZYLOPRIM TAKE 1 TABLET BY MOUTH EVERY DAY   amoxicillin 500 MG capsule Commonly known as:  AMOXIL Take 2,000 mg by mouth as needed. only for dental procedeure   dexamethasone 4 MG tablet Commonly known as:  DECADRON Take 2 tablets (8 mg total) by mouth daily. Start the day after chemotherapy for 2 days. Take with food.   famciclovir 250 MG tablet Commonly known as:  FAMVIR Take 1 tablet (250 mg total) by mouth daily.   levothyroxine 75 MCG tablet Commonly known as:  SYNTHROID, LEVOTHROID TAKE 1 TABLET BY MOUTH EVERY DAY   lisinopril-hydrochlorothiazide 10-12.5 MG tablet Commonly known as:  PRINZIDE,ZESTORETIC Take 1 tablet by mouth  daily.   LORazepam 0.5 MG tablet Commonly known as:  ATIVAN Take 1 tablet (0.5 mg total) by mouth every 6 (six) hours as needed (Nausea or vomiting).   montelukast 10 MG tablet Commonly known as:  SINGULAIR Take 1 tablet (10 mg total) by mouth at bedtime.   ondansetron 8 MG tablet Commonly known as:  ZOFRAN Take 1 tablet (8 mg total) by mouth 2 (two) times daily. Start second day after chemotherapy. Then take as needed for nausea or vomiting.   prochlorperazine 10 MG tablet Commonly known as:  COMPAZINE Take 1 tablet (10 mg total) by mouth every 6 (six) hours as needed (Nausea or vomiting).   Vitamin D 50 MCG (2000 UT) Caps Take 1 capsule by mouth daily.       Allergies:  Allergies  Allergen Reactions  . Aspirin     REACTION: pt states that it irritates her stomach, coated ASA is ok    Past Medical History, Surgical history, Social history, and Family History were reviewed and updated.  Review of Systems: Review of Systems  Constitutional: Negative.   HENT: Negative.   Eyes: Negative.   Respiratory: Negative.   Cardiovascular: Negative.   Gastrointestinal: Negative.   Genitourinary: Negative.   Musculoskeletal: Negative.   Skin: Negative.  Neurological: Negative.   Endo/Heme/Allergies: Negative.   Psychiatric/Behavioral: Negative.      Physical Exam:  weight is 168 lb 8 oz (76.4 kg). Her oral temperature is 97.1 F (36.2 C) (abnormal). Her blood pressure is 124/57 (abnormal) and her pulse is 73. Her respiration is 18 and oxygen saturation is 100%.   Wt Readings from Last 3 Encounters:  12/29/18 168 lb 8 oz (76.4 kg)  11/03/18 159 lb (72.1 kg)  10/19/18 159 lb (72.1 kg)    Physical Exam Vitals signs reviewed.  HENT:     Head: Normocephalic and atraumatic.  Eyes:     Pupils: Pupils are equal, round, and reactive to light.  Neck:     Musculoskeletal: Normal range of motion.  Cardiovascular:     Rate and Rhythm: Normal rate and regular rhythm.      Heart sounds: Normal heart sounds.  Pulmonary:     Effort: Pulmonary effort is normal.     Breath sounds: Normal breath sounds.  Abdominal:     General: Bowel sounds are normal.     Palpations: Abdomen is soft.     Comments: Abdominal exam shows a soft abdomen.  Bowel sounds are present.  There is no fluid wave.  There is no inguinal adenopathy.  There is no hepatomegaly.  Her spleen has shrunk down quite nicely.  Her spleen is now about 4 cm below the left costal margin.  Musculoskeletal: Normal range of motion.        General: No tenderness or deformity.  Lymphadenopathy:     Cervical: No cervical adenopathy.  Skin:    General: Skin is warm and dry.     Findings: No erythema or rash.  Neurological:     Mental Status: She is alert and oriented to person, place, and time.  Psychiatric:        Behavior: Behavior normal.        Thought Content: Thought content normal.        Judgment: Judgment normal.      Lab Results  Component Value Date   WBC 4.8 12/29/2018   HGB 12.2 12/29/2018   HCT 34.7 (L) 12/29/2018   MCV 90.4 12/29/2018   PLT 89 (L) 12/29/2018   Lab Results  Component Value Date   FERRITIN 849 (H) 11/03/2018   IRON 100 11/03/2018   TIBC 281 11/03/2018   UIBC 182 11/03/2018   IRONPCTSAT 35 11/03/2018   Lab Results  Component Value Date   RETICCTPCT 2.0 11/03/2018   RBC 3.84 (L) 12/29/2018   RETICCTABS 105,600 (H) 03/03/2018   Lab Results  Component Value Date   KPAFRELGTCHN 74.9 (H) 12/01/2018   LAMBDASER 3.7 (L) 12/01/2018   KAPLAMBRATIO 20.24 (H) 12/01/2018   Lab Results  Component Value Date   IGGSERUM 358 (L) 12/01/2018   IGGSERUM 386 (L) 12/01/2018   IGA 50 (L) 12/01/2018   IGA 52 (L) 12/01/2018   IGMSERUM 1,970 (H) 12/01/2018   IGMSERUM 2,116 (H) 12/01/2018   Lab Results  Component Value Date   TOTALPROTELP 6.1 12/01/2018   ALBUMINELP 3.5 12/01/2018   A1GS 0.2 12/01/2018   A2GS 0.6 12/01/2018   BETS 1.0 12/01/2018   GAMS 0.9 12/01/2018    MSPIKE 0.6 (H) 12/01/2018   SPEI Comment 11/03/2018     Chemistry      Component Value Date/Time   NA 132 (L) 12/29/2018 0757   K 4.1 12/29/2018 0757   CL 96 (L) 12/29/2018 0757   CO2 28 12/29/2018 0757  BUN 16 12/29/2018 0757   CREATININE 0.70 12/29/2018 0757      Component Value Date/Time   CALCIUM 9.1 12/29/2018 0757   ALKPHOS 134 (H) 12/29/2018 0757   AST 28 12/29/2018 0757   ALT 37 12/29/2018 0757   BILITOT 0.5 12/29/2018 0757       Impression and Plan: Ms. 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  Clinically, she is done well.  She is responding.  As expected, she is responding well.  Her quality of life is doing nicely which is nice to see.  We will move ahead with her fourth cycle of treatment.  After 6 cycles, I will then rescan her and then we will consider maintenance therapy with Gazyva.  Again, I am just happy that she is improved so quickly from when we first saw her.  Her spleen was massive and this was basically taken up most of her abdomen and she cannot digest food her stomach cannot except food and she was miserable and losing weight quickly.  Thankfully, this is been turned around by treatment.    Volanda Napoleon, MD 1/8/20208:46 AM

## 2018-12-30 ENCOUNTER — Inpatient Hospital Stay: Payer: Medicare Other

## 2018-12-30 VITALS — BP 131/65 | HR 81 | Temp 96.0°F | Resp 17

## 2018-12-30 DIAGNOSIS — C8218 Follicular lymphoma grade II, lymph nodes of multiple sites: Secondary | ICD-10-CM

## 2018-12-30 DIAGNOSIS — Z5111 Encounter for antineoplastic chemotherapy: Secondary | ICD-10-CM | POA: Diagnosis not present

## 2018-12-30 DIAGNOSIS — C8300 Small cell B-cell lymphoma, unspecified site: Secondary | ICD-10-CM | POA: Diagnosis not present

## 2018-12-30 DIAGNOSIS — R161 Splenomegaly, not elsewhere classified: Secondary | ICD-10-CM | POA: Diagnosis not present

## 2018-12-30 LAB — IGG, IGA, IGM
IgA: 53 mg/dL — ABNORMAL LOW (ref 64–422)
IgG (Immunoglobin G), Serum: 341 mg/dL — ABNORMAL LOW (ref 700–1600)
IgM (Immunoglobulin M), Srm: 1812 mg/dL — ABNORMAL HIGH (ref 26–217)

## 2018-12-30 LAB — BETA 2 MICROGLOBULIN, SERUM: Beta-2 Microglobulin: 3.8 mg/L — ABNORMAL HIGH (ref 0.6–2.4)

## 2018-12-30 MED ORDER — DEXAMETHASONE SODIUM PHOSPHATE 10 MG/ML IJ SOLN
10.0000 mg | Freq: Once | INTRAMUSCULAR | Status: AC
  Administered 2018-12-30: 10 mg via INTRAVENOUS

## 2018-12-30 MED ORDER — DEXAMETHASONE SODIUM PHOSPHATE 10 MG/ML IJ SOLN
INTRAMUSCULAR | Status: AC
  Filled 2018-12-30: qty 1

## 2018-12-30 MED ORDER — SODIUM CHLORIDE 0.9 % IV SOLN
81.0000 mg/m2 | Freq: Once | INTRAVENOUS | Status: AC
  Administered 2018-12-30: 150 mg via INTRAVENOUS
  Filled 2018-12-30: qty 6

## 2018-12-30 MED ORDER — SODIUM CHLORIDE 0.9 % IV SOLN
Freq: Once | INTRAVENOUS | Status: AC
  Administered 2018-12-30: 08:00:00 via INTRAVENOUS
  Filled 2018-12-30: qty 250

## 2018-12-30 NOTE — Patient Instructions (Signed)
Bendamustine Injection What is this medicine? BENDAMUSTINE (BEN da MUS teen) is a chemotherapy drug. It is used to treat chronic lymphocytic leukemia and non-Hodgkin lymphoma. This medicine may be used for other purposes; ask your health care provider or pharmacist if you have questions. COMMON BRAND NAME(S): BENDEKA, Treanda What should I tell my health care provider before I take this medicine? They need to know if you have any of these conditions: -infection (especially a virus infection such as chickenpox, cold sores, or herpes) -kidney disease -liver disease -an unusual or allergic reaction to bendamustine, mannitol, other medicines, foods, dyes, or preservatives -pregnant or trying to get pregnant -breast-feeding How should I use this medicine? This medicine is for infusion into a vein. It is given by a health care professional in a hospital or clinic setting. Talk to your pediatrician regarding the use of this medicine in children. Special care may be needed. Overdosage: If you think you have taken too much of this medicine contact a poison control center or emergency room at once. NOTE: This medicine is only for you. Do not share this medicine with others. What if I miss a dose? It is important not to miss your dose. Call your doctor or health care professional if you are unable to keep an appointment. What may interact with this medicine? Do not take this medicine with any of the following medications: -clozapine This medicine may also interact with the following medications: -atazanavir -cimetidine -ciprofloxacin -enoxacin -fluvoxamine -medicines for seizures like carbamazepine and phenobarbital -mexiletine -rifampin -tacrine -thiabendazole -zileuton This list may not describe all possible interactions. Give your health care provider a list of all the medicines, herbs, non-prescription drugs, or dietary supplements you use. Also tell them if you smoke, drink alcohol, or  use illegal drugs. Some items may interact with your medicine. What should I watch for while using this medicine? This drug may make you feel generally unwell. This is not uncommon, as chemotherapy can affect healthy cells as well as cancer cells. Report any side effects. Continue your course of treatment even though you feel ill unless your doctor tells you to stop. You may need blood work done while you are taking this medicine. Call your doctor or health care professional for advice if you get a fever, chills or sore throat, or other symptoms of a cold or flu. Do not treat yourself. This drug decreases your body's ability to fight infections. Try to avoid being around people who are sick. This medicine may increase your risk to bruise or bleed. Call your doctor or health care professional if you notice any unusual bleeding. Talk to your doctor about your risk of cancer. You may be more at risk for certain types of cancers if you take this medicine. Do not become pregnant while taking this medicine or for 3 months after stopping it. Women should inform their doctor if they wish to become pregnant or think they might be pregnant. Men should not father a child while taking this medicine and for 3 months after stopping it.There is a potential for serious side effects to an unborn child. Talk to your health care professional or pharmacist for more information. Do not breast-feed an infant while taking this medicine. This medicine may interfere with the ability to have a child. You should talk with your doctor or health care professional if you are concerned about your fertility. What side effects may I notice from receiving this medicine? Side effects that you should report to your doctor  or health care professional as soon as possible: -allergic reactions like skin rash, itching or hives, swelling of the face, lips, or tongue -low blood counts - this medicine may decrease the number of white blood cells,  red blood cells and platelets. You may be at increased risk for infections and bleeding. -redness, blistering, peeling or loosening of the skin, including inside the mouth -signs of infection - fever or chills, cough, sore throat, pain or difficulty passing urine -signs of decreased platelets or bleeding - bruising, pinpoint red spots on the skin, black, tarry stools, blood in the urine -signs of decreased red blood cells - unusually weak or tired, fainting spells, lightheadedness -signs and symptoms of kidney injury like trouble passing urine or change in the amount of urine -signs and symptoms of liver injury like dark yellow or brown urine; general ill feeling or flu-like symptoms; light-colored stools; loss of appetite; nausea; right upper belly pain; unusually weak or tired; yellowing of the eyes or skin Side effects that usually do not require medical attention (report to your doctor or health care professional if they continue or are bothersome): -constipation -decreased appetite -diarrhea -headache -mouth sores -nausea/vomiting -tiredness This list may not describe all possible side effects. Call your doctor for medical advice about side effects. You may report side effects to FDA at 1-800-FDA-1088. Where should I keep my medicine? This drug is given in a hospital or clinic and will not be stored at home. NOTE: This sheet is a summary. It may not cover all possible information. If you have questions about this medicine, talk to your doctor, pharmacist, or health care provider.  2019 Elsevier/Gold Standard (2015-10-11 08:45:41) Dexamethasone injection What is this medicine? DEXAMETHASONE (dex a METH a sone) is a corticosteroid. It is used to treat inflammation of the skin, joints, lungs, and other organs. Common conditions treated include asthma, allergies, and arthritis. It is also used for other conditions, like blood disorders and diseases of the adrenal glands. This medicine may be  used for other purposes; ask your health care provider or pharmacist if you have questions. COMMON BRAND NAME(S): Decadron, DoubleDex, Simplist Dexamethasone, Solurex What should I tell my health care provider before I take this medicine? They need to know if you have any of these conditions: -blood clotting problems -Cushing's syndrome -diabetes -glaucoma -heart problems or disease -high blood pressure -infection like herpes, measles, tuberculosis, or chickenpox -kidney disease -liver disease -mental problems -myasthenia gravis -osteoporosis -previous heart attack -seizures -stomach, ulcer or intestine disease including colitis and diverticulitis -thyroid problem -an unusual or allergic reaction to dexamethasone, corticosteroids, other medicines, lactose, foods, dyes, or preservatives -pregnant or trying to get pregnant -breast-feeding How should I use this medicine? This medicine is for injection into a muscle, joint, lesion, soft tissue, or vein. It is given by a health care professional in a hospital or clinic setting. Talk to your pediatrician regarding the use of this medicine in children. Special care may be needed. Overdosage: If you think you have taken too much of this medicine contact a poison control center or emergency room at once. NOTE: This medicine is only for you. Do not share this medicine with others. What if I miss a dose? This may not apply. If you are having a series of injections over a prolonged period, try not to miss an appointment. Call your doctor or health care professional to reschedule if you are unable to keep an appointment. What may interact with this medicine? Do not take this  medicine with any of the following medications: -mifepristone, RU-486 -vaccines This medicine may also interact with the following medications: -amphotericin B -antibiotics like clarithromycin, erythromycin, and troleandomycin -aspirin and aspirin-like  drugs -barbiturates like phenobarbital -carbamazepine -cholestyramine -cholinesterase inhibitors like donepezil, galantamine, rivastigmine, and tacrine -cyclosporine -digoxin -diuretics -ephedrine -female hormones, like estrogens or progestins and birth control pills -indinavir -isoniazid -ketoconazole -medicines for diabetes -medicines that improve muscle tone or strength for conditions like myasthenia gravis -NSAIDs, medicines for pain and inflammation, like ibuprofen or naproxen -phenytoin -rifampin -thalidomide -warfarin This list may not describe all possible interactions. Give your health care provider a list of all the medicines, herbs, non-prescription drugs, or dietary supplements you use. Also tell them if you smoke, drink alcohol, or use illegal drugs. Some items may interact with your medicine. What should I watch for while using this medicine? Your condition will be monitored carefully while you are receiving this medicine. If you are taking this medicine for a long time, carry an identification card with your name and address, the type and dose of your medicine, and your doctor's name and address. This medicine may increase your risk of getting an infection. Stay away from people who are sick. Tell your doctor or health care professional if you are around anyone with measles or chickenpox. Talk to your health care provider before you get any vaccines that you take this medicine. If you are going to have surgery, tell your doctor or health care professional that you have taken this medicine within the last twelve months. Ask your doctor or health care professional about your diet. You may need to lower the amount of salt you eat. The medicine can increase your blood sugar. If you are a diabetic check with your doctor if you need help adjusting the dose of your diabetic medicine. What side effects may I notice from receiving this medicine? Side effects that you should report  to your doctor or health care professional as soon as possible: -allergic reactions like skin rash, itching or hives, swelling of the face, lips, or tongue -black or tarry stools -change in the amount of urine -changes in vision -confusion, excitement, restlessness, a false sense of well-being -fever, sore throat, sneezing, cough, or other signs of infection, wounds that will not heal -hallucinations -increased thirst -mental depression, mood swings, mistaken feelings of self importance or of being mistreated -pain in hips, back, ribs, arms, shoulders, or legs -pain, redness, or irritation at the injection site -redness, blistering, peeling or loosening of the skin, including inside the mouth -rounding out of face -swelling of feet or lower legs -unusual bleeding or bruising -unusual tired or weak -wounds that do not heal Side effects that usually do not require medical attention (report to your doctor or health care professional if they continue or are bothersome): -diarrhea or constipation -change in taste -headache -nausea, vomiting -skin problems, acne, thin and shiny skin -touble sleeping -unusual growth of hair on the face or body -weight gain This list may not describe all possible side effects. Call your doctor for medical advice about side effects. You may report side effects to FDA at 1-800-FDA-1088. Where should I keep my medicine? This drug is given in a hospital or clinic and will not be stored at home. NOTE: This sheet is a summary. It may not cover all possible information. If you have questions about this medicine, talk to your doctor, pharmacist, or health care provider.  2019 Elsevier/Gold Standard (2008-03-30 14:04:12)

## 2018-12-31 LAB — KAPPA/LAMBDA LIGHT CHAINS
Kappa free light chain: 34.8 mg/L — ABNORMAL HIGH (ref 3.3–19.4)
Kappa, lambda light chain ratio: 6 — ABNORMAL HIGH (ref 0.26–1.65)
Lambda free light chains: 5.8 mg/L (ref 5.7–26.3)

## 2019-01-26 ENCOUNTER — Inpatient Hospital Stay: Payer: Medicare Other

## 2019-01-26 ENCOUNTER — Inpatient Hospital Stay: Payer: Medicare Other | Attending: Hematology & Oncology | Admitting: Hematology & Oncology

## 2019-01-26 ENCOUNTER — Encounter: Payer: Self-pay | Admitting: Hematology & Oncology

## 2019-01-26 ENCOUNTER — Other Ambulatory Visit: Payer: Self-pay

## 2019-01-26 VITALS — BP 116/54 | HR 70 | Temp 98.4°F | Resp 20 | Wt 173.0 lb

## 2019-01-26 VITALS — BP 132/53 | HR 74 | Temp 98.2°F | Resp 18

## 2019-01-26 DIAGNOSIS — Z79899 Other long term (current) drug therapy: Secondary | ICD-10-CM

## 2019-01-26 DIAGNOSIS — Z5111 Encounter for antineoplastic chemotherapy: Secondary | ICD-10-CM | POA: Insufficient documentation

## 2019-01-26 DIAGNOSIS — R161 Splenomegaly, not elsewhere classified: Secondary | ICD-10-CM | POA: Insufficient documentation

## 2019-01-26 DIAGNOSIS — C8300 Small cell B-cell lymphoma, unspecified site: Secondary | ICD-10-CM

## 2019-01-26 DIAGNOSIS — E538 Deficiency of other specified B group vitamins: Secondary | ICD-10-CM

## 2019-01-26 DIAGNOSIS — D500 Iron deficiency anemia secondary to blood loss (chronic): Secondary | ICD-10-CM

## 2019-01-26 DIAGNOSIS — C8218 Follicular lymphoma grade II, lymph nodes of multiple sites: Secondary | ICD-10-CM

## 2019-01-26 LAB — CMP (CANCER CENTER ONLY)
ALT: 31 U/L (ref 0–44)
AST: 23 U/L (ref 15–41)
Albumin: 4.3 g/dL (ref 3.5–5.0)
Alkaline Phosphatase: 103 U/L (ref 38–126)
Anion gap: 7 (ref 5–15)
BILIRUBIN TOTAL: 0.6 mg/dL (ref 0.3–1.2)
BUN: 15 mg/dL (ref 8–23)
CO2: 28 mmol/L (ref 22–32)
Calcium: 9.6 mg/dL (ref 8.9–10.3)
Chloride: 95 mmol/L — ABNORMAL LOW (ref 98–111)
Creatinine: 0.68 mg/dL (ref 0.44–1.00)
GFR, Est AFR Am: >60 mL/min (ref >60)
GFR, Estimated: >60 mL/min (ref >60)
Glucose, Bld: 103 mg/dL — ABNORMAL HIGH (ref 70–99)
Potassium: 4.2 mmol/L (ref 3.5–5.1)
Sodium: 130 mmol/L — ABNORMAL LOW (ref 135–145)
TOTAL PROTEIN: 6.4 g/dL — AB (ref 6.5–8.1)

## 2019-01-26 LAB — CBC WITH DIFFERENTIAL (CANCER CENTER ONLY)
Abs Immature Granulocytes: 0.02 10*3/uL (ref 0.00–0.07)
Basophils Absolute: 0.1 10*3/uL (ref 0.0–0.1)
Basophils Relative: 1 %
Eosinophils Absolute: 0.1 10*3/uL (ref 0.0–0.5)
Eosinophils Relative: 2 %
HCT: 29.6 % — ABNORMAL LOW (ref 36.0–46.0)
Hemoglobin: 10.8 g/dL — ABNORMAL LOW (ref 12.0–15.0)
Immature Granulocytes: 1 %
LYMPHS ABS: 0.9 10*3/uL (ref 0.7–4.0)
Lymphocytes Relative: 23 %
MCH: 33 pg (ref 26.0–34.0)
MCHC: 36.5 g/dL — ABNORMAL HIGH (ref 30.0–36.0)
MCV: 90.5 fL (ref 80.0–100.0)
MONOS PCT: 14 %
Monocytes Absolute: 0.5 10*3/uL (ref 0.1–1.0)
Neutro Abs: 2.2 10*3/uL (ref 1.7–7.7)
Neutrophils Relative %: 59 %
Platelet Count: 96 10*3/uL — ABNORMAL LOW (ref 150–400)
RBC: 3.27 MIL/uL — ABNORMAL LOW (ref 3.87–5.11)
RDW: 13.8 % (ref 11.5–15.5)
WBC Count: 3.8 10*3/uL — ABNORMAL LOW (ref 4.0–10.5)
nRBC: 0 % (ref 0.0–0.2)

## 2019-01-26 LAB — LACTATE DEHYDROGENASE: LDH: 137 U/L (ref 98–192)

## 2019-01-26 MED ORDER — SODIUM CHLORIDE 0.9 % IV SOLN
81.0000 mg/m2 | Freq: Once | INTRAVENOUS | Status: AC
  Administered 2019-01-26: 150 mg via INTRAVENOUS
  Filled 2019-01-26: qty 6

## 2019-01-26 MED ORDER — PALONOSETRON HCL INJECTION 0.25 MG/5ML
0.2500 mg | Freq: Once | INTRAVENOUS | Status: AC
  Administered 2019-01-26: 0.25 mg via INTRAVENOUS

## 2019-01-26 MED ORDER — CYANOCOBALAMIN 1000 MCG/ML IJ SOLN
1000.0000 ug | Freq: Once | INTRAMUSCULAR | Status: AC
  Administered 2019-01-26: 1000 ug via INTRAMUSCULAR

## 2019-01-26 MED ORDER — DIPHENHYDRAMINE HCL 50 MG/ML IJ SOLN
50.0000 mg | Freq: Once | INTRAMUSCULAR | Status: AC
  Administered 2019-01-26: 50 mg via INTRAVENOUS

## 2019-01-26 MED ORDER — CYANOCOBALAMIN 1000 MCG/ML IJ SOLN
INTRAMUSCULAR | Status: AC
  Filled 2019-01-26: qty 1

## 2019-01-26 MED ORDER — ACETAMINOPHEN 325 MG PO TABS
ORAL_TABLET | ORAL | Status: AC
  Filled 2019-01-26: qty 2

## 2019-01-26 MED ORDER — SODIUM CHLORIDE 0.9 % IV SOLN
20.0000 mg | Freq: Once | INTRAVENOUS | Status: AC
  Administered 2019-01-26: 20 mg via INTRAVENOUS
  Filled 2019-01-26: qty 2

## 2019-01-26 MED ORDER — PALONOSETRON HCL INJECTION 0.25 MG/5ML
INTRAVENOUS | Status: AC
  Filled 2019-01-26: qty 5

## 2019-01-26 MED ORDER — SODIUM CHLORIDE 0.9 % IV SOLN
1000.0000 mg | Freq: Once | INTRAVENOUS | Status: AC
  Administered 2019-01-26: 1000 mg via INTRAVENOUS
  Filled 2019-01-26: qty 40

## 2019-01-26 MED ORDER — DIPHENHYDRAMINE HCL 50 MG/ML IJ SOLN
INTRAMUSCULAR | Status: AC
  Filled 2019-01-26: qty 1

## 2019-01-26 MED ORDER — ACETAMINOPHEN 325 MG PO TABS
650.0000 mg | ORAL_TABLET | Freq: Once | ORAL | Status: AC
  Administered 2019-01-26: 650 mg via ORAL

## 2019-01-26 MED ORDER — SODIUM CHLORIDE 0.9 % IV SOLN
Freq: Once | INTRAVENOUS | Status: AC
  Administered 2019-01-26: 10:00:00 via INTRAVENOUS
  Filled 2019-01-26: qty 250

## 2019-01-26 NOTE — Progress Notes (Signed)
Hematology and Oncology Follow Up Visit  Loyd Salvador [id number] 04-02-1941 78 y.o. 01/26/2019   Principle Diagnosis:  Follicular lymphoplasmacytic B-cell Non-Hodgkin's Lymphoma  Current Therapy:   Gazyva/Bendamustine -  S/p cycle #4   Interim History: Ms. Nottingham is here today for follow-up and treatment.  She seems to be going pretty well.  I think she has tolerated treatment incredibly well.  I am happy that her quality of life is doing better.  A lot of this has to do with the fact that her spleen has decreased in size quite a bit.  She is had no fever.  She is had no cough.  There is been no change in bowel or bladder habits.  There is been no nausea or vomiting.  She has had no leg swelling.  With her last visit, her M spike was 0.6 g/dL.  Her Ig M level was 1812 mg/dL.  She has had no mouth sores.  There is been no headache.  Overall, her performance status is ECOG 1.   Medications:  Allergies as of 01/26/2019      Reactions   Aspirin    REACTION: pt states that it irritates her stomach, coated ASA is ok      Medication List       Accurate as of January 26, 2019  9:08 AM. Always use your most recent med list.        allopurinol 100 MG tablet Commonly known as:  ZYLOPRIM TAKE 1 TABLET BY MOUTH EVERY DAY   amoxicillin 500 MG capsule Commonly known as:  AMOXIL Take 2,000 mg by mouth as needed. only for dental procedeure   dexamethasone 4 MG tablet Commonly known as:  DECADRON Take 2 tablets (8 mg total) by mouth daily. Start the day after chemotherapy for 2 days. Take with food.   famciclovir 250 MG tablet Commonly known as:  FAMVIR Take 1 tablet (250 mg total) by mouth daily.   levothyroxine 75 MCG tablet Commonly known as:  SYNTHROID, LEVOTHROID TAKE 1 TABLET BY MOUTH EVERY DAY   lisinopril-hydrochlorothiazide 10-12.5 MG tablet Commonly known as:  PRINZIDE,ZESTORETIC Take 1 tablet by mouth daily.   LORazepam 0.5 MG tablet Commonly known as:   ATIVAN Take 1 tablet (0.5 mg total) by mouth every 6 (six) hours as needed (Nausea or vomiting).   montelukast 10 MG tablet Commonly known as:  SINGULAIR Take 1 tablet (10 mg total) by mouth at bedtime.   ondansetron 8 MG tablet Commonly known as:  ZOFRAN Take 1 tablet (8 mg total) by mouth 2 (two) times daily. Start second day after chemotherapy. Then take as needed for nausea or vomiting.   prochlorperazine 10 MG tablet Commonly known as:  COMPAZINE Take 1 tablet (10 mg total) by mouth every 6 (six) hours as needed (Nausea or vomiting).   Vitamin D 50 MCG (2000 UT) Caps Take 1 capsule by mouth daily.       Allergies:  Allergies  Allergen Reactions  . Aspirin     REACTION: pt states that it irritates her stomach, coated ASA is ok    Past Medical History, Surgical history, Social history, and Family History were reviewed and updated.  Review of Systems: Review of Systems  Constitutional: Negative.   HENT: Negative.   Eyes: Negative.   Respiratory: Negative.   Cardiovascular: Negative.   Gastrointestinal: Negative.   Genitourinary: Negative.   Musculoskeletal: Negative.   Skin: Negative.   Neurological: Negative.   Endo/Heme/Allergies: Negative.   Psychiatric/Behavioral:  Negative.      Physical Exam:  weight is 173 lb (78.5 kg). Her oral temperature is 98.4 F (36.9 C). Her blood pressure is 116/54 (abnormal) and her pulse is 70. Her respiration is 20 and oxygen saturation is 100%.   Wt Readings from Last 3 Encounters:  01/26/19 173 lb (78.5 kg)  12/29/18 168 lb 8 oz (76.4 kg)  11/03/18 159 lb (72.1 kg)    Physical Exam Vitals signs reviewed.  HENT:     Head: Normocephalic and atraumatic.  Eyes:     Pupils: Pupils are equal, round, and reactive to light.  Neck:     Musculoskeletal: Normal range of motion.  Cardiovascular:     Rate and Rhythm: Normal rate and regular rhythm.     Heart sounds: Normal heart sounds.  Pulmonary:     Effort: Pulmonary  effort is normal.     Breath sounds: Normal breath sounds.  Abdominal:     General: Bowel sounds are normal.     Palpations: Abdomen is soft.     Comments: Abdominal exam shows a soft abdomen.  Bowel sounds are present.  There is no fluid wave.  There is no inguinal adenopathy.  There is no hepatomegaly.  Her spleen has shrunk down quite nicely.  Her spleen is now about 4 cm below the left costal margin.  Musculoskeletal: Normal range of motion.        General: No tenderness or deformity.  Lymphadenopathy:     Cervical: No cervical adenopathy.  Skin:    General: Skin is warm and dry.     Findings: No erythema or rash.  Neurological:     Mental Status: She is alert and oriented to person, place, and time.  Psychiatric:        Behavior: Behavior normal.        Thought Content: Thought content normal.        Judgment: Judgment normal.      Lab Results  Component Value Date   WBC 3.8 (L) 01/26/2019   HGB 10.8 (L) 01/26/2019   HCT 29.6 (L) 01/26/2019   MCV 90.5 01/26/2019   PLT 96 (L) 01/26/2019   Lab Results  Component Value Date   FERRITIN 849 (H) 11/03/2018   IRON 100 11/03/2018   TIBC 281 11/03/2018   UIBC 182 11/03/2018   IRONPCTSAT 35 11/03/2018   Lab Results  Component Value Date   RETICCTPCT 2.0 11/03/2018   RBC 3.27 (L) 01/26/2019   RETICCTABS 105,600 (H) 03/03/2018   Lab Results  Component Value Date   KPAFRELGTCHN 34.8 (H) 12/29/2018   LAMBDASER 5.8 12/29/2018   KAPLAMBRATIO 6.00 (H) 12/29/2018   Lab Results  Component Value Date   IGGSERUM 341 (L) 12/29/2018   IGA 53 (L) 12/29/2018   IGMSERUM 1,812 (H) 12/29/2018   Lab Results  Component Value Date   TOTALPROTELP 6.1 12/01/2018   ALBUMINELP 3.5 12/01/2018   A1GS 0.2 12/01/2018   A2GS 0.6 12/01/2018   BETS 1.0 12/01/2018   GAMS 0.9 12/01/2018   MSPIKE 0.6 (H) 12/01/2018   SPEI Comment 11/03/2018     Chemistry      Component Value Date/Time   NA 130 (L) 01/26/2019 0806   K 4.2 01/26/2019  0806   CL 95 (L) 01/26/2019 0806   CO2 28 01/26/2019 0806   BUN 15 01/26/2019 0806   CREATININE 0.68 01/26/2019 0806      Component Value Date/Time   CALCIUM 9.6 01/26/2019 0806   ALKPHOS  103 01/26/2019 0806   AST 23 01/26/2019 0806   ALT 31 01/26/2019 0806   BILITOT 0.6 01/26/2019 0806       Impression and Plan: Ms. 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  Clinically, she is done well.  She is responding.  As expected, she is responding well.  Her quality of life is doing nicely which is nice to see.  We will move ahead with her fifth cycle of treatment.  After 6 cycles, I will then rescan her and then we will consider maintenance therapy with Gazyva.  Again, I am just happy that she is improved so quickly from when we first saw her.  Her spleen was massive and this was basically taken up most of her abdomen and she cannot digest food her stomach cannot except food and she was miserable and losing weight quickly.  Thankfully, this is been turned around by treatment.    Volanda Napoleon, MD 2/5/20209:08 AM

## 2019-01-26 NOTE — Patient Instructions (Signed)
[place] Discharge Instructions for Patients Receiving Chemotherapy  Today you received the following chemotherapy agents gazyva, bendeka.  To help prevent nausea and vomiting after your treatment, we encourage you to take your nausea medication as indicated by your MD.   If you develop nausea and vomiting that is not controlled by your nausea medication, call the clinic.   BELOW ARE SYMPTOMS THAT SHOULD BE REPORTED IMMEDIATELY:  *FEVER GREATER THAN 100.5 F  *CHILLS WITH OR WITHOUT FEVER  NAUSEA AND VOMITING THAT IS NOT CONTROLLED WITH YOUR NAUSEA MEDICATION  *UNUSUAL SHORTNESS OF BREATH  *UNUSUAL BRUISING OR BLEEDING  TENDERNESS IN MOUTH AND THROAT WITH OR WITHOUT PRESENCE OF ULCERS  *URINARY PROBLEMS  *BOWEL PROBLEMS  UNUSUAL RASH Items with * indicate a potential emergency and should be followed up as soon as possible.  Feel free to call the clinic should you have any questions or concerns. The clinic phone number is [phone].  Please show the [place] at check-in to the Emergency Department and triage nurse.

## 2019-01-26 NOTE — Progress Notes (Signed)
Okay to treat with pltc = 96 per Dr. Marin Olp.

## 2019-01-27 ENCOUNTER — Inpatient Hospital Stay: Payer: Medicare Other

## 2019-01-27 VITALS — BP 123/66 | HR 79 | Temp 97.6°F | Resp 18

## 2019-01-27 DIAGNOSIS — Z79899 Other long term (current) drug therapy: Secondary | ICD-10-CM | POA: Diagnosis not present

## 2019-01-27 DIAGNOSIS — C8300 Small cell B-cell lymphoma, unspecified site: Secondary | ICD-10-CM | POA: Diagnosis not present

## 2019-01-27 DIAGNOSIS — C8218 Follicular lymphoma grade II, lymph nodes of multiple sites: Secondary | ICD-10-CM

## 2019-01-27 DIAGNOSIS — R161 Splenomegaly, not elsewhere classified: Secondary | ICD-10-CM | POA: Diagnosis not present

## 2019-01-27 DIAGNOSIS — Z5111 Encounter for antineoplastic chemotherapy: Secondary | ICD-10-CM | POA: Diagnosis not present

## 2019-01-27 LAB — IGG, IGA, IGM
IgA: 51 mg/dL — ABNORMAL LOW (ref 64–422)
IgG (Immunoglobin G), Serum: 328 mg/dL — ABNORMAL LOW (ref 700–1600)
IgM (Immunoglobulin M), Srm: 1616 mg/dL — ABNORMAL HIGH (ref 26–217)

## 2019-01-27 LAB — KAPPA/LAMBDA LIGHT CHAINS
KAPPA, LAMDA LIGHT CHAIN RATIO: 12.3 — AB (ref 0.26–1.65)
Kappa free light chain: 49.2 mg/L — ABNORMAL HIGH (ref 3.3–19.4)
Lambda free light chains: 4 mg/L — ABNORMAL LOW (ref 5.7–26.3)

## 2019-01-27 LAB — BETA 2 MICROGLOBULIN, SERUM: Beta-2 Microglobulin: 3.3 mg/L — ABNORMAL HIGH (ref 0.6–2.4)

## 2019-01-27 MED ORDER — HEPARIN SOD (PORK) LOCK FLUSH 100 UNIT/ML IV SOLN
500.0000 [IU] | Freq: Once | INTRAVENOUS | Status: DC | PRN
  Filled 2019-01-27: qty 5

## 2019-01-27 MED ORDER — SODIUM CHLORIDE 0.9 % IV SOLN
81.0000 mg/m2 | Freq: Once | INTRAVENOUS | Status: AC
  Administered 2019-01-27: 150 mg via INTRAVENOUS
  Filled 2019-01-27: qty 6

## 2019-01-27 MED ORDER — SODIUM CHLORIDE 0.9% FLUSH
10.0000 mL | INTRAVENOUS | Status: DC | PRN
  Filled 2019-01-27: qty 10

## 2019-01-27 MED ORDER — SODIUM CHLORIDE 0.9 % IV SOLN
Freq: Once | INTRAVENOUS | Status: AC
  Administered 2019-01-27: 09:00:00 via INTRAVENOUS
  Filled 2019-01-27: qty 250

## 2019-01-27 MED ORDER — DEXAMETHASONE SODIUM PHOSPHATE 10 MG/ML IJ SOLN
INTRAMUSCULAR | Status: AC
  Filled 2019-01-27: qty 1

## 2019-01-27 MED ORDER — DEXAMETHASONE SODIUM PHOSPHATE 10 MG/ML IJ SOLN
10.0000 mg | Freq: Once | INTRAMUSCULAR | Status: AC
  Administered 2019-01-27: 10 mg via INTRAVENOUS

## 2019-01-27 NOTE — Patient Instructions (Signed)
[place] Discharge Instructions for Patients Receiving Chemotherapy  Today you received the following chemotherapy agents:  Bendeka  To help prevent nausea and vomiting after your treatment, we encourage you to take your nausea medication as ordered per MD.    If you develop nausea and vomiting that is not controlled by your nausea medication, call the clinic.   BELOW ARE SYMPTOMS THAT SHOULD BE REPORTED IMMEDIATELY:  *FEVER GREATER THAN 100.5 F  *CHILLS WITH OR WITHOUT FEVER  NAUSEA AND VOMITING THAT IS NOT CONTROLLED WITH YOUR NAUSEA MEDICATION  *UNUSUAL SHORTNESS OF BREATH  *UNUSUAL BRUISING OR BLEEDING  TENDERNESS IN MOUTH AND THROAT WITH OR WITHOUT PRESENCE OF ULCERS  *URINARY PROBLEMS  *BOWEL PROBLEMS  UNUSUAL RASH Items with * indicate a potential emergency and should be followed up as soon as possible.  Feel free to call the clinic should you have any questions or concerns. The clinic phone number is [phone].  Please show the [place] at check-in to the Emergency Department and triage nurse.

## 2019-01-31 LAB — PROTEIN ELECTROPHORESIS, SERUM, WITH REFLEX
A/G RATIO SPE: 1.5 (ref 0.7–1.7)
Albumin ELP: 3.9 g/dL (ref 2.9–4.4)
Alpha-1-Globulin: 0.2 g/dL (ref 0.0–0.4)
Alpha-2-Globulin: 0.5 g/dL (ref 0.4–1.0)
BETA GLOBULIN: 1 g/dL (ref 0.7–1.3)
Gamma Globulin: 0.8 g/dL (ref 0.4–1.8)
Globulin, Total: 2.6 g/dL (ref 2.2–3.9)
M-Spike, %: 0.5 g/dL — ABNORMAL HIGH
SPEP Interpretation: 0
Total Protein ELP: 6.5 g/dL (ref 6.0–8.5)

## 2019-01-31 LAB — IMMUNOFIXATION REFLEX, SERUM
IGM (IMMUNOGLOBULIN M), SRM: 1845 mg/dL — AB (ref 26–217)
IgA: 54 mg/dL — ABNORMAL LOW (ref 64–422)
IgG (Immunoglobin G), Serum: 380 mg/dL — ABNORMAL LOW (ref 700–1600)

## 2019-02-07 ENCOUNTER — Other Ambulatory Visit: Payer: Self-pay | Admitting: Hematology & Oncology

## 2019-02-23 ENCOUNTER — Inpatient Hospital Stay: Payer: Medicare Other

## 2019-02-23 ENCOUNTER — Inpatient Hospital Stay: Payer: Medicare Other | Attending: Hematology & Oncology | Admitting: Hematology & Oncology

## 2019-02-23 ENCOUNTER — Other Ambulatory Visit: Payer: Self-pay

## 2019-02-23 ENCOUNTER — Encounter: Payer: Self-pay | Admitting: Hematology & Oncology

## 2019-02-23 VITALS — BP 142/54 | HR 69 | Temp 98.0°F | Resp 20 | Wt 178.8 lb

## 2019-02-23 VITALS — BP 121/53 | HR 80 | Temp 98.4°F | Resp 18

## 2019-02-23 DIAGNOSIS — C8218 Follicular lymphoma grade II, lymph nodes of multiple sites: Secondary | ICD-10-CM

## 2019-02-23 DIAGNOSIS — Z5111 Encounter for antineoplastic chemotherapy: Secondary | ICD-10-CM | POA: Insufficient documentation

## 2019-02-23 DIAGNOSIS — C8280 Other types of follicular lymphoma, unspecified site: Secondary | ICD-10-CM | POA: Insufficient documentation

## 2019-02-23 DIAGNOSIS — C8300 Small cell B-cell lymphoma, unspecified site: Secondary | ICD-10-CM

## 2019-02-23 DIAGNOSIS — Z5112 Encounter for antineoplastic immunotherapy: Secondary | ICD-10-CM | POA: Insufficient documentation

## 2019-02-23 DIAGNOSIS — Z79899 Other long term (current) drug therapy: Secondary | ICD-10-CM | POA: Insufficient documentation

## 2019-02-23 DIAGNOSIS — Z95828 Presence of other vascular implants and grafts: Secondary | ICD-10-CM

## 2019-02-23 LAB — CBC WITH DIFFERENTIAL (CANCER CENTER ONLY)
Abs Immature Granulocytes: 0.01 10*3/uL (ref 0.00–0.07)
Basophils Absolute: 0 10*3/uL (ref 0.0–0.1)
Basophils Relative: 1 %
Eosinophils Absolute: 0.1 10*3/uL (ref 0.0–0.5)
Eosinophils Relative: 4 %
HCT: 31.5 % — ABNORMAL LOW (ref 36.0–46.0)
Hemoglobin: 10.9 g/dL — ABNORMAL LOW (ref 12.0–15.0)
Immature Granulocytes: 0 %
Lymphocytes Relative: 20 %
Lymphs Abs: 0.7 10*3/uL (ref 0.7–4.0)
MCH: 32.4 pg (ref 26.0–34.0)
MCHC: 34.6 g/dL (ref 30.0–36.0)
MCV: 93.8 fL (ref 80.0–100.0)
Monocytes Absolute: 0.5 10*3/uL (ref 0.1–1.0)
Monocytes Relative: 13 %
NEUTROS ABS: 2.2 10*3/uL (ref 1.7–7.7)
Neutrophils Relative %: 62 %
PLATELETS: 89 10*3/uL — AB (ref 150–400)
RBC: 3.36 MIL/uL — ABNORMAL LOW (ref 3.87–5.11)
RDW: 14.3 % (ref 11.5–15.5)
WBC Count: 3.5 10*3/uL — ABNORMAL LOW (ref 4.0–10.5)
nRBC: 0 % (ref 0.0–0.2)

## 2019-02-23 LAB — CMP (CANCER CENTER ONLY)
ALT: 30 U/L (ref 0–44)
AST: 23 U/L (ref 15–41)
Albumin: 4.4 g/dL (ref 3.5–5.0)
Alkaline Phosphatase: 89 U/L (ref 38–126)
Anion gap: 7 (ref 5–15)
BUN: 18 mg/dL (ref 8–23)
CO2: 28 mmol/L (ref 22–32)
CREATININE: 0.74 mg/dL (ref 0.44–1.00)
Calcium: 9.5 mg/dL (ref 8.9–10.3)
Chloride: 98 mmol/L (ref 98–111)
GFR, Estimated: >60 mL/min (ref >60)
Glucose, Bld: 104 mg/dL — ABNORMAL HIGH (ref 70–99)
Potassium: 4.2 mmol/L (ref 3.5–5.1)
Sodium: 133 mmol/L — ABNORMAL LOW (ref 135–145)
Total Bilirubin: 0.6 mg/dL (ref 0.3–1.2)
Total Protein: 6.8 g/dL (ref 6.5–8.1)

## 2019-02-23 LAB — LACTATE DEHYDROGENASE: LDH: 129 U/L (ref 98–192)

## 2019-02-23 MED ORDER — SODIUM CHLORIDE 0.9 % IV SOLN
1000.0000 mg | Freq: Once | INTRAVENOUS | Status: AC
  Administered 2019-02-23: 1000 mg via INTRAVENOUS
  Filled 2019-02-23: qty 40

## 2019-02-23 MED ORDER — DIPHENHYDRAMINE HCL 50 MG/ML IJ SOLN
50.0000 mg | Freq: Once | INTRAMUSCULAR | Status: AC
  Administered 2019-02-23: 50 mg via INTRAVENOUS

## 2019-02-23 MED ORDER — SODIUM CHLORIDE 0.9% FLUSH
10.0000 mL | INTRAVENOUS | Status: DC | PRN
  Filled 2019-02-23: qty 10

## 2019-02-23 MED ORDER — PALONOSETRON HCL INJECTION 0.25 MG/5ML
INTRAVENOUS | Status: AC
  Filled 2019-02-23: qty 5

## 2019-02-23 MED ORDER — SODIUM CHLORIDE 0.9 % IV SOLN
20.0000 mg | Freq: Once | INTRAVENOUS | Status: AC
  Administered 2019-02-23: 20 mg via INTRAVENOUS
  Filled 2019-02-23: qty 2

## 2019-02-23 MED ORDER — ACETAMINOPHEN 325 MG PO TABS
ORAL_TABLET | ORAL | Status: AC
  Filled 2019-02-23: qty 2

## 2019-02-23 MED ORDER — HEPARIN SOD (PORK) LOCK FLUSH 100 UNIT/ML IV SOLN
500.0000 [IU] | Freq: Once | INTRAVENOUS | Status: DC
  Filled 2019-02-23: qty 5

## 2019-02-23 MED ORDER — SODIUM CHLORIDE 0.9 % IV SOLN
Freq: Once | INTRAVENOUS | Status: AC
  Administered 2019-02-23: 10:00:00 via INTRAVENOUS
  Filled 2019-02-23: qty 250

## 2019-02-23 MED ORDER — SODIUM CHLORIDE 0.9 % IV SOLN
81.0000 mg/m2 | Freq: Once | INTRAVENOUS | Status: AC
  Administered 2019-02-23: 150 mg via INTRAVENOUS
  Filled 2019-02-23: qty 6

## 2019-02-23 MED ORDER — DIPHENHYDRAMINE HCL 50 MG/ML IJ SOLN
INTRAMUSCULAR | Status: AC
  Filled 2019-02-23: qty 1

## 2019-02-23 MED ORDER — ACETAMINOPHEN 325 MG PO TABS
650.0000 mg | ORAL_TABLET | Freq: Once | ORAL | Status: AC
  Administered 2019-02-23: 650 mg via ORAL

## 2019-02-23 MED ORDER — PALONOSETRON HCL INJECTION 0.25 MG/5ML
0.2500 mg | Freq: Once | INTRAVENOUS | Status: AC
  Administered 2019-02-23: 0.25 mg via INTRAVENOUS

## 2019-02-23 NOTE — Patient Instructions (Signed)
[place] Discharge Instructions for Patients Receiving Chemotherapy  Today you received the following chemotherapy agents gazyva, bendeka. To help prevent nausea and vomiting after your treatment, we encourage you to take your nausea medication as indicated by your MD.   If you develop nausea and vomiting that is not controlled by your nausea medication, call the clinic.   BELOW ARE SYMPTOMS THAT SHOULD BE REPORTED IMMEDIATELY:  *FEVER GREATER THAN 100.5 F  *CHILLS WITH OR WITHOUT FEVER  NAUSEA AND VOMITING THAT IS NOT CONTROLLED WITH YOUR NAUSEA MEDICATION  *UNUSUAL SHORTNESS OF BREATH  *UNUSUAL BRUISING OR BLEEDING  TENDERNESS IN MOUTH AND THROAT WITH OR WITHOUT PRESENCE OF ULCERS  *URINARY PROBLEMS  *BOWEL PROBLEMS  UNUSUAL RASH Items with * indicate a potential emergency and should be followed up as soon as possible.  Feel free to call the clinic should you have any questions or concerns. The clinic phone number is [phone].  Please show the [place] at check-in to the Emergency Department and triage nurse.

## 2019-02-23 NOTE — Progress Notes (Signed)
Hematology and Oncology Follow Up Visit  Kristina Ramos [id number] [birth year] 78 y.o. 02/23/2019   Principle Diagnosis:  Follicular lymphoplasmacytic B-cell Non-Hodgkin's Lymphoma  Current Therapy:   Gazyva/Bendamustine -  S/p cycle #5   Interim History: Kristina Ramos is here today for follow-up and treatment.  As always, she is doing quite well.  She really has had no complaints.  She is gaining quite a bit of weight.  I think this is a very good sign that she has responded to treatment.  There is no abdominal pain.  She has had no nausea or vomiting.  She has had no fever.  She has had no rashes.  She has had no leg swelling.  We last saw her in February, her M spike was down to 0.5 g/dL.  Her IgM level was 1616 mg/dL.    This will be a final cycle of treatment.  After this, I will put her on maintenance therapy with Gazyva.  Overall, her performance status is ECOG 1.  Medications:  Allergies as of 02/23/2019      Reactions   Aspirin    REACTION: pt states that it irritates her stomach, coated ASA is ok      Medication List       Accurate as of February 23, 2019  9:33 AM. Always use your most recent med list.        allopurinol 100 MG tablet Commonly known as:  ZYLOPRIM TAKE 1 TABLET BY MOUTH EVERY DAY   amoxicillin 500 MG capsule Commonly known as:  AMOXIL Take 2,000 mg by mouth as needed. only for dental procedeure   dexamethasone 4 MG tablet Commonly known as:  DECADRON Take 2 tablets (8 mg total) by mouth daily. Start the day after chemotherapy for 2 days. Take with food.   famciclovir 250 MG tablet Commonly known as:  FAMVIR Take 1 tablet (250 mg total) by mouth daily.   levothyroxine 75 MCG tablet Commonly known as:  SYNTHROID, LEVOTHROID TAKE 1 TABLET BY MOUTH EVERY DAY   lisinopril-hydrochlorothiazide 10-12.5 MG tablet Commonly known as:  PRINZIDE,ZESTORETIC Take 1 tablet by mouth daily.   LORazepam 0.5 MG tablet Commonly known as:  ATIVAN Take 1 tablet (0.5  mg total) by mouth every 6 (six) hours as needed (Nausea or vomiting).   montelukast 10 MG tablet Commonly known as:  SINGULAIR TAKE 1 TABLET BY MOUTH EVERYDAY AT BEDTIME   ondansetron 8 MG tablet Commonly known as:  ZOFRAN Take 1 tablet (8 mg total) by mouth 2 (two) times daily. Start second day after chemotherapy. Then take as needed for nausea or vomiting.   prochlorperazine 10 MG tablet Commonly known as:  COMPAZINE Take 1 tablet (10 mg total) by mouth every 6 (six) hours as needed (Nausea or vomiting).   Vitamin D 50 MCG (2000 UT) Caps Take 1 capsule by mouth daily.       Allergies:  Allergies  Allergen Reactions  . Aspirin     REACTION: pt states that it irritates her stomach, coated ASA is ok    Past Medical History, Surgical history, Social history, and Family History were reviewed and updated.  Review of Systems: Review of Systems  Constitutional: Negative.   HENT: Negative.   Eyes: Negative.   Respiratory: Negative.   Cardiovascular: Negative.   Gastrointestinal: Negative.   Genitourinary: Negative.   Musculoskeletal: Negative.   Skin: Negative.   Neurological: Negative.   Endo/Heme/Allergies: Negative.   Psychiatric/Behavioral: Negative.  Physical Exam:  weight is 178 lb 12.8 oz (81.1 kg). Her oral temperature is 98 F (36.7 C). Her blood pressure is 142/54 (abnormal) and her pulse is 69. Her respiration is 20 and oxygen saturation is 100%.   Wt Readings from Last 3 Encounters:  02/23/19 178 lb 12.8 oz (81.1 kg)  01/26/19 173 lb (78.5 kg)  12/29/18 168 lb 8 oz (76.4 kg)    Physical Exam Vitals signs reviewed.  HENT:     Head: Normocephalic and atraumatic.  Eyes:     Pupils: Pupils are equal, round, and reactive to light.  Neck:     Musculoskeletal: Normal range of motion.  Cardiovascular:     Rate and Rhythm: Normal rate and regular rhythm.     Heart sounds: Normal heart sounds.  Pulmonary:     Effort: Pulmonary effort is normal.      Breath sounds: Normal breath sounds.  Abdominal:     General: Bowel sounds are normal.     Palpations: Abdomen is soft.     Comments: Abdominal exam shows a soft abdomen.  Bowel sounds are present.  There is no fluid wave.  There is no inguinal adenopathy.  There is no hepatomegaly.  Her spleen has shrunk down quite nicely.  Her spleen is now about 4 cm below the left costal margin.  Musculoskeletal: Normal range of motion.        General: No tenderness or deformity.  Lymphadenopathy:     Cervical: No cervical adenopathy.  Skin:    General: Skin is warm and dry.     Findings: No erythema or rash.  Neurological:     Mental Status: She is alert and oriented to person, place, and time.  Psychiatric:        Behavior: Behavior normal.        Thought Content: Thought content normal.        Judgment: Judgment normal.      Lab Results  Component Value Date   WBC 3.5 (L) 02/23/2019   HGB 10.9 (L) 02/23/2019   HCT 31.5 (L) 02/23/2019   MCV 93.8 02/23/2019   PLT 89 (L) 02/23/2019   Lab Results  Component Value Date   FERRITIN 849 (H) 11/03/2018   IRON 100 11/03/2018   TIBC 281 11/03/2018   UIBC 182 11/03/2018   IRONPCTSAT 35 11/03/2018   Lab Results  Component Value Date   RETICCTPCT 2.0 11/03/2018   RBC 3.36 (L) 02/23/2019   RETICCTABS 105,600 (H) 03/03/2018   Lab Results  Component Value Date   KPAFRELGTCHN 49.2 (H) 01/26/2019   LAMBDASER 4.0 (L) 01/26/2019   KAPLAMBRATIO 12.30 (H) 01/26/2019   Lab Results  Component Value Date   IGGSERUM 380 (L) 01/26/2019   IGA 54 (L) 01/26/2019   IGMSERUM 1,845 (H) 01/26/2019   Lab Results  Component Value Date   TOTALPROTELP 6.5 01/26/2019   ALBUMINELP 3.9 01/26/2019   A1GS 0.2 01/26/2019   A2GS 0.5 01/26/2019   BETS 1.0 01/26/2019   GAMS 0.8 01/26/2019   MSPIKE 0.5 (H) 01/26/2019   SPEI Comment 11/03/2018     Chemistry      Component Value Date/Time   NA 130 (L) 01/26/2019 0806   K 4.2 01/26/2019 0806   CL 95 (L)  01/26/2019 0806   CO2 28 01/26/2019 0806   BUN 15 01/26/2019 0806   CREATININE 0.68 01/26/2019 0806      Component Value Date/Time   CALCIUM 9.6 01/26/2019 0806   ALKPHOS 103  01/26/2019 0806   AST 23 01/26/2019 0806   ALT 31 01/26/2019 0806   BILITOT 0.6 01/26/2019 0806       Impression and Plan: Kristina Ramos is a very pleasant 78 yo caucasian female with 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  Clinically, she is done well.  She is responding.  We will go ahead and get scans on her.  We will go ahead with our CT scans.  I think this would be reasonable.  Hopefully, we will see a continued response.  We will see what her monoclonal protein studies show.  We will get our scans in about a month.  I will see her back in 6 weeks.  We will then start her on Gazyva maintenance therapy.      Volanda Napoleon, MD 3/4/20209:33 AM

## 2019-02-23 NOTE — Progress Notes (Signed)
Okay to treat today with pltc = 89 per Dr. Marin Olp.

## 2019-02-24 ENCOUNTER — Other Ambulatory Visit: Payer: Self-pay

## 2019-02-24 ENCOUNTER — Inpatient Hospital Stay: Payer: Medicare Other

## 2019-02-24 VITALS — BP 111/59 | HR 81 | Temp 98.3°F | Resp 18

## 2019-02-24 DIAGNOSIS — Z79899 Other long term (current) drug therapy: Secondary | ICD-10-CM | POA: Diagnosis not present

## 2019-02-24 DIAGNOSIS — Z5111 Encounter for antineoplastic chemotherapy: Secondary | ICD-10-CM | POA: Diagnosis not present

## 2019-02-24 DIAGNOSIS — Z5112 Encounter for antineoplastic immunotherapy: Secondary | ICD-10-CM | POA: Diagnosis not present

## 2019-02-24 DIAGNOSIS — C8280 Other types of follicular lymphoma, unspecified site: Secondary | ICD-10-CM | POA: Diagnosis not present

## 2019-02-24 DIAGNOSIS — C8218 Follicular lymphoma grade II, lymph nodes of multiple sites: Secondary | ICD-10-CM

## 2019-02-24 LAB — KAPPA/LAMBDA LIGHT CHAINS
Kappa free light chain: 46.5 mg/L — ABNORMAL HIGH (ref 3.3–19.4)
Kappa, lambda light chain ratio: 12.57 — ABNORMAL HIGH (ref 0.26–1.65)
Lambda free light chains: 3.7 mg/L — ABNORMAL LOW (ref 5.7–26.3)

## 2019-02-24 LAB — IGG, IGA, IGM
IgA: 54 mg/dL — ABNORMAL LOW (ref 64–422)
IgG (Immunoglobin G), Serum: 377 mg/dL — ABNORMAL LOW (ref 700–1600)
IgM (Immunoglobulin M), Srm: 1381 mg/dL — ABNORMAL HIGH (ref 26–217)

## 2019-02-24 MED ORDER — DEXAMETHASONE SODIUM PHOSPHATE 10 MG/ML IJ SOLN
INTRAMUSCULAR | Status: AC
  Filled 2019-02-24: qty 1

## 2019-02-24 MED ORDER — SODIUM CHLORIDE 0.9 % IV SOLN
81.0000 mg/m2 | Freq: Once | INTRAVENOUS | Status: AC
  Administered 2019-02-24: 150 mg via INTRAVENOUS
  Filled 2019-02-24: qty 6

## 2019-02-24 MED ORDER — SODIUM CHLORIDE 0.9 % IV SOLN
Freq: Once | INTRAVENOUS | Status: AC
  Administered 2019-02-24: 08:00:00 via INTRAVENOUS
  Filled 2019-02-24: qty 250

## 2019-02-24 MED ORDER — DEXAMETHASONE SODIUM PHOSPHATE 10 MG/ML IJ SOLN
10.0000 mg | Freq: Once | INTRAMUSCULAR | Status: AC
  Administered 2019-02-24: 10 mg via INTRAVENOUS

## 2019-02-24 NOTE — Patient Instructions (Signed)
[facility] Discharge Instructions for Patients Receiving Chemotherapy  Today you received the following chemotherapy agents Bendeka.   To help prevent nausea and vomiting after your treatment, we encourage you to take your nausea medication as prescribed.    If you develop nausea and vomiting that is not controlled by your nausea medication, call the clinic.   BELOW ARE SYMPTOMS THAT SHOULD BE REPORTED IMMEDIATELY:  *FEVER GREATER THAN 100.5 F  *CHILLS WITH OR WITHOUT FEVER  NAUSEA AND VOMITING THAT IS NOT CONTROLLED WITH YOUR NAUSEA MEDICATION  *UNUSUAL SHORTNESS OF BREATH  *UNUSUAL BRUISING OR BLEEDING  TENDERNESS IN MOUTH AND THROAT WITH OR WITHOUT PRESENCE OF ULCERS  *URINARY PROBLEMS  *BOWEL PROBLEMS  UNUSUAL RASH Items with * indicate a potential emergency and should be followed up as soon as possible.  Feel free to call the clinic should you have any questions or concerns. The clinic phone number is [phone].  Please show the CHEMO ALERT CARD at check-in to the Emergency Department and triage nurse.  

## 2019-02-25 LAB — PROTEIN ELECTROPHORESIS, SERUM, WITH REFLEX
A/G Ratio: 1.4 (ref 0.7–1.7)
Albumin ELP: 3.7 g/dL (ref 2.9–4.4)
Alpha-1-Globulin: 0.2 g/dL (ref 0.0–0.4)
Alpha-2-Globulin: 0.5 g/dL (ref 0.4–1.0)
Beta Globulin: 1 g/dL (ref 0.7–1.3)
Gamma Globulin: 0.8 g/dL (ref 0.4–1.8)
Globulin, Total: 2.6 g/dL (ref 2.2–3.9)
M-Spike, %: 0.4 g/dL — ABNORMAL HIGH
SPEP Interpretation: 0
Total Protein ELP: 6.3 g/dL (ref 6.0–8.5)

## 2019-02-25 LAB — IMMUNOFIXATION REFLEX, SERUM
IgA: 54 mg/dL — ABNORMAL LOW (ref 64–422)
IgG (Immunoglobin G), Serum: 385 mg/dL — ABNORMAL LOW (ref 700–1600)
IgM (Immunoglobulin M), Srm: 1515 mg/dL — ABNORMAL HIGH (ref 26–217)

## 2019-03-30 ENCOUNTER — Telehealth: Payer: Self-pay | Admitting: *Deleted

## 2019-03-30 NOTE — Telephone Encounter (Signed)
Call received from patient stating that she is to have a CT scan prior to her appt with Dr. Marin Olp next week and has not had a call from CT yet.  Call placed to central scheduling and they will call pt regarding CT appt.

## 2019-03-31 ENCOUNTER — Telehealth: Payer: Self-pay | Admitting: *Deleted

## 2019-03-31 NOTE — Telephone Encounter (Signed)
Lisinopril/hctz is on backorder.  Two separate meds sent in.

## 2019-04-01 NOTE — Telephone Encounter (Signed)
Kristina Ramos with [facility] calling for verbal approval on separate meds - notes indicate being sent in 03/31/2019 but no orders in med list - NT not able to provide verbal - Kristina Ramos stated she would notify pt as the combo med is on backorder and without approval she cannot dispense separate meds even though pt is out.

## 2019-04-04 ENCOUNTER — Ambulatory Visit: Payer: Medicare Other

## 2019-04-04 ENCOUNTER — Ambulatory Visit (HOSPITAL_BASED_OUTPATIENT_CLINIC_OR_DEPARTMENT_OTHER)
Admission: RE | Admit: 2019-04-04 | Discharge: 2019-04-04 | Disposition: A | Discharge status: Home / Self Care | Payer: Medicare Other | Source: Ambulatory Visit | Attending: Hematology & Oncology | Admitting: Hematology & Oncology

## 2019-04-04 ENCOUNTER — Other Ambulatory Visit: Payer: Self-pay | Admitting: *Deleted

## 2019-04-04 ENCOUNTER — Encounter (HOSPITAL_BASED_OUTPATIENT_CLINIC_OR_DEPARTMENT_OTHER): Payer: Self-pay

## 2019-04-04 ENCOUNTER — Other Ambulatory Visit: Payer: Self-pay

## 2019-04-04 DIAGNOSIS — C8300 Small cell B-cell lymphoma, unspecified site: Secondary | ICD-10-CM

## 2019-04-04 DIAGNOSIS — C3490 Malignant neoplasm of unspecified part of unspecified bronchus or lung: Secondary | ICD-10-CM | POA: Diagnosis not present

## 2019-04-04 DIAGNOSIS — C7931 Secondary malignant neoplasm of brain: Secondary | ICD-10-CM | POA: Diagnosis not present

## 2019-04-04 MED ORDER — IOHEXOL 300 MG/ML  SOLN
100.0000 mL | Freq: Once | INTRAMUSCULAR | Status: AC | PRN
  Administered 2019-04-04: 10:00:00 100 mL via INTRAVENOUS

## 2019-04-04 MED ORDER — LISINOPRIL 10 MG PO TABS: 10.0000 mg | ORAL_TABLET | Freq: Every day | ORAL | 1 refills | Status: DC

## 2019-04-04 MED ORDER — HYDROCHLOROTHIAZIDE 12.5 MG PO TABS: 12.5000 mg | ORAL_TABLET | Freq: Every day | ORAL | 1 refills | Status: DC

## 2019-04-04 NOTE — Telephone Encounter (Signed)
meds sent in

## 2019-04-05 ENCOUNTER — Telehealth: Payer: Self-pay | Admitting: *Deleted

## 2019-04-05 NOTE — Telephone Encounter (Signed)
Notified pt of CT results. Pt thanked me for the call. No further concerns.

## 2019-04-05 NOTE — Telephone Encounter (Signed)
-----   Message from Volanda Napoleon, MD sent at 04/04/2019  3:44 PM EDT ----- Call - the spleen is smaller!!  No enlarged lymph nodes to suggest active lymphoma!!  [name]

## 2019-04-06 ENCOUNTER — Other Ambulatory Visit: Payer: Self-pay

## 2019-04-06 ENCOUNTER — Inpatient Hospital Stay: Payer: Medicare Other

## 2019-04-06 ENCOUNTER — Inpatient Hospital Stay: Payer: Medicare Other | Attending: Hematology & Oncology | Admitting: Hematology & Oncology

## 2019-04-06 ENCOUNTER — Encounter: Payer: Self-pay | Admitting: Hematology & Oncology

## 2019-04-06 VITALS — BP 151/63 | HR 75

## 2019-04-06 VITALS — BP 124/56 | HR 64 | Temp 98.2°F | Resp 20 | Wt 184.1 lb

## 2019-04-06 DIAGNOSIS — E538 Deficiency of other specified B group vitamins: Secondary | ICD-10-CM

## 2019-04-06 DIAGNOSIS — Z79899 Other long term (current) drug therapy: Secondary | ICD-10-CM | POA: Insufficient documentation

## 2019-04-06 DIAGNOSIS — C8300 Small cell B-cell lymphoma, unspecified site: Secondary | ICD-10-CM

## 2019-04-06 DIAGNOSIS — D508 Other iron deficiency anemias: Secondary | ICD-10-CM

## 2019-04-06 DIAGNOSIS — C8280 Other types of follicular lymphoma, unspecified site: Secondary | ICD-10-CM | POA: Diagnosis not present

## 2019-04-06 DIAGNOSIS — Z5112 Encounter for antineoplastic immunotherapy: Secondary | ICD-10-CM | POA: Insufficient documentation

## 2019-04-06 DIAGNOSIS — D500 Iron deficiency anemia secondary to blood loss (chronic): Secondary | ICD-10-CM

## 2019-04-06 LAB — CMP (CANCER CENTER ONLY)
ALT: 25 U/L (ref 0–44)
AST: 21 U/L (ref 15–41)
Albumin: 4.2 g/dL (ref 3.5–5.0)
Alkaline Phosphatase: 84 U/L (ref 38–126)
Anion gap: 5 (ref 5–15)
BUN: 11 mg/dL (ref 8–23)
CO2: 28 mmol/L (ref 22–32)
Calcium: 9.5 mg/dL (ref 8.9–10.3)
Chloride: 96 mmol/L — ABNORMAL LOW (ref 98–111)
Creatinine: 0.73 mg/dL (ref 0.44–1.00)
GFR, Est AFR Am: >60 mL/min (ref >60)
GFR, Estimated: >60 mL/min (ref >60)
Glucose, Bld: 100 mg/dL — ABNORMAL HIGH (ref 70–99)
Potassium: 4.1 mmol/L (ref 3.5–5.1)
Sodium: 129 mmol/L — ABNORMAL LOW (ref 135–145)
Total Bilirubin: 0.5 mg/dL (ref 0.3–1.2)
Total Protein: 6.4 g/dL — ABNORMAL LOW (ref 6.5–8.1)

## 2019-04-06 LAB — CBC WITH DIFFERENTIAL (CANCER CENTER ONLY)
Abs Immature Granulocytes: 0.01 10*3/uL (ref 0.00–0.07)
Basophils Absolute: 0.1 10*3/uL (ref 0.0–0.1)
Basophils Relative: 1 %
Eosinophils Absolute: 0.1 10*3/uL (ref 0.0–0.5)
Eosinophils Relative: 3 %
HCT: 30 % — ABNORMAL LOW (ref 36.0–46.0)
Hemoglobin: 10.8 g/dL — ABNORMAL LOW (ref 12.0–15.0)
Immature Granulocytes: 0 %
Lymphocytes Relative: 17 %
Lymphs Abs: 0.7 10*3/uL (ref 0.7–4.0)
MCH: 33.5 pg (ref 26.0–34.0)
MCHC: 36 g/dL (ref 30.0–36.0)
MCV: 93.2 fL (ref 80.0–100.0)
Monocytes Absolute: 0.5 10*3/uL (ref 0.1–1.0)
Monocytes Relative: 11 %
Neutro Abs: 3 10*3/uL (ref 1.7–7.7)
Neutrophils Relative %: 68 %
Platelet Count: 115 10*3/uL — ABNORMAL LOW (ref 150–400)
RBC: 3.22 MIL/uL — ABNORMAL LOW (ref 3.87–5.11)
RDW: 13.2 % (ref 11.5–15.5)
WBC Count: 4.4 10*3/uL (ref 4.0–10.5)
nRBC: 0 % (ref 0.0–0.2)

## 2019-04-06 LAB — LACTATE DEHYDROGENASE: LDH: 146 U/L (ref 98–192)

## 2019-04-06 MED ORDER — DEXAMETHASONE SODIUM PHOSPHATE 10 MG/ML IJ SOLN
INTRAMUSCULAR | Status: AC
  Filled 2019-04-06: qty 1

## 2019-04-06 MED ORDER — SODIUM CHLORIDE 0.9% FLUSH
3.0000 mL | Freq: Once | INTRAVENOUS | Status: DC | PRN
  Filled 2019-04-06: qty 10

## 2019-04-06 MED ORDER — SODIUM CHLORIDE 0.9 % IV SOLN
1000.0000 mg | Freq: Once | INTRAVENOUS | Status: AC
  Administered 2019-04-06: 1000 mg via INTRAVENOUS
  Filled 2019-04-06: qty 40

## 2019-04-06 MED ORDER — ACETAMINOPHEN 325 MG PO TABS
650.0000 mg | ORAL_TABLET | Freq: Once | ORAL | Status: AC
  Administered 2019-04-06: 650 mg via ORAL

## 2019-04-06 MED ORDER — CYANOCOBALAMIN 1000 MCG/ML IJ SOLN
1000.0000 ug | Freq: Once | INTRAMUSCULAR | Status: AC
  Administered 2019-04-06: 1000 ug via INTRAMUSCULAR

## 2019-04-06 MED ORDER — SODIUM CHLORIDE 0.9% FLUSH
10.0000 mL | INTRAVENOUS | Status: DC | PRN
  Filled 2019-04-06: qty 10

## 2019-04-06 MED ORDER — SODIUM CHLORIDE 0.9 % IV SOLN
Freq: Once | INTRAVENOUS | Status: AC
  Administered 2019-04-06: 11:00:00 via INTRAVENOUS
  Filled 2019-04-06: qty 250

## 2019-04-06 MED ORDER — DEXAMETHASONE SODIUM PHOSPHATE 10 MG/ML IJ SOLN
10.0000 mg | Freq: Once | INTRAMUSCULAR | Status: AC
  Administered 2019-04-06: 10 mg via INTRAVENOUS

## 2019-04-06 MED ORDER — CYANOCOBALAMIN 1000 MCG/ML IJ SOLN
INTRAMUSCULAR | Status: AC
  Filled 2019-04-06: qty 1

## 2019-04-06 MED ORDER — DIPHENHYDRAMINE HCL 50 MG/ML IJ SOLN
INTRAMUSCULAR | Status: AC
  Filled 2019-04-06: qty 1

## 2019-04-06 MED ORDER — DIPHENHYDRAMINE HCL 50 MG/ML IJ SOLN
50.0000 mg | Freq: Once | INTRAMUSCULAR | Status: AC
  Administered 2019-04-06: 50 mg via INTRAVENOUS

## 2019-04-06 MED ORDER — ACETAMINOPHEN 325 MG PO TABS
ORAL_TABLET | ORAL | Status: AC
  Filled 2019-04-06: qty 2

## 2019-04-06 NOTE — Patient Instructions (Signed)
Cyanocobalamin, Vitamin B12 injection What is this medicine? CYANOCOBALAMIN (sye an oh koe BAL a min) is a man made form of vitamin B12. Vitamin B12 is used in the growth of healthy blood cells, nerve cells, and proteins in the body. It also helps with the metabolism of fats and carbohydrates. This medicine is used to treat people who can not absorb vitamin B12. This medicine may be used for other purposes; ask your health care provider or pharmacist if you have questions. COMMON BRAND NAME(S): B-12 Compliance Kit, B-12 Injection Kit, Cyomin, LA-12, Nutri-Twelve, Physicians EZ Use B-12, Primabalt What should I tell my health care provider before I take this medicine? They need to know if you have any of these conditions: -kidney disease -Leber's disease -megaloblastic anemia -an unusual or allergic reaction to cyanocobalamin, cobalt, other medicines, foods, dyes, or preservatives -pregnant or trying to get pregnant -breast-feeding How should I use this medicine? This medicine is injected into a muscle or deeply under the skin. It is usually given by a health care professional in a clinic or doctor's office. However, your doctor may teach you how to inject yourself. Follow all instructions. Talk to your pediatrician regarding the use of this medicine in children. Special care may be needed. Overdosage: If you think you have taken too much of this medicine contact a poison control center or emergency room at once. NOTE: This medicine is only for you. Do not share this medicine with others. What if I miss a dose? If you are given your dose at a clinic or doctor's office, call to reschedule your appointment. If you give your own injections and you miss a dose, take it as soon as you can. If it is almost time for your next dose, take only that dose. Do not take double or extra doses. What may interact with this medicine? -colchicine -heavy alcohol intake This list may not describe all possible  interactions. Give your health care provider a list of all the medicines, herbs, non-prescription drugs, or dietary supplements you use. Also tell them if you smoke, drink alcohol, or use illegal drugs. Some items may interact with your medicine. What should I watch for while using this medicine? Visit your doctor or health care professional regularly. You may need blood work done while you are taking this medicine. You may need to follow a special diet. Talk to your doctor. Limit your alcohol intake and avoid smoking to get the best benefit. What side effects may I notice from receiving this medicine? Side effects that you should report to your doctor or health care professional as soon as possible: -allergic reactions like skin rash, itching or hives, swelling of the face, lips, or tongue -blue tint to skin -chest tightness, pain -difficulty breathing, wheezing -dizziness -red, swollen painful area on the leg Side effects that usually do not require medical attention (report to your doctor or health care professional if they continue or are bothersome): -diarrhea -headache This list may not describe all possible side effects. Call your doctor for medical advice about side effects. You may report side effects to FDA at 1-800-FDA-1088. Where should I keep my medicine? Keep out of the reach of children. Store at room temperature between 15 and 30 degrees C (59 and 85 degrees F). Protect from light. Throw away any unused medicine after the expiration date. NOTE: This sheet is a summary. It may not cover all possible information. If you have questions about this medicine, talk to your doctor, pharmacist, or   health care provider.  2019 Elsevier/Gold Standard (2008-03-20 22:10:20) Dexamethasone injection What is this medicine? DEXAMETHASONE (dex a METH a sone) is a corticosteroid. It is used to treat inflammation of the skin, joints, lungs, and other organs. Common conditions treated include  asthma, allergies, and arthritis. It is also used for other conditions, like blood disorders and diseases of the adrenal glands. This medicine may be used for other purposes; ask your health care provider or pharmacist if you have questions. COMMON BRAND NAME(S): Decadron, DoubleDex, Simplist Dexamethasone, Solurex What should I tell my health care provider before I take this medicine? They need to know if you have any of these conditions: -blood clotting problems -Cushing's syndrome -diabetes -glaucoma -heart problems or disease -high blood pressure -infection like herpes, measles, tuberculosis, or chickenpox -kidney disease -liver disease -mental problems -myasthenia gravis -osteoporosis -previous heart attack -seizures -stomach, ulcer or intestine disease including colitis and diverticulitis -thyroid problem -an unusual or allergic reaction to dexamethasone, corticosteroids, other medicines, lactose, foods, dyes, or preservatives -pregnant or trying to get pregnant -breast-feeding How should I use this medicine? This medicine is for injection into a muscle, joint, lesion, soft tissue, or vein. It is given by a health care professional in a hospital or clinic setting. Talk to your pediatrician regarding the use of this medicine in children. Special care may be needed. Overdosage: If you think you have taken too much of this medicine contact a poison control center or emergency room at once. NOTE: This medicine is only for you. Do not share this medicine with others. What if I miss a dose? This may not apply. If you are having a series of injections over a prolonged period, try not to miss an appointment. Call your doctor or health care professional to reschedule if you are unable to keep an appointment. What may interact with this medicine? Do not take this medicine with any of the following medications: -mifepristone, RU-486 -vaccines This medicine may also interact with the  following medications: -amphotericin B -antibiotics like clarithromycin, erythromycin, and troleandomycin -aspirin and aspirin-like drugs -barbiturates like phenobarbital -carbamazepine -cholestyramine -cholinesterase inhibitors like donepezil, galantamine, rivastigmine, and tacrine -cyclosporine -digoxin -diuretics -ephedrine -female hormones, like estrogens or progestins and birth control pills -indinavir -isoniazid -ketoconazole -medicines for diabetes -medicines that improve muscle tone or strength for conditions like myasthenia gravis -NSAIDs, medicines for pain and inflammation, like ibuprofen or naproxen -phenytoin -rifampin -thalidomide -warfarin This list may not describe all possible interactions. Give your health care provider a list of all the medicines, herbs, non-prescription drugs, or dietary supplements you use. Also tell them if you smoke, drink alcohol, or use illegal drugs. Some items may interact with your medicine. What should I watch for while using this medicine? Your condition will be monitored carefully while you are receiving this medicine. If you are taking this medicine for a long time, carry an identification card with your name and address, the type and dose of your medicine, and your doctor's name and address. This medicine may increase your risk of getting an infection. Stay away from people who are sick. Tell your doctor or health care professional if you are around anyone with measles or chickenpox. Talk to your health care provider before you get any vaccines that you take this medicine. If you are going to have surgery, tell your doctor or health care professional that you have taken this medicine within the last twelve months. Ask your doctor or health care professional about your diet. You may need  to lower the amount of salt you eat. The medicine can increase your blood sugar. If you are a diabetic check with your doctor if you need help adjusting  the dose of your diabetic medicine. What side effects may I notice from receiving this medicine? Side effects that you should report to your doctor or health care professional as soon as possible: -allergic reactions like skin rash, itching or hives, swelling of the face, lips, or tongue -black or tarry stools -change in the amount of urine -changes in vision -confusion, excitement, restlessness, a false sense of well-being -fever, sore throat, sneezing, cough, or other signs of infection, wounds that will not heal -hallucinations -increased thirst -mental depression, mood swings, mistaken feelings of self importance or of being mistreated -pain in hips, back, ribs, arms, shoulders, or legs -pain, redness, or irritation at the injection site -redness, blistering, peeling or loosening of the skin, including inside the mouth -rounding out of face -swelling of feet or lower legs -unusual bleeding or bruising -unusual tired or weak -wounds that do not heal Side effects that usually do not require medical attention (report to your doctor or health care professional if they continue or are bothersome): -diarrhea or constipation -change in taste -headache -nausea, vomiting -skin problems, acne, thin and shiny skin -touble sleeping -unusual growth of hair on the face or body -weight gain This list may not describe all possible side effects. Call your doctor for medical advice about side effects. You may report side effects to FDA at 1-800-FDA-1088. Where should I keep my medicine? This drug is given in a hospital or clinic and will not be stored at home. NOTE: This sheet is a summary. It may not cover all possible information. If you have questions about this medicine, talk to your doctor, pharmacist, or health care provider.  2019 Elsevier/Gold Standard (2008-03-30 14:04:12) Diphenhydramine injection What is this medicine? DIPHENHYDRAMINE (dye fen HYE dra meen) is an antihistamine. It  is used to treat the symptoms of an allergic reaction and motion sickness. It is also used to treat Parkinson's disease. This medicine may be used for other purposes; ask your health care provider or pharmacist if you have questions. COMMON BRAND NAME(S): Benadryl What should I tell my health care provider before I take this medicine? They need to know if you have any of these conditions: -asthma or lung disease -glaucoma -high blood pressure or heart disease -liver disease -pain or difficulty passing urine -prostate trouble -ulcers or other stomach problems -an unusual or allergic reaction to diphenhydramine, antihistamines, other medicines foods, dyes, or preservatives -pregnant or trying to get pregnant -breast-feeding How should I use this medicine? This medicine is for injection into a vein or a muscle. It is usually given by a health care professional in a hospital or clinic setting. If you get this medicine at home, you will be taught how to prepare and give this medicine. Use exactly as directed. Take your medicine at regular intervals. Do not take your medicine more often than directed. It is important that you put your used needles and syringes in a special sharps container. Do not put them in a trash can. If you do not have a sharps container, call your pharmacist or healthcare provider to get one. Talk to your pediatrician regarding the use of this medicine in children. While this drug may be prescribed for selected conditions, precautions do apply. This medicine is not approved for use in newborns and premature babies. Patients over 40 years old  may have a stronger reaction and need a smaller dose. Overdosage: If you think you have taken too much of this medicine contact a poison control center or emergency room at once. NOTE: This medicine is only for you. Do not share this medicine with others. What if I miss a dose? If you miss a dose, take it as soon as you can. If it is  almost time for your next dose, take only that dose. Do not take double or extra doses. What may interact with this medicine? Do not take this medicine with any of the following medications: -MAOIs like Carbex, Eldepryl, Marplan, Nardil, and Parnate This medicine may also interact with the following medications: -alcohol -barbiturates, like phenobarbital -medicines for bladder spasm like oxybutynin, tolterodine -medicines for blood pressure -medicines for depression, anxiety, or psychotic disturbances -medicines for movement abnormalities or Parkinson's disease -medicines for sleep -other medicines for cold, cough or allergy -some medicines for the stomach like chlordiazepoxide, dicyclomine This list may not describe all possible interactions. Give your health care provider a list of all the medicines, herbs, non-prescription drugs, or dietary supplements you use. Also tell them if you smoke, drink alcohol, or use illegal drugs. Some items may interact with your medicine. What should I watch for while using this medicine? Your condition will be monitored carefully while you are receiving this medicine. Tell your doctor or healthcare professional if your symptoms do not start to get better or if they get worse. You may get drowsy or dizzy. Do not drive, use machinery, or do anything that needs mental alertness until you know how this medicine affects you. Do not stand or sit up quickly, especially if you are an older patient. This reduces the risk of dizzy or fainting spells. Alcohol may interfere with the effect of this medicine. Avoid alcoholic drinks. Your mouth may get dry. Chewing sugarless gum or sucking hard candy, and drinking plenty of water may help. Contact your doctor if the problem does not go away or is severe. What side effects may I notice from receiving this medicine? Side effects that you should report to your doctor or health care professional as soon as possible: -allergic  reactions like skin rash, itching or hives, swelling of the face, lips, or tongue -breathing problems -changes in vision -chills -confused, agitated, nervous -irregular or fast heartbeat -low blood pressure -seizures -tremor -trouble passing urine -unusual bleeding or bruising -unusually weak or tired Side effects that usually do not require medical attention (report to your doctor or health care professional if they continue or are bothersome): -constipation, diarrhea -drowsy -headache -loss of appetite -stomach upset, vomiting -sweating -thick mucous This list may not describe all possible side effects. Call your doctor for medical advice about side effects. You may report side effects to FDA at 1-800-FDA-1088. Where should I keep my medicine? Keep out of the reach of children. If you are using this medicine at home, you will be instructed on how to store this medicine. Throw away any unused medicine after the expiration date on the label. NOTE: This sheet is a summary. It may not cover all possible information. If you have questions about this medicine, talk to your doctor, pharmacist, or health care provider.  2019 Elsevier/Gold Standard (2008-03-28 14:28:35) Acetaminophen oral solution What is this medicine? ACETAMINOPHEN (a set a MEE noe fen) is a pain reliever. It is used to treat mild pain and fever. This medicine may be used for other purposes; ask your health care provider or  pharmacist if you have questions. COMMON BRAND NAME(S): Apra, Comtrex Sore Throat Relief, ED-APAP, ElixSure Fever/Pain, Goody's Back & Body Pain, LIQUID PAIN RELIEF, Mapap, Pain and Fever, Q-Pap, Silapap, Triaminic Fever Reducer and Pain Reliever, Triaminic Infant Fever Reducer and Pain Reliever, Tylenol, Tylenol Extra Strength, Tylenol Sore Throat What should I tell my health care provider before I take this medicine? They need to know if you have any of these conditions: -if you often drink  alcohol -liver disease -phenylketonuria -an unusual or allergic reaction to acetaminophen, other medicines, foods, dyes, or preservatives -pregnant or trying to get pregnant -breast-feeding How should I use this medicine? Take this medicine by mouth. This medicine comes in more than one concentration. Check the concentration on the label before every dose to make sure you are giving the right dose. Follow the directions on the package or prescription label. Use a specially marked spoon or dropper to measure each dose. Ask your pharmacist if you do not have one. Household spoons are not accurate. Do not take your medicine more often than directed. Talk to your pediatrician regarding the use of this medicine in children. While this drug may be prescribed for children as young as 81 years old for selected conditions, precautions do apply. Overdosage: If you think you have taken too much of this medicine contact a poison control center or emergency room at once. NOTE: This medicine is only for you. Do not share this medicine with others. What if I miss a dose? If you miss a dose, take it as soon as you can. If it is almost time for your next dose, take only that dose. Do not take double or extra doses. What may interact with this medicine? -alcohol -imatinib -isoniazid -other medicines that contain acetaminophen This list may not describe all possible interactions. Give your health care provider a list of all the medicines, herbs, non-prescription drugs, or dietary supplements you use. Also tell them if you smoke, drink alcohol, or use illegal drugs. Some items may interact with your medicine. What should I watch for while using this medicine? Tell your doctor or health care professional if the pain lasts more than 10 days (5 days for children), if it gets worse, or if there is a new or different kind of pain. Also, check with your doctor if a fever lasts for more than 3 days. Do not take  acetaminophen (Tylenol) or other medicines that contain acetaminophen with this medicine. Too much acetaminophen can be very dangerous and cause an overdose. Always read labels carefully. Report any possible overdose to your doctor right away, even if there are no symptoms. The effects of extra doses may not be seen for many days. What side effects may I notice from receiving this medicine? Side effects that you should report to your doctor or health care professional as soon as possible: -allergic reactions like skin rash, itching or hives, swelling of the face, lips, or tongue -breathing problems -redness, blistering, peeling or loosening of the skin, including inside the mouth -sore throat with fever, headache, rash, nausea, or vomiting -trouble passing urine or change in the amount of urine -unusual bleeding or bruising -unusually weak or tired -yellowing of the eyes, skin Side effects that usually do not require medical attention (report to your doctor or health care professional if they continue or are bothersome): -headache -nausea, stomach upset This list may not describe all possible side effects. Call your doctor for medical advice about side effects. You may report side  effects to FDA at 1-800-FDA-1088. Where should I keep my medicine? Keep out of reach of children. Store at room temperature between 20 and 25 degrees C (68 and 77 degrees F). Protect from moisture and heat. Throw away any unused medicine after the expiration date. NOTE: This sheet is a summary. It may not cover all possible information. If you have questions about this medicine, talk to your doctor, pharmacist, or health care provider.  2019 Elsevier/Gold Standard (2013-08-01 13:56:24) Obinutuzumab injection What is this medicine? OBINUTUZUMAB (OH bi nue TOOZ ue mab) is a monoclonal antibody. It is used to treat chronic lymphocytic leukemia (CLL) and a type of non-Hodgkin lymphoma (NHL), follicular lymphoma. This  medicine may be used for other purposes; ask your health care provider or pharmacist if you have questions. COMMON BRAND NAME(S): GAZYVA What should I tell my health care provider before I take this medicine? They need to know if you have any of these conditions: -infection (especially a virus infection such as hepatitis B virus) -lung or breathing disease -heart disease -take medicines that treat or prevent blood clots -an unusual or allergic reaction to obinutuzumab, other medicines, foods, dyes, or preservatives -pregnant or trying to get pregnant -breast-feeding How should I use this medicine? This medicine is for infusion into a vein. It is given by a health care professional in a hospital or clinic setting. Talk to your pediatrician regarding the use of this medicine in children. Special care may be needed. Overdosage: If you think you have taken too much of this medicine contact a poison control center or emergency room at once. NOTE: This medicine is only for you. Do not share this medicine with others. What if I miss a dose? Keep appointments for follow-up doses as directed. It is important not to miss your dose. Call your doctor or health care professional if you are unable to keep an appointment. What may interact with this medicine? -live virus vaccines This list may not describe all possible interactions. Give your health care provider a list of all the medicines, herbs, non-prescription drugs, or dietary supplements you use. Also tell them if you smoke, drink alcohol, or use illegal drugs. Some items may interact with your medicine. What should I watch for while using this medicine? Report any side effects that you notice during your treatment right away, such as changes in your breathing, fever, chills, dizziness or lightheadedness. These effects are more common with the first dose. Visit your prescriber or health care professional for checks on your progress. You will need to  have regular blood work. Report any other side effects. The side effects of this medicine can continue after you finish your treatment. Continue your course of treatment even though you feel ill unless your doctor tells you to stop. Call your doctor or health care professional for advice if you get a fever, chills or sore throat, or other symptoms of a cold or flu. Do not treat yourself. This drug decreases your body's ability to fight infections. Try to avoid being around people who are sick. This medicine may increase your risk to bruise or bleed. Call your doctor or health care professional if you notice any unusual bleeding. What side effects may I notice from receiving this medicine? Side effects that you should report to your doctor or health care professional as soon as possible: -allergic reactions like skin rash, itching or hives, swelling of the face, lips, or tongue -breathing problems -changes in vision -chest pain or chest tightness -  confusion -dizziness -loss of balance or coordination -low blood counts - this medicine may decrease the number of white blood cells, red blood cells and platelets. You may be at increased risk for infections and bleeding. -signs of decreased platelets or bleeding - bruising, pinpoint red spots on the skin, black, tarry stools, blood in the urine -signs of infection - fever or chills, cough, sore throat, pain or trouble passing urine -signs and symptoms of liver injury like dark yellow or brown urine; general ill feeling or flu-like symptoms; light-colored stools; loss of appetite; nausea; right upper belly pain; unusually weak or tired; yellowing of the eyes or skin -trouble speaking or understanding -trouble walking -vomiting Side effects that usually do not require medical attention (report to your doctor or health care professional if they continue or are bothersome): -constipation -joint pain -muscle pain This list may not describe all possible  side effects. Call your doctor for medical advice about side effects. You may report side effects to FDA at 1-800-FDA-1088. Where should I keep my medicine? This drug is only given in a hospital or clinic and will not be stored at home. NOTE: This sheet is a summary. It may not cover all possible information. If you have questions about this medicine, talk to your doctor, pharmacist, or health care provider.  2019 Elsevier/Gold Standard (2016-01-10 08:54:03)

## 2019-04-06 NOTE — Progress Notes (Signed)
Hematology and Oncology Follow Up Visit  Kristina Ramos [id number] [birth year] 78 y.o. 04/06/2019   Principle Diagnosis:  Follicular lymphoplasmacytic B-cell Non-Hodgkin's Lymphoma  Current Therapy:   Gazyva/Bendamustine -  S/p cycle #6 -- completed on 02/23/2019 Gazyva -- Maintenance every 2 month--s/p cycle #1 --start on 04/06/2019    Interim History: Kristina Ramos is here today for follow-up and treatment.  She is done incredibly well.  We went ahead and repeat her scans.  The CT scans show that she had a wonderful response.  Her splenomegaly is no further.  Her spleen is on 13.5 cm in size.  There is no adenopathy.  Her monoclonal studies show a 0.4 g/dL M spike.  Her IgM level is 1381 mg/dL.  Her kappa light chain is 4.6 mg/dL.  I think she is clearly ready for maintenance therapy.  I think maintenance therapy will be critical for her.  She has had no problems with nausea or vomiting.  There is been no cough.  She has had no change in bowel or bladder habits.  She had a very quiet Easter weekend.  She has had no pain.  There is been no bleeding.  She has had no fever.    Overall, her performance status is ECOG 1.  Medications:  Allergies as of 04/06/2019      Reactions   Aspirin    REACTION: pt states that it irritates her stomach, coated ASA is ok      Medication List       Accurate as of April 06, 2019  9:49 AM. Always use your most recent med list.        amoxicillin 500 MG capsule Commonly known as:  AMOXIL Take 2,000 mg by mouth as needed. only for dental procedeure   dexamethasone 4 MG tablet Commonly known as:  DECADRON Take 2 tablets (8 mg total) by mouth daily. Start the day after chemotherapy for 2 days. Take with food.   famciclovir 250 MG tablet Commonly known as:  FAMVIR Take 1 tablet (250 mg total) by mouth daily.   hydrochlorothiazide 12.5 MG tablet Commonly known as:  HYDRODIURIL Take 1 tablet (12.5 mg total) by mouth daily.   levothyroxine 75 MCG  tablet Commonly known as:  SYNTHROID, LEVOTHROID TAKE 1 TABLET BY MOUTH EVERY DAY   lisinopril 10 MG tablet Commonly known as:  PRINIVIL,ZESTRIL Take 1 tablet (10 mg total) by mouth daily.   lisinopril-hydrochlorothiazide 10-12.5 MG tablet Commonly known as:  PRINZIDE,ZESTORETIC Take 1 tablet by mouth daily.   LORazepam 0.5 MG tablet Commonly known as:  Ativan Take 1 tablet (0.5 mg total) by mouth every 6 (six) hours as needed (Nausea or vomiting).   montelukast 10 MG tablet Commonly known as:  SINGULAIR TAKE 1 TABLET BY MOUTH EVERYDAY AT BEDTIME   ondansetron 8 MG tablet Commonly known as:  Zofran Take 1 tablet (8 mg total) by mouth 2 (two) times daily. Start second day after chemotherapy. Then take as needed for nausea or vomiting.   prochlorperazine 10 MG tablet Commonly known as:  COMPAZINE Take 1 tablet (10 mg total) by mouth every 6 (six) hours as needed (Nausea or vomiting).   Vitamin D 50 MCG (2000 UT) Caps Take 1 capsule by mouth daily.       Allergies:  Allergies  Allergen Reactions  . Aspirin     REACTION: pt states that it irritates her stomach, coated ASA is ok    Past Medical History, Surgical history, Social history,  and Family History were reviewed and updated.  Review of Systems: Review of Systems  Constitutional: Negative.   HENT: Negative.   Eyes: Negative.   Respiratory: Negative.   Cardiovascular: Negative.   Gastrointestinal: Negative.   Genitourinary: Negative.   Musculoskeletal: Negative.   Skin: Negative.   Neurological: Negative.   Endo/Heme/Allergies: Negative.   Psychiatric/Behavioral: Negative.      Physical Exam:  weight is 184 lb 1.9 oz (83.5 kg). Her oral temperature is 98.2 F (36.8 C). Her blood pressure is 124/56 (abnormal) and her pulse is 64. Her respiration is 20 and oxygen saturation is 100%.   Wt Readings from Last 3 Encounters:  04/06/19 184 lb 1.9 oz (83.5 kg)  02/23/19 178 lb 12.8 oz (81.1 kg)  01/26/19 173  lb (78.5 kg)    Physical Exam Vitals signs reviewed.  HENT:     Head: Normocephalic and atraumatic.  Eyes:     Pupils: Pupils are equal, round, and reactive to light.  Neck:     Musculoskeletal: Normal range of motion.  Cardiovascular:     Rate and Rhythm: Normal rate and regular rhythm.     Heart sounds: Normal heart sounds.  Pulmonary:     Effort: Pulmonary effort is normal.     Breath sounds: Normal breath sounds.  Abdominal:     General: Bowel sounds are normal.     Palpations: Abdomen is soft.     Comments: Abdominal exam shows a soft abdomen.  Bowel sounds are present.  There is no fluid wave.  There is no inguinal adenopathy.  There is no hepatomegaly.  Her spleen has shrunk down quite nicely.  I really cannot palpate her spleen..  Musculoskeletal: Normal range of motion.        General: No tenderness or deformity.  Lymphadenopathy:     Cervical: No cervical adenopathy.  Skin:    General: Skin is warm and dry.     Findings: No erythema or rash.  Neurological:     Mental Status: She is alert and oriented to person, place, and time.  Psychiatric:        Behavior: Behavior normal.        Thought Content: Thought content normal.        Judgment: Judgment normal.      Lab Results  Component Value Date   WBC 4.4 04/06/2019   HGB 10.8 (L) 04/06/2019   HCT 30.0 (L) 04/06/2019   MCV 93.2 04/06/2019   PLT 115 (L) 04/06/2019   Lab Results  Component Value Date   FERRITIN 849 (H) 11/03/2018   IRON 100 11/03/2018   TIBC 281 11/03/2018   UIBC 182 11/03/2018   IRONPCTSAT 35 11/03/2018   Lab Results  Component Value Date   RETICCTPCT 2.0 11/03/2018   RBC 3.22 (L) 04/06/2019   RETICCTABS 105,600 (H) 03/03/2018   Lab Results  Component Value Date   KPAFRELGTCHN 46.5 (H) 02/23/2019   LAMBDASER 3.7 (L) 02/23/2019   KAPLAMBRATIO 12.57 (H) 02/23/2019   Lab Results  Component Value Date   IGGSERUM 377 (L) 02/23/2019   IGGSERUM 385 (L) 02/23/2019   IGA 54 (L)  02/23/2019   IGA 54 (L) 02/23/2019   IGMSERUM 1,381 (H) 02/23/2019   IGMSERUM 1,515 (H) 02/23/2019   Lab Results  Component Value Date   TOTALPROTELP 6.3 02/23/2019   ALBUMINELP 3.7 02/23/2019   A1GS 0.2 02/23/2019   A2GS 0.5 02/23/2019   BETS 1.0 02/23/2019   GAMS 0.8 02/23/2019   MSPIKE  0.4 (H) 02/23/2019   SPEI Comment 11/03/2018     Chemistry      Component Value Date/Time   NA 129 (L) 04/06/2019 0820   K 4.1 04/06/2019 0820   CL 96 (L) 04/06/2019 0820   CO2 28 04/06/2019 0820   BUN 11 04/06/2019 0820   CREATININE 0.73 04/06/2019 0820      Component Value Date/Time   CALCIUM 9.5 04/06/2019 0820   ALKPHOS 84 04/06/2019 0820   AST 21 04/06/2019 0820   ALT 25 04/06/2019 0820   BILITOT 0.5 04/06/2019 0820       Impression and Plan: Ms. 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  Clinically, she is done well.  She is responding.  Again, I am so happy that she has responded as I thought she would.  She is done a fantastic job.  We now are in the phase of maintenance therapy.  I will go ahead and start her on Gazyva.  We will do Gazyva every 2 months.  I think she will tolerate this.  I will have her come back in 2 months for her next cycle.        Volanda Napoleon, MD 4/15/20209:49 AM

## 2019-04-07 ENCOUNTER — Other Ambulatory Visit: Payer: Self-pay | Admitting: *Deleted

## 2019-04-07 ENCOUNTER — Telehealth: Payer: Self-pay | Admitting: *Deleted

## 2019-04-07 ENCOUNTER — Ambulatory Visit (HOSPITAL_BASED_OUTPATIENT_CLINIC_OR_DEPARTMENT_OTHER): Payer: Medicare Other

## 2019-04-07 LAB — KAPPA/LAMBDA LIGHT CHAINS
Kappa free light chain: 33.6 mg/L — ABNORMAL HIGH (ref 3.3–19.4)
Kappa, lambda light chain ratio: 8.84 — ABNORMAL HIGH (ref 0.26–1.65)
Lambda free light chains: 3.8 mg/L — ABNORMAL LOW (ref 5.7–26.3)

## 2019-04-07 LAB — IGG, IGA, IGM
IgA: 59 mg/dL — ABNORMAL LOW (ref 64–422)
IgG (Immunoglobin G), Serum: 357 mg/dL — ABNORMAL LOW (ref 586–1602)
IgM (Immunoglobulin M), Srm: 1219 mg/dL — ABNORMAL HIGH (ref 26–217)

## 2019-04-07 MED ORDER — MONTELUKAST SODIUM 10 MG PO TABS: ORAL_TABLET | ORAL | 3 refills | Status: DC

## 2019-04-07 MED FILL — MONTELUKAST SOD 10 MG TAB: 10 | 30 days supply | Qty: 30 | Fill #0

## 2019-04-07 NOTE — Telephone Encounter (Signed)
Patient notified that request received from [facility] for new rx d/t montelukast is on national backorder. Pt informed that [facility] does have montelukast available.  Pt requests that prescription for montelukast be sent to [facility] and is appreciative of call.

## 2019-04-11 LAB — PROTEIN ELECTROPHORESIS, SERUM, WITH REFLEX
A/G Ratio: 1.7 (ref 0.7–1.7)
Albumin ELP: 3.9 g/dL (ref 2.9–4.4)
Alpha-1-Globulin: 0.2 g/dL (ref 0.0–0.4)
Alpha-2-Globulin: 0.5 g/dL (ref 0.4–1.0)
Beta Globulin: 0.9 g/dL (ref 0.7–1.3)
Gamma Globulin: 0.7 g/dL (ref 0.4–1.8)
Globulin, Total: 2.3 g/dL (ref 2.2–3.9)
M-Spike, %: 0.3 g/dL — ABNORMAL HIGH
SPEP Interpretation: 0
Total Protein ELP: 6.2 g/dL (ref 6.0–8.5)

## 2019-04-11 LAB — IMMUNOFIXATION REFLEX, SERUM
IgA: 61 mg/dL — ABNORMAL LOW (ref 64–422)
IgG (Immunoglobin G), Serum: 395 mg/dL — ABNORMAL LOW (ref 586–1602)
IgM (Immunoglobulin M), Srm: 1321 mg/dL — ABNORMAL HIGH (ref 26–217)

## 2019-05-05 ENCOUNTER — Telehealth: Payer: Self-pay | Admitting: *Deleted

## 2019-05-05 NOTE — Telephone Encounter (Signed)
Message received from patient wanting to know if she should continue Montelukast and Famciclovir.  Call placed back to patient and patient notified per order of Dr. Marin Olp to continue Famciclovir and to stop Montelukast.  Pt appreciative of call and has no further questions or concerns at this time.

## 2019-05-06 ENCOUNTER — Encounter: Payer: Self-pay | Admitting: Internal Medicine

## 2019-06-06 ENCOUNTER — Inpatient Hospital Stay: Payer: Medicare Other

## 2019-06-06 ENCOUNTER — Inpatient Hospital Stay: Payer: Medicare Other | Attending: Hematology & Oncology | Admitting: Family

## 2019-06-06 ENCOUNTER — Other Ambulatory Visit: Payer: Self-pay

## 2019-06-06 ENCOUNTER — Telehealth: Payer: Self-pay | Admitting: Family

## 2019-06-06 VITALS — BP 145/68 | HR 69 | Temp 98.5°F | Resp 18 | Ht 64.0 in | Wt 195.8 lb

## 2019-06-06 DIAGNOSIS — C8300 Small cell B-cell lymphoma, unspecified site: Secondary | ICD-10-CM

## 2019-06-06 DIAGNOSIS — Z5112 Encounter for antineoplastic immunotherapy: Secondary | ICD-10-CM | POA: Insufficient documentation

## 2019-06-06 DIAGNOSIS — C8280 Other types of follicular lymphoma, unspecified site: Secondary | ICD-10-CM | POA: Diagnosis present

## 2019-06-06 DIAGNOSIS — C8218 Follicular lymphoma grade II, lymph nodes of multiple sites: Secondary | ICD-10-CM

## 2019-06-06 DIAGNOSIS — R161 Splenomegaly, not elsewhere classified: Secondary | ICD-10-CM | POA: Diagnosis not present

## 2019-06-06 DIAGNOSIS — D508 Other iron deficiency anemias: Secondary | ICD-10-CM

## 2019-06-06 LAB — CMP (CANCER CENTER ONLY)
ALT: 19 U/L (ref 0–44)
AST: 16 U/L (ref 15–41)
Albumin: 4.5 g/dL (ref 3.5–5.0)
Alkaline Phosphatase: 59 U/L (ref 38–126)
Anion gap: 9 (ref 5–15)
BUN: 17 mg/dL (ref 8–23)
CO2: 27 mmol/L (ref 22–32)
Calcium: 9.5 mg/dL (ref 8.9–10.3)
Chloride: 94 mmol/L — ABNORMAL LOW (ref 98–111)
Creatinine: 0.78 mg/dL (ref 0.44–1.00)
GFR, Est AFR Am: >60 mL/min (ref >60)
GFR, Estimated: >60 mL/min (ref >60)
Glucose, Bld: 100 mg/dL — ABNORMAL HIGH (ref 70–99)
Potassium: 3.9 mmol/L (ref 3.5–5.1)
Sodium: 130 mmol/L — ABNORMAL LOW (ref 135–145)
Total Bilirubin: 0.6 mg/dL (ref 0.3–1.2)
Total Protein: 6.6 g/dL (ref 6.5–8.1)

## 2019-06-06 LAB — CBC WITH DIFFERENTIAL (CANCER CENTER ONLY)
Abs Immature Granulocytes: 0 10*3/uL (ref 0.00–0.07)
Band Neutrophils: 1 %
Basophils Absolute: 0 10*3/uL (ref 0.0–0.1)
Basophils Relative: 1 %
Eosinophils Absolute: 0.1 10*3/uL (ref 0.0–0.5)
Eosinophils Relative: 2 %
HCT: 32.3 % — ABNORMAL LOW (ref 36.0–46.0)
Hemoglobin: 11.8 g/dL — ABNORMAL LOW (ref 12.0–15.0)
Lymphocytes Relative: 13 %
Lymphs Abs: 0.5 10*3/uL — ABNORMAL LOW (ref 0.7–4.0)
MCH: 33.2 pg (ref 26.0–34.0)
MCHC: 36.5 g/dL — ABNORMAL HIGH (ref 30.0–36.0)
MCV: 91 fL (ref 80.0–100.0)
Metamyelocytes Relative: 1 %
Monocytes Absolute: 0.4 10*3/uL (ref 0.1–1.0)
Monocytes Relative: 10 %
Neutro Abs: 3.1 10*3/uL (ref 1.7–7.7)
Neutrophils Relative %: 72 %
Platelet Count: 152 10*3/uL (ref 150–400)
RBC: 3.55 MIL/uL — ABNORMAL LOW (ref 3.87–5.11)
RDW: 12.4 % (ref 11.5–15.5)
WBC Count: 4.2 10*3/uL (ref 4.0–10.5)
nRBC: 0 % (ref 0.0–0.2)

## 2019-06-06 LAB — LACTATE DEHYDROGENASE: LDH: 141 U/L (ref 98–192)

## 2019-06-06 MED ORDER — DEXAMETHASONE SODIUM PHOSPHATE 10 MG/ML IJ SOLN
10.0000 mg | Freq: Once | INTRAMUSCULAR | Status: AC
  Administered 2019-06-06: 10 mg via INTRAVENOUS

## 2019-06-06 MED ORDER — SODIUM CHLORIDE 0.9 % IV SOLN
INTRAVENOUS | Status: DC
  Administered 2019-06-06: 10:00:00 via INTRAVENOUS
  Filled 2019-06-06 (×2): qty 250

## 2019-06-06 MED ORDER — SODIUM CHLORIDE 0.9 % IV SOLN
1000.0000 mg | Freq: Once | INTRAVENOUS | Status: AC
  Administered 2019-06-06: 1000 mg via INTRAVENOUS
  Filled 2019-06-06: qty 40

## 2019-06-06 MED ORDER — DIPHENHYDRAMINE HCL 50 MG/ML IJ SOLN
50.0000 mg | Freq: Once | INTRAMUSCULAR | Status: AC
  Administered 2019-06-06: 10:00:00 50 mg via INTRAVENOUS

## 2019-06-06 MED ORDER — DEXAMETHASONE SODIUM PHOSPHATE 10 MG/ML IJ SOLN
INTRAMUSCULAR | Status: AC
  Filled 2019-06-06: qty 1

## 2019-06-06 MED ORDER — ACETAMINOPHEN 325 MG PO TABS
650.0000 mg | ORAL_TABLET | Freq: Once | ORAL | Status: AC
  Administered 2019-06-06: 650 mg via ORAL

## 2019-06-06 MED ORDER — ACETAMINOPHEN 325 MG PO TABS
ORAL_TABLET | ORAL | Status: AC
  Filled 2019-06-06: qty 2

## 2019-06-06 MED ORDER — DIPHENHYDRAMINE HCL 50 MG/ML IJ SOLN
INTRAMUSCULAR | Status: AC
  Filled 2019-06-06: qty 1

## 2019-06-06 NOTE — Progress Notes (Signed)
Hematology and Oncology Follow Up Visit  Kristina Ramos [id number] 10-09-1941 78 y.o. 06/06/2019   Principle Diagnosis:  Follicular lymphoplasmacytic B-cell Non-Hodgkin's Lymphoma  Current Therapy:   Gazyva/Bendamustine -  S/p cycle 6 -- completed on 02/23/2019 Gazyva -- Maintenance every 2 month--s/p cycle 1 --start on 04/06/2019    Interim History:  Kristina Ramos is here today for follow-up and treatment. She is doing quite well and states that her energy continues to improve.  In April her M-spike was down to 0.3, IgM level 1,219 mg/dL and kappa light chains 3.36 mg/dL.  She has had no issue with infection. No fever, chills, n/v, cough, rash, dizziness, SOB, chest pain, palpitations, abdominal pain or changes in bowel or bladder habits.  No lymphadenopathy noted on today's exam.  No episodes of bleeding, no bruising or petechiae.  No swelling, tenderness, numbness or tingling in her extremities.  She has a great appetite and is staying well hydrated. Her weight is up another 11 lbs.   ECOG Performance Status: 1 - Symptomatic but completely ambulatory  Medications:  Allergies as of 06/06/2019      Reactions   Aspirin    REACTION: pt states that it irritates her stomach, coated ASA is ok      Medication List       Accurate as of June 06, 2019  9:05 AM. If you have any questions, ask your nurse or doctor.        amoxicillin 500 MG capsule Commonly known as: AMOXIL Take 2,000 mg by mouth as needed. only for dental procedeure   dexamethasone 4 MG tablet Commonly known as: DECADRON Take 2 tablets (8 mg total) by mouth daily. Start the day after chemotherapy for 2 days. Take with food.   famciclovir 250 MG tablet Commonly known as: FAMVIR Take 1 tablet (250 mg total) by mouth daily.   hydrochlorothiazide 12.5 MG tablet Commonly known as: HYDRODIURIL Take 1 tablet (12.5 mg total) by mouth daily.   levothyroxine 75 MCG tablet Commonly known as: SYNTHROID TAKE 1 TABLET BY  MOUTH EVERY DAY   lisinopril 10 MG tablet Commonly known as: ZESTRIL Take 1 tablet (10 mg total) by mouth daily.   lisinopril-hydrochlorothiazide 10-12.5 MG tablet Commonly known as: ZESTORETIC Take 1 tablet by mouth daily.   LORazepam 0.5 MG tablet Commonly known as: Ativan Take 1 tablet (0.5 mg total) by mouth every 6 (six) hours as needed (Nausea or vomiting).   montelukast 10 MG tablet Commonly known as: SINGULAIR TAKE 1 TABLET BY MOUTH EVERYDAY AT BEDTIME   ondansetron 8 MG tablet Commonly known as: Zofran Take 1 tablet (8 mg total) by mouth 2 (two) times daily. Start second day after chemotherapy. Then take as needed for nausea or vomiting.   prochlorperazine 10 MG tablet Commonly known as: COMPAZINE Take 1 tablet (10 mg total) by mouth every 6 (six) hours as needed (Nausea or vomiting).   Vitamin D 50 MCG (2000 UT) Caps Take 1 capsule by mouth daily.       Allergies:  Allergies  Allergen Reactions  . Aspirin     REACTION: pt states that it irritates her stomach, coated ASA is ok    Past Medical History, Surgical history, Social history, and Family History were reviewed and updated.  Review of Systems: All other 10 point review of systems is negative.   Physical Exam:  vitals were not taken for this visit.   Wt Readings from Last 3 Encounters:  04/06/19 184 lb 1.9 oz (  83.5 kg)  02/23/19 178 lb 12.8 oz (81.1 kg)  01/26/19 173 lb (78.5 kg)    Ocular: Sclerae unicteric, pupils equal, round and reactive to light Ear-nose-throat: Oropharynx clear, dentition fair Lymphatic: No cervical or supraclavicular adenopathy Lungs no rales or rhonchi, good excursion bilaterally Heart regular rate and rhythm, no murmur appreciated Abd soft, nontender, positive bowel sounds, no liver or spleen tip palpated on exam, no fluid wave  MSK no focal spinal tenderness, no joint edema Neuro: non-focal, well-oriented, appropriate affect Breasts: Deferred   Lab Results   Component Value Date   WBC 4.2 06/06/2019   HGB 11.8 (L) 06/06/2019   HCT 32.3 (L) 06/06/2019   MCV 91.0 06/06/2019   PLT 152 06/06/2019   Lab Results  Component Value Date   FERRITIN 849 (H) 11/03/2018   IRON 100 11/03/2018   TIBC 281 11/03/2018   UIBC 182 11/03/2018   IRONPCTSAT 35 11/03/2018   Lab Results  Component Value Date   RETICCTPCT 2.0 11/03/2018   RBC 3.55 (L) 06/06/2019   RETICCTABS 105,600 (H) 03/03/2018   Lab Results  Component Value Date   KPAFRELGTCHN 33.6 (H) 04/06/2019   LAMBDASER 3.8 (L) 04/06/2019   KAPLAMBRATIO 8.84 (H) 04/06/2019   Lab Results  Component Value Date   IGGSERUM 395 (L) 04/06/2019   IGA 61 (L) 04/06/2019   IGMSERUM 1,321 (H) 04/06/2019   Lab Results  Component Value Date   TOTALPROTELP 6.2 04/06/2019   ALBUMINELP 3.9 04/06/2019   A1GS 0.2 04/06/2019   A2GS 0.5 04/06/2019   BETS 0.9 04/06/2019   GAMS 0.7 04/06/2019   MSPIKE 0.3 (H) 04/06/2019   SPEI Comment 11/03/2018     Chemistry      Component Value Date/Time   NA 129 (L) 04/06/2019 0820   K 4.1 04/06/2019 0820   CL 96 (L) 04/06/2019 0820   CO2 28 04/06/2019 0820   BUN 11 04/06/2019 0820   CREATININE 0.73 04/06/2019 0820      Component Value Date/Time   CALCIUM 9.5 04/06/2019 0820   ALKPHOS 84 04/06/2019 0820   AST 21 04/06/2019 0820   ALT 25 04/06/2019 0820   BILITOT 0.5 04/06/2019 0820       Impression and Plan: Kristina Ramos is a very pleasant 78 yo caucasian female with 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  She has had a fantastic response to treatment and has no complaints at this time.  We will proceed with maintenance Gazyva today as planned.  We will plan to see her back in another 2 months.  She will contact our office with any questions or concerns. We can certainly see her sooner if need be.   Laverna Peace, NP 6/15/20209:05 AM

## 2019-06-06 NOTE — Patient Instructions (Signed)
[facility] Discharge Instructions for Patients Receiving Chemotherapy  Today you received the following chemotherapy agents gazyva To help prevent nausea and vomiting after your treatment, we encourage you to take your nausea medication as directed  If you develop nausea and vomiting that is not controlled by your nausea medication, call the clinic.   BELOW ARE SYMPTOMS THAT SHOULD BE REPORTED IMMEDIATELY:  *FEVER GREATER THAN 100.5 F  *CHILLS WITH OR WITHOUT FEVER  NAUSEA AND VOMITING THAT IS NOT CONTROLLED WITH YOUR NAUSEA MEDICATION  *UNUSUAL SHORTNESS OF BREATH  *UNUSUAL BRUISING OR BLEEDING  TENDERNESS IN MOUTH AND THROAT WITH OR WITHOUT PRESENCE OF ULCERS  *URINARY PROBLEMS  *BOWEL PROBLEMS  UNUSUAL RASH Items with * indicate a potential emergency and should be followed up as soon as possible.  Feel free to call the clinic should you have any questions or concerns. The clinic phone number is [phone].  Please show the [facility] at check-in to the Emergency Department and triage nurse.

## 2019-06-06 NOTE — Telephone Encounter (Signed)
Appointments scheduled calendar/avs printed per 6/15 los

## 2019-06-07 LAB — IGG, IGA, IGM
IgA: 60 mg/dL — ABNORMAL LOW (ref 64–422)
IgG (Immunoglobin G), Serum: 387 mg/dL — ABNORMAL LOW (ref 586–1602)
IgM (Immunoglobulin M), Srm: 1091 mg/dL — ABNORMAL HIGH (ref 26–217)

## 2019-06-07 LAB — BETA 2 MICROGLOBULIN, SERUM: Beta-2 Microglobulin: 2.6 mg/L — ABNORMAL HIGH (ref 0.6–2.4)

## 2019-06-07 LAB — KAPPA/LAMBDA LIGHT CHAINS
Kappa free light chain: 36 mg/L — ABNORMAL HIGH (ref 3.3–19.4)
Kappa, lambda light chain ratio: 6.67 — ABNORMAL HIGH (ref 0.26–1.65)
Lambda free light chains: 5.4 mg/L — ABNORMAL LOW (ref 5.7–26.3)

## 2019-06-10 LAB — IMMUNOFIXATION REFLEX, SERUM
IgA: 65 mg/dL (ref 64–422)
IgG (Immunoglobin G), Serum: 403 mg/dL — ABNORMAL LOW (ref 586–1602)
IgM (Immunoglobulin M), Srm: 1121 mg/dL — ABNORMAL HIGH (ref 26–217)

## 2019-06-10 LAB — PROTEIN ELECTROPHORESIS, SERUM, WITH REFLEX
A/G Ratio: 1.6 (ref 0.7–1.7)
Albumin ELP: 4 g/dL (ref 2.9–4.4)
Alpha-1-Globulin: 0.2 g/dL (ref 0.0–0.4)
Alpha-2-Globulin: 0.6 g/dL (ref 0.4–1.0)
Beta Globulin: 1 g/dL (ref 0.7–1.3)
Gamma Globulin: 0.7 g/dL (ref 0.4–1.8)
Globulin, Total: 2.5 g/dL (ref 2.2–3.9)
M-Spike, %: 0.3 g/dL — ABNORMAL HIGH
SPEP Interpretation: 0
Total Protein ELP: 6.5 g/dL (ref 6.0–8.5)

## 2019-06-15 ENCOUNTER — Telehealth: Payer: Self-pay | Admitting: Family

## 2019-06-15 NOTE — Telephone Encounter (Signed)
Called and LMVM for patient to call back to confirm appointments being rescheduled from 8/14 due to provider pal

## 2019-08-05 ENCOUNTER — Encounter: Payer: Self-pay | Admitting: Hematology & Oncology

## 2019-08-05 ENCOUNTER — Inpatient Hospital Stay (HOSPITAL_BASED_OUTPATIENT_CLINIC_OR_DEPARTMENT_OTHER): Payer: Medicare Other | Admitting: Hematology & Oncology

## 2019-08-05 ENCOUNTER — Ambulatory Visit: Payer: Medicare Other | Admitting: Family

## 2019-08-05 ENCOUNTER — Other Ambulatory Visit: Payer: Medicare Other

## 2019-08-05 ENCOUNTER — Other Ambulatory Visit: Payer: Self-pay

## 2019-08-05 ENCOUNTER — Inpatient Hospital Stay: Payer: Medicare Other | Attending: Hematology & Oncology

## 2019-08-05 ENCOUNTER — Inpatient Hospital Stay: Payer: Medicare Other

## 2019-08-05 ENCOUNTER — Ambulatory Visit: Payer: Medicare Other

## 2019-08-05 VITALS — BP 130/59 | HR 80 | Temp 97.3°F | Resp 17

## 2019-08-05 VITALS — BP 142/62 | HR 73 | Temp 97.8°F | Resp 16 | Wt 202.0 lb

## 2019-08-05 DIAGNOSIS — D508 Other iron deficiency anemias: Secondary | ICD-10-CM

## 2019-08-05 DIAGNOSIS — C8300 Small cell B-cell lymphoma, unspecified site: Secondary | ICD-10-CM | POA: Diagnosis not present

## 2019-08-05 DIAGNOSIS — Z79899 Other long term (current) drug therapy: Secondary | ICD-10-CM | POA: Insufficient documentation

## 2019-08-05 DIAGNOSIS — C8280 Other types of follicular lymphoma, unspecified site: Secondary | ICD-10-CM | POA: Insufficient documentation

## 2019-08-05 DIAGNOSIS — R161 Splenomegaly, not elsewhere classified: Secondary | ICD-10-CM | POA: Diagnosis not present

## 2019-08-05 DIAGNOSIS — Z5112 Encounter for antineoplastic immunotherapy: Secondary | ICD-10-CM | POA: Insufficient documentation

## 2019-08-05 DIAGNOSIS — C8218 Follicular lymphoma grade II, lymph nodes of multiple sites: Secondary | ICD-10-CM

## 2019-08-05 LAB — CBC WITH DIFFERENTIAL (CANCER CENTER ONLY)
Abs Immature Granulocytes: 0.29 10*3/uL — ABNORMAL HIGH (ref 0.00–0.07)
Basophils Absolute: 0.1 10*3/uL (ref 0.0–0.1)
Basophils Relative: 2 %
Eosinophils Absolute: 0.1 10*3/uL (ref 0.0–0.5)
Eosinophils Relative: 3 %
HCT: 34.4 % — ABNORMAL LOW (ref 36.0–46.0)
Hemoglobin: 12.4 g/dL (ref 12.0–15.0)
Immature Granulocytes: 8 %
Lymphocytes Relative: 15 %
Lymphs Abs: 0.6 10*3/uL — ABNORMAL LOW (ref 0.7–4.0)
MCH: 32.5 pg (ref 26.0–34.0)
MCHC: 36 g/dL (ref 30.0–36.0)
MCV: 90.3 fL (ref 80.0–100.0)
Monocytes Absolute: 0.4 10*3/uL (ref 0.1–1.0)
Monocytes Relative: 11 %
Neutro Abs: 2.4 10*3/uL (ref 1.7–7.7)
Neutrophils Relative %: 61 %
Platelet Count: 160 10*3/uL (ref 150–400)
RBC: 3.81 MIL/uL — ABNORMAL LOW (ref 3.87–5.11)
RDW: 12.4 % (ref 11.5–15.5)
WBC Count: 3.9 10*3/uL — ABNORMAL LOW (ref 4.0–10.5)
nRBC: 0 % (ref 0.0–0.2)

## 2019-08-05 LAB — CMP (CANCER CENTER ONLY)
ALT: 22 U/L (ref 0–44)
AST: 24 U/L (ref 15–41)
Albumin: 4.3 g/dL (ref 3.5–5.0)
Alkaline Phosphatase: 74 U/L (ref 38–126)
Anion gap: 9 (ref 5–15)
BUN: 14 mg/dL (ref 8–23)
CO2: 26 mmol/L (ref 22–32)
Calcium: 9.1 mg/dL (ref 8.9–10.3)
Chloride: 95 mmol/L — ABNORMAL LOW (ref 98–111)
Creatinine: 0.82 mg/dL (ref 0.44–1.00)
GFR, Est AFR Am: >60 mL/min (ref >60)
GFR, Estimated: >60 mL/min (ref >60)
Glucose, Bld: 97 mg/dL (ref 70–99)
Potassium: 3.9 mmol/L (ref 3.5–5.1)
Sodium: 130 mmol/L — ABNORMAL LOW (ref 135–145)
Total Bilirubin: 0.7 mg/dL (ref 0.3–1.2)
Total Protein: 6.1 g/dL — ABNORMAL LOW (ref 6.5–8.1)

## 2019-08-05 LAB — LACTATE DEHYDROGENASE: LDH: 342 U/L — ABNORMAL HIGH (ref 98–192)

## 2019-08-05 MED ORDER — DEXTROSE-NACL 5-0.45 % IV SOLN: INTRAVENOUS | Status: DC

## 2019-08-05 MED ORDER — ACETAMINOPHEN 325 MG PO TABS
650.0000 mg | ORAL_TABLET | Freq: Once | ORAL | Status: AC
  Administered 2019-08-05: 650 mg via ORAL

## 2019-08-05 MED ORDER — SODIUM CHLORIDE 0.9 % IV SOLN
INTRAVENOUS | Status: DC
  Administered 2019-08-05: 09:00:00 via INTRAVENOUS
  Filled 2019-08-05: qty 250

## 2019-08-05 MED ORDER — DEXAMETHASONE SODIUM PHOSPHATE 10 MG/ML IJ SOLN
INTRAMUSCULAR | Status: AC
  Filled 2019-08-05: qty 1

## 2019-08-05 MED ORDER — DIPHENHYDRAMINE HCL 50 MG/ML IJ SOLN
50.0000 mg | Freq: Once | INTRAMUSCULAR | Status: AC
  Administered 2019-08-05: 09:00:00 50 mg via INTRAVENOUS

## 2019-08-05 MED ORDER — DIPHENHYDRAMINE HCL 50 MG/ML IJ SOLN
INTRAMUSCULAR | Status: AC
  Filled 2019-08-05: qty 1

## 2019-08-05 MED ORDER — DEXAMETHASONE SODIUM PHOSPHATE 10 MG/ML IJ SOLN
10.0000 mg | Freq: Once | INTRAMUSCULAR | Status: AC
  Administered 2019-08-05: 09:00:00 10 mg via INTRAVENOUS

## 2019-08-05 MED ORDER — SODIUM CHLORIDE 0.9 % IV SOLN
1000.0000 mg | Freq: Once | INTRAVENOUS | Status: AC
  Administered 2019-08-05: 1000 mg via INTRAVENOUS
  Filled 2019-08-05: qty 40

## 2019-08-05 MED ORDER — ACETAMINOPHEN 325 MG PO TABS
ORAL_TABLET | ORAL | Status: AC
  Filled 2019-08-05: qty 2

## 2019-08-05 NOTE — Patient Instructions (Signed)
[facility] Discharge Instructions for Patients Receiving Chemotherapy  Today you received the following chemotherapy agents Gazyva To help prevent nausea and vomiting after your treatment, we encourage you to take your nausea medication as prescribed.  If you develop nausea and vomiting that is not controlled by your nausea medication, call the clinic.   BELOW ARE SYMPTOMS THAT SHOULD BE REPORTED IMMEDIATELY:  *FEVER GREATER THAN 100.5 F  *CHILLS WITH OR WITHOUT FEVER  NAUSEA AND VOMITING THAT IS NOT CONTROLLED WITH YOUR NAUSEA MEDICATION  *UNUSUAL SHORTNESS OF BREATH  *UNUSUAL BRUISING OR BLEEDING  TENDERNESS IN MOUTH AND THROAT WITH OR WITHOUT PRESENCE OF ULCERS  *URINARY PROBLEMS  *BOWEL PROBLEMS  UNUSUAL RASH Items with * indicate a potential emergency and should be followed up as soon as possible.  Feel free to call the clinic should you have any questions or concerns. The clinic phone number is [phone].  Please show the [facility] at check-in to the Emergency Department and triage nurse.

## 2019-08-05 NOTE — Progress Notes (Signed)
Hematology and Oncology Follow Up Visit  Kristina Ramos [id number] 1941/03/08 78 y.o. 08/05/2019   Principle Diagnosis:  Follicular lymphoplasmacytic B-cell Non-Hodgkin's Lymphoma  Current Therapy:   Gazyva/Bendamustine -  S/p cycle #6 -- completed on 02/23/2019 Gazyva -- Maintenance every 2 month--s/p cycle #2 --start on 04/06/2019    Interim History: Kristina Ramos is here today for follow-up and treatment.  She is hanging in there.  She really is bothered by the fact that the coronavirus is still around that she really cannot do all that much.  She has had no problems with weight loss.  In fact, she has gained 30 pounds in about 6 months.  She is back to her regular weight now.  She has had no issues with cough or shortness of breath.  She has had no problems with bowels or bladder.  There has been no cough.  She has had no fever.  There is been no rashes.  Her last protein studies showed an M spike of 0.3 g/dL.  Her IgM level was down to 1100 mg/dL.  Her kappa light chain with 3.6 mg/dL.  Overall, her performance status is ECOG 1.   Medications:  Allergies as of 08/05/2019      Reactions   Aspirin Other (See Comments)   REACTION: pt states that it irritates her stomach, coated ASA is ok      Medication List       Accurate as of August 05, 2019  8:33 AM. If you have any questions, ask your nurse or doctor.        amoxicillin 500 MG capsule Commonly known as: AMOXIL Take 2,000 mg by mouth as needed. only for dental procedeure   famciclovir 250 MG tablet Commonly known as: FAMVIR Take 1 tablet (250 mg total) by mouth daily.   hydrochlorothiazide 12.5 MG tablet Commonly known as: HYDRODIURIL Take 1 tablet (12.5 mg total) by mouth daily.   levothyroxine 75 MCG tablet Commonly known as: SYNTHROID TAKE 1 TABLET BY MOUTH EVERY DAY   lisinopril 10 MG tablet Commonly known as: ZESTRIL Take 1 tablet (10 mg total) by mouth daily.   LORazepam 0.5 MG tablet Commonly known as:  Ativan Take 1 tablet (0.5 mg total) by mouth every 6 (six) hours as needed (Nausea or vomiting).   ondansetron 8 MG tablet Commonly known as: Zofran Take 1 tablet (8 mg total) by mouth 2 (two) times daily. Start second day after chemotherapy. Then take as needed for nausea or vomiting.   prochlorperazine 10 MG tablet Commonly known as: COMPAZINE Take 1 tablet (10 mg total) by mouth every 6 (six) hours as needed (Nausea or vomiting).   Vitamin D 50 MCG (2000 UT) Caps Take 1 capsule by mouth daily.       Allergies:  Allergies  Allergen Reactions  . Aspirin Other (See Comments)    REACTION: pt states that it irritates her stomach, coated ASA is ok    Past Medical History, Surgical history, Social history, and Family History were reviewed and updated.  Review of Systems: Review of Systems  Constitutional: Negative.   HENT: Negative.   Eyes: Negative.   Respiratory: Negative.   Cardiovascular: Negative.   Gastrointestinal: Negative.   Genitourinary: Negative.   Musculoskeletal: Negative.   Skin: Negative.   Neurological: Negative.   Endo/Heme/Allergies: Negative.   Psychiatric/Behavioral: Negative.      Physical Exam:  vitals were not taken for this visit.   Wt Readings from Last 3 Encounters:  06/06/19  195 lb 12.8 oz (88.8 kg)  04/06/19 184 lb 1.9 oz (83.5 kg)  02/23/19 178 lb 12.8 oz (81.1 kg)    Physical Exam Vitals signs reviewed.  HENT:     Head: Normocephalic and atraumatic.  Eyes:     Pupils: Pupils are equal, round, and reactive to light.  Neck:     Musculoskeletal: Normal range of motion.  Cardiovascular:     Rate and Rhythm: Normal rate and regular rhythm.     Heart sounds: Normal heart sounds.  Pulmonary:     Effort: Pulmonary effort is normal.     Breath sounds: Normal breath sounds.  Abdominal:     General: Bowel sounds are normal.     Palpations: Abdomen is soft.     Comments: Abdominal exam shows a soft abdomen.  Bowel sounds are present.   There is no fluid wave.  There is no inguinal adenopathy.  There is no hepatomegaly.  Her spleen has shrunk down quite nicely.  I really cannot palpate her spleen..  Musculoskeletal: Normal range of motion.        General: No tenderness or deformity.  Lymphadenopathy:     Cervical: No cervical adenopathy.  Skin:    General: Skin is warm and dry.     Findings: No erythema or rash.  Neurological:     Mental Status: She is alert and oriented to person, place, and time.  Psychiatric:        Behavior: Behavior normal.        Thought Content: Thought content normal.        Judgment: Judgment normal.      Lab Results  Component Value Date   WBC 3.9 (L) 08/05/2019   HGB 12.4 08/05/2019   HCT 34.4 (L) 08/05/2019   MCV 90.3 08/05/2019   PLT 160 08/05/2019   Lab Results  Component Value Date   FERRITIN 849 (H) 11/03/2018   IRON 100 11/03/2018   TIBC 281 11/03/2018   UIBC 182 11/03/2018   IRONPCTSAT 35 11/03/2018   Lab Results  Component Value Date   RETICCTPCT 2.0 11/03/2018   RBC 3.81 (L) 08/05/2019   RETICCTABS 105,600 (H) 03/03/2018   Lab Results  Component Value Date   KPAFRELGTCHN 36.0 (H) 06/06/2019   LAMBDASER 5.4 (L) 06/06/2019   KAPLAMBRATIO 6.67 (H) 06/06/2019   Lab Results  Component Value Date   IGGSERUM 387 (L) 06/06/2019   IGGSERUM 403 (L) 06/06/2019   IGA 60 (L) 06/06/2019   IGA 65 06/06/2019   IGMSERUM 1,091 (H) 06/06/2019   IGMSERUM 1,121 (H) 06/06/2019   Lab Results  Component Value Date   TOTALPROTELP 6.5 06/06/2019   ALBUMINELP 4.0 06/06/2019   A1GS 0.2 06/06/2019   A2GS 0.6 06/06/2019   BETS 1.0 06/06/2019   GAMS 0.7 06/06/2019   MSPIKE 0.3 (H) 06/06/2019   SPEI Comment 11/03/2018     Chemistry      Component Value Date/Time   NA 130 (L) 06/06/2019 0827   K 3.9 06/06/2019 0827   CL 94 (L) 06/06/2019 0827   CO2 27 06/06/2019 0827   BUN 17 06/06/2019 0827   CREATININE 0.78 06/06/2019 0827      Component Value Date/Time   CALCIUM  9.5 06/06/2019 0827   ALKPHOS 59 06/06/2019 0827   AST 16 06/06/2019 0827   ALT 19 06/06/2019 0827   BILITOT 0.6 06/06/2019 0827       Impression and Plan: Kristina Ramos is a very pleasant 78 yo caucasian  female with 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  Clinically, she is done well.  She is responding.  Again, I am so happy that she has responded as I thought she would.  She is done a fantastic job.  We now are in the phase of maintenance therapy.  She will continue her Gazyva.  This is in the maintenance phase.  I really think this is can be helpful since she had a lot of disease when we first started treatment.  I will have her come back in 2 months for her next cycle.        Volanda Napoleon, MD 8/14/20208:33 AM

## 2019-08-06 LAB — IGG, IGA, IGM
IgA: 62 mg/dL — ABNORMAL LOW (ref 64–422)
IgG (Immunoglobin G), Serum: 405 mg/dL — ABNORMAL LOW (ref 586–1602)
IgM (Immunoglobulin M), Srm: 980 mg/dL — ABNORMAL HIGH (ref 26–217)

## 2019-08-06 LAB — BETA 2 MICROGLOBULIN, SERUM: Beta-2 Microglobulin: 2.6 mg/L — ABNORMAL HIGH (ref 0.6–2.4)

## 2019-08-08 LAB — PROTEIN ELECTROPHORESIS, SERUM
A/G Ratio: 1.7 (ref 0.7–1.7)
Albumin ELP: 4 g/dL (ref 2.9–4.4)
Alpha-1-Globulin: 0.2 g/dL (ref 0.0–0.4)
Alpha-2-Globulin: 0.6 g/dL (ref 0.4–1.0)
Beta Globulin: 1 g/dL (ref 0.7–1.3)
Gamma Globulin: 0.7 g/dL (ref 0.4–1.8)
Globulin, Total: 2.4 g/dL (ref 2.2–3.9)
M-Spike, %: 0.3 g/dL — ABNORMAL HIGH
Total Protein ELP: 6.4 g/dL (ref 6.0–8.5)

## 2019-08-08 LAB — KAPPA/LAMBDA LIGHT CHAINS
Kappa free light chain: 36.6 mg/L — ABNORMAL HIGH (ref 3.3–19.4)
Kappa, lambda light chain ratio: 5.38 — ABNORMAL HIGH (ref 0.26–1.65)
Lambda free light chains: 6.8 mg/L (ref 5.7–26.3)

## 2019-09-08 ENCOUNTER — Other Ambulatory Visit: Payer: Self-pay | Admitting: Hematology & Oncology

## 2019-09-23 ENCOUNTER — Other Ambulatory Visit: Payer: Self-pay | Admitting: Family Medicine

## 2019-09-28 ENCOUNTER — Telehealth: Payer: Self-pay

## 2019-09-28 NOTE — Telephone Encounter (Signed)
Left message for patient to call back to discuss scheduling sigmoidoscopy or OV to discuss

## 2019-09-29 NOTE — Telephone Encounter (Signed)
Patient is still on her chemo tx, but will come in and see Dr. Carlean Purl on 11/03/19

## 2019-10-05 ENCOUNTER — Other Ambulatory Visit: Payer: Self-pay

## 2019-10-05 ENCOUNTER — Inpatient Hospital Stay (HOSPITAL_BASED_OUTPATIENT_CLINIC_OR_DEPARTMENT_OTHER): Payer: Medicare Other | Admitting: Hematology & Oncology

## 2019-10-05 ENCOUNTER — Inpatient Hospital Stay: Payer: Medicare Other

## 2019-10-05 ENCOUNTER — Encounter: Payer: Self-pay | Admitting: Hematology & Oncology

## 2019-10-05 ENCOUNTER — Inpatient Hospital Stay: Payer: Medicare Other | Attending: Hematology & Oncology

## 2019-10-05 VITALS — BP 152/76 | HR 74 | Temp 97.3°F | Resp 18 | Ht 64.0 in | Wt 213.0 lb

## 2019-10-05 VITALS — BP 147/71 | HR 88 | Temp 97.5°F | Resp 17

## 2019-10-05 DIAGNOSIS — C8280 Other types of follicular lymphoma, unspecified site: Secondary | ICD-10-CM | POA: Diagnosis not present

## 2019-10-05 DIAGNOSIS — Z5112 Encounter for antineoplastic immunotherapy: Secondary | ICD-10-CM | POA: Diagnosis not present

## 2019-10-05 DIAGNOSIS — Z79899 Other long term (current) drug therapy: Secondary | ICD-10-CM | POA: Diagnosis not present

## 2019-10-05 DIAGNOSIS — C8300 Small cell B-cell lymphoma, unspecified site: Secondary | ICD-10-CM | POA: Diagnosis not present

## 2019-10-05 LAB — CBC WITH DIFFERENTIAL (CANCER CENTER ONLY)
Abs Immature Granulocytes: 0.26 10*3/uL — ABNORMAL HIGH (ref 0.00–0.07)
Basophils Absolute: 0.1 10*3/uL (ref 0.0–0.1)
Basophils Relative: 3 %
Eosinophils Absolute: 0.1 10*3/uL (ref 0.0–0.5)
Eosinophils Relative: 4 %
HCT: 35.6 % — ABNORMAL LOW (ref 36.0–46.0)
Hemoglobin: 12.7 g/dL (ref 12.0–15.0)
Immature Granulocytes: 10 %
Lymphocytes Relative: 18 %
Lymphs Abs: 0.5 10*3/uL — ABNORMAL LOW (ref 0.7–4.0)
MCH: 32 pg (ref 26.0–34.0)
MCHC: 35.7 g/dL (ref 30.0–36.0)
MCV: 89.7 fL (ref 80.0–100.0)
Monocytes Absolute: 0.3 10*3/uL (ref 0.1–1.0)
Monocytes Relative: 10 %
Neutro Abs: 1.4 10*3/uL — ABNORMAL LOW (ref 1.7–7.7)
Neutrophils Relative %: 55 %
Platelet Count: 171 10*3/uL (ref 150–400)
RBC: 3.97 MIL/uL (ref 3.87–5.11)
RDW: 12.2 % (ref 11.5–15.5)
WBC Count: 2.6 10*3/uL — ABNORMAL LOW (ref 4.0–10.5)
nRBC: 0 % (ref 0.0–0.2)

## 2019-10-05 LAB — CMP (CANCER CENTER ONLY)
ALT: 16 U/L (ref 0–44)
AST: 22 U/L (ref 15–41)
Albumin: 4.3 g/dL (ref 3.5–5.0)
Alkaline Phosphatase: 57 U/L (ref 38–126)
Anion gap: 9 (ref 5–15)
BUN: 11 mg/dL (ref 8–23)
CO2: 25 mmol/L (ref 22–32)
Calcium: 9.2 mg/dL (ref 8.9–10.3)
Chloride: 97 mmol/L — ABNORMAL LOW (ref 98–111)
Creatinine: 0.78 mg/dL (ref 0.44–1.00)
GFR, Est AFR Am: >60 mL/min (ref >60)
GFR, Estimated: >60 mL/min (ref >60)
Glucose, Bld: 108 mg/dL — ABNORMAL HIGH (ref 70–99)
Potassium: 4.2 mmol/L (ref 3.5–5.1)
Sodium: 131 mmol/L — ABNORMAL LOW (ref 135–145)
Total Bilirubin: 0.5 mg/dL (ref 0.3–1.2)
Total Protein: 6.1 g/dL — ABNORMAL LOW (ref 6.5–8.1)

## 2019-10-05 MED ORDER — DEXAMETHASONE SODIUM PHOSPHATE 10 MG/ML IJ SOLN
10.0000 mg | Freq: Once | INTRAMUSCULAR | Status: AC
  Administered 2019-10-05: 09:00:00 10 mg via INTRAVENOUS

## 2019-10-05 MED ORDER — SODIUM CHLORIDE 0.9 % IV SOLN
1000.0000 mg | Freq: Once | INTRAVENOUS | Status: AC
  Administered 2019-10-05: 10:00:00 1000 mg via INTRAVENOUS
  Filled 2019-10-05: qty 40

## 2019-10-05 MED ORDER — DIPHENHYDRAMINE HCL 50 MG/ML IJ SOLN
50.0000 mg | Freq: Once | INTRAMUSCULAR | Status: AC
  Administered 2019-10-05: 50 mg via INTRAVENOUS

## 2019-10-05 MED ORDER — DEXAMETHASONE SODIUM PHOSPHATE 10 MG/ML IJ SOLN
INTRAMUSCULAR | Status: AC
  Filled 2019-10-05: qty 1

## 2019-10-05 MED ORDER — SODIUM CHLORIDE 0.9 % IV SOLN
Freq: Once | INTRAVENOUS | Status: AC
  Administered 2019-10-05: 09:00:00 via INTRAVENOUS
  Filled 2019-10-05: qty 250

## 2019-10-05 MED ORDER — ACETAMINOPHEN 325 MG PO TABS
ORAL_TABLET | ORAL | Status: AC
  Filled 2019-10-05: qty 2

## 2019-10-05 MED ORDER — ACETAMINOPHEN 325 MG PO TABS
650.0000 mg | ORAL_TABLET | Freq: Once | ORAL | Status: AC
  Administered 2019-10-05: 09:00:00 650 mg via ORAL

## 2019-10-05 MED ORDER — DIPHENHYDRAMINE HCL 50 MG/ML IJ SOLN
INTRAMUSCULAR | Status: AC
  Filled 2019-10-05: qty 1

## 2019-10-05 NOTE — Patient Instructions (Signed)
[facility] Discharge Instructions for Patients Receiving Chemotherapy  Today you received the following chemotherapy agents Gazyva  To help prevent nausea and vomiting after your treatment, we encourage you to take your nausea medication as prescribed.  If you develop nausea and vomiting that is not controlled by your nausea medication, call the clinic.   BELOW ARE SYMPTOMS THAT SHOULD BE REPORTED IMMEDIATELY:  *FEVER GREATER THAN 100.5 F  *CHILLS WITH OR WITHOUT FEVER  NAUSEA AND VOMITING THAT IS NOT CONTROLLED WITH YOUR NAUSEA MEDICATION  *UNUSUAL SHORTNESS OF BREATH  *UNUSUAL BRUISING OR BLEEDING  TENDERNESS IN MOUTH AND THROAT WITH OR WITHOUT PRESENCE OF ULCERS  *URINARY PROBLEMS  *BOWEL PROBLEMS  UNUSUAL RASH Items with * indicate a potential emergency and should be followed up as soon as possible.  Feel free to call the clinic should you have any questions or concerns. The clinic phone number is [phone].  Please show the [facility] at check-in to the Emergency Department and triage nurse.

## 2019-10-05 NOTE — Progress Notes (Signed)
Per dr Marin Olp okay to treat today despite labs

## 2019-10-05 NOTE — Progress Notes (Signed)
At completion of infusion pt noted to have small raised area around IV catheter site. Pt denies pain but states tender to touch. No bleeding noted. Small amount of bruising noted. Pt denies any other complaints. Area does not increase with normal saline flush. Pt given warm compress and encouraged to continue for comfort. Pt aware to call with any complaints or concerns. Pt verbalized understanding and had no further questions.

## 2019-10-05 NOTE — Progress Notes (Signed)
Hematology and Oncology Follow Up Visit  Kristina Ramos [id number] April 07, 1941 78 y.o. 10/05/2019   Principle Diagnosis:  Follicular lymphoplasmacytic B-cell Non-Hodgkin's Lymphoma  Current Therapy:   Gazyva/Bendamustine -  S/p cycle #6 -- completed on 02/23/2019 Gazyva -- Maintenance every 3 month--s/p cycle #3 --start on 04/06/2019    Interim History: Kristina Ramos is here today for follow-up and treatment.  She is doing pretty well.  She really has no complaints although she now is worried about gaining too much weight.  In 6 months, she has gained 35 pounds.  She has had no abdominal pain.  There is been no change in bowel or bladder habits.  She has had no cough.  She has had no nausea or vomiting.  Her last M spike was 0.3 g/dL back in August.  Her IgM level was 980 mg/dL.  She has had no bleeding.  She has had no problems with fever.  There is no headache.  Overall, her performance status is ECOG 1.   Medications:  Allergies as of 10/05/2019      Reactions   Aspirin Other (See Comments)   REACTION: pt states that it irritates her stomach, coated ASA is ok      Medication List       Accurate as of October 05, 2019  8:18 AM. If you have any questions, ask your nurse or doctor.        amoxicillin 500 MG capsule Commonly known as: AMOXIL Take 2,000 mg by mouth as needed. only for dental procedeure   famciclovir 250 MG tablet Commonly known as: FAMVIR TAKE 1 TABLET BY MOUTH EVERY DAY   hydrochlorothiazide 12.5 MG tablet Commonly known as: HYDRODIURIL TAKE 1 TABLET BY MOUTH EVERY DAY   levothyroxine 75 MCG tablet Commonly known as: SYNTHROID TAKE 1 TABLET BY MOUTH EVERY DAY   lisinopril 10 MG tablet Commonly known as: ZESTRIL TAKE 1 TABLET BY MOUTH EVERY DAY   LORazepam 0.5 MG tablet Commonly known as: Ativan Take 1 tablet (0.5 mg total) by mouth every 6 (six) hours as needed (Nausea or vomiting).   ondansetron 8 MG tablet Commonly known as: Zofran Take 1  tablet (8 mg total) by mouth 2 (two) times daily. Start second day after chemotherapy. Then take as needed for nausea or vomiting.   prochlorperazine 10 MG tablet Commonly known as: COMPAZINE Take 1 tablet (10 mg total) by mouth every 6 (six) hours as needed (Nausea or vomiting).   Vitamin D 50 MCG (2000 UT) Caps Take 1 capsule by mouth daily.       Allergies:  Allergies  Allergen Reactions  . Aspirin Other (See Comments)    REACTION: pt states that it irritates her stomach, coated ASA is ok    Past Medical History, Surgical history, Social history, and Family History were reviewed and updated.  Review of Systems: Review of Systems  Constitutional: Negative.   HENT: Negative.   Eyes: Negative.   Respiratory: Negative.   Cardiovascular: Negative.   Gastrointestinal: Negative.   Genitourinary: Negative.   Musculoskeletal: Negative.   Skin: Negative.   Neurological: Negative.   Endo/Heme/Allergies: Negative.   Psychiatric/Behavioral: Negative.      Physical Exam:  vitals were not taken for this visit.   Wt Readings from Last 3 Encounters:  08/05/19 202 lb (91.6 kg)  06/06/19 195 lb 12.8 oz (88.8 kg)  04/06/19 184 lb 1.9 oz (83.5 kg)    Physical Exam Vitals signs reviewed.  HENT:  Head: Normocephalic and atraumatic.  Eyes:     Pupils: Pupils are equal, round, and reactive to light.  Neck:     Musculoskeletal: Normal range of motion.  Cardiovascular:     Rate and Rhythm: Normal rate and regular rhythm.     Heart sounds: Normal heart sounds.  Pulmonary:     Effort: Pulmonary effort is normal.     Breath sounds: Normal breath sounds.  Abdominal:     General: Bowel sounds are normal.     Palpations: Abdomen is soft.     Comments: Abdominal exam shows a soft abdomen.  Bowel sounds are present.  There is no fluid wave.  There is no inguinal adenopathy.  There is no hepatomegaly.  Her spleen has shrunk down quite nicely.  I really cannot palpate her spleen..   Musculoskeletal: Normal range of motion.        General: No tenderness or deformity.  Lymphadenopathy:     Cervical: No cervical adenopathy.  Skin:    General: Skin is warm and dry.     Findings: No erythema or rash.  Neurological:     Mental Status: She is alert and oriented to person, place, and time.  Psychiatric:        Behavior: Behavior normal.        Thought Content: Thought content normal.        Judgment: Judgment normal.      Lab Results  Component Value Date   WBC 3.9 (L) 08/05/2019   HGB 12.4 08/05/2019   HCT 34.4 (L) 08/05/2019   MCV 90.3 08/05/2019   PLT 160 08/05/2019   Lab Results  Component Value Date   FERRITIN 849 (H) 11/03/2018   IRON 100 11/03/2018   TIBC 281 11/03/2018   UIBC 182 11/03/2018   IRONPCTSAT 35 11/03/2018   Lab Results  Component Value Date   RETICCTPCT 2.0 11/03/2018   RBC 3.81 (L) 08/05/2019   RETICCTABS 105,600 (H) 03/03/2018   Lab Results  Component Value Date   KPAFRELGTCHN 36.6 (H) 08/05/2019   LAMBDASER 6.8 08/05/2019   KAPLAMBRATIO 5.38 (H) 08/05/2019   Lab Results  Component Value Date   IGGSERUM 405 (L) 08/05/2019   IGA 62 (L) 08/05/2019   IGMSERUM 980 (H) 08/05/2019   Lab Results  Component Value Date   TOTALPROTELP 6.4 08/05/2019   ALBUMINELP 4.0 08/05/2019   A1GS 0.2 08/05/2019   A2GS 0.6 08/05/2019   BETS 1.0 08/05/2019   GAMS 0.7 08/05/2019   MSPIKE 0.3 (H) 08/05/2019   SPEI Comment 08/05/2019     Chemistry      Component Value Date/Time   NA 130 (L) 08/05/2019 0807   K 3.9 08/05/2019 0807   CL 95 (L) 08/05/2019 0807   CO2 26 08/05/2019 0807   BUN 14 08/05/2019 0807   CREATININE 0.82 08/05/2019 0807      Component Value Date/Time   CALCIUM 9.1 08/05/2019 0807   ALKPHOS 74 08/05/2019 0807   AST 24 08/05/2019 0807   ALT 22 08/05/2019 0807   BILITOT 0.7 08/05/2019 0807       Impression and Plan: Kristina Ramos is a very pleasant 78 yo caucasian female with follicular lymphoplasmacytic lymphoma  and significant splenomegaly.  Clinically, she is done well.  She is responding.  Again, I am so happy that she has responded as I thought she would.  She is done a fantastic job.  I am going to move her Dyann Kief now to every 3 months.  I think this would be very reasonable to do.  I think this will make life easier for her.  She is happy to have to go to every 3 months.  We will get her through all the holidays now.  Hopefully, her weight will stabilize little bit.  We will get her back in 3 months.       Volanda Napoleon, MD 10/14/20208:18 AM

## 2019-10-06 LAB — IGG, IGA, IGM
IgA: 59 mg/dL — ABNORMAL LOW (ref 64–422)
IgG (Immunoglobin G), Serum: 386 mg/dL — ABNORMAL LOW (ref 586–1602)
IgM (Immunoglobulin M), Srm: 887 mg/dL — ABNORMAL HIGH (ref 26–217)

## 2019-10-06 LAB — KAPPA/LAMBDA LIGHT CHAINS
Kappa free light chain: 30 mg/L — ABNORMAL HIGH (ref 3.3–19.4)
Kappa, lambda light chain ratio: 4.92 — ABNORMAL HIGH (ref 0.26–1.65)
Lambda free light chains: 6.1 mg/L (ref 5.7–26.3)

## 2019-10-10 LAB — IMMUNOFIXATION REFLEX, SERUM
IgA: 63 mg/dL — ABNORMAL LOW (ref 64–422)
IgG (Immunoglobin G), Serum: 430 mg/dL — ABNORMAL LOW (ref 586–1602)
IgM (Immunoglobulin M), Srm: 836 mg/dL — ABNORMAL HIGH (ref 26–217)

## 2019-10-10 LAB — PROTEIN ELECTROPHORESIS, SERUM, WITH REFLEX
A/G Ratio: 1.7 (ref 0.7–1.7)
Albumin ELP: 3.9 g/dL (ref 2.9–4.4)
Alpha-1-Globulin: 0.2 g/dL (ref 0.0–0.4)
Alpha-2-Globulin: 0.5 g/dL (ref 0.4–1.0)
Beta Globulin: 0.9 g/dL (ref 0.7–1.3)
Gamma Globulin: 0.6 g/dL (ref 0.4–1.8)
Globulin, Total: 2.3 g/dL (ref 2.2–3.9)
M-Spike, %: 0.3 g/dL — ABNORMAL HIGH
SPEP Interpretation: 0
Total Protein ELP: 6.2 g/dL (ref 6.0–8.5)

## 2019-11-03 ENCOUNTER — Ambulatory Visit (INDEPENDENT_AMBULATORY_CARE_PROVIDER_SITE_OTHER): Payer: Medicare Other | Admitting: Internal Medicine

## 2019-11-03 ENCOUNTER — Encounter: Payer: Self-pay | Admitting: Internal Medicine

## 2019-11-03 VITALS — BP 110/68 | HR 72 | Temp 98.2°F | Ht 64.0 in | Wt 217.4 lb

## 2019-11-03 DIAGNOSIS — Z1159 Encounter for screening for other viral diseases: Secondary | ICD-10-CM | POA: Diagnosis not present

## 2019-11-03 DIAGNOSIS — D128 Benign neoplasm of rectum: Secondary | ICD-10-CM

## 2019-11-03 NOTE — Patient Instructions (Signed)
You have been scheduled for a flexible sigmoidoscopy. Please follow the written instructions given to you at your visit today. If you use inhalers (even only as needed), please bring them with you on the day of your procedure.    I appreciate the opportunity to care for you. Carl Gessner, MD, FACG 

## 2019-11-03 NOTE — Progress Notes (Signed)
Kristina Ramos 78 y.o. 1941/02/08 [id number]  Assessment & Plan:   Encounter Diagnoses  Name Primary?  . Adenomatous rectal polyp Yes  . Special screening examination for viral disease     Schedule flex sig to ensure adenoma removed Enema x1 is all the prep she needs  The risks and benefits as well as alternatives of endoscopic procedure(s) have been discussed and reviewed. All questions answered. The patient agrees to proceed.  We did not sedate her for the last exam  Subjective:   Chief Complaint: rectal adenoma  HPI The patient is here for follow-up of a large rectal adenoma originally removed in 2018 a follow-up flex sig showed 8 mm residual polyp.  In the interim she was diagnosed with lymphoma.  So we have not yet followed up to make sure the polyp is completely gone she is interested in doing that now.  Not interested in pursuing a total colonoscopy and I do not think that is really necessary. She is not having any active GI symptoms She continues to have active chemotherapy regimen to keep lymphoma in remission there was now only monthly  Allergies  Allergen Reactions  . Aspirin Other (See Comments)    REACTION: pt states that it irritates her stomach, coated ASA is ok   Current Meds  Medication Sig  . amoxicillin (AMOXIL) 500 MG capsule Take 2,000 mg by mouth as needed. only for dental procedeure  . Cholecalciferol (VITAMIN D) 2000 units CAPS Take 1 capsule by mouth daily.  . famciclovir (FAMVIR) 250 MG tablet TAKE 1 TABLET BY MOUTH EVERY DAY  . hydrochlorothiazide (HYDRODIURIL) 12.5 MG tablet TAKE 1 TABLET BY MOUTH EVERY DAY  . levothyroxine (SYNTHROID, LEVOTHROID) 75 MCG tablet TAKE 1 TABLET BY MOUTH EVERY DAY  . lisinopril (ZESTRIL) 10 MG tablet TAKE 1 TABLET BY MOUTH EVERY DAY  . LORazepam (ATIVAN) 0.5 MG tablet Take 1 tablet (0.5 mg total) by mouth every 6 (six) hours as needed (Nausea or vomiting).  . ondansetron (ZOFRAN) 8 MG tablet Take 1 tablet (8 mg  total) by mouth 2 (two) times daily. Start second day after chemotherapy. Then take as needed for nausea or vomiting.  . prochlorperazine (COMPAZINE) 10 MG tablet Take 1 tablet (10 mg total) by mouth every 6 (six) hours as needed (Nausea or vomiting).   Past Medical History:  Diagnosis Date  . Anemia   . Arthritis   . Depression   . Follicular lymphoma grade ii, lymph nodes of multiple sites ([facility]) 09/30/2018  . Follicular lymphoma grade ii, lymph nodes of multiple sites ([facility]) 09/30/2018  . GERD (gastroesophageal reflux disease)   . Goals of care, counseling/discussion 09/30/2018  . Heart murmur   . Hx of adenomatous colonic polyps 07/28/2017  . Hyperlipidemia   . Hypertension   . 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 Thyroid disease    Past Surgical History:  Procedure Laterality Date  . TONSILLECTOMY    . TOTAL KNEE ARTHROPLASTY Left 04/25/2014  . TUBAL LIGATION     Social History   Social History Narrative   Retired widowed 1 son one daughter    daughter lives in [place] and son lives in suburban [place] in [place]   4 caffeinated beverages daily   family history includes CAD in her mother; Cataracts in her mother; Diabetes in her maternal grandmother and paternal aunt; Heart disease in her father; Lung cancer in her father; Thyroid disease in her maternal aunt.   Review of Systems As  above  Objective:   Physical Exam BP 110/68   Pulse 72   Temp 98.2 F (36.8 C)   Ht 5\' 4"  (1.626 m)   Wt 217 lb 6.4 oz (98.6 kg)   BMI 37.32 kg/m  Elderly no acute distress white woman Lungs clear Normal heart sounds

## 2019-11-16 ENCOUNTER — Other Ambulatory Visit: Payer: Self-pay

## 2019-11-23 ENCOUNTER — Other Ambulatory Visit: Payer: Self-pay | Admitting: Internal Medicine

## 2019-11-23 ENCOUNTER — Ambulatory Visit (INDEPENDENT_AMBULATORY_CARE_PROVIDER_SITE_OTHER): Payer: Medicare Other

## 2019-11-23 DIAGNOSIS — Z1159 Encounter for screening for other viral diseases: Secondary | ICD-10-CM

## 2019-11-24 LAB — SARS CORONAVIRUS 2 (TAT 6-24 HRS): SARS Coronavirus 2: NEGATIVE

## 2019-11-25 ENCOUNTER — Other Ambulatory Visit: Payer: Self-pay

## 2019-11-25 ENCOUNTER — Ambulatory Visit (AMBULATORY_SURGERY_CENTER): Payer: Medicare Other | Admitting: Internal Medicine

## 2019-11-25 ENCOUNTER — Encounter: Payer: Self-pay | Admitting: Internal Medicine

## 2019-11-25 VITALS — BP 146/89 | HR 70 | Temp 97.4°F | Resp 16 | Ht 64.0 in | Wt 217.0 lb

## 2019-11-25 DIAGNOSIS — D129 Benign neoplasm of anus and anal canal: Secondary | ICD-10-CM

## 2019-11-25 DIAGNOSIS — D128 Benign neoplasm of rectum: Secondary | ICD-10-CM

## 2019-11-25 MED ORDER — SODIUM CHLORIDE 0.9 % IV SOLN: 500.0000 mL | INTRAVENOUS | Status: AC

## 2019-11-25 NOTE — Op Note (Signed)
[facility] Patient Name: Kristina Ramos Procedure Date: 11/25/2019 9:49 AM MRN: [id number] Endoscopist: Gatha Mayer , MD Age: 78 Referring MD:  Date of Birth: 07/13/1941 Gender: Female Account #: [id number] Procedure:                Flexible Sigmoidoscopy Indications:              Follow-up of rectal polyps of uncertain behavior Medicines:                None Procedure:                Pre-Anesthesia Assessment:                           - Prior to the procedure, a History and Physical                            was performed, and patient medications and                            allergies were reviewed. The patient's tolerance of                            previous anesthesia was also reviewed. The risks                            and benefits of the procedure and the sedation                            options and risks were discussed with the patient.                            All questions were answered, and informed consent                            was obtained. Prior Anticoagulants: The patient has                            taken no previous anticoagulant or antiplatelet                            agents. ASA Grade Assessment: II - A patient with                            mild systemic disease. After reviewing the risks                            and benefits, the patient was deemed in                            satisfactory condition to undergo the procedure.                           After obtaining informed consent, the scope was  passed under direct vision. The Endoscope was                            introduced through the anus and advanced to the the                            sigmoid colon. The flexible sigmoidoscopy was                            accomplished without difficulty. The patient                            tolerated the procedure well. The quality of the                            bowel preparation was excellent. Scope  In: 10:10:31 AM Scope Out: 10:11:42 AM Total Procedure Duration: 0 hours 1 minute 11 seconds  Findings:                 The perianal and digital rectal examinations were                            normal.                           A post polypectomy scar was found in the rectum.                            There was no evidence of the previous polyp.                           The exam was otherwise without abnormality. Complications:            No immediate complications. Estimated Blood Loss:     Estimated blood loss: none. Impression:               - Post-polypectomy scar in the rectum.                           - The examination was otherwise normal.                           - No specimens collected. Recommendation:           - Discharge patient to home.                           - Patient has a contact number available for                            emergencies. The signs and symptoms of potential                            delayed complications were discussed with the  patient. Return to normal activities tomorrow.                            Written discharge instructions were provided to the                            patient.                           - Resume previous diet.                           - Continue present medications.                           - No need for routine repeat colonoscopy Gatha Mayer, MD 11/25/2019 10:19:57 AM This report has been signed electronically.

## 2019-11-25 NOTE — Progress Notes (Signed)
Report given to PACU, vss 

## 2019-11-25 NOTE — Progress Notes (Signed)
Temp JB  vs [place] 

## 2019-11-25 NOTE — Patient Instructions (Addendum)
The polyp is all gone.  No more routine colonoscopy planned.  I appreciate the opportunity to care for you. Gatha Mayer, MD, [facility]   Discharge instructions given. Normal exam. Resume previous medications. YOU HAD AN ENDOSCOPIC PROCEDURE TODAY AT [facility]:   Refer to the procedure report that was given to you for any specific questions about what was found during the examination.  If the procedure report does not answer your questions, please call your gastroenterologist to clarify.  If you requested that your care partner not be given the details of your procedure findings, then the procedure report has been included in a sealed envelope for you to review at your convenience later.  YOU SHOULD EXPECT: Some feelings of bloating in the abdomen. Passage of more gas than usual.  Walking can help get rid of the air that was put into your GI tract during the procedure and reduce the bloating. If you had a lower endoscopy (such as a colonoscopy or flexible sigmoidoscopy) you may notice spotting of blood in your stool or on the toilet paper. If you underwent a bowel prep for your procedure, you may not have a normal bowel movement for a few days.  Please Note:  You might notice some irritation and congestion in your nose or some drainage.  This is from the oxygen used during your procedure.  There is no need for concern and it should clear up in a day or so.  SYMPTOMS TO REPORT IMMEDIATELY:   Following lower endoscopy (colonoscopy or flexible sigmoidoscopy):  Excessive amounts of blood in the stool  Significant tenderness or worsening of abdominal pains  Swelling of the abdomen that is new, acute  Fever of 100F or higher    For urgent or emergent issues, a gastroenterologist can be reached at any hour by calling [phone].   DIET:  We do recommend a small meal at first, but then you may proceed to your regular diet.  Drink plenty of fluids but you should avoid  alcoholic beverages for 24 hours.  ACTIVITY:  You should plan to take it easy for the rest of today and you should NOT DRIVE or use heavy machinery until tomorrow (because of the sedation medicines used during the test).    FOLLOW UP: Our staff will call the number listed on your records 48-72 hours following your procedure to check on you and address any questions or concerns that you may have regarding the information given to you following your procedure. If we do not reach you, we will leave a message.  We will attempt to reach you two times.  During this call, we will ask if you have developed any symptoms of COVID 19. If you develop any symptoms (ie: fever, flu-like symptoms, shortness of breath, cough etc.) before then, please call [phone].  If you test positive for Covid 19 in the 2 weeks post procedure, please call and report this information to [place].    If any biopsies were taken you will be contacted by phone or by letter within the next 1-3 weeks.  Please call us at [phone] if you have not heard about the biopsies in 3 weeks.    SIGNATURES/CONFIDENTIALITY: You and/or your care partner have signed paperwork which will be entered into your electronic medical record.  These signatures attest to the fact that that the information above on your After Visit Summary has been reviewed and is understood.  Full responsibility of the confidentiality of  this discharge information lies with you and/or your care-partner.

## 2019-11-29 ENCOUNTER — Telehealth: Payer: Self-pay | Admitting: *Deleted

## 2019-11-29 NOTE — Telephone Encounter (Signed)
  Follow up Call-  Call back number 11/25/2019 02/17/2018 07/23/2017  Post procedure Call Back phone  # [phone] hm  Permission to leave phone message Yes Yes Yes  Some recent data might be hidden     Patient questions:  Do you have a fever, pain , or abdominal swelling? no Pain Score  0 *  Have you tolerated food without any problems? Yes.    Have you been able to return to your normal activities? Yes.    Do you have any questions about your discharge instructions: Diet   No. Medications  No. Follow up visit  No.  Do you have questions or concerns about your Care? No.  Actions: * If pain score is 4 or above: No action needed, pain <4.  1. Have you developed a fever since your procedure? no  2.   Have you had an respiratory symptoms (SOB or cough) since your procedure? no  3.   Have you tested positive for COVID 19 since your procedure no  4.   Have you had any family members/close contacts diagnosed with the COVID 19 since your procedure?  no   If yes to any of these questions please route to Joylene John, RN and Alphonsa Gin, Therapist, sports.

## 2019-12-14 ENCOUNTER — Other Ambulatory Visit: Payer: Self-pay | Admitting: Family Medicine

## 2020-01-04 ENCOUNTER — Inpatient Hospital Stay: Payer: Medicare Other | Attending: Hematology & Oncology | Admitting: Hematology & Oncology

## 2020-01-04 ENCOUNTER — Inpatient Hospital Stay: Payer: Medicare Other

## 2020-01-04 ENCOUNTER — Other Ambulatory Visit: Payer: Self-pay

## 2020-01-04 ENCOUNTER — Encounter: Payer: Self-pay | Admitting: Hematology & Oncology

## 2020-01-04 VITALS — BP 152/59 | HR 89 | Temp 98.0°F | Resp 17

## 2020-01-04 VITALS — BP 151/78 | HR 81 | Temp 97.3°F | Resp 20 | Wt 226.0 lb

## 2020-01-04 DIAGNOSIS — C8280 Other types of follicular lymphoma, unspecified site: Secondary | ICD-10-CM | POA: Diagnosis not present

## 2020-01-04 DIAGNOSIS — C8300 Small cell B-cell lymphoma, unspecified site: Secondary | ICD-10-CM | POA: Diagnosis not present

## 2020-01-04 DIAGNOSIS — Z5112 Encounter for antineoplastic immunotherapy: Secondary | ICD-10-CM | POA: Insufficient documentation

## 2020-01-04 DIAGNOSIS — R161 Splenomegaly, not elsewhere classified: Secondary | ICD-10-CM | POA: Diagnosis not present

## 2020-01-04 DIAGNOSIS — Z79899 Other long term (current) drug therapy: Secondary | ICD-10-CM | POA: Insufficient documentation

## 2020-01-04 LAB — CBC WITH DIFFERENTIAL (CANCER CENTER ONLY)
Abs Immature Granulocytes: 0.19 10*3/uL — ABNORMAL HIGH (ref 0.00–0.07)
Basophils Absolute: 0.1 10*3/uL (ref 0.0–0.1)
Basophils Relative: 2 %
Eosinophils Absolute: 0.1 10*3/uL (ref 0.0–0.5)
Eosinophils Relative: 4 %
HCT: 35.7 % — ABNORMAL LOW (ref 36.0–46.0)
Hemoglobin: 12.7 g/dL (ref 12.0–15.0)
Immature Granulocytes: 6 %
Lymphocytes Relative: 16 %
Lymphs Abs: 0.5 10*3/uL — ABNORMAL LOW (ref 0.7–4.0)
MCH: 31.4 pg (ref 26.0–34.0)
MCHC: 35.6 g/dL (ref 30.0–36.0)
MCV: 88.4 fL (ref 80.0–100.0)
Monocytes Absolute: 0.3 10*3/uL (ref 0.1–1.0)
Monocytes Relative: 9 %
Neutro Abs: 2 10*3/uL (ref 1.7–7.7)
Neutrophils Relative %: 63 %
Platelet Count: 184 10*3/uL (ref 150–400)
RBC: 4.04 MIL/uL (ref 3.87–5.11)
RDW: 12.4 % (ref 11.5–15.5)
WBC Count: 3.1 10*3/uL — ABNORMAL LOW (ref 4.0–10.5)
nRBC: 0 % (ref 0.0–0.2)

## 2020-01-04 LAB — CMP (CANCER CENTER ONLY)
ALT: 14 U/L (ref 0–44)
AST: 20 U/L (ref 15–41)
Albumin: 4.3 g/dL (ref 3.5–5.0)
Alkaline Phosphatase: 69 U/L (ref 38–126)
Anion gap: 8 (ref 5–15)
BUN: 14 mg/dL (ref 8–23)
CO2: 25 mmol/L (ref 22–32)
Calcium: 9.6 mg/dL (ref 8.9–10.3)
Chloride: 100 mmol/L (ref 98–111)
Creatinine: 0.84 mg/dL (ref 0.44–1.00)
GFR, Est AFR Am: >60 mL/min (ref >60)
GFR, Estimated: >60 mL/min (ref >60)
Glucose, Bld: 133 mg/dL — ABNORMAL HIGH (ref 70–99)
Potassium: 3.7 mmol/L (ref 3.5–5.1)
Sodium: 133 mmol/L — ABNORMAL LOW (ref 135–145)
Total Bilirubin: 0.5 mg/dL (ref 0.3–1.2)
Total Protein: 6.3 g/dL — ABNORMAL LOW (ref 6.5–8.1)

## 2020-01-04 LAB — LACTATE DEHYDROGENASE: LDH: 377 U/L — ABNORMAL HIGH (ref 98–192)

## 2020-01-04 MED ORDER — ACETAMINOPHEN 325 MG PO TABS
ORAL_TABLET | ORAL | Status: AC
  Filled 2020-01-04: qty 2

## 2020-01-04 MED ORDER — DEXAMETHASONE SODIUM PHOSPHATE 10 MG/ML IJ SOLN
10.0000 mg | Freq: Once | INTRAMUSCULAR | Status: AC
  Administered 2020-01-04: 10 mg via INTRAVENOUS

## 2020-01-04 MED ORDER — DIPHENHYDRAMINE HCL 50 MG/ML IJ SOLN
INTRAMUSCULAR | Status: AC
  Filled 2020-01-04: qty 1

## 2020-01-04 MED ORDER — DEXAMETHASONE SODIUM PHOSPHATE 10 MG/ML IJ SOLN
INTRAMUSCULAR | Status: AC
  Filled 2020-01-04: qty 1

## 2020-01-04 MED ORDER — SODIUM CHLORIDE 0.9 % IV SOLN
1000.0000 mg | Freq: Once | INTRAVENOUS | Status: AC
  Administered 2020-01-04: 1000 mg via INTRAVENOUS
  Filled 2020-01-04: qty 40

## 2020-01-04 MED ORDER — DIPHENHYDRAMINE HCL 50 MG/ML IJ SOLN
50.0000 mg | Freq: Once | INTRAMUSCULAR | Status: AC
  Administered 2020-01-04: 50 mg via INTRAVENOUS

## 2020-01-04 MED ORDER — ACETAMINOPHEN 325 MG PO TABS
650.0000 mg | ORAL_TABLET | Freq: Once | ORAL | Status: AC
  Administered 2020-01-04: 09:00:00 650 mg via ORAL

## 2020-01-04 MED ORDER — SODIUM CHLORIDE 0.9 % IV SOLN
Freq: Once | INTRAVENOUS | Status: AC
  Filled 2020-01-04: qty 250

## 2020-01-04 NOTE — Patient Instructions (Signed)
[facility] Discharge Instructions for Patients Receiving Chemotherapy  Today you received the following chemotherapy agents Gazyva  To help prevent nausea and vomiting after your treatment, we encourage you to take your nausea medication as prescribed.  If you develop nausea and vomiting that is not controlled by your nausea medication, call the clinic.   BELOW ARE SYMPTOMS THAT SHOULD BE REPORTED IMMEDIATELY:  *FEVER GREATER THAN 100.5 F  *CHILLS WITH OR WITHOUT FEVER  NAUSEA AND VOMITING THAT IS NOT CONTROLLED WITH YOUR NAUSEA MEDICATION  *UNUSUAL SHORTNESS OF BREATH  *UNUSUAL BRUISING OR BLEEDING  TENDERNESS IN MOUTH AND THROAT WITH OR WITHOUT PRESENCE OF ULCERS  *URINARY PROBLEMS  *BOWEL PROBLEMS  UNUSUAL RASH Items with * indicate a potential emergency and should be followed up as soon as possible.  Feel free to call the clinic should you have any questions or concerns. The clinic phone number is [phone].  Please show the [facility] at check-in to the Emergency Department and triage nurse.

## 2020-01-04 NOTE — Progress Notes (Signed)
Hematology and Oncology Follow Up Visit  Kristina Ramos [id number] 07/27/1941 79 y.o. 01/04/2020   Principle Diagnosis:  Follicular lymphoplasmacytic B-cell Non-Hodgkin's Lymphoma  Current Therapy:   Gazyva/Bendamustine -  S/p cycle #6 -- completed on 02/23/2019 Gazyva -- Maintenance every 3 month--s/p cycle #4 --start on 04/06/2019    Interim History: Kristina Ramos is here today for follow-up and treatment.  She is doing okay.  She had a nice holiday season.  Unfortunately, her weight is up quite a bit.  She is gained about 40 pounds since April of last year.  She really cannot do all that much.  She is staying inside mostly because of the coronavirus.  She has had no problems with nausea or vomiting.  She has had no change in bowel or bladder habits.  I think that the weight gain actually less is now that she is doing well with respect to her lymphoma.  When she first presented with the lymphoma, her weight was down quite a bit.   When we last saw her back in October, her M spike was 0.3 g/dL.  Her Ig M level was 850 mg/dL.  Currently, her performance status is ECOG 1.     Medications:  Allergies as of 01/04/2020      Reactions   Aspirin Other (See Comments)   REACTION: pt states that it irritates her stomach, coated ASA is ok      Medication List       Accurate as of January 04, 2020  8:26 AM. If you have any questions, ask your nurse or doctor.        amoxicillin 500 MG capsule Commonly known as: AMOXIL Take 2,000 mg by mouth as needed. only for dental procedeure   famciclovir 250 MG tablet Commonly known as: FAMVIR TAKE 1 TABLET BY MOUTH EVERY DAY   hydrochlorothiazide 12.5 MG tablet Commonly known as: HYDRODIURIL TAKE 1 TABLET BY MOUTH EVERY DAY   levothyroxine 75 MCG tablet Commonly known as: SYNTHROID TAKE 1 TABLET BY MOUTH EVERY DAY   lisinopril 10 MG tablet Commonly known as: ZESTRIL TAKE 1 TABLET BY MOUTH EVERY DAY   LORazepam 0.5 MG tablet Commonly  known as: Ativan Take 1 tablet (0.5 mg total) by mouth every 6 (six) hours as needed (Nausea or vomiting).   ondansetron 8 MG tablet Commonly known as: Zofran Take 1 tablet (8 mg total) by mouth 2 (two) times daily. Start second day after chemotherapy. Then take as needed for nausea or vomiting.   prochlorperazine 10 MG tablet Commonly known as: COMPAZINE Take 1 tablet (10 mg total) by mouth every 6 (six) hours as needed (Nausea or vomiting).   Vitamin D 50 MCG (2000 UT) Caps Take 1 capsule by mouth daily.       Allergies:  Allergies  Allergen Reactions  . Aspirin Other (See Comments)    REACTION: pt states that it irritates her stomach, coated ASA is ok    Past Medical History, Surgical history, Social history, and Family History were reviewed and updated.  Review of Systems: Review of Systems  Constitutional: Negative.   HENT: Negative.   Eyes: Negative.   Respiratory: Negative.   Cardiovascular: Negative.   Gastrointestinal: Negative.   Genitourinary: Negative.   Musculoskeletal: Negative.   Skin: Negative.   Neurological: Negative.   Endo/Heme/Allergies: Negative.   Psychiatric/Behavioral: Negative.      Physical Exam:  vitals were not taken for this visit.   Wt Readings from Last 3 Encounters:  11/25/19 217 lb (98.4 kg)  11/03/19 217 lb 6.4 oz (98.6 kg)  10/05/19 213 lb (96.6 kg)    Physical Exam Vitals reviewed.  HENT:     Head: Normocephalic and atraumatic.  Eyes:     Pupils: Pupils are equal, round, and reactive to light.  Cardiovascular:     Rate and Rhythm: Normal rate and regular rhythm.     Heart sounds: Normal heart sounds.  Pulmonary:     Effort: Pulmonary effort is normal.     Breath sounds: Normal breath sounds.  Abdominal:     General: Bowel sounds are normal.     Palpations: Abdomen is soft.     Comments: Abdominal exam shows a soft abdomen.  Bowel sounds are present.  There is no fluid wave.  There is no inguinal adenopathy.  There  is no hepatomegaly.  Her spleen has shrunk down quite nicely.  I really cannot palpate her spleen..  Musculoskeletal:        General: No tenderness or deformity. Normal range of motion.     Cervical back: Normal range of motion.  Lymphadenopathy:     Cervical: No cervical adenopathy.  Skin:    General: Skin is warm and dry.     Findings: No erythema or rash.  Neurological:     Mental Status: She is alert and oriented to person, place, and time.  Psychiatric:        Behavior: Behavior normal.        Thought Content: Thought content normal.        Judgment: Judgment normal.      Lab Results  Component Value Date   WBC 2.6 (L) 10/05/2019   HGB 12.7 10/05/2019   HCT 35.6 (L) 10/05/2019   MCV 89.7 10/05/2019   PLT 171 10/05/2019   Lab Results  Component Value Date   FERRITIN 849 (H) 11/03/2018   IRON 100 11/03/2018   TIBC 281 11/03/2018   UIBC 182 11/03/2018   IRONPCTSAT 35 11/03/2018   Lab Results  Component Value Date   RETICCTPCT 2.0 11/03/2018   RBC 3.97 10/05/2019   RETICCTABS 105,600 (H) 03/03/2018   Lab Results  Component Value Date   KPAFRELGTCHN 30.0 (H) 10/05/2019   LAMBDASER 6.1 10/05/2019   KAPLAMBRATIO 4.92 (H) 10/05/2019   Lab Results  Component Value Date   IGGSERUM 386 (L) 10/05/2019   IGGSERUM 430 (L) 10/05/2019   IGA 59 (L) 10/05/2019   IGA 63 (L) 10/05/2019   IGMSERUM 887 (H) 10/05/2019   IGMSERUM 836 (H) 10/05/2019   Lab Results  Component Value Date   TOTALPROTELP 6.2 10/05/2019   ALBUMINELP 3.9 10/05/2019   A1GS 0.2 10/05/2019   A2GS 0.5 10/05/2019   BETS 0.9 10/05/2019   GAMS 0.6 10/05/2019   MSPIKE 0.3 (H) 10/05/2019   SPEI Comment 08/05/2019     Chemistry      Component Value Date/Time   NA 131 (L) 10/05/2019 0749   K 4.2 10/05/2019 0749   CL 97 (L) 10/05/2019 0749   CO2 25 10/05/2019 0749   BUN 11 10/05/2019 0749   CREATININE 0.78 10/05/2019 0749      Component Value Date/Time   CALCIUM 9.2 10/05/2019 0749   ALKPHOS  57 10/05/2019 0749   AST 22 10/05/2019 0749   ALT 16 10/05/2019 0749   BILITOT 0.5 10/05/2019 0749       Impression and Plan: Kristina Ramos is a very pleasant 79 yo caucasian female with follicular lymphoplasmacytic lymphoma and  significant splenomegaly.  Clinically, she is done well.  She is responding.  Again, I am so happy that she has responded as I thought she would.  She has done a fantastic job.  We will go ahead with her fifth cycle of maintenance Gazyva.  Again, her levels have been quite low.  We will see what the monoclonal levels are today.    We will plan to get her back in 3 more months.       Volanda Napoleon, MD 1/13/20218:26 AM

## 2020-01-05 LAB — KAPPA/LAMBDA LIGHT CHAINS
Kappa free light chain: 21.4 mg/L — ABNORMAL HIGH (ref 3.3–19.4)
Kappa, lambda light chain ratio: 3.45 — ABNORMAL HIGH (ref 0.26–1.65)
Lambda free light chains: 6.2 mg/L (ref 5.7–26.3)

## 2020-01-05 LAB — IGG, IGA, IGM
IgA: 60 mg/dL — ABNORMAL LOW (ref 64–422)
IgG (Immunoglobin G), Serum: 380 mg/dL — ABNORMAL LOW (ref 586–1602)
IgM (Immunoglobulin M), Srm: 626 mg/dL — ABNORMAL HIGH (ref 26–217)

## 2020-01-10 ENCOUNTER — Other Ambulatory Visit: Payer: Self-pay | Admitting: Family Medicine

## 2020-01-10 LAB — PROTEIN ELECTROPHORESIS, SERUM, WITH REFLEX
A/G Ratio: 1.9 — ABNORMAL HIGH (ref 0.7–1.7)
Albumin ELP: 4.1 g/dL (ref 2.9–4.4)
Alpha-1-Globulin: 0.2 g/dL (ref 0.0–0.4)
Alpha-2-Globulin: 0.6 g/dL (ref 0.4–1.0)
Beta Globulin: 0.9 g/dL (ref 0.7–1.3)
Gamma Globulin: 0.6 g/dL (ref 0.4–1.8)
Globulin, Total: 2.2 g/dL (ref 2.2–3.9)
M-Spike, %: 0.2 g/dL — ABNORMAL HIGH
SPEP Interpretation: 0
Total Protein ELP: 6.3 g/dL (ref 6.0–8.5)

## 2020-01-10 LAB — IMMUNOFIXATION REFLEX, SERUM
IgA: 59 mg/dL — ABNORMAL LOW (ref 64–422)
IgG (Immunoglobin G), Serum: 378 mg/dL — ABNORMAL LOW (ref 586–1602)
IgM (Immunoglobulin M), Srm: 625 mg/dL — ABNORMAL HIGH (ref 26–217)

## 2020-01-17 ENCOUNTER — Ambulatory Visit: Payer: Medicare Other

## 2020-01-20 ENCOUNTER — Other Ambulatory Visit: Payer: Self-pay | Admitting: Family Medicine

## 2020-01-25 ENCOUNTER — Encounter: Payer: Self-pay | Admitting: Family Medicine

## 2020-01-25 ENCOUNTER — Other Ambulatory Visit: Payer: Self-pay

## 2020-01-25 ENCOUNTER — Ambulatory Visit (INDEPENDENT_AMBULATORY_CARE_PROVIDER_SITE_OTHER): Payer: Medicare Other | Admitting: Family Medicine

## 2020-01-25 VITALS — BP 128/66 | HR 74

## 2020-01-25 DIAGNOSIS — I10 Essential (primary) hypertension: Secondary | ICD-10-CM

## 2020-01-25 DIAGNOSIS — E039 Hypothyroidism, unspecified: Secondary | ICD-10-CM

## 2020-01-25 MED ORDER — LISINOPRIL-HYDROCHLOROTHIAZIDE 10-12.5 MG PO TABS: 1.0000 | ORAL_TABLET | Freq: Every day | ORAL | 3 refills | Status: DC

## 2020-01-25 MED ORDER — LEVOTHYROXINE SODIUM 75 MCG PO TABS: 75.0000 ug | ORAL_TABLET | Freq: Every day | ORAL | 3 refills | Status: AC

## 2020-01-25 NOTE — Progress Notes (Signed)
Virtual Visit via Video Note  I connected with Kristina Ramos on 01/25/20 at  9:00 AM EST by a video enabled telemedicine application and verified that I am speaking with the correct person using two identifiers.  Location: Patient: home  Provider: home    I discussed the limitations of evaluation and management by telemedicine and the availability of in person appointments. The patient expressed understanding and agreed to proceed.  History of Present Illness: Pt is home with no complaints.  She is getting her covid vaccine this week #1.  She will need refills next month and needs labs done   Observations/Objective: Vitals:   01/25/20 0851  BP: 128/66  Pulse: 74   Pt is in NAD   Assessment and Plan: 1. Essential hypertension Well controlled, no changes to meds. Encouraged heart healthy diet such as the DASH diet and exercise as tolerated.  - lisinopril-hydrochlorothiazide (ZESTORETIC) 10-12.5 MG tablet; Take 1 tablet by mouth daily.  Dispense: 90 tablet; Refill: 3 - TSH; Future - Lipid panel; Future - Comprehensive metabolic panel; Future  2. Hypothyroidism, unspecified type Check labs  con't meds - levothyroxine (SYNTHROID) 75 MCG tablet; Take 1 tablet (75 mcg total) by mouth. daily.  Dispense: 90 tablet; Refill: 3 - TSH; Future   Follow Up Instructions:    I discussed the assessment and treatment plan with the patient. The patient was provided an opportunity to ask questions and all were answered. The patient agreed with the plan and demonstrated an understanding of the instructions.   The patient was advised to call back or seek an in-person evaluation if the symptoms worsen or if the condition fails to improve as anticipated.  I provided 20 minutes of non-face-to-face time during this encounter.   Ann Held, DO

## 2020-01-26 ENCOUNTER — Ambulatory Visit: Payer: Medicare Other | Attending: Internal Medicine

## 2020-01-26 DIAGNOSIS — Z23 Encounter for immunization: Secondary | ICD-10-CM | POA: Insufficient documentation

## 2020-01-26 NOTE — Progress Notes (Signed)
   Covid-19 Vaccination Clinic  Name:  Kristina Ramos    MRN: [id number] DOB: 10-03-1941  01/26/2020  [name] was observed post Covid-19 immunization for 15 minutes without incidence. She was provided with Vaccine Information Sheet and instruction to access the V-Safe system.   [name] was instructed to call 911 with any severe reactions post vaccine: Marland Kitchen Difficulty breathing  . Swelling of your face and throat  . A fast heartbeat  . A bad rash all over your body  . Dizziness and weakness    Immunizations Administered    Name Date Dose VIS Date Route   Pfizer COVID-19 Vaccine 01/26/2020  9:26 AM 0.3 mL 12/02/2019 Intramuscular   Manufacturer: [place]   Lot: [id number]   [place]: [id number]

## 2020-02-02 ENCOUNTER — Telehealth: Payer: Self-pay | Admitting: Family Medicine

## 2020-02-02 DIAGNOSIS — E538 Deficiency of other specified B group vitamins: Secondary | ICD-10-CM

## 2020-02-02 NOTE — Telephone Encounter (Signed)
Please advise 

## 2020-02-02 NOTE — Telephone Encounter (Signed)
Pt states she wants to start coming back here for her b12 shots. Please advise

## 2020-02-02 NOTE — Telephone Encounter (Signed)
That is fine--- we need to check b12 level

## 2020-02-03 NOTE — Telephone Encounter (Signed)
Spoke with pt. Pt to have labs drawn on Tuesday with a nurse visit for a B12 shot per [employer]

## 2020-02-07 ENCOUNTER — Other Ambulatory Visit: Payer: Self-pay

## 2020-02-07 ENCOUNTER — Other Ambulatory Visit (INDEPENDENT_AMBULATORY_CARE_PROVIDER_SITE_OTHER): Payer: Medicare Other

## 2020-02-07 DIAGNOSIS — E538 Deficiency of other specified B group vitamins: Secondary | ICD-10-CM | POA: Diagnosis not present

## 2020-02-07 DIAGNOSIS — E039 Hypothyroidism, unspecified: Secondary | ICD-10-CM

## 2020-02-07 DIAGNOSIS — I10 Essential (primary) hypertension: Secondary | ICD-10-CM | POA: Diagnosis not present

## 2020-02-07 LAB — LIPID PANEL
Cholesterol: 199 mg/dL (ref 0–200)
HDL: 56.3 mg/dL (ref >39.00)
LDL Cholesterol: 127 mg/dL — ABNORMAL HIGH (ref 0–99)
NonHDL: 142.59
Total CHOL/HDL Ratio: 4
Triglycerides: 79 mg/dL (ref 0.0–149.0)
VLDL: 15.8 mg/dL (ref 0.0–40.0)

## 2020-02-07 LAB — COMPREHENSIVE METABOLIC PANEL
ALT: 14 U/L (ref 0–35)
AST: 20 U/L (ref 0–37)
Albumin: 4.4 g/dL (ref 3.5–5.2)
Alkaline Phosphatase: 68 U/L (ref 39–117)
BUN: 13 mg/dL (ref 6–23)
CO2: 29 mEq/L (ref 19–32)
Calcium: 9.4 mg/dL (ref 8.4–10.5)
Chloride: 97 mEq/L (ref 96–112)
Creatinine, Ser: 0.84 mg/dL (ref 0.40–1.20)
GFR: 65.61 mL/min (ref >60.00)
Glucose, Bld: 107 mg/dL — ABNORMAL HIGH (ref 70–99)
Potassium: 4.3 mEq/L (ref 3.5–5.1)
Sodium: 134 mEq/L — ABNORMAL LOW (ref 135–145)
Total Bilirubin: 0.6 mg/dL (ref 0.2–1.2)
Total Protein: 6.5 g/dL (ref 6.0–8.3)

## 2020-02-07 LAB — VITAMIN B12: Vitamin B-12: >1500 pg/mL — ABNORMAL HIGH (ref 211–911)

## 2020-02-07 LAB — TSH: TSH: 2.5 u[IU]/mL (ref 0.35–4.50)

## 2020-02-09 ENCOUNTER — Ambulatory Visit: Payer: Medicare Other

## 2020-02-20 ENCOUNTER — Ambulatory Visit: Payer: Medicare Other | Attending: General Practice

## 2020-02-20 DIAGNOSIS — Z23 Encounter for immunization: Secondary | ICD-10-CM | POA: Insufficient documentation

## 2020-02-20 NOTE — Progress Notes (Signed)
   Covid-19 Vaccination Clinic  Name:  Kristina Ramos    MRN: [id number] DOB: 03-05-1941  02/20/2020  [name] was observed post Covid-19 immunization for 15 minutes without incidence. She was provided with Vaccine Information Sheet and instruction to access the V-Safe system.   [name] was instructed to call 911 with any severe reactions post vaccine: Marland Kitchen Difficulty breathing  . Swelling of your face and throat  . A fast heartbeat  . A bad rash all over your body  . Dizziness and weakness    Immunizations Administered    Name Date Dose VIS Date Route   Pfizer COVID-19 Vaccine 02/20/2020  1:35 PM 0.3 mL 12/02/2019 Intramuscular   Manufacturer: [place]   Lot: [id number]   [place]: [id number]

## 2020-02-23 ENCOUNTER — Ambulatory Visit: Payer: Medicare Other

## 2020-03-09 NOTE — Progress Notes (Signed)
Subjective:   Kristina Ramos is a 79 y.o. female who presents for Medicare Annual (Subsequent) preventive examination.  Review of Systems:  Home Safety/Smoke Alarms: Feels safe in home. Smoke alarms in place.  Lives alone. 1 story. Verbalizes good support system.   Female:       Mammo-pt states she will schedule.        Dexa scan-  08/11/18      CCS-07/23/17. Recall 3 months.     Objective:     Vitals: BP 138/84 (BP Location: Left Arm, Patient Position: Sitting, Cuff Size: Normal)   Pulse 70   Ht 5\' 4"  (1.626 m)   Wt 230 lb 3.2 oz (104.4 kg)   SpO2 98%   BMI 39.51 kg/m   Body mass index is 39.51 kg/m.  Advanced Directives 03/12/2020 01/04/2020 10/05/2019 08/05/2019 06/06/2019 04/06/2019 02/24/2019  Does Patient Have a Medical Advance Directive? Yes Yes Yes Yes Yes Yes Yes  Type of Paramedic of [place];Living will [place];Living will [place];Living will [place];Living will [place];Living will Living will;Healthcare Power of [place];Living will  Does patient want to make changes to medical advance directive? No - Patient declined - No - Patient declined No - Patient declined - - No - Patient declined  Copy of [place] in Chart? No - copy requested No - copy requested No - copy requested No - copy requested No - copy requested - No - copy requested    Tobacco Social History   Tobacco Use  Smoking Status Never Smoker  Smokeless Tobacco Never Used     Counseling given: Not Answered   Clinical Intake: Pain : No/denies pain     Past Medical History:  Diagnosis Date  . Anemia   . Arthritis   . Depression   . Follicular lymphoma grade ii, lymph nodes of multiple sites ([place]) 09/30/2018  . Follicular lymphoma grade ii, lymph nodes of multiple sites ([place]) 09/30/2018  . GERD (gastroesophageal reflux disease)   . Goals of  care, counseling/discussion 09/30/2018  . Heart murmur   . Hx of adenomatous colonic polyps 07/28/2017  . Hyperlipidemia   . Hypertension   . 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 Thyroid disease    Past Surgical History:  Procedure Laterality Date  . TONSILLECTOMY    . TOTAL KNEE ARTHROPLASTY Left 04/25/2014  . TUBAL LIGATION     Family History  Problem Relation Age of Onset  . Diabetes Maternal Grandmother   . Diabetes Paternal Aunt   . Lung cancer Father        smoker  . Heart disease Father   . CAD Mother   . Cataracts Mother   . Thyroid disease Maternal Aunt   . Colon cancer Neg Hx   . Esophageal cancer Neg Hx   . Pancreatic cancer Neg Hx   . Rectal cancer Neg Hx   . Stomach cancer Neg Hx    Social History   Socioeconomic History  . Marital status: Widowed    Spouse name: Not on file  . Number of children: 2  . Years of education: Not on file  . Highest education level: Not on file  Occupational History  . Occupation: retired  Tobacco Use  . Smoking status: Never Smoker  . Smokeless tobacco: Never Used  Substance and Sexual Activity  . Alcohol use: No  . Drug use: No  .  Sexual activity: Not on file  Other Topics Concern  . Not on file  Social History Narrative   Retired widowed 1 son one daughter    daughter lives in [place] and son lives in suburban [place] in [place]   4 caffeinated beverages daily   Social Determinants of Health   Financial Resource Strain:   . Difficulty of Paying Living Expenses:   Food Insecurity:   . Worried About Charity fundraiser in the Last Year:   . Arboriculturist in the Last Year:   Transportation Needs:   . Film/video editor (Medical):   Marland Kitchen Lack of Transportation (Non-Medical):   Physical Activity:   . Days of Exercise per Week:   . Minutes of Exercise per Session:   Stress:   . Feeling of Stress :   Social Connections:   . Frequency of Communication with Friends and Family:     . Frequency of Social Gatherings with Friends and Family:   . Attends Religious Services:   . Active Member of Clubs or Organizations:   . Attends Archivist Meetings:   Marland Kitchen Marital Status:     Outpatient Encounter Medications as of 03/12/2020  Medication Sig  . Cholecalciferol (VITAMIN D) 2000 units CAPS Take 1 capsule by mouth daily.  . famciclovir (FAMVIR) 250 MG tablet TAKE 1 TABLET BY MOUTH EVERY DAY  . hydrochlorothiazide (HYDRODIURIL) 12.5 MG tablet TAKE 1 TABLET BY MOUTH EVERY DAY  . levothyroxine (SYNTHROID) 75 MCG tablet Take 1 tablet (75 mcg total) by mouth daily.  Marland Kitchen lisinopril (ZESTRIL) 10 MG tablet TAKE 1 TABLET BY MOUTH EVERY DAY  . lisinopril-hydrochlorothiazide (ZESTORETIC) 10-12.5 MG tablet Take 1 tablet by mouth daily.  Marland Kitchen amoxicillin (AMOXIL) 500 MG capsule Take 2,000 mg by mouth as needed. only for dental procedeure   Facility-Administered Encounter Medications as of 03/12/2020  Medication  . 0.9 %  sodium chloride infusion    Activities of Daily Living In your present state of health, do you have any difficulty performing the following activities: 03/12/2020  Hearing? N  Vision? N  Difficulty concentrating or making decisions? N  Walking or climbing stairs? N  Dressing or bathing? N  Doing errands, shopping? N  Preparing Food and eating ? N  Using the Toilet? N  In the past six months, have you accidently leaked urine? N  Do you have problems with loss of bowel control? N  Managing your Medications? N  Managing your Finances? N  Housekeeping or managing your Housekeeping? N  Some recent data might be hidden    Patient Care Team: Carollee Herter, Alferd Apa, DO as PCP - General Marin Olp Rudell Cobb, MD as Consulting Physician (Oncology) Gatha Mayer, MD as Consulting Physician (Gastroenterology)    Assessment:   This is a routine wellness examination for Kristina Ramos. Physical assessment deferred to PCP.  Exercise Activities and Dietary  recommendations Current Exercise Habits: The patient does not participate in regular exercise at present, Exercise limited by: None identified   Diet (meal preparation, eat out, water intake, caffeinated beverages, dairy products, fruits and vegetables): in general, an "unhealthy" diet   Goals    . DIET - EAT MORE FRUITS AND VEGETABLES    . Have normal iron level and more energy.    . Increase physical activity       Fall Risk Fall Risk  03/12/2020 11/16/2019 08/10/2018 07/23/2018 03/17/2017  Falls in the past year? 1 1 No No Yes  Comment -  Emmi Telephone Survey: data to providers prior to load - Emmi Telephone Survey: data to providers prior to load -  Number falls in past yr: 0 1 - - 1  Comment - Emmi Telephone Survey Actual Response = 1 - - -  Injury with Fall? 0 0 - - No  Follow up Education provided;Falls prevention discussed - - - -   Depression Screen PHQ 2/9 Scores 03/12/2020 08/10/2018 03/17/2017 05/09/2015  PHQ - 2 Score 0 0 0 0     Cognitive Function Ad8 score reviewed for issues:  Issues making decisions:no  Less interest in hobbies / activities:no  Repeats questions, stories (family complaining):no  Trouble using ordinary gadgets (microwave, computer, phone):no  Forgets the month or year: no  Mismanaging finances: no  Remembering appts:no  Daily problems with thinking and/or memory:no Ad8 score is=0         Immunization History  Administered Date(s) Administered  . PFIZER SARS-COV-2 Vaccination 01/26/2020, 02/20/2020  . Pneumococcal Conjugate-13 03/17/2017  . Pneumococcal Polysaccharide-23 02/27/2010  . Td 03/23/2008    Screening Tests Health Maintenance  Topic Date Due  . TETANUS/TDAP  03/23/2018  . MAMMOGRAM  04/14/2019  . INFLUENZA VACCINE  11/19/2021 (Originally 07/23/2019)  . DEXA SCAN  Completed  . PNA vac Low Risk Adult  Completed      Plan:    Please schedule your next medicare wellness visit with me in 1 yr.  Continue to eat heart  healthy diet (full of fruits, vegetables, whole grains, lean protein, water--limit salt, fat, and sugar intake) and increase physical activity as tolerated.  Continue doing brain stimulating activities (puzzles, reading, adult coloring books, staying active) to keep memory sharp.   Bring a copy of your living will and/or healthcare power of attorney to your next office visit.  Please schedule your mammogram.  I have personally reviewed and noted the following in the patient's chart:   . Medical and social history . Use of alcohol, tobacco or illicit drugs  . Current medications and supplements . Functional ability and status . Nutritional status . Physical activity . Advanced directives . List of other physicians . Hospitalizations, surgeries, and ER visits in previous 12 months . Vitals . Screenings to include cognitive, depression, and falls . Referrals and appointments  In addition, I have reviewed and discussed with patient certain preventive protocols, quality metrics, and best practice recommendations. A written personalized care plan for preventive services as well as general preventive health recommendations were provided to patient.     Naaman Plummer [place], [place]  03/12/2020

## 2020-03-12 ENCOUNTER — Ambulatory Visit (INDEPENDENT_AMBULATORY_CARE_PROVIDER_SITE_OTHER): Payer: Medicare Other | Admitting: *Deleted

## 2020-03-12 ENCOUNTER — Other Ambulatory Visit: Payer: Self-pay

## 2020-03-12 ENCOUNTER — Encounter: Payer: Self-pay | Admitting: *Deleted

## 2020-03-12 VITALS — BP 138/84 | HR 70 | Ht 64.0 in | Wt 230.2 lb

## 2020-03-12 DIAGNOSIS — Z0000 Encounter for general adult medical examination without abnormal findings: Secondary | ICD-10-CM | POA: Diagnosis not present

## 2020-03-12 NOTE — Patient Instructions (Signed)
Please schedule your next medicare wellness visit with me in 1 yr.  Continue to eat heart healthy diet (full of fruits, vegetables, whole grains, lean protein, water--limit salt, fat, and sugar intake) and increase physical activity as tolerated.  Continue doing brain stimulating activities (puzzles, reading, adult coloring books, staying active) to keep memory sharp.   Bring a copy of your living will and/or healthcare power of attorney to your next office visit.  Please schedule your mammogram.   Kristina Ramos , Thank you for taking time to come for your Medicare Wellness Visit. I appreciate your ongoing commitment to your health goals. Please review the following plan we discussed and let me know if I can assist you in the future.   These are the goals we discussed: Goals    . DIET - EAT MORE FRUITS AND VEGETABLES    . Have normal iron level and more energy.    . Increase physical activity       This is a list of the screening recommended for you and due dates:  Health Maintenance  Topic Date Due  . Tetanus Vaccine  03/23/2018  . Mammogram  04/14/2019  . Flu Shot  11/19/2021*  . DEXA scan (bone density measurement)  Completed  . Pneumonia vaccines  Completed  *Topic was postponed. The date shown is not the original due date.    Preventive Care 14 Years and Older, Female Preventive care refers to lifestyle choices and visits with your health care provider that can promote health and wellness. This includes:  A yearly physical exam. This is also called an annual well check.  Regular dental and eye exams.  Immunizations.  Screening for certain conditions.  Healthy lifestyle choices, such as diet and exercise. What can I expect for my preventive care visit? Physical exam Your health care provider will check:  Height and weight. These may be used to calculate body mass index (BMI), which is a measurement that tells if you are at a healthy weight.  Heart rate and blood  pressure.  Your skin for abnormal spots. Counseling Your health care provider may ask you questions about:  Alcohol, tobacco, and drug use.  Emotional well-being.  Home and relationship well-being.  Sexual activity.  Eating habits.  History of falls.  Memory and ability to understand (cognition).  Work and work Statistician.  Pregnancy and menstrual history. What immunizations do I need?  Influenza (flu) vaccine  This is recommended every year. Tetanus, diphtheria, and pertussis (Tdap) vaccine  You may need a Td booster every 10 years. Varicella (chickenpox) vaccine  You may need this vaccine if you have not already been vaccinated. Zoster (shingles) vaccine  You may need this after age 53. Pneumococcal conjugate (PCV13) vaccine  One dose is recommended after age 87. Pneumococcal polysaccharide (PPSV23) vaccine  One dose is recommended after age 78. Measles, mumps, and rubella (MMR) vaccine  You may need at least one dose of MMR if you were born in 1957 or later. You may also need a second dose. Meningococcal conjugate (MenACWY) vaccine  You may need this if you have certain conditions. Hepatitis A vaccine  You may need this if you have certain conditions or if you travel or work in places where you may be exposed to hepatitis A. Hepatitis B vaccine  You may need this if you have certain conditions or if you travel or work in places where you may be exposed to hepatitis B. Haemophilus influenzae type b (Hib) vaccine  You may need this if you have certain conditions. You may receive vaccines as individual doses or as more than one vaccine together in one shot (combination vaccines). Talk with your health care provider about the risks and benefits of combination vaccines. What tests do I need? Blood tests  Lipid and cholesterol levels. These may be checked every 5 years, or more frequently depending on your overall health.  Hepatitis C test.  Hepatitis B  test. Screening  Lung cancer screening. You may have this screening every year starting at age 55 if you have a 30-pack-year history of smoking and currently smoke or have quit within the past 15 years.  Colorectal cancer screening. All adults should have this screening starting at age 50 and continuing until age 75. Your health care provider may recommend screening at age 45 if you are at increased risk. You will have tests every 1-10 years, depending on your results and the type of screening test.  Diabetes screening. This is done by checking your blood sugar (glucose) after you have not eaten for a while (fasting). You may have this done every 1-3 years.  Mammogram. This may be done every 1-2 years. Talk with your health care provider about how often you should have regular mammograms.  BRCA-related cancer screening. This may be done if you have a family history of breast, ovarian, tubal, or peritoneal cancers. Other tests  Sexually transmitted disease (STD) testing.  Bone density scan. This is done to screen for osteoporosis. You may have this done starting at age 65. Follow these instructions at home: Eating and drinking  Eat a diet that includes fresh fruits and vegetables, whole grains, lean protein, and low-fat dairy products. Limit your intake of foods with high amounts of sugar, saturated fats, and salt.  Take vitamin and mineral supplements as recommended by your health care provider.  Do not drink alcohol if your health care provider tells you not to drink.  If you drink alcohol: ? Limit how much you have to 0-1 drink a day. ? Be aware of how much alcohol is in your drink. In the [place] one drink equals one 12 oz bottle of beer (355 mL), one 5 oz glass of wine (148 mL), or one 1 oz glass of hard liquor (44 mL). Lifestyle  Take daily care of your teeth and gums.  Stay active. Exercise for at least 30 minutes on 5 or more days each week.  Do not use any products that  contain nicotine or tobacco, such as cigarettes, e-cigarettes, and chewing tobacco. If you need help quitting, ask your health care provider.  If you are sexually active, practice safe sex. Use a condom or other form of protection in order to prevent STIs (sexually transmitted infections).  Talk with your health care provider about taking a low-dose aspirin or statin. What's next?  Go to your health care provider once a year for a well check visit.  Ask your health care provider how often you should have your eyes and teeth checked.  Stay up to date on all vaccines. This information is not intended to replace advice given to you by your health care provider. Make sure you discuss any questions you have with your health care provider. Document Revised: 12/02/2018 Document Reviewed: 12/02/2018 [employer] Patient Education  2020 [employer]  

## 2020-03-27 NOTE — Progress Notes (Signed)
Pharmacist Chemotherapy Monitoring - Follow Up Assessment    I verify that I have reviewed each item in the below checklist:  . Regimen for the patient is scheduled for the appropriate day and plan matches scheduled date. Marland Kitchen Appropriate non-routine labs are ordered dependent on drug ordered. . If applicable, additional medications reviewed and ordered per protocol based on lifetime cumulative doses and/or treatment regimen.   Plan for follow-up and/or issues identified: No . I-vent associated with next due treatment: No . MD and/or nursing notified: No  Lorinda Copland, Jacqlyn Larsen 03/27/2020 8:01 AM

## 2020-04-03 ENCOUNTER — Inpatient Hospital Stay: Payer: Medicare Other

## 2020-04-03 ENCOUNTER — Telehealth: Payer: Self-pay | Admitting: Hematology & Oncology

## 2020-04-03 ENCOUNTER — Other Ambulatory Visit: Payer: Self-pay

## 2020-04-03 ENCOUNTER — Inpatient Hospital Stay: Payer: Medicare Other | Attending: Hematology & Oncology | Admitting: Hematology & Oncology

## 2020-04-03 ENCOUNTER — Encounter: Payer: Self-pay | Admitting: Hematology & Oncology

## 2020-04-03 VITALS — BP 118/53 | HR 74 | Resp 17

## 2020-04-03 VITALS — BP 151/64 | HR 75 | Temp 97.1°F | Resp 16 | Wt 231.0 lb

## 2020-04-03 DIAGNOSIS — C8300 Small cell B-cell lymphoma, unspecified site: Secondary | ICD-10-CM

## 2020-04-03 DIAGNOSIS — C8280 Other types of follicular lymphoma, unspecified site: Secondary | ICD-10-CM | POA: Diagnosis not present

## 2020-04-03 DIAGNOSIS — Z5112 Encounter for antineoplastic immunotherapy: Secondary | ICD-10-CM | POA: Insufficient documentation

## 2020-04-03 DIAGNOSIS — Z79899 Other long term (current) drug therapy: Secondary | ICD-10-CM | POA: Insufficient documentation

## 2020-04-03 DIAGNOSIS — R161 Splenomegaly, not elsewhere classified: Secondary | ICD-10-CM | POA: Insufficient documentation

## 2020-04-03 LAB — CBC WITH DIFFERENTIAL (CANCER CENTER ONLY)
Abs Immature Granulocytes: 0.25 10*3/uL — ABNORMAL HIGH (ref 0.00–0.07)
Basophils Absolute: 0.1 10*3/uL (ref 0.0–0.1)
Basophils Relative: 3 %
Eosinophils Absolute: 0.1 10*3/uL (ref 0.0–0.5)
Eosinophils Relative: 3 %
HCT: 37.3 % (ref 36.0–46.0)
Hemoglobin: 13 g/dL (ref 12.0–15.0)
Immature Granulocytes: 9 %
Lymphocytes Relative: 18 %
Lymphs Abs: 0.5 10*3/uL — ABNORMAL LOW (ref 0.7–4.0)
MCH: 30.7 pg (ref 26.0–34.0)
MCHC: 34.9 g/dL (ref 30.0–36.0)
MCV: 88.2 fL (ref 80.0–100.0)
Monocytes Absolute: 0.3 10*3/uL (ref 0.1–1.0)
Monocytes Relative: 11 %
Neutro Abs: 1.5 10*3/uL — ABNORMAL LOW (ref 1.7–7.7)
Neutrophils Relative %: 56 %
Platelet Count: 206 10*3/uL (ref 150–400)
RBC: 4.23 MIL/uL (ref 3.87–5.11)
RDW: 12.6 % (ref 11.5–15.5)
WBC Count: 2.7 10*3/uL — ABNORMAL LOW (ref 4.0–10.5)
nRBC: 0 % (ref 0.0–0.2)

## 2020-04-03 LAB — CMP (CANCER CENTER ONLY)
ALT: 14 U/L (ref 0–44)
AST: 19 U/L (ref 15–41)
Albumin: 4.3 g/dL (ref 3.5–5.0)
Alkaline Phosphatase: 59 U/L (ref 38–126)
Anion gap: 10 (ref 5–15)
BUN: 11 mg/dL (ref 8–23)
CO2: 24 mmol/L (ref 22–32)
Calcium: 9.5 mg/dL (ref 8.9–10.3)
Chloride: 102 mmol/L (ref 98–111)
Creatinine: 0.93 mg/dL (ref 0.44–1.00)
GFR, Est AFR Am: >60 mL/min (ref >60)
GFR, Estimated: 59 mL/min — ABNORMAL LOW (ref >60)
Glucose, Bld: 118 mg/dL — ABNORMAL HIGH (ref 70–99)
Potassium: 4 mmol/L (ref 3.5–5.1)
Sodium: 136 mmol/L (ref 135–145)
Total Bilirubin: 0.5 mg/dL (ref 0.3–1.2)
Total Protein: 6.2 g/dL — ABNORMAL LOW (ref 6.5–8.1)

## 2020-04-03 MED ORDER — SODIUM CHLORIDE 0.9 % IV SOLN
10.0000 mg | Freq: Once | INTRAVENOUS | Status: AC
  Administered 2020-04-03: 10 mg via INTRAVENOUS
  Filled 2020-04-03: qty 10

## 2020-04-03 MED ORDER — ACETAMINOPHEN 325 MG PO TABS
ORAL_TABLET | ORAL | Status: AC
  Filled 2020-04-03: qty 2

## 2020-04-03 MED ORDER — DIPHENHYDRAMINE HCL 50 MG/ML IJ SOLN
50.0000 mg | Freq: Once | INTRAMUSCULAR | Status: AC
  Administered 2020-04-03: 10:00:00 50 mg via INTRAVENOUS

## 2020-04-03 MED ORDER — SODIUM CHLORIDE 0.9 % IV SOLN
1000.0000 mg | Freq: Once | INTRAVENOUS | Status: AC
  Administered 2020-04-03: 1000 mg via INTRAVENOUS
  Filled 2020-04-03: qty 40

## 2020-04-03 MED ORDER — ACETAMINOPHEN 325 MG PO TABS
650.0000 mg | ORAL_TABLET | Freq: Once | ORAL | Status: AC
  Administered 2020-04-03: 650 mg via ORAL

## 2020-04-03 MED ORDER — DIPHENHYDRAMINE HCL 50 MG/ML IJ SOLN
INTRAMUSCULAR | Status: AC
  Filled 2020-04-03: qty 1

## 2020-04-03 NOTE — Patient Instructions (Addendum)
[facility] Discharge Instructions for Patients Receiving Chemotherapy  Today you received the following chemotherapy agents G[facility]va  To help prevent nausea and vomiting after your treatment, we encourage you to take your nausea medication as prescribed.  If you develop nausea and vomiting that is not controlled by your nausea medication, call the clinic.   BELOW ARE SYMPTOMS THAT SHOULD BE REPORTED IMMEDIATELY:  *FEVER GREATER THAN 100.5 F  *CHILLS WITH OR WITHOUT FEVER  NAUSEA AND VOMITING THAT IS NOT CONTROLLED WITH YOUR NAUSEA MEDICATION  *UNUSUAL SHORTNESS OF BREATH  *UNUSUAL BRUISING OR BLEEDING  TENDERNESS IN MOUTH AND THROAT WITH OR WITHOUT PRESENCE OF ULCERS  *URINARY PROBLEMS  *BOWEL PROBLEMS  UNUSUAL RASH Items with * indicate a potential emergency and should be followed up as soon as possible.  Feel free to call the clinic should you have any questions or concerns. The clinic phone number is [phone].  Please show the [facility] at check-in to the Emergency Department and triage nurse.  Obinutuzumab injection What is this medicine? OBINUTUZUMAB (OH bi nue TOOZ ue mab) is a monoclonal antibody. It is used to treat chronic lymphocytic leukemia (CLL) and a type of non-Hodgkin lymphoma (NHL), follicular lymphoma. This medicine may be used for other purposes; ask your health care provider or pharmacist if you have questions. COMMON BRAND NAME(S): GAZYVA What should I tell my health care provider before I take this medicine? They need to know if you have any of these conditions:  infection (especially a virus infection such as hepatitis B virus)  lung or breathing disease  heart disease  take medicines that treat or prevent blood clots  an unusual or allergic reaction to obinutuzumab, other medicines, foods, dyes, or preservatives  pregnant or trying to get pregnant  breast-feeding How should I use this medicine? This medicine is  for infusion into a vein. It is given by a health care professional in a hospital or clinic setting. Talk to your pediatrician regarding the use of this medicine in children. Special care may be needed. Overdosage: If you think you have taken too much of this medicine contact a poison control center or emergency room at once. NOTE: This medicine is only for you. Do not share this medicine with others. What if I miss a dose? Keep appointments for follow-up doses as directed. It is important not to miss your dose. Call your doctor or health care professional if you are unable to keep an appointment. What may interact with this medicine?  live virus vaccines This list may not describe all possible interactions. Give your health care provider a list of all the medicines, herbs, non-prescription drugs, or dietary supplements you use. Also tell them if you smoke, drink alcohol, or use illegal drugs. Some items may interact with your medicine. What should I watch for while using this medicine? Report any side effects that you notice during your treatment right away, such as changes in your breathing, fever, chills, dizziness or lightheadedness. These effects are more common with the first dose. Visit your prescriber or health care professional for checks on your progress. You will need to have regular blood work. Report any other side effects. The side effects of this medicine can continue after you finish your treatment. Continue your course of treatment even though you feel ill unless your doctor tells you to stop. Call your doctor or health care professional for advice if you get a fever, chills or sore throat, or other symptoms  of a cold or flu. Do not treat yourself. This drug decreases your body's ability to fight infections. Try to avoid being around people who are sick. This medicine may increase your risk to bruise or bleed. Call your doctor or health care professional if you notice any unusual  bleeding. Do not become pregnant while taking this medicine or for 6 months after stopping it. Women should inform their doctor if they wish to become pregnant or think they might be pregnant. There is a potential for serious side effects to an unborn child. Talk to your health care professional or pharmacist for more information. Do not breast-feed an infant while taking this medicine or for 6 months after stopping it. What side effects may I notice from receiving this medicine? Side effects that you should report to your doctor or health care professional as soon as possible:  allergic reactions like skin rash, itching or hives, swelling of the face, lips, or tongue  breathing problems  changes in vision  chest pain or chest tightness  confusion  dizziness  loss of balance or coordination  low blood counts - this medicine may decrease the number of white blood cells, red blood cells and platelets. You may be at increased risk for infections and bleeding.  signs of decreased platelets or bleeding - bruising, pinpoint red spots on the skin, black, tarry stools, blood in the urine  signs of infection - fever or chills, cough, sore throat, pain or trouble passing urine  signs and symptoms of liver injury like dark yellow or brown urine; general ill feeling or flu-like symptoms; light-colored stools; loss of appetite; nausea; right upper belly pain; unusually weak or tired; yellowing of the eyes or skin  trouble speaking or understanding  trouble walking  vomiting Side effects that usually do not require medical attention (report to your doctor or health care professional if they continue or are bothersome):  constipation  joint pain  muscle pain This list may not describe all possible side effects. Call your doctor for medical advice about side effects. You may report side effects to FDA at 1-800-FDA-1088. Where should I keep my medicine? This drug is only given in a hospital  or clinic and will not be stored at home. NOTE: This sheet is a summary. It may not cover all possible information. If you have questions about this medicine, talk to your doctor, pharmacist, or health care provider.  2020 Elsevier/Gold Standard (2019-03-22 15:34:53) Diphenhydramine injection What is this medicine? DIPHENHYDRAMINE (dye fen HYE dra meen) is an antihistamine. It is used to treat the symptoms of an allergic reaction and motion sickness. It is also used to treat Parkinson's disease. This medicine may be used for other purposes; ask your health care provider or pharmacist if you have questions. COMMON BRAND NAME(S): Benadryl What should I tell my health care provider before I take this medicine? They need to know if you have any of these conditions:  asthma or lung disease  glaucoma  high blood pressure or heart disease  liver disease  pain or difficulty passing urine  prostate trouble  ulcers or other stomach problems  an unusual or allergic reaction to diphenhydramine, antihistamines, other medicines foods, dyes, or preservatives  pregnant or trying to get pregnant  breast-feeding How should I use this medicine? This medicine is for injection into a vein or a muscle. It is usually given by a health care professional in a hospital or clinic setting. If you get this medicine at  home, you will be taught how to prepare and give this medicine. Use exactly as directed. Take your medicine at regular intervals. Do not take your medicine more often than directed. It is important that you put your used needles and syringes in a special sharps container. Do not put them in a trash can. If you do not have a sharps container, call your pharmacist or healthcare provider to get one. Talk to your pediatrician regarding the use of this medicine in children. While this drug may be prescribed for selected conditions, precautions do apply. This medicine is not approved for use in  newborns and premature babies. Patients over 37 years old may have a stronger reaction and need a smaller dose. Overdosage: If you think you have taken too much of this medicine contact a poison control center or emergency room at once. NOTE: This medicine is only for you. Do not share this medicine with others. What if I miss a dose? If you miss a dose, take it as soon as you can. If it is almost time for your next dose, take only that dose. Do not take double or extra doses. What may interact with this medicine? Do not take this medicine with any of the following medications:  MAOIs like Carbex, Eldepryl, Marplan, Nardil, and Parnate This medicine may also interact with the following medications:  alcohol  barbiturates, like phenobarbital  medicines for bladder spasm like oxybutynin, tolterodine  medicines for blood pressure  medicines for depression, anxiety, or psychotic disturbances  medicines for movement abnormalities or Parkinson's disease  medicines for sleep  other medicines for cold, cough or allergy  some medicines for the stomach like chlordiazepoxide, dicyclomine This list may not describe all possible interactions. Give your health care provider a list of all the medicines, herbs, non-prescription drugs, or dietary supplements you use. Also tell them if you smoke, drink alcohol, or use illegal drugs. Some items may interact with your medicine. What should I watch for while using this medicine? Your condition will be monitored carefully while you are receiving this medicine. Tell your doctor or healthcare professional if your symptoms do not start to get better or if they get worse. You may get drowsy or dizzy. Do not drive, use machinery, or do anything that needs mental alertness until you know how this medicine affects you. Do not stand or sit up quickly, especially if you are an older patient. This reduces the risk of dizzy or fainting spells. Alcohol may interfere  with the effect of this medicine. Avoid alcoholic drinks. Your mouth may get dry. Chewing sugarless gum or sucking hard candy, and drinking plenty of water may help. Contact your doctor if the problem does not go away or is severe. What side effects may I notice from receiving this medicine? Side effects that you should report to your doctor or health care professional as soon as possible:  allergic reactions like skin rash, itching or hives, swelling of the face, lips, or tongue  breathing problems  changes in vision  chills  confused, agitated, nervous  irregular or fast heartbeat  low blood pressure  seizures  tremor  trouble passing urine  unusual bleeding or bruising  unusually weak or tired Side effects that usually do not require medical attention (report to your doctor or health care professional if they continue or are bothersome):  constipation, diarrhea  drowsy  headache  loss of appetite  stomach upset, vomiting  sweating  thick mucous This list may not  describe all possible side effects. Call your doctor for medical advice about side effects. You may report side effects to FDA at 1-800-FDA-1088. Where should I keep my medicine? Keep out of the reach of children. If you are using this medicine at home, you will be instructed on how to store this medicine. Throw away any unused medicine after the expiration date on the label. NOTE: This sheet is a summary. It may not cover all possible information. If you have questions about this medicine, talk to your doctor, pharmacist, or health care provider.  2020 Elsevier/Gold Standard (2008-03-28 14:28:35) Acetaminophen tablets or caplets What is this medicine? ACETAMINOPHEN (a set a MEE noe fen) is a pain reliever. It is used to treat mild pain and fever. This medicine may be used for other purposes; ask your health care provider or pharmacist if you have questions. COMMON BRAND NAME(S): Aceta, Actamin, Anacin  Aspirin Free, Genapap, Genebs, Mapap, Pain & Fever, Pain and Fever, PAIN RELIEF, PAIN RELIEF Extra Strength, Pain Reliever, Panadol, PHARBETOL, Q-Pap, Q-Pap Extra Strength, Tylenol, Tylenol CrushableTablet, Tylenol Extra Strength, XS No Aspirin, XS Pain Reliever What should I tell my health care provider before I take this medicine? They need to know if you have any of these conditions:  if you often drink alcohol  liver disease  an unusual or allergic reaction to acetaminophen, other medicines, foods, dyes, or preservatives  pregnant or trying to get pregnant  breast-feeding How should I use this medicine? Take this medicine by mouth with a glass of water. Follow the directions on the package or prescription label. Take your medicine at regular intervals. Do not take your medicine more often than directed. Talk to your pediatrician regarding the use of this medicine in children. While this drug may be prescribed for children as young as 35 years of age for selected conditions, precautions do apply. Overdosage: If you think you have taken too much of this medicine contact a poison control center or emergency room at once. NOTE: This medicine is only for you. Do not share this medicine with others. What if I miss a dose? If you miss a dose, take it as soon as you can. If it is almost time for your next dose, take only that dose. Do not take double or extra doses. What may interact with this medicine?  alcohol  imatinib  isoniazid  other medicines with acetaminophen This list may not describe all possible interactions. Give your health care provider a list of all the medicines, herbs, non-prescription drugs, or dietary supplements you use. Also tell them if you smoke, drink alcohol, or use illegal drugs. Some items may interact with your medicine. What should I watch for while using this medicine? Tell your doctor or health care professional if the pain lasts more than 10 days (5 days for  children), if it gets worse, or if there is a new or different kind of pain. Also, check with your doctor if a fever lasts for more than 3 days. Do not take other medicines that contain acetaminophen with this medicine. Always read labels carefully. If you have questions, ask your doctor or pharmacist. If you take too much acetaminophen get medical help right away. Too much acetaminophen can be very dangerous and cause liver damage. Even if you do not have symptoms, it is important to get help right away. What side effects may I notice from receiving this medicine? Side effects that you should report to your doctor or health care professional  as soon as possible:  allergic reactions like skin rash, itching or hives, swelling of the face, lips, or tongue  breathing problems  fever or sore throat  redness, blistering, peeling or loosening of the skin, including inside the mouth  trouble passing urine or change in the amount of urine  unusual bleeding or bruising  unusually weak or tired  yellowing of the eyes or skin Side effects that usually do not require medical attention (report to your doctor or health care professional if they continue or are bothersome):  headache  nausea, stomach upset This list may not describe all possible side effects. Call your doctor for medical advice about side effects. You may report side effects to FDA at 1-800-FDA-1088. Where should I keep my medicine? Keep out of reach of children. Store at room temperature between 20 and 25 degrees C (68 and 77 degrees F). Protect from moisture and heat. Throw away any unused medicine after the expiration date. NOTE: This sheet is a summary. It may not cover all possible information. If you have questions about this medicine, talk to your doctor, pharmacist, or health care provider.  2020 Elsevier/Gold Standard (2013-08-01 12:54:16) Dexamethasone injection What is this medicine? DEXAMETHASONE (dex a METH a sone)  is a corticosteroid. It is used to treat inflammation of the skin, joints, lungs, and other organs. Common conditions treated include asthma, allergies, and arthritis. It is also used for other conditions, like blood disorders and diseases of the adrenal glands. This medicine may be used for other purposes; ask your health care provider or pharmacist if you have questions. COMMON BRAND NAME(S): Decadron, DoubleDex, Simplist Dexamethasone, Solurex What should I tell my health care provider before I take this medicine? They need to know if you have any of these conditions:  Cushing's syndrome  diabetes  glaucoma  heart disease  high blood pressure  infection like herpes, measles, tuberculosis, or chickenpox  kidney disease  liver disease  mental illness  myasthenia gravis  osteoporosis  previous heart attack  seizures  stomach or intestine problems  thyroid disease  an unusual or allergic reaction to dexamethasone, corticosteroids, other medicines, lactose, foods, dyes, or preservatives  pregnant or trying to get pregnant  breast-feeding How should I use this medicine? This medicine is for injection into a muscle, joint, lesion, soft tissue, or vein. It is given by a health care professional in a hospital or clinic setting. Talk to your pediatrician regarding the use of this medicine in children. Special care may be needed. Overdosage: If you think you have taken too much of this medicine contact a poison control center or emergency room at once. NOTE: This medicine is only for you. Do not share this medicine with others. What if I miss a dose? This may not apply. If you are having a series of injections over a prolonged period, try not to miss an appointment. Call your doctor or health care professional to reschedule if you are unable to keep an appointment. What may interact with this medicine? Do not take this medicine with any of the following medications:  live  virus vaccines This medicine may also interact with the following medications:  aminoglutethimide  amphotericin B  aspirin and aspirin-like medicines  certain antibiotics like erythromycin, clarithromycin, and troleandomycin  certain antivirals for HIV or hepatitis  certain medicines for seizures like carbamazepine, phenobarbital, phenytoin  certain medicines to treat myasthenia gravis  cholestyramine  cyclosporine  digoxin  diuretics  ephedrine  female hormones, like estrogen  or progestins and birth control pills  insulin or other medicines for diabetes  isoniazid  ketoconazole  medicines that relax muscles for surgery  mifepristone  NSAIDs, medicines for pain and inflammation, like ibuprofen or naproxen  rifampin  skin tests for allergies  thalidomide  vaccines  warfarin This list may not describe all possible interactions. Give your health care provider a list of all the medicines, herbs, non-prescription drugs, or dietary supplements you use. Also tell them if you smoke, drink alcohol, or use illegal drugs. Some items may interact with your medicine. What should I watch for while using this medicine? Visit your health care professional for regular checks on your progress. Tell your health care professional if your symptoms do not start to get better or if they get worse. Your condition will be monitored carefully while you are receiving this medicine. Wear a medical ID bracelet or chain. Carry a card that describes your disease and details of your medicine and dosage times. This medicine may increase your risk of getting an infection. Call your health care professional for advice if you get a fever, chills, or sore throat, or other symptoms of a cold or flu. Do not treat yourself. Try to avoid being around people who are sick. Call your health care professional if you are around anyone with measles, chickenpox, or if you develop sores or blisters that do not  heal properly. If you are going to need surgery or other procedures, tell your doctor or health care professional that you have taken this medicine within the last 12 months. Ask your doctor or health care professional about your diet. You may need to lower the amount of salt you eat. This medicine may increase blood sugar. Ask your healthcare provider if changes in diet or medicines are needed if you have diabetes. What side effects may I notice from receiving this medicine? Side effects that you should report to your doctor or health care professional as soon as possible:  allergic reactions like skin rash, itching or hives, swelling of the face, lips, or tongue  bloody or black, tarry stools  changes in emotions or moods  changes in vision  confusion, excitement, restlessness  depressed mood  eye pain  hallucinations  muscle weakness  severe or sudden stomach or belly pain  signs and symptoms of high blood sugar such as being more thirsty or hungry or having to urinate more than normal. You may also feel very tired or have blurry vision.  signs and symptoms of infection like fever; chills; cough; sore throat; pain or trouble passing urine  swelling of ankles, feet  unusual bruising or bleeding  wounds that do not heal Side effects that usually do not require medical attention (report to your doctor or health care professional if they continue or are bothersome):  increased appetite  increased growth of face or body hair  headache  nausea, vomiting  pain, redness, or irritation at site where injected  skin problems, acne, thin and shiny skin  trouble sleeping  weight gain This list may not describe all possible side effects. Call your doctor for medical advice about side effects. You may report side effects to FDA at 1-800-FDA-1088. Where should I keep my medicine? This medicine is given in a hospital or clinic and will not be stored at home. NOTE: This sheet  is a summary. It may not cover all possible information. If you have questions about this medicine, talk to your doctor, pharmacist, or health care  provider.  2020 Elsevier/Gold Standard (2019-06-21 13:51:58)

## 2020-04-03 NOTE — Progress Notes (Signed)
Hematology and Oncology Follow Up Visit  Kristina Ramos [id number] 1941/12/18 79 y.o. 04/03/2020   Principle Diagnosis:  Follicular lymphoplasmacytic B-cell Non-Hodgkin's Lymphoma  Current Therapy:   Gazyva/Bendamustine -  S/p cycle #6 -- completed on 02/23/2019 Gazyva -- Maintenance every 3 month--s/p cycle #4 --start on 04/06/2019    Interim History: Kristina Ramos is here today for follow-up and treatment.  Overall, she still doing quite nicely.  We saw her 3 months ago.  Unfortunately, the real problem says she keeps gaining weight.  She now is up to 231 pounds.  She did get her coronavirus vaccines.  She did have a nice Easter.  She was down with her sister in [place], [place].  She has had no issues with fever.  She has had no cough or shortness of breath.  Of note, her last monoclonal studies showed an M spike of 0.2 g/dL.  Her IgM level was 625 mg/dL.  She has had no rashes.  There is been no bleeding.  There is been no change in bowel or bladder habits.   Currently, her performance status is ECOG 1.     Medications:  Allergies as of 04/03/2020      Reactions   Aspirin Other (See Comments)   REACTION: pt states that it irritates her stomach, coated ASA is ok      Medication List       Accurate as of April 03, 2020  9:25 AM. If you have any questions, ask your nurse or doctor.        amoxicillin 500 MG capsule Commonly known as: AMOXIL Take 2,000 mg by mouth as needed. only for dental procedeure   famciclovir 250 MG tablet Commonly known as: FAMVIR TAKE 1 TABLET BY MOUTH EVERY DAY   hydrochlorothiazide 12.5 MG tablet Commonly known as: HYDRODIURIL TAKE 1 TABLET BY MOUTH EVERY DAY   levothyroxine 75 MCG tablet Commonly known as: SYNTHROID Take 1 tablet (75 mcg total) by mouth daily.   lisinopril 10 MG tablet Commonly known as: ZESTRIL TAKE 1 TABLET BY MOUTH EVERY DAY   lisinopril-hydrochlorothiazide 10-12.5 MG tablet Commonly known as:  ZESTORETIC Take 1 tablet by mouth daily.   Vitamin D 50 MCG (2000 UT) Caps Take 1 capsule by mouth daily.       Allergies:  Allergies  Allergen Reactions  . Aspirin Other (See Comments)    REACTION: pt states that it irritates her stomach, coated ASA is ok    Past Medical History, Surgical history, Social history, and Family History were reviewed and updated.  Review of Systems: Review of Systems  Constitutional: Negative.   HENT: Negative.   Eyes: Negative.   Respiratory: Negative.   Cardiovascular: Negative.   Gastrointestinal: Negative.   Genitourinary: Negative.   Musculoskeletal: Negative.   Skin: Negative.   Neurological: Negative.   Endo/Heme/Allergies: Negative.   Psychiatric/Behavioral: Negative.      Physical Exam:  weight is 231 lb (104.8 kg). Her temporal temperature is 97.1 F (36.2 C) (abnormal). Her blood pressure is 151/64 (abnormal) and her pulse is 75. Her respiration is 16 and oxygen saturation is 100%.   Wt Readings from Last 3 Encounters:  04/03/20 231 lb (104.8 kg)  03/12/20 230 lb 3.2 oz (104.4 kg)  01/04/20 226 lb (102.5 kg)    Physical Exam Vitals reviewed.  HENT:     Head: Normocephalic and atraumatic.  Eyes:     Pupils: Pupils are equal, round, and reactive to light.  Cardiovascular:  Rate and Rhythm: Normal rate and regular rhythm.     Heart sounds: Normal heart sounds.  Pulmonary:     Effort: Pulmonary effort is normal.     Breath sounds: Normal breath sounds.  Abdominal:     General: Bowel sounds are normal.     Palpations: Abdomen is soft.     Comments: Abdominal exam shows a soft abdomen.  Bowel sounds are present.  There is no fluid wave.  There is no inguinal adenopathy.  There is no hepatomegaly.  Her spleen has shrunk down quite nicely.  I really cannot palpate her spleen..  Musculoskeletal:        General: No tenderness or deformity. Normal range of motion.     Cervical back: Normal range of motion.   Lymphadenopathy:     Cervical: No cervical adenopathy.  Skin:    General: Skin is warm and dry.     Findings: No erythema or rash.  Neurological:     Mental Status: She is alert and oriented to person, place, and time.  Psychiatric:        Behavior: Behavior normal.        Thought Content: Thought content normal.        Judgment: Judgment normal.      Lab Results  Component Value Date   WBC 2.7 (L) 04/03/2020   HGB 13.0 04/03/2020   HCT 37.3 04/03/2020   MCV 88.2 04/03/2020   PLT 206 04/03/2020   Lab Results  Component Value Date   FERRITIN 849 (H) 11/03/2018   IRON 100 11/03/2018   TIBC 281 11/03/2018   UIBC 182 11/03/2018   IRONPCTSAT 35 11/03/2018   Lab Results  Component Value Date   RETICCTPCT 2.0 11/03/2018   RBC 4.23 04/03/2020   RETICCTABS 105,600 (H) 03/03/2018   Lab Results  Component Value Date   KPAFRELGTCHN 21.4 (H) 01/04/2020   LAMBDASER 6.2 01/04/2020   KAPLAMBRATIO 3.45 (H) 01/04/2020   Lab Results  Component Value Date   IGGSERUM 380 (L) 01/04/2020   IGGSERUM 378 (L) 01/04/2020   IGA 60 (L) 01/04/2020   IGA 59 (L) 01/04/2020   IGMSERUM 626 (H) 01/04/2020   IGMSERUM 625 (H) 01/04/2020   Lab Results  Component Value Date   TOTALPROTELP 6.3 01/04/2020   ALBUMINELP 4.1 01/04/2020   A1GS 0.2 01/04/2020   A2GS 0.6 01/04/2020   BETS 0.9 01/04/2020   GAMS 0.6 01/04/2020   MSPIKE 0.2 (H) 01/04/2020   SPEI Comment 08/05/2019     Chemistry      Component Value Date/Time   NA 136 04/03/2020 0803   K 4.0 04/03/2020 0803   CL 102 04/03/2020 0803   CO2 24 04/03/2020 0803   BUN 11 04/03/2020 0803   CREATININE 0.93 04/03/2020 0803      Component Value Date/Time   CALCIUM 9.5 04/03/2020 0803   ALKPHOS 59 04/03/2020 0803   AST 19 04/03/2020 0803   ALT 14 04/03/2020 0803   BILITOT 0.5 04/03/2020 0803       Impression and Plan: Kristina Ramos is a very pleasant 79 yo caucasian female with follicular lymphoplasmacytic lymphoma and significant  splenomegaly.  Clinically, she is done well.  She is responding.  Again, I am so happy that she has responded as I thought she would.  She has done a fantastic job.  We will go ahead with her 6 cycle of maintenance Gazyva.  Again, her levels have been quite low.  We will see what the  monoclonal levels are today.    We will plan to get her back in 3 more months.       Volanda Napoleon, MD 4/13/20219:25 AM

## 2020-04-03 NOTE — Telephone Encounter (Signed)
Appointments scheduled calendar printed per 4/13 los 

## 2020-04-04 LAB — IGG, IGA, IGM
IgA: 52 mg/dL — ABNORMAL LOW (ref 64–422)
IgG (Immunoglobin G), Serum: 403 mg/dL — ABNORMAL LOW (ref 586–1602)
IgM (Immunoglobulin M), Srm: 537 mg/dL — ABNORMAL HIGH (ref 26–217)

## 2020-04-04 LAB — KAPPA/LAMBDA LIGHT CHAINS
Kappa free light chain: 21.6 mg/L — ABNORMAL HIGH (ref 3.3–19.4)
Kappa, lambda light chain ratio: 3.6 — ABNORMAL HIGH (ref 0.26–1.65)
Lambda free light chains: 6 mg/L (ref 5.7–26.3)

## 2020-04-06 LAB — PROTEIN ELECTROPHORESIS, SERUM, WITH REFLEX
A/G Ratio: 1.6 (ref 0.7–1.7)
Albumin ELP: 3.9 g/dL (ref 2.9–4.4)
Alpha-1-Globulin: 0.3 g/dL (ref 0.0–0.4)
Alpha-2-Globulin: 0.7 g/dL (ref 0.4–1.0)
Beta Globulin: 1 g/dL (ref 0.7–1.3)
Gamma Globulin: 0.5 g/dL (ref 0.4–1.8)
Globulin, Total: 2.5 g/dL (ref 2.2–3.9)
M-Spike, %: 0.2 g/dL — ABNORMAL HIGH
SPEP Interpretation: 0
Total Protein ELP: 6.4 g/dL (ref 6.0–8.5)

## 2020-04-06 LAB — IMMUNOFIXATION REFLEX, SERUM
IgA: 56 mg/dL — ABNORMAL LOW (ref 64–422)
IgG (Immunoglobin G), Serum: 372 mg/dL — ABNORMAL LOW (ref 586–1602)
IgM (Immunoglobulin M), Srm: 532 mg/dL — ABNORMAL HIGH (ref 26–217)

## 2020-05-28 ENCOUNTER — Inpatient Hospital Stay (HOSPITAL_COMMUNITY)
Admission: EM | Admit: 2020-05-28 | Discharge: 2020-06-01 | DRG: 177 | Disposition: A | Discharge status: Home Health | Payer: Medicare Other | Attending: Family Medicine | Admitting: Family Medicine

## 2020-05-28 ENCOUNTER — Telehealth: Payer: Self-pay | Admitting: Family Medicine

## 2020-05-28 ENCOUNTER — Emergency Department (HOSPITAL_COMMUNITY): Payer: Medicare Other

## 2020-05-28 ENCOUNTER — Other Ambulatory Visit: Payer: Self-pay

## 2020-05-28 ENCOUNTER — Encounter (HOSPITAL_COMMUNITY): Payer: Self-pay | Admitting: Emergency Medicine

## 2020-05-28 DIAGNOSIS — D72819 Decreased white blood cell count, unspecified: Secondary | ICD-10-CM | POA: Diagnosis present

## 2020-05-28 DIAGNOSIS — Z79890 Hormone replacement therapy (postmenopausal): Secondary | ICD-10-CM

## 2020-05-28 DIAGNOSIS — D508 Other iron deficiency anemias: Secondary | ICD-10-CM

## 2020-05-28 DIAGNOSIS — R0602 Shortness of breath: Secondary | ICD-10-CM | POA: Diagnosis not present

## 2020-05-28 DIAGNOSIS — E871 Hypo-osmolality and hyponatremia: Secondary | ICD-10-CM | POA: Diagnosis present

## 2020-05-28 DIAGNOSIS — E782 Mixed hyperlipidemia: Secondary | ICD-10-CM | POA: Diagnosis present

## 2020-05-28 DIAGNOSIS — J1282 Pneumonia due to coronavirus disease 2019: Secondary | ICD-10-CM | POA: Diagnosis not present

## 2020-05-28 DIAGNOSIS — U071 COVID-19: Principal | ICD-10-CM | POA: Diagnosis present

## 2020-05-28 DIAGNOSIS — Z6838 Body mass index (BMI) 38.0-38.9, adult: Secondary | ICD-10-CM | POA: Diagnosis not present

## 2020-05-28 DIAGNOSIS — E861 Hypovolemia: Secondary | ICD-10-CM | POA: Diagnosis present

## 2020-05-28 DIAGNOSIS — D509 Iron deficiency anemia, unspecified: Secondary | ICD-10-CM | POA: Diagnosis present

## 2020-05-28 DIAGNOSIS — E039 Hypothyroidism, unspecified: Secondary | ICD-10-CM | POA: Diagnosis present

## 2020-05-28 DIAGNOSIS — J069 Acute upper respiratory infection, unspecified: Secondary | ICD-10-CM | POA: Diagnosis present

## 2020-05-28 DIAGNOSIS — C8300 Small cell B-cell lymphoma, unspecified site: Secondary | ICD-10-CM | POA: Diagnosis not present

## 2020-05-28 DIAGNOSIS — C8218 Follicular lymphoma grade II, lymph nodes of multiple sites: Secondary | ICD-10-CM | POA: Diagnosis present

## 2020-05-28 DIAGNOSIS — J189 Pneumonia, unspecified organism: Secondary | ICD-10-CM | POA: Diagnosis not present

## 2020-05-28 DIAGNOSIS — I10 Essential (primary) hypertension: Secondary | ICD-10-CM | POA: Diagnosis present

## 2020-05-28 DIAGNOSIS — J9601 Acute respiratory failure with hypoxia: Secondary | ICD-10-CM | POA: Diagnosis present

## 2020-05-28 DIAGNOSIS — E876 Hypokalemia: Secondary | ICD-10-CM | POA: Diagnosis present

## 2020-05-28 DIAGNOSIS — K219 Gastro-esophageal reflux disease without esophagitis: Secondary | ICD-10-CM | POA: Diagnosis present

## 2020-05-28 DIAGNOSIS — R0902 Hypoxemia: Secondary | ICD-10-CM | POA: Diagnosis not present

## 2020-05-28 DIAGNOSIS — Z79899 Other long term (current) drug therapy: Secondary | ICD-10-CM

## 2020-05-28 DIAGNOSIS — E669 Obesity, unspecified: Secondary | ICD-10-CM | POA: Diagnosis present

## 2020-05-28 DIAGNOSIS — R05 Cough: Secondary | ICD-10-CM | POA: Diagnosis not present

## 2020-05-28 DIAGNOSIS — Z96652 Presence of left artificial knee joint: Secondary | ICD-10-CM | POA: Diagnosis present

## 2020-05-28 DIAGNOSIS — E785 Hyperlipidemia, unspecified: Secondary | ICD-10-CM | POA: Diagnosis present

## 2020-05-28 DIAGNOSIS — E872 Acidosis: Secondary | ICD-10-CM | POA: Diagnosis present

## 2020-05-28 LAB — SARS CORONAVIRUS 2 BY RT PCR (HOSPITAL ORDER, PERFORMED IN ~~LOC~~ HOSPITAL LAB): SARS Coronavirus 2: POSITIVE — AB

## 2020-05-28 LAB — BASIC METABOLIC PANEL
Anion gap: 13 (ref 5–15)
BUN: 10 mg/dL (ref 8–23)
CO2: 19 mmol/L — ABNORMAL LOW (ref 22–32)
Calcium: 8 mg/dL — ABNORMAL LOW (ref 8.9–10.3)
Chloride: 91 mmol/L — ABNORMAL LOW (ref 98–111)
Creatinine, Ser: 0.71 mg/dL (ref 0.44–1.00)
GFR calc Af Amer: >60 mL/min (ref >60)
GFR calc non Af Amer: >60 mL/min (ref >60)
Glucose, Bld: 96 mg/dL (ref 70–99)
Potassium: 3.7 mmol/L (ref 3.5–5.1)
Sodium: 123 mmol/L — ABNORMAL LOW (ref 135–145)

## 2020-05-28 LAB — CBC WITH DIFFERENTIAL/PLATELET
Abs Immature Granulocytes: 0.04 10*3/uL (ref 0.00–0.07)
Basophils Absolute: 0 10*3/uL (ref 0.0–0.1)
Basophils Relative: 1 %
Eosinophils Absolute: 0 10*3/uL (ref 0.0–0.5)
Eosinophils Relative: 0 %
HCT: 31.1 % — ABNORMAL LOW (ref 36.0–46.0)
Hemoglobin: 11.2 g/dL — ABNORMAL LOW (ref 12.0–15.0)
Immature Granulocytes: 2 %
Lymphocytes Relative: 8 %
Lymphs Abs: 0.2 10*3/uL — ABNORMAL LOW (ref 0.7–4.0)
MCH: 29.3 pg (ref 26.0–34.0)
MCHC: 36 g/dL (ref 30.0–36.0)
MCV: 81.4 fL (ref 80.0–100.0)
Monocytes Absolute: 0.4 10*3/uL (ref 0.1–1.0)
Monocytes Relative: 20 %
Neutro Abs: 1.5 10*3/uL — ABNORMAL LOW (ref 1.7–7.7)
Neutrophils Relative %: 69 %
Platelets: 240 10*3/uL (ref 150–400)
RBC: 3.82 MIL/uL — ABNORMAL LOW (ref 3.87–5.11)
RDW: 12.3 % (ref 11.5–15.5)
WBC: 2.2 10*3/uL — ABNORMAL LOW (ref 4.0–10.5)
nRBC: 0 % (ref 0.0–0.2)

## 2020-05-28 MED ORDER — GUAIFENESIN-DM 100-10 MG/5ML PO SYRP
10.0000 mL | ORAL_SOLUTION | ORAL | Status: DC | PRN
  Administered 2020-05-29 – 2020-06-01 (×2): 10 mL via ORAL
  Filled 2020-05-28 (×2): qty 10

## 2020-05-28 MED ORDER — ONDANSETRON HCL 4 MG/2ML IJ SOLN: 4.0000 mg | Freq: Four times a day (QID) | INTRAMUSCULAR | Status: DC | PRN

## 2020-05-28 MED ORDER — ZINC SULFATE 220 (50 ZN) MG PO CAPS
220.0000 mg | ORAL_CAPSULE | Freq: Every day | ORAL | Status: DC
  Administered 2020-05-29 – 2020-06-01 (×4): 220 mg via ORAL
  Filled 2020-05-28 (×4): qty 1

## 2020-05-28 MED ORDER — ADULT MULTIVITAMIN W/MINERALS CH
1.0000 | ORAL_TABLET | Freq: Every day | ORAL | Status: DC
  Administered 2020-05-29 – 2020-06-01 (×4): 1 via ORAL
  Filled 2020-05-28 (×4): qty 1

## 2020-05-28 MED ORDER — ACETAMINOPHEN 650 MG RE SUPP: 650.0000 mg | Freq: Four times a day (QID) | RECTAL | Status: DC | PRN

## 2020-05-28 MED ORDER — FOLIC ACID 1 MG PO TABS
1.0000 mg | ORAL_TABLET | Freq: Every day | ORAL | Status: DC
  Administered 2020-05-29 – 2020-06-01 (×4): 1 mg via ORAL
  Filled 2020-05-28 (×4): qty 1

## 2020-05-28 MED ORDER — ASCORBIC ACID 500 MG PO TABS
500.0000 mg | ORAL_TABLET | Freq: Every day | ORAL | Status: DC
  Administered 2020-05-29 – 2020-06-01 (×4): 500 mg via ORAL
  Filled 2020-05-28 (×4): qty 1

## 2020-05-28 MED ORDER — IPRATROPIUM-ALBUTEROL 20-100 MCG/ACT IN AERS
1.0000 | INHALATION_SPRAY | Freq: Four times a day (QID) | RESPIRATORY_TRACT | Status: DC
  Administered 2020-05-29 – 2020-06-01 (×13): 1 via RESPIRATORY_TRACT
  Filled 2020-05-28: qty 4

## 2020-05-28 MED ORDER — ENOXAPARIN SODIUM 40 MG/0.4ML ~~LOC~~ SOLN: 40.0000 mg | SUBCUTANEOUS | Status: DC

## 2020-05-28 MED ORDER — ONDANSETRON HCL 4 MG PO TABS: 4.0000 mg | ORAL_TABLET | Freq: Four times a day (QID) | ORAL | Status: DC | PRN

## 2020-05-28 MED ORDER — DEXAMETHASONE SODIUM PHOSPHATE 10 MG/ML IJ SOLN
6.0000 mg | INTRAMUSCULAR | Status: DC
  Filled 2020-05-28: qty 1

## 2020-05-28 MED ORDER — SODIUM CHLORIDE 0.9 % IV SOLN
100.0000 mg | INTRAVENOUS | Status: AC
  Administered 2020-05-28 (×2): 100 mg via INTRAVENOUS
  Filled 2020-05-28 (×2): qty 20

## 2020-05-28 MED ORDER — ACETAMINOPHEN 325 MG PO TABS
650.0000 mg | ORAL_TABLET | Freq: Four times a day (QID) | ORAL | Status: DC | PRN
  Administered 2020-05-31 – 2020-06-01 (×2): 650 mg via ORAL
  Filled 2020-05-28 (×2): qty 2

## 2020-05-28 MED ORDER — DEXAMETHASONE SODIUM PHOSPHATE 10 MG/ML IJ SOLN
10.0000 mg | Freq: Once | INTRAMUSCULAR | Status: AC
  Administered 2020-05-28: 10 mg via INTRAVENOUS
  Filled 2020-05-28: qty 1

## 2020-05-28 MED ORDER — ALBUTEROL SULFATE HFA 108 (90 BASE) MCG/ACT IN AERS: 2.0000 | INHALATION_SPRAY | RESPIRATORY_TRACT | Status: DC | PRN

## 2020-05-28 MED ORDER — SODIUM CHLORIDE 0.9 % IV SOLN
100.0000 mg | Freq: Every day | INTRAVENOUS | Status: AC
  Administered 2020-05-29 – 2020-06-01 (×4): 100 mg via INTRAVENOUS
  Filled 2020-05-28 (×4): qty 20

## 2020-05-28 MED ORDER — LEVOTHYROXINE SODIUM 50 MCG PO TABS
75.0000 ug | ORAL_TABLET | Freq: Every day | ORAL | Status: DC
  Administered 2020-05-29 – 2020-06-01 (×4): 75 ug via ORAL
  Filled 2020-05-28 (×4): qty 1

## 2020-05-28 MED ORDER — ENOXAPARIN SODIUM 60 MG/0.6ML ~~LOC~~ SOLN
50.0000 mg | SUBCUTANEOUS | Status: DC
  Administered 2020-05-29 – 2020-06-01 (×4): 50 mg via SUBCUTANEOUS
  Filled 2020-05-28 (×3): qty 0.6

## 2020-05-28 MED ORDER — HYDROCOD POLST-CPM POLST ER 10-8 MG/5ML PO SUER
5.0000 mL | Freq: Two times a day (BID) | ORAL | Status: DC
  Administered 2020-05-29 – 2020-06-01 (×8): 5 mL via ORAL
  Filled 2020-05-28 (×8): qty 5

## 2020-05-28 MED ORDER — SODIUM CHLORIDE 0.9 % IV SOLN: INTRAVENOUS | Status: DC

## 2020-05-28 NOTE — ED Triage Notes (Signed)
Pt BIBA from urgent care, reports dry cough w/ no production. States this has been causing SOB. Rapid test was positive.  94-96% 2 L   100.4 T  CBG 107 BP 150/90 20-24 RR P 88

## 2020-05-28 NOTE — ED Notes (Signed)
Patient's oxygen sat dropped to 88% on room air when ambulating.  Placed back on 2L [place].

## 2020-05-28 NOTE — Telephone Encounter (Signed)
FYI

## 2020-05-28 NOTE — ED Provider Notes (Signed)
[facility] DEPT Provider Note   CSN: [id number] Arrival date & time: 05/28/20  1508     History Chief Complaint  Patient presents with  . Cough    Kristina Ramos is a 79 y.o. female with PMH/o Follicular lymphoma grade II, GERD, anemia, HLD, HTN who presents for evaluation of cough and shortness of breath.  She states for about a couple weeks, she has had cough.  Cough is not productive and denies any hemoptysis.  She reports that when she starts coughing, she gets into a fit where she feels like she cannot breathe.  She has not noted any fevers or chest pain.  She went to urgent care today for evaluation of her symptoms and had a Covid test that was positive.  She apparently was having low oxygen though she does not know what the number was but EMS reports that she was requiring 2 L to be 94-96%.  She has no oxygen requirement at home.  She did get her Covid vaccines.  She denies any abdominal pain, nausea/vomiting or any other worsening or concerning symptoms.  The history is provided by the patient.       Past Medical History:  Diagnosis Date  . Anemia   . Arthritis   . Depression   . Follicular lymphoma grade ii, lymph nodes of multiple sites ([place]) 09/30/2018  . Follicular lymphoma grade ii, lymph nodes of multiple sites ([place]) 09/30/2018  . GERD (gastroesophageal reflux disease)   . Goals of care, counseling/discussion 09/30/2018  . Heart murmur   . Hx of adenomatous colonic polyps 07/28/2017  . Hyperlipidemia   . Hypertension   . 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 Thyroid disease     Patient Active Problem List   Diagnosis Date Noted  . Pneumonia due to COVID-19 virus 05/28/2020  . Low vitamin B12 level 10/05/2018  . 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 Goals of care, counseling/discussion 09/30/2018  . Follicular lymphoma grade ii, lymph nodes of multiple sites ([place]) 09/30/2018  . Iron  deficiency anemia 03/18/2018  . Iron deficiency anemia due to chronic blood loss 02/12/2018  . Hx of adenomatous colonic polyps 07/28/2017  . Fungal dermatitis 05/09/2015  . Rib pain on left side 01/02/2015  . Obesity (BMI 30-39.9) 12/27/2013  . OA (osteoarthritis) of knee 12/27/2013  . 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 HEADACHE 01/21/2010  . LACERATION OF FINGER 03/19/2009  . ARTHROSCOPY, LEFT KNEE, HX OF 03/23/2008  . HYPERLIPIDEMIA 12/14/2007  . DYSMETABOLIC SYNDROME X 40/98/1191  . Hypothyroidism 11/26/2007  . DEPRESSION 11/26/2007  . Essential hypertension, benign 11/26/2007  . TONSILLECTOMY AND ADENOIDECTOMY, HX OF 11/26/2007    Past Surgical History:  Procedure Laterality Date  . TONSILLECTOMY    . TOTAL KNEE ARTHROPLASTY Left 04/25/2014  . TUBAL LIGATION       OB History   No obstetric history on file.     Family History  Problem Relation Age of Onset  . Diabetes Maternal Grandmother   . Diabetes Paternal Aunt   . Lung cancer Father        smoker  . Heart disease Father   . CAD Mother   . Cataracts Mother   . Thyroid disease Maternal Aunt   . Colon cancer Neg Hx   . Esophageal cancer Neg Hx   . Pancreatic cancer Neg Hx   . Rectal cancer Neg Hx   . Stomach cancer Neg Hx     Social History  Tobacco Use  . Smoking status: Never Smoker  . Smokeless tobacco: Never Used  Substance Use Topics  . Alcohol use: No  . Drug use: No    Home Medications Prior to Admission medications   Medication Sig Start Date End Date Taking? Authorizing Provider  amoxicillin (AMOXIL) 500 MG capsule Take 2,000 mg by mouth as needed. only for dental procedeure   Yes [provider]  Cholecalciferol (VITAMIN D) 2000 units CAPS Take 1 capsule by mouth daily.   Yes [provider]  doxycycline (VIBRA-TABS) 100 MG tablet Take 100 mg by mouth 2 (two) times daily. 05/22/20  Yes [provider]  famciclovir (FAMVIR) 250 MG tablet TAKE 1 TABLET BY MOUTH  EVERY DAY Patient taking differently: Take 250 mg by mouth daily.  09/08/19  Yes Volanda Napoleon, MD  hydrochlorothiazide (HYDRODIURIL) 12.5 MG tablet TAKE 1 TABLET BY MOUTH EVERY DAY 01/11/20  Yes Ann Held, DO  levothyroxine (SYNTHROID) 75 MCG tablet Take 1 tablet (75 mcg total) by mouth daily. 01/25/20  Yes Carollee Herter, Kendrick Fries R, DO  lisinopril (ZESTRIL) 10 MG tablet TAKE 1 TABLET BY MOUTH EVERY DAY 01/11/20  Yes Roma Schanz R, DO  lisinopril-hydrochlorothiazide (ZESTORETIC) 10-12.5 MG tablet Take 1 tablet by mouth daily. Patient not taking: Reported on 05/28/2020 01/25/20   Ann Held, DO    Allergies    Aspirin  Review of Systems   Review of Systems  Constitutional: Negative for fever.  Respiratory: Positive for cough and shortness of breath.   Cardiovascular: Negative for chest pain.  Gastrointestinal: Negative for abdominal pain, nausea and vomiting.  Genitourinary: Negative for dysuria and hematuria.  Neurological: Negative for headaches.  All other systems reviewed and are negative.   Physical Exam Updated Vital Signs BP (!) 170/85   Pulse 86   Temp 99.2 F (37.3 C) (Oral)   Resp 20   Ht 5\' 4"  (1.626 m)   Wt 99.8 kg   SpO2 93%   BMI 37.76 kg/m   Physical Exam Vitals and nursing note reviewed.  Constitutional:      Appearance: Normal appearance. She is well-developed.  HENT:     Head: Normocephalic and atraumatic.  Eyes:     General: Lids are normal.     Conjunctiva/sclera: Conjunctivae normal.     Pupils: Pupils are equal, round, and reactive to light.  Cardiovascular:     Rate and Rhythm: Normal rate and regular rhythm.     Pulses: Normal pulses.     Heart sounds: Normal heart sounds. No murmur. No friction rub. No gallop.   Pulmonary:     Effort: Pulmonary effort is normal.     Breath sounds: Normal breath sounds.     Comments: Lungs clear to auscultation bilaterally.  Symmetric chest rise.  No wheezing, rales, rhonchi. Able to  speak in full sentences without any difficulty. No evidence of respiratory distress.  Abdominal:     Palpations: Abdomen is soft. Abdomen is not rigid.     Tenderness: There is no abdominal tenderness. There is no guarding.     Comments: Abdomen is soft, non-distended, non-tender. No rigidity, No guarding. No peritoneal signs.  Musculoskeletal:        General: Normal range of motion.     Cervical back: Full passive range of motion without pain.  Skin:    General: Skin is warm and dry.     Capillary Refill: Capillary refill takes less than 2 seconds.  Neurological:  Mental Status: She is alert and oriented to person, place, and time.  Psychiatric:        Speech: Speech normal.     ED Results / Procedures / Treatments   Labs (all labs ordered are listed, but only abnormal results are displayed) Labs Reviewed  BASIC METABOLIC PANEL - Abnormal; Notable for the following components:      Result Value   Sodium 123 (*)    Chloride 91 (*)    CO2 19 (*)    Calcium 8.0 (*)    All other components within normal limits  CBC WITH DIFFERENTIAL/PLATELET - Abnormal; Notable for the following components:   WBC 2.2 (*)    RBC 3.82 (*)    Hemoglobin 11.2 (*)    HCT 31.1 (*)    Neutro Abs 1.5 (*)    Lymphs Abs 0.2 (*)    All other components within normal limits  SARS CORONAVIRUS 2 BY RT PCR [facility] ORDER, [place] LAB)    EKG EKG Interpretation  Date/Time:  Monday May 28 2020 15:38:25 EDT Ventricular Rate:  87 PR Interval:    QRS Duration: 78 QT Interval:  382 QTC Calculation: 460 R Axis:   10 Text Interpretation: Sinus rhythm Baseline wander in lead(s) V3 No old tracing to compare Confirmed by Aletta Edouard [phone]) on 05/28/2020 3:41:07 PM   Radiology DG Chest Portable 1 View  Result Date: 05/28/2020 CLINICAL DATA:  Cough and shortness of breath. Reported COVID-19 positive EXAM: PORTABLE CHEST 1 VIEW COMPARISON:  May 03, 2018 FINDINGS: There is  ill-defined airspace opacity in each mid and lower lung region as well as in the right upper lobe. No consolidation. Heart size and pulmonary vascularity are normal. No adenopathy. No bone lesions. IMPRESSION: Scattered areas of ill-defined opacity bilaterally, likely due to multifocal atypical organism pneumonia. Cardiac silhouette normal. No adenopathy appreciable by radiography. Electronically Signed   By: Lowella Grip III M.D.   On: 05/28/2020 16:12    Procedures Procedures (including critical care time)  Medications Ordered in ED Medications  dexamethasone (DECADRON) injection 10 mg (has no administration in time range)  remdesivir 100 mg in sodium chloride 0.9 % 100 mL IVPB (has no administration in time range)    Followed by  remdesivir 100 mg in sodium chloride 0.9 % 100 mL IVPB (has no administration in time range)    ED Course  I have reviewed the triage vital signs and the nursing notes.  Pertinent labs & imaging results that were available during my care of the patient were reviewed by me and considered in my medical decision making (see chart for details).  Clinical Course as of May 28 1737  Mon May 28, 5388  [id number] 79 year old female with history of lymphoma sent over from urgent care after she tested positive for Covid in the setting of a new cough.  She is requiring oxygen.  Will need admission for further management.   [MB]    Clinical Course User Index [MB] Hayden Rasmussen, MD   MDM Rules/Calculators/A&P                      79 y.o. F who presents for evaluation of cough and SOB that has been ongoing for the last few weeks. Went to UC where she was requiring 2L of O2. She was tested for COVID which was positive. She was brought to the ED for further evaluation. Patient is afebrile, non-toxic appearing, sitting comfortably  on examination table. Vital signs reviewed.  She initially was satting at 95% on 2 L.  I took her off and she went down to 88%. Lungs CTAB with  no respiratory distress. Plan for labs, CXR.   CBC shows leukopenia of 2.2.  Hemoglobin is 11.2.  BMP shows sodium of 123.  BUN and creatinine within normal limits.    We took patient off of oxygen.  She dropped down to 88% which is moving.  We ambulated her and she went down to 87%.  Chest x-ray shows scattered areas of ill-defined opacity bilaterally.  Likely due to multifocal pneumonia.  Given patient's hypoxia, new oxygen requirement, feel that she needs admission for evaluation.  Discussed with Dr. Melina Copa who agrees with plan.  Discussed patient with Dr. Conni Slipper (hospitalist) who accepted patient for admission.   Portions of this note were generated with [employer]. Dictation errors may occur despite best attempts at proofreading.  Final Clinical Impression(s) / ED Diagnoses Final diagnoses:  COVID-19  Hypoxia    Rx / DC Orders ED Discharge Orders    None       Volanda Napoleon, PA-C 05/28/20 1739    Hayden Rasmussen, MD 05/29/20 [phone]

## 2020-05-28 NOTE — ED Notes (Addendum)
Date and time results received: 05/28/20 2023 (use smartphrase ".now" to insert current time)  Test:Covid  Critical Value:positive  Name of Provider Notified: Mauricio Arrien md  Orders Received? Or Actions Taken?: isolation.

## 2020-05-28 NOTE — Telephone Encounter (Signed)
Caller: Emmette (son) Call back phone number: [phone]  Kristina Ramos is calling to let you know he took his mom to the urgent care she is Covid positive.

## 2020-05-28 NOTE — ED Notes (Signed)
Ambulated pt in room and O2 dropped to 87%

## 2020-05-28 NOTE — H&P (Addendum)
History and Physical    Kristina Ramos NWG:[id number] DOB: Oct 21, 1941 DOA: 05/28/2020  PCP: Donato Schultz, DO   Patient coming from: Urgent Care  Chief Complaint: Dyspnea.   HPI: Kristina Ramos is a 79 y.o. female with medical history significant of follicular lymphoma, dyslipidemia, hypertension, hypothyroid, arthritis and depression.  Patient reported 2 weeks of dry cough, persistent, no associated fevers, chills, or malaise.  Over the last 4 days her symptoms have rapidly worsened, patient with worsening cough and experiencing dyspnea, moderate to severe in intensity, worse with exertion, no improving factors, associated with generalized weakness and mild confusion.   Due to her worsening symptoms patient was taken to an urgent care where she was found hypoxic, rapid testing for COVID-19 resulted positive.  She was referred to the emergency department for further evaluation.  Patient has been fully vaccinated for COVID-19.  She follows with Dr. 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  She had her 6 cycle of maintenance Gazyva on 04/03/20.   ED Course: Patient ill looking appearing, she had hypoxemia on ambulation down to 87% her white count is 2.2.  Her chest radiograph had bilateral multifocal disease or infiltrates.  Review of Systems:  1. General: No fevers, no chills,  2. ENT: No runny nose or sore throat, no hearing disturbances 3. Pulmonary: positive dyspnea, cough, as mentioned in HPI but not wheezing, or hemoptysis 4. Cardiovascular: No angina, claudication, lower extremity edema, pnd or orthopnea 5. Gastrointestinal: No nausea or vomiting, no diarrhea or constipation 6. Hematology: No easy bruisability or frequent infections 7. Urology: No dysuria, hematuria or increased urinary frequency 8. Dermatology: No rashes. 9. Neurology: No seizures or paresthesias 10. Musculoskeletal: No joint pain or  deformities  Past Medical History:  Diagnosis Date  . Anemia   . Arthritis   . Depression   . Follicular lymphoma grade ii, lymph nodes of multiple sites (HCC) 09/30/2018  . Follicular lymphoma grade ii, lymph nodes of multiple sites (HCC) 09/30/2018  . GERD (gastroesophageal reflux disease)   . Goals of care, counseling/discussion 09/30/2018  . Heart murmur   . Hx of adenomatous colonic polyps 07/28/2017  . Hyperlipidemia   . Hypertension   . 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 Thyroid disease     Past Surgical History:  Procedure Laterality Date  . TONSILLECTOMY    . TOTAL KNEE ARTHROPLASTY Left 04/25/2014  . TUBAL LIGATION       reports that she has never smoked. She has never used smokeless tobacco. She reports that she does not drink alcohol or use drugs.  Allergies  Allergen Reactions  . Aspirin Other (See Comments)    REACTION: pt states that it irritates her stomach, coated ASA is ok    Family History  Problem Relation Age of Onset  . Diabetes Maternal Grandmother   . Diabetes Paternal Aunt   . Lung cancer Father        smoker  . Heart disease Father   . CAD Mother   . Cataracts Mother   . Thyroid disease Maternal Aunt   . Colon cancer Neg Hx   . Esophageal cancer Neg Hx   . Pancreatic cancer Neg Hx   . Rectal cancer Neg Hx   . Stomach cancer Neg Hx      Prior to Admission medications   Medication Sig Start Date End Date Taking? Authorizing Provider  amoxicillin (AMOXIL) 500 MG capsule Take 2,000 mg  by mouth as needed. only for dental procedeure    [provider]  Cholecalciferol (VITAMIN D) 2000 units CAPS Take 1 capsule by mouth daily.    [provider]  famciclovir (FAMVIR) 250 MG tablet TAKE 1 TABLET BY MOUTH EVERY DAY 09/08/19   Josph Macho, MD  hydrochlorothiazide (HYDRODIURIL) 12.5 MG tablet TAKE 1 TABLET BY MOUTH EVERY DAY 01/11/20   Zola Button, Grayling Congress, DO  levothyroxine (SYNTHROID) 75 MCG tablet  Take 1 tablet (75 mcg total) by mouth daily. 01/25/20   Seabron Spates R, DO  lisinopril (ZESTRIL) 10 MG tablet TAKE 1 TABLET BY MOUTH EVERY DAY 01/11/20   Zola Button, Grayling Congress, DO  lisinopril-hydrochlorothiazide (ZESTORETIC) 10-12.5 MG tablet Take 1 tablet by mouth daily. 01/25/20   Donato Schultz, DO    Physical Exam: Vitals:   05/28/20 1610 05/28/20 1613 05/28/20 1621 05/28/20 1636  BP:    (!) 164/78  Pulse: 88 88 (!) 104 (!) 57  Resp: (!) 27 (!) 24  20  Temp:      TempSrc:      SpO2: 91% 91% (!) 88% 95%  Weight:      Height:        Vitals:   05/28/20 1610 05/28/20 1613 05/28/20 1621 05/28/20 1636  BP:    (!) 164/78  Pulse: 88 88 (!) 104 (!) 57  Resp: (!) 27 (!) 24  20  Temp:      TempSrc:      SpO2: 91% 91% (!) 88% 95%  Weight:      Height:       General: deconditioned  Neurology: Awake and alert, non focal Head and Neck. Head normocephalic. Neck supple with no adenopathy or thyromegaly.   E ENT: mild pallor, no icterus, oral mucosa dry.  Cardiovascular: No JVD. S1-S2 present, rhythmic, no gallops, rubs, or murmurs. No lower extremity edema. Pulmonary: positive breath sounds bilaterally, with no significant wheezing, rhonchi or rales. Gastrointestinal. Abdomen with  no organomegaly, non tender, no rebound or guarding Skin. No rashes Musculoskeletal: no joint deformities    Labs on Admission: I have personally reviewed following labs and imaging studies  CBC: Recent Labs  Lab 05/28/20 1526  WBC 2.2*  NEUTROABS 1.5*  HGB 11.2*  HCT 31.1*  MCV 81.4  PLT 240   Basic Metabolic Panel: Recent Labs  Lab 05/28/20 1526  NA 123*  K 3.7  CL 91*  CO2 19*  GLUCOSE 96  BUN 10  CREATININE 0.71  CALCIUM 8.0*   GFR: Estimated Creatinine Clearance: 66.5 mL/min (by C-G formula based on SCr of 0.71 mg/dL). Liver Function Tests: No results for input(s): AST, ALT, ALKPHOS, BILITOT, PROT, ALBUMIN in the last 168 hours. No results for input(s): LIPASE,  AMYLASE in the last 168 hours. No results for input(s): AMMONIA in the last 168 hours. Coagulation Profile: No results for input(s): INR, PROTIME in the last 168 hours. Cardiac Enzymes: No results for input(s): CKTOTAL, CKMB, CKMBINDEX, TROPONINI in the last 168 hours. BNP (last 3 results) No results for input(s): PROBNP in the last 8760 hours. HbA1C: No results for input(s): HGBA1C in the last 72 hours. CBG: No results for input(s): GLUCAP in the last 168 hours. Lipid Profile: No results for input(s): CHOL, HDL, LDLCALC, TRIG, CHOLHDL, LDLDIRECT in the last 72 hours. Thyroid Function Tests: No results for input(s): TSH, T4TOTAL, FREET4, T3FREE, THYROIDAB in the last 72 hours. Anemia Panel: No results for input(s): VITAMINB12, FOLATE, FERRITIN, TIBC, IRON,  RETICCTPCT in the last 72 hours. Urine analysis: No results found for: COLORURINE, APPEARANCEUR, LABSPEC, PHURINE, GLUCOSEU, HGBUR, BILIRUBINUR, KETONESUR, PROTEINUR, UROBILINOGEN, NITRITE, LEUKOCYTESUR  Radiological Exams on Admission: DG Chest Portable 1 View  Result Date: 05/28/2020 CLINICAL DATA:  Cough and shortness of breath. Reported COVID-19 positive EXAM: PORTABLE CHEST 1 VIEW COMPARISON:  May 03, 2018 FINDINGS: There is ill-defined airspace opacity in each mid and lower lung region as well as in the right upper lobe. No consolidation. Heart size and pulmonary vascularity are normal. No adenopathy. No bone lesions. IMPRESSION: Scattered areas of ill-defined opacity bilaterally, likely due to multifocal atypical organism pneumonia. Cardiac silhouette normal. No adenopathy appreciable by radiography. Electronically Signed   By: Bretta Bang III M.D.   On: 05/28/2020 16:12    EKG: Independently reviewed.  87 bpm, normal axis, normal intervals, sinus rhythm, no ST segment or T wave changes.  Assessment/Plan Principal Problem:   Pneumonia due to COVID-19 virus Active Problems:   Hypothyroidism   HYPERLIPIDEMIA   Essential  hypertension, benign   Obesity (BMI 30-39.9)   Iron deficiency anemia   Malignant lymphoma, lymphoplasmacytoid (HCC)   Follicular lymphoma grade ii, lymph nodes of multiple sites (HCC)   Acute respiratory failure with hypoxia (HCC)   Hyponatremia   79 year old female with a past medical history for chronic lymphoma, fully vaccinated for COVID-19 who presents with respiratory symptoms for about 2 weeks, worse for the last 4 days.  Symptoms consistent with cough and progressive dyspnea.  On her physical examination her blood pressure is 170/85, heart rate 87-90, respirate 22, oxygen saturation 93% on supplemental oxygen 2 L/min per nasal cannula, oxygenation room air 88%.  She has dry mucous membranes, her lungs had no wheezing or rails, heart S1-S2 present rhythmic, abdomen soft nontender, no lower extremity edema. Sodium 123, potassium 3.7, chloride 91, bicarb 19, glucose 96, BUN 10, creatinine 0.71, white count 2.2, hemoglobin 11.2, hematocrit 31.1, platelets 240.  Patient will be admitted to the hospital working diagnosis of acute hypoxic respiratory failure due to SARS COVID-19 viral pneumonia, complicated by hypovolemic hyponatremia and non-anion gap metabolic acidosis.  1.  Acute hypoxic respiratory failure due to SARS COVID-19 viral pneumonia. Patient will be admitted to the medical ward, placed on a remote telemetry monitoring.  Continue supplemental oxygen per nasal cannula, target oxygen saturation more than 92%.  Medical therapy with intravenous remdesivir and systemic corticosteroids with dexamethasone  6mg  Iv q 24H.  Bronchodilator therapy, antitussive agents, and airway clearing techniques with incentive spirometer and flutter valve.  Will follow-up on inflammatory markers and further oxygen requirements.  At this point patient clinically stable but high risk of deterioration.    Patient received obnituzumab on 04/03/20 anti CD20 monoclonal antibody.  If patient continues to  deteriorate to consider Tocilizumab administration prior consultation with oncology.  Continue multivitamins.  2.  Hypovolemic hyponatremia.   Patient will be placed on isotonic saline, 50 mL/h x 1 L, follow-up kidney function and electrolytes in the morning.  3.  Follicular lymphoma with leukopenia and anemia/ iron deficiency anemia.  Patient received obnituzumab on April 03, 2020, at Dr. Gustavo Lah office.  Continue close follow-up of cell count.  4.  Hypothyroidism.  Continue levothyroxine.  5.  Hypertension.  Will hold on lisinopril and hydrochlorothiazide for now, to prevent hypotension.  6 Obesity class 2. Calculated BMI 37. Will need close outpatient follow up. High risk for inpatient complications.   Status is: Inpatient  Remains inpatient appropriate because:IV treatments appropriate due to  intensity of illness or inability to take PO   Dispo: The patient is from: Home              Anticipated d/c is to: Home              Anticipated d/c date is: > 3 days              Patient currently is not medically stable to d/c.   DVT prophylaxis: Enoxaparin   Code Status:   full  Family Communication:  I spoke with patient's son at the bedside, we talked in detail about patient's condition, plan of care and prognosis and all questions were addressed.     Consults called:  None   Admission status:  Inpatient    Jahking Lesser Annett Gula MD [facility]   05/28/2020, 5:09 PM

## 2020-05-28 NOTE — ED Notes (Signed)
Patient's oxygen removed per [place], [place].  Will recheck in 10-15 mins and ambulate off oxygen at that time.

## 2020-05-29 ENCOUNTER — Telehealth: Payer: Medicare Other | Admitting: Family Medicine

## 2020-05-29 DIAGNOSIS — E876 Hypokalemia: Secondary | ICD-10-CM

## 2020-05-29 LAB — COMPREHENSIVE METABOLIC PANEL
ALT: 17 U/L (ref 0–44)
AST: 28 U/L (ref 15–41)
Albumin: 3.1 g/dL — ABNORMAL LOW (ref 3.5–5.0)
Alkaline Phosphatase: 50 U/L (ref 38–126)
Anion gap: 16 — ABNORMAL HIGH (ref 5–15)
BUN: 12 mg/dL (ref 8–23)
CO2: 20 mmol/L — ABNORMAL LOW (ref 22–32)
Calcium: 8.6 mg/dL — ABNORMAL LOW (ref 8.9–10.3)
Chloride: 94 mmol/L — ABNORMAL LOW (ref 98–111)
Creatinine, Ser: 0.53 mg/dL (ref 0.44–1.00)
GFR calc Af Amer: >60 mL/min (ref >60)
GFR calc non Af Amer: >60 mL/min (ref >60)
Glucose, Bld: 144 mg/dL — ABNORMAL HIGH (ref 70–99)
Potassium: 2.9 mmol/L — ABNORMAL LOW (ref 3.5–5.1)
Sodium: 130 mmol/L — ABNORMAL LOW (ref 135–145)
Total Bilirubin: 0.9 mg/dL (ref 0.3–1.2)
Total Protein: 6.2 g/dL — ABNORMAL LOW (ref 6.5–8.1)

## 2020-05-29 LAB — C-REACTIVE PROTEIN: CRP: 11.4 mg/dL — ABNORMAL HIGH (ref <1.0)

## 2020-05-29 LAB — FERRITIN: Ferritin: 1444 ng/mL — ABNORMAL HIGH (ref 11–307)

## 2020-05-29 LAB — D-DIMER, QUANTITATIVE: D-Dimer, Quant: 3.13 ug/mL-FEU — ABNORMAL HIGH (ref 0.00–0.50)

## 2020-05-29 LAB — ABO/RH: ABO/RH(D): A POS

## 2020-05-29 MED ORDER — DEXAMETHASONE SODIUM PHOSPHATE 10 MG/ML IJ SOLN
6.0000 mg | Freq: Three times a day (TID) | INTRAMUSCULAR | Status: DC
  Administered 2020-05-29 – 2020-05-30 (×3): 6 mg via INTRAVENOUS
  Filled 2020-05-29 (×2): qty 1

## 2020-05-29 MED ORDER — SODIUM CHLORIDE 0.9 % IV SOLN
1.0000 g | INTRAVENOUS | Status: DC
  Administered 2020-05-29: 1 g via INTRAVENOUS
  Filled 2020-05-29: qty 1
  Filled 2020-05-29: qty 10

## 2020-05-29 MED ORDER — POTASSIUM CHLORIDE CRYS ER 20 MEQ PO TBCR
40.0000 meq | EXTENDED_RELEASE_TABLET | ORAL | Status: AC
  Administered 2020-05-29 (×2): 40 meq via ORAL
  Filled 2020-05-29 (×2): qty 2

## 2020-05-29 MED ORDER — POTASSIUM CHLORIDE CRYS ER 20 MEQ PO TBCR
40.0000 meq | EXTENDED_RELEASE_TABLET | Freq: Once | ORAL | Status: AC
  Administered 2020-05-29: 40 meq via ORAL
  Filled 2020-05-29: qty 2

## 2020-05-29 MED ORDER — SODIUM CHLORIDE 0.9 % IV SOLN
500.0000 mg | INTRAVENOUS | Status: DC
  Administered 2020-05-29: 500 mg via INTRAVENOUS
  Filled 2020-05-29 (×2): qty 500

## 2020-05-29 NOTE — Progress Notes (Signed)
PROGRESS NOTE    Kristina Ramos  EQA:[id number] DOB: 01/13/1941 DOA: 05/28/2020 PCP: Ann Held, DO    Chief Complaint  Patient presents with   Cough    Brief Narrative:  Kristina Ramos is a 79 y.o. female with medical history significant of follicular lymphoma, dyslipidemia, hypertension, hypothyroid, arthritis and depression. Patient reported 2 weeks of dry cough, persistent, no associated fevers, chills, or malaise.  Over the last 4 days her symptoms have rapidly worsened, patient with worsening cough and experiencing dyspnea, moderate to severe in intensity, worse with exertion, no improving factors, associated with generalized weakness and mild confusion.   Due to her worsening symptoms patient was taken to an urgent care where she was found hypoxic, rapid testing for COVID-19 resulted positive.  She was referred to the emergency department for further evaluation.  Patient has been fully vaccinated for COVID-19.  She follows with Dr. 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  She had her 6 cycle of maintenance Gazyva on 04/03/20.   ED Course: She had hypoxemia on ambulation down to 87% her white count is 2.2.  Her chest radiograph had bilateral multifocal disease or infiltrates.   Assessment & Plan:   Principal Problem:   Pneumonia due to COVID-19 virus Active Problems:   Hypothyroidism   HYPERLIPIDEMIA   Essential hypertension, benign   Obesity (BMI 30-39.9)   Iron deficiency anemia   Malignant lymphoma, lymphoplasmacytoid (HCC)   Follicular lymphoma grade ii, lymph nodes of multiple sites ([place])   Acute respiratory failure with hypoxia (HCC)   Hyponatremia   Acute hypoxic respiratory failure due to SARS COVID-19 viral pneumonia. Continues to be hypoxemic.  Continue supplemental oxygen per nasal cannula, target oxygen saturation more than 92%.  Continue medical therapy with  intravenous remdesivir and systemic corticosteroids with dexamethasone 6mg  IV Q8 hrs with taper. Continue bronchodilator therapy, antitussive agents, and airway clearing techniques with incentive spirometer and flutter valve.  Follow-up on inflammatory markers and further oxygen requirements.  At this point patient clinically stable but high risk of deterioration.    Patient received obnituzumab on 04/03/20 anti CD20 monoclonal antibody.  If patient continues to deteriorate to consider Tocilizumab administration with prior consultation with oncology.  Given that she is immunosuppressed secondary to her cancer therapy also started her on empiric antibiotics for secondary bacterial pneumonia.  Continue multivitamins.  Hypovolemic hyponatremia.   Patient will be placed on isotonic saline, 50 mL/h x 1 L, follow-up kidney function and electrolytes in the morning.  Hypokalemia.  Potassium replacement ordered.  We will also order magnesium level for a.m.  Continue to monitor and replace as needed.  Follicular lymphoma with leukopenia and anemia/ iron deficiency anemia.  Patient received obnituzumab on April 03, 2020, at Dr. Antonieta Pert office.  Continue close follow-up of cell count.  Hypothyroidism.  Continue levothyroxine.  TSH checked 02/07/2020 was normal.  Hypertension. Holding lisinopril and hydrochlorothiazide for now, to prevent hypotension.  Continue to monitor blood pressure closely and adjust medications as needed.  Obesity class 2. Calculated BMI 37. Will need close outpatient follow up. High risk for inpatient complications.     Status is: Inpatient  Remains inpatient appropriate because:IV treatments appropriate due to intensity of illness  Dispo: The patient is from: Home  Anticipated d/c is to: Home  Anticipated d/c date is: > 3 days  Patient currently is not medically stable to d/c.   DVT prophylaxis:  Enoxaparin   Code  Status:              full  Family Communication:  No family at bedside, unable to reach.    Consultants:   None   Procedures:   None   Antimicrobials:    Anti-infectives (From admission, onward)   Start     Dose/Rate Route Frequency Ordered Stop   05/29/20 1000  remdesivir 100 mg in sodium chloride 0.9 % 100 mL IVPB     100 mg 200 mL/hr over 30 Minutes Intravenous Daily 05/28/20 1715 06/02/20 0959   05/29/20 1000  azithromycin (ZITHROMAX) 500 mg in sodium chloride 0.9 % 250 mL IVPB     500 mg 250 mL/hr over 60 Minutes Intravenous Every 24 hours 05/29/20 0914     05/29/20 1000  cefTRIAXone (ROCEPHIN) 1 g in sodium chloride 0.9 % 100 mL IVPB     1 g 200 mL/hr over 30 Minutes Intravenous Every 24 hours 05/29/20 0914     05/28/20 1715  remdesivir 100 mg in sodium chloride 0.9 % 100 mL IVPB     100 mg 200 mL/hr over 30 Minutes Intravenous Every 1 hr x 2 05/28/20 1715 05/28/20 2250        Subjective: She is complaining of shortness of breath with cough.  Denies chest pain at this time.  Objective: Vitals:   05/29/20 0342 05/29/20 0842 05/29/20 0900 05/29/20 1314  BP: 138/76 138/70  137/72  Pulse: 88 80  83  Resp: 20 (!) 22  (!) 22  Temp: 97.7 F (36.5 C) 97.6 F (36.4 C)  97.6 F (36.4 C)  TempSrc:  Oral    SpO2: 90% (!) 88% 96% 95%  Weight:      Height:        Intake/Output Summary (Last 24 hours) at 05/29/2020 1734 Last data filed at 05/29/2020 1400 Gross per 24 hour  Intake 1212.84 ml  Output --  Net 1212.84 ml   Filed Weights   05/28/20 1526 05/28/20 2320  Weight: 99.8 kg 100.8 kg    Examination:  General exam: Ill-appearing female, awake Respiratory system: Scattered rhonchi, no wheezing Cardiovascular system: S1 & S2, no murmur. Gastrointestinal system: Abdomen is nondistended, soft and nontender. No masses felt. Normal bowel sounds heard. Central nervous system: Alert and oriented. No focal neurological deficits. Extremities: Symmetric 5 x 5  power, no lower extremity edema. Skin: No rashes Psychiatry: Judgement and insight appear normal. Mood & affect appropriate.     Data Reviewed: I have personally reviewed following labs and imaging studies  CBC: Recent Labs  Lab 05/28/20 1526  WBC 2.2*  NEUTROABS 1.5*  HGB 11.2*  HCT 31.1*  MCV 81.4  PLT 009    Basic Metabolic Panel: Recent Labs  Lab 05/28/20 1526 05/29/20 0536  NA 123* 130*  K 3.7 2.9*  CL 91* 94*  CO2 19* 20*  GLUCOSE 96 144*  BUN 10 12  CREATININE 0.71 0.53  CALCIUM 8.0* 8.6*    GFR: Estimated Creatinine Clearance: 66.9 mL/min (by C-G formula based on SCr of 0.53 mg/dL).  Liver Function Tests: Recent Labs  Lab 05/29/20 0536  AST 28  ALT 17  ALKPHOS 50  BILITOT 0.9  PROT 6.2*  ALBUMIN 3.1*    CBG: No results for input(s): GLUCAP in the last 168 hours.   Recent Results (from the past 240 hour(s))  SARS Coronavirus 2 by RT PCR (hospital order, performed in [facility] hospital lab) Nasopharyngeal Nasopharyngeal Swab  Status: Abnormal   Collection Time: 05/28/20  5:26 PM   Specimen: Nasopharyngeal Swab  Result Value Ref Range Status   SARS Coronavirus 2 POSITIVE (A) NEGATIVE Final    Comment: RESULT CALLED TO, READ BACK BY AND VERIFIED WITH: NASH,J. RN @2024  05/28/20 BILLINGSLEY,L (NOTE) SARS-CoV-2 target nucleic acids are DETECTED SARS-CoV-2 RNA is generally detectable in upper respiratory specimens  during the acute phase of infection.  Positive results are indicative  of the presence of the identified virus, but do not rule out bacterial infection or co-infection with other pathogens not detected by the test.  Clinical correlation with patient history and  other diagnostic information is necessary to determine patient infection status.  The expected result is negative. Fact Sheet for Patients:   StrictlyIdeas.no  Fact Sheet for Healthcare Providers:   BankingDealers.co.za    This test is not yet approved or cleared by the [place] FDA and  has been authorized for detection and/or diagnosis of SARS-CoV-2 by FDA under an Emergency Use Authorization (EUA).  This EUA will remain in effect (meaning this test ca n be used) for the duration of  the COVID-19 declaration under Section 564(b)(1) of the Act, 21 U.S.C. section 360-bbb-3(b)(1), unless the authorization is terminated or revoked sooner. Performed at [facility], [place] [address] [place], [place] [address]          Radiology Studies: DG Chest Portable 1 View  Result Date: 05/28/2020 CLINICAL DATA:  Cough and shortness of breath. Reported COVID-19 positive EXAM: PORTABLE CHEST 1 VIEW COMPARISON:  May 03, 2018 FINDINGS: There is ill-defined airspace opacity in each mid and lower lung region as well as in the right upper lobe. No consolidation. Heart size and pulmonary vascularity are normal. No adenopathy. No bone lesions. IMPRESSION: Scattered areas of ill-defined opacity bilaterally, likely due to multifocal atypical organism pneumonia. Cardiac silhouette normal. No adenopathy appreciable by radiography. Electronically Signed   By: Lowella Grip III M.D.   On: 05/28/2020 16:12        Scheduled Meds:  vitamin C  500 mg Oral Daily   chlorpheniramine-HYDROcodone  5 mL Oral Q12H   dexamethasone (DECADRON) injection  6 mg Intravenous Q8H   enoxaparin (LOVENOX) injection  50 mg Subcutaneous N47S   folic acid  1 mg Oral Daily   Ipratropium-Albuterol  1 puff Inhalation QID   levothyroxine  75 mcg Oral Daily   multivitamin with minerals  1 tablet Oral Daily   zinc sulfate  220 mg Oral Daily   Continuous Infusions:  sodium chloride 50 mL/hr at 05/29/20 0228   azithromycin 500 mg (05/29/20 1135)   cefTRIAXone (ROCEPHIN)  IV 1 g (05/29/20 1057)   remdesivir 100 mg in NS 100 mL 100 mg (05/29/20 1019)     LOS: 1 day     Yaakov Guthrie, MD [facility]   To contact the attending provider between 7A-7P or the covering provider during after hours 7P-7A, please log into the web site [email or web address] and access using universal [place] password for that web site. If you do not have the password, please call the hospital operator.  05/29/2020, 5:34 PM

## 2020-05-29 NOTE — Plan of Care (Signed)
Initiated Care Plan

## 2020-05-30 ENCOUNTER — Telehealth: Payer: Self-pay | Admitting: *Deleted

## 2020-05-30 DIAGNOSIS — U071 COVID-19: Principal | ICD-10-CM

## 2020-05-30 DIAGNOSIS — C8218 Follicular lymphoma grade II, lymph nodes of multiple sites: Secondary | ICD-10-CM

## 2020-05-30 DIAGNOSIS — E871 Hypo-osmolality and hyponatremia: Secondary | ICD-10-CM

## 2020-05-30 DIAGNOSIS — J1282 Pneumonia due to coronavirus disease 2019: Secondary | ICD-10-CM

## 2020-05-30 DIAGNOSIS — I10 Essential (primary) hypertension: Secondary | ICD-10-CM

## 2020-05-30 DIAGNOSIS — J9601 Acute respiratory failure with hypoxia: Secondary | ICD-10-CM

## 2020-05-30 LAB — COMPREHENSIVE METABOLIC PANEL
ALT: 16 U/L (ref 0–44)
AST: 21 U/L (ref 15–41)
Albumin: 3.1 g/dL — ABNORMAL LOW (ref 3.5–5.0)
Alkaline Phosphatase: 48 U/L (ref 38–126)
Anion gap: 12 (ref 5–15)
BUN: 15 mg/dL (ref 8–23)
CO2: 21 mmol/L — ABNORMAL LOW (ref 22–32)
Calcium: 8.4 mg/dL — ABNORMAL LOW (ref 8.9–10.3)
Chloride: 99 mmol/L (ref 98–111)
Creatinine, Ser: 0.59 mg/dL (ref 0.44–1.00)
GFR calc Af Amer: >60 mL/min (ref >60)
GFR calc non Af Amer: >60 mL/min (ref >60)
Glucose, Bld: 177 mg/dL — ABNORMAL HIGH (ref 70–99)
Potassium: 4.1 mmol/L (ref 3.5–5.1)
Sodium: 132 mmol/L — ABNORMAL LOW (ref 135–145)
Total Bilirubin: 0.5 mg/dL (ref 0.3–1.2)
Total Protein: 5.7 g/dL — ABNORMAL LOW (ref 6.5–8.1)

## 2020-05-30 LAB — D-DIMER, QUANTITATIVE: D-Dimer, Quant: 2.61 ug/mL-FEU — ABNORMAL HIGH (ref 0.00–0.50)

## 2020-05-30 LAB — FERRITIN: Ferritin: 1435 ng/mL — ABNORMAL HIGH (ref 11–307)

## 2020-05-30 LAB — MAGNESIUM: Magnesium: 1.8 mg/dL (ref 1.7–2.4)

## 2020-05-30 LAB — C-REACTIVE PROTEIN: CRP: 6.5 mg/dL — ABNORMAL HIGH (ref <1.0)

## 2020-05-30 MED ORDER — DEXAMETHASONE SODIUM PHOSPHATE 10 MG/ML IJ SOLN
6.0000 mg | INTRAMUSCULAR | Status: DC
  Administered 2020-05-31 – 2020-06-01 (×2): 6 mg via INTRAVENOUS
  Filled 2020-05-30 (×2): qty 1

## 2020-05-30 NOTE — Progress Notes (Signed)
PROGRESS NOTE  Kristina Ramos  IRJ:[id number] DOB: 17-Jan-1941 DOA: 05/28/2020 PCP: Ann Held, DO  Brief Narrative: Kristina Ashworth Coxis a 79 y.o.femalewith medical history significant offollicular lymphoma, dyslipidemia, hypertension, hypothyroid, arthritis and depression who has been fully vaccinated for SARS-CoV-2 and presented initially to urgent care with 2 weeks of dry cough and 4 days of worsening with dyspnea, moderate to severe in intensity. Due to her worsening symptoms patient was taken to an urgent care where she was found hypoxic, rapid testing for COVID-19 resulted positive. She was referred to the emergency department for further evaluation. She had hypoxemia on ambulation down to 87% her white count is 2.2.Her chest radiograph had bilateral multifocal disease or infiltrates. Remdesivir and decadron started.  Assessment & Plan: Principal Problem:   Pneumonia due to COVID-19 virus Active Problems:   Hypothyroidism   HYPERLIPIDEMIA   Essential hypertension, benign   Obesity (BMI 30-39.9)   Iron deficiency anemia   Malignant lymphoma, lymphoplasmacytoid (HCC)   Follicular lymphoma grade ii, lymph nodes of multiple sites [facility])   Acute respiratory failure with hypoxia (HCC)   Hyponatremia  Acute hypoxemic respiratory failure due to covid-19 pneumonia: SARS-CoV-2 PCR positive on 6/7.  - Continues remdesivir x5 days 6/7 - 6/11 - Steroids x10 days. Can deescalate to typical decadron dosing - Fortunately she is not worsening as I don't believe off-label use of tocilizumab would be prudent in this immunosuppressed oncology patient.  - Vitamin C, zinc - Encourage OOB, IS, FV, and awake proning if able - Tylenol and antitussives prn - Continue airborne, contact precautions while admitted. Isolation period would be recommended for 21 days from positive testing. - Check CBC w/diff, CMP, CRP daily - Enoxaparin prophylactic dose.  - Maintain euvolemia/net negative.  - Avoid  NSAIDs   Follicular lymphoma with leukopenia, iron deficiency anemia: She follows with Dr. 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 She had her 6 cycle of maintenance Gazyva on 04/03/20.  Obesity: Body mass index is 38.14 kg/m.   HTN:  - Avoiding hypotension with lisinopril/HCTZ currently  Hyponatremia:  - Holding diuretic/ACE inhibitor as above. Stop IVF due to covid and ability to take po.   Hypokalemia:  - Supplement as needed  Hypothyroidism: Last TSH 2.50 - Continue synthroid  DVT prophylaxis: Lovenox Code Status: Full Family Communication: None at bedside Disposition Plan:  Status is: Inpatient  Remains inpatient appropriate because:Inpatient level of care appropriate due to severity of illness. Remains hypoxemic newly undergoing treatment for covid-19 pneumonia with high risk of clinical deterioration.  Dispo: The patient is from: Home              Anticipated d/c is to: TBD, PT/OT ordered              Anticipated d/c date is: 2 days              Patient currently is not medically stable to d/c.  Consultants:   None  Procedures:   None  Antimicrobials:  Remdesivir   Subjective: Still moderately short of breath with any exertion, improved with supplemental oxygen. Cough is nonproductive persistent and stable. No chest pain or fever.   Objective: Vitals:   05/29/20 1314 05/29/20 2115 05/30/20 0635 05/30/20 1433  BP: 137/72 136/70 139/76 (!) 149/81  Pulse: 83 79 72 85  Resp: (!) 22 20 20 20   Temp: 97.6 F (36.4 C) 97.6 F (36.4 C) (!) 97.5 F (36.4 C) 97.8 F (  36.6 C)  TempSrc:    Oral  SpO2: 95% 91% 95% 90%  Weight:      Height:        Intake/Output Summary (Last 24 hours) at 05/30/2020 2028 Last data filed at 05/30/2020 0000 Gross per 24 hour  Intake 271.95 ml  Output --  Net 271.95 ml   Filed Weights   05/28/20 1526 05/28/20 2320  Weight: 99.8 kg 100.8 kg    Gen:  79 y.o. female in no distress Pulm: Non-labored but tachypneic with supplemental oxygen at rest, crackles bilaterally without wheezes. CV: Regular rate and rhythm. No murmur, rub, or gallop. No JVD, no pedal edema. GI: Abdomen soft, non-tender, non-distended, with normoactive bowel sounds. No organomegaly or masses felt. Ext: Warm, no deformities Skin: No rashes, lesions or ulcers Neuro: Alert and oriented. No focal neurological deficits. Psych: Judgement and insight appear normal. Mood & affect appropriate.   Data Reviewed: I have personally reviewed following labs and imaging studies  CBC: Recent Labs  Lab 05/28/20 1526  WBC 2.2*  NEUTROABS 1.5*  HGB 11.2*  HCT 31.1*  MCV 81.4  PLT 324   Basic Metabolic Panel: Recent Labs  Lab 05/28/20 1526 05/29/20 0536 05/30/20 0456  NA 123* 130* 132*  K 3.7 2.9* 4.1  CL 91* 94* 99  CO2 19* 20* 21*  GLUCOSE 96 144* 177*  BUN 10 12 15   CREATININE 0.71 0.53 0.59  CALCIUM 8.0* 8.6* 8.4*  MG  --   --  1.8   GFR: Estimated Creatinine Clearance: 66.9 mL/min (by C-G formula based on SCr of 0.59 mg/dL). Liver Function Tests: Recent Labs  Lab 05/29/20 0536 05/30/20 0456  AST 28 21  ALT 17 16  ALKPHOS 50 48  BILITOT 0.9 0.5  PROT 6.2* 5.7*  ALBUMIN 3.1* 3.1*   No results for input(s): LIPASE, AMYLASE in the last 168 hours. No results for input(s): AMMONIA in the last 168 hours. Coagulation Profile: No results for input(s): INR, PROTIME in the last 168 hours. Cardiac Enzymes: No results for input(s): CKTOTAL, CKMB, CKMBINDEX, TROPONINI in the last 168 hours. BNP (last 3 results) No results for input(s): PROBNP in the last 8760 hours. HbA1C: No results for input(s): HGBA1C in the last 72 hours. CBG: No results for input(s): GLUCAP in the last 168 hours. Lipid Profile: No results for input(s): CHOL, HDL, LDLCALC, TRIG, CHOLHDL, LDLDIRECT in the last 72 hours. Thyroid Function Tests: No results for input(s): TSH, T4TOTAL,  FREET4, T3FREE, THYROIDAB in the last 72 hours. Anemia Panel: Recent Labs    05/29/20 0536 05/30/20 0456  FERRITIN 1,444* 1,435*   Urine analysis: No results found for: COLORURINE, APPEARANCEUR, LABSPEC, PHURINE, GLUCOSEU, HGBUR, BILIRUBINUR, KETONESUR, PROTEINUR, UROBILINOGEN, NITRITE, LEUKOCYTESUR Recent Results (from the past 240 hour(s))  SARS Coronavirus 2 by RT PCR (hospital order, performed in [facility] hospital lab) Nasopharyngeal Nasopharyngeal Swab     Status: Abnormal   Collection Time: 05/28/20  5:26 PM   Specimen: Nasopharyngeal Swab  Result Value Ref Range Status   SARS Coronavirus 2 POSITIVE (A) NEGATIVE Final    Comment: RESULT CALLED TO, READ BACK BY AND VERIFIED WITH: NASH,J. RN @2024  05/28/20 BILLINGSLEY,L (NOTE) SARS-CoV-2 target nucleic acids are DETECTED SARS-CoV-2 RNA is generally detectable in upper respiratory specimens  during the acute phase of infection.  Positive results are indicative  of the presence of the identified virus, but do not rule out bacterial infection or co-infection with other pathogens not detected by the test.  Clinical correlation  with patient history and  other diagnostic information is necessary to determine patient infection status.  The expected result is negative. Fact Sheet for Patients:   StrictlyIdeas.no  Fact Sheet for Healthcare Providers:   BankingDealers.co.za   This test is not yet approved or cleared by the [place] FDA and  has been authorized for detection and/or diagnosis of SARS-CoV-2 by FDA under an Emergency Use Authorization (EUA).  This EUA will remain in effect (meaning this test ca n be used) for the duration of  the COVID-19 declaration under Section 564(b)(1) of the Act, 21 U.S.C. section 360-bbb-3(b)(1), unless the authorization is terminated or revoked sooner. Performed at [facility], [place] [address], [place], [place] [address]        Radiology Studies: No results found.  Scheduled Meds: . vitamin C  500 mg Oral Daily  . chlorpheniramine-HYDROcodone  5 mL Oral Q12H  . [START ON 05/31/2020] dexamethasone (DECADRON) injection  6 mg Intravenous Q24H  . enoxaparin (LOVENOX) injection  50 mg Subcutaneous Q24H  . folic acid  1 mg Oral Daily  . Ipratropium-Albuterol  1 puff Inhalation QID  . levothyroxine  75 mcg Oral Daily  . multivitamin with minerals  1 tablet Oral Daily  . zinc sulfate  220 mg Oral Daily   Continuous Infusions: . remdesivir 100 mg in NS 100 mL 100 mg (05/30/20 0831)     LOS: 2 days   Time spent: 25 minutes.  Patrecia Pour, MD [facility] [email or web address] 05/30/2020, 8:28 PM

## 2020-05-30 NOTE — Telephone Encounter (Signed)
Call received from patient's daughter, Katy Apo to inform Dr. Marin Olp that pt has been admitted to room 1436 at [facility] with Covid-19.  Dr. Marin Olp notified.

## 2020-05-31 LAB — D-DIMER, QUANTITATIVE: D-Dimer, Quant: 2.39 ug/mL-FEU — ABNORMAL HIGH (ref 0.00–0.50)

## 2020-05-31 LAB — COMPREHENSIVE METABOLIC PANEL
ALT: 16 U/L (ref 0–44)
AST: 25 U/L (ref 15–41)
Albumin: 3.1 g/dL — ABNORMAL LOW (ref 3.5–5.0)
Alkaline Phosphatase: 47 U/L (ref 38–126)
Anion gap: 13 (ref 5–15)
BUN: 16 mg/dL (ref 8–23)
CO2: 19 mmol/L — ABNORMAL LOW (ref 22–32)
Calcium: 8.7 mg/dL — ABNORMAL LOW (ref 8.9–10.3)
Chloride: 103 mmol/L (ref 98–111)
Creatinine, Ser: 0.66 mg/dL (ref 0.44–1.00)
GFR calc Af Amer: >60 mL/min (ref >60)
GFR calc non Af Amer: >60 mL/min (ref >60)
Glucose, Bld: 150 mg/dL — ABNORMAL HIGH (ref 70–99)
Potassium: 3.6 mmol/L (ref 3.5–5.1)
Sodium: 135 mmol/L (ref 135–145)
Total Bilirubin: 0.7 mg/dL (ref 0.3–1.2)
Total Protein: 5.8 g/dL — ABNORMAL LOW (ref 6.5–8.1)

## 2020-05-31 LAB — C-REACTIVE PROTEIN: CRP: 3.3 mg/dL — ABNORMAL HIGH (ref <1.0)

## 2020-05-31 MED ORDER — HYDROCHLOROTHIAZIDE 25 MG PO TABS
12.5000 mg | ORAL_TABLET | Freq: Every day | ORAL | Status: DC
  Administered 2020-05-31 – 2020-06-01 (×2): 12.5 mg via ORAL
  Filled 2020-05-31 (×2): qty 1

## 2020-05-31 MED ORDER — LISINOPRIL 10 MG PO TABS
10.0000 mg | ORAL_TABLET | Freq: Every day | ORAL | Status: DC
  Administered 2020-05-31 – 2020-06-01 (×2): 10 mg via ORAL
  Filled 2020-05-31 (×2): qty 1

## 2020-05-31 MED ORDER — LOPERAMIDE HCL 2 MG PO CAPS
2.0000 mg | ORAL_CAPSULE | ORAL | Status: DC | PRN
  Administered 2020-05-31 – 2020-06-01 (×4): 2 mg via ORAL
  Filled 2020-05-31 (×4): qty 1

## 2020-05-31 NOTE — Care Management Important Message (Signed)
Important Message  Patient Details IM Letter given to Gabriel Earing RN Case Manager to present to the Patient Name: Kristina Ramos MRN: [id number] Date of Birth: 10-31-1941   Medicare Important Message Given:  Yes     Kerin Salen 05/31/2020, 12:48 PM

## 2020-05-31 NOTE — TOC Initial Note (Signed)
Transition of Care [facility]) - Initial/Assessment Note    Patient Details  Name: Kristina Ramos MRN: [id number] Date of Birth: February 09, 1941  [facility]) CM/SW Contact:    Purcell Mouton, RN Phone Number: 05/31/2020, 9:36 AM  Clinical Narrative:                 TOC will continue to follow for discharge needs.   Expected Discharge Plan: Home/Self Care Barriers to Discharge: No Barriers Identified   Patient Goals and CMS Choice Patient states their goals for this hospitalization and ongoing recovery are:: To get better, go home      Expected Discharge Plan and Services Expected Discharge Plan: Home/Self Care   Discharge Planning Services: CM Consult   Living arrangements for the past 2 months: Single Family Home                                      Prior Living Arrangements/Services Living arrangements for the past 2 months: Single Family Home Lives with:: Self Patient language and need for interpreter reviewed:: No Do you feel safe going back to the place where you live?: Yes        Care giver support system in place?: Yes (comment) (Family)      Activities of Daily Living Home Assistive Devices/Equipment: Eyeglasses ADL Screening (condition at time of admission) Patient's cognitive ability adequate to safely complete daily activities?: Yes Is the patient deaf or have difficulty hearing?: No Does the patient have difficulty seeing, even when wearing glasses/contacts?: No Does the patient have difficulty concentrating, remembering, or making decisions?: No Patient able to express need for assistance with ADLs?: Yes Does the patient have difficulty dressing or bathing?: No Independently performs ADLs?: Yes (appropriate for developmental age) Does the patient have difficulty walking or climbing stairs?: Yes Weakness of Legs: None Weakness of Arms/Hands: None  Permission Sought/Granted Permission sought to share information with : Case Manager                 Emotional Assessment Appearance:: Appears stated age     Orientation: : Oriented to Self, Oriented to Place, Oriented to  Time, Oriented to Situation      Admission diagnosis:  Hypoxia [R09.02] Pneumonia due to COVID-19 virus [U07.1, J12.82] COVID-19 [U07.1] Patient Active Problem List   Diagnosis Date Noted  . Pneumonia due to COVID-19 virus 05/28/2020  . Acute respiratory failure with hypoxia ([place]) 05/28/2020  . Hyponatremia 05/28/2020  . Low vitamin B12 level 10/05/2018  . Malignant lymphoma, lymphoplasmacytoid ([place]) 09/30/2018  . Goals of care, counseling/discussion 09/30/2018  . Follicular lymphoma grade ii, lymph nodes of multiple sites ([place]) 09/30/2018  . Iron deficiency anemia 03/18/2018  . Iron deficiency anemia due to chronic blood loss 02/12/2018  . Hx of adenomatous colonic polyps 07/28/2017  . Fungal dermatitis 05/09/2015  . Rib pain on left side 01/02/2015  . Obesity (BMI 30-39.9) 12/27/2013  . OA (osteoarthritis) of knee 12/27/2013  . 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 HEADACHE 01/21/2010  . LACERATION OF FINGER 03/19/2009  . ARTHROSCOPY, LEFT KNEE, HX OF 03/23/2008  . HYPERLIPIDEMIA 12/14/2007  . DYSMETABOLIC SYNDROME X 25/85/2778  . Hypothyroidism 11/26/2007  . DEPRESSION 11/26/2007  . Essential hypertension, benign 11/26/2007  . TONSILLECTOMY AND ADENOIDECTOMY, HX OF 11/26/2007   PCP:  Ann Held, DO Pharmacy:   [id number] - [place], [address]  [place] [address] Phone: [phone] Fax: [phone]     Social Determinants of Health (SDOH) Interventions    Readmission Risk Interventions No flowsheet data found.

## 2020-05-31 NOTE — Evaluation (Signed)
Occupational Therapy Evaluation Patient Details Name: Kristina Ramos MRN: [id number] DOB: 03-05-1941 Today's Date: 05/31/2020    History of Present Illness 79 y.o. female with medical history significant of follicular lymphoma, dyslipidemia, hypertension, hypothyroid, arthritis and depression. Patient reported 2 weeks of dry cough, persistent, no associated fevers, chills, or malaise.  Over the last 4 days her symptoms have rapidly worsened, patient with worsening cough and experiencing dyspnea, moderate to severe in intensity, worse with exertion, no improving factors, associated with generalized weakness and mild confusion.  Due to her worsening symptoms patient was taken to an urgent care where she was found hypoxic, rapid testing for COVID-19 resulted positive.  She was referred to the emergency department for further evaluation.   Clinical Impression   Kristina Ramos is a 79 year old woman normally independent admitted to hospital with COVID. ON evaluation patient able to perform ADLs from seated position with set up and requiring hand held assistance with ambulation secondary to generalized weakness and decreased activity tolerance. Patient's o2 sat dropped to 86% on room air but recovered back into 90s without oxygen. Patient will benefit from skilled OT services while in hospital to improve deficits, learn compensatory strategies and education as needed in order to return home at independence. Unlikely to need OT services at discharge.    Follow Up Recommendations  No OT follow up    Equipment Recommendations  None recommended by OT    Recommendations for Other Services       Precautions / Restrictions Precautions Precautions: Fall Restrictions Weight Bearing Restrictions: No      Mobility Bed Mobility               General bed mobility comments: in recliner upon arrival  Transfers Overall transfer level: Needs assistance Equipment used: None Transfers: Sit to/from  Stand Sit to Stand: Min guard         General transfer comment: BUE assisting to power up, slightly unsteady upon rising but improves with time, once on feet reaches for therapist's hand for security. min guard for ambulton. Patient fearful and reporting weakness.    Balance Overall balance assessment: Mild deficits observed, not formally tested         Standing balance support: During functional activity;Single extremity supported Standing balance-Leahy Scale: Fair Standing balance comment: reliant on HHA                           ADL either performed or assessed with clinical judgement   ADL Overall ADL's : Needs assistance/impaired Eating/Feeding: Independent   Grooming: Set up;Sitting   Upper Body Bathing: Set up;Sitting   Lower Body Bathing: Set up;Sit to/from stand Lower Body Bathing Details (indicate cue type and reason): predominantly in seated position due to fatigue Upper Body Dressing : Set up;Sitting   Lower Body Dressing: Set up;Sit to/from stand   Toilet Transfer: Min guard   Toileting- Architect and Hygiene: Min guard;Sit to/from stand   Tub/ Shower Transfer: Min guard;Shower seat   Functional mobility during ADLs: Min guard       Vision   Vision Assessment?: No apparent visual deficits     Perception     Praxis      Pertinent Vitals/Pain Pain Assessment: Faces Faces Pain Scale: Hurts a little bit Pain Location: low back Pain Descriptors / Indicators: Aching     Hand Dominance Right   Extremity/Trunk Assessment Upper Extremity Assessment Upper Extremity Assessment: Overall WFL for  tasks assessed   Lower Extremity Assessment Lower Extremity Assessment: Defer to PT evaluation   Cervical / Trunk Assessment Cervical / Trunk Assessment: Normal   Communication Communication Communication: No difficulties   Cognition Arousal/Alertness: Awake/alert Behavior During Therapy: WFL for tasks  assessed/performed Overall Cognitive Status: Within Functional Limits for tasks assessed                                     General Comments  >92% on 2L in sitting, desat to 86% with amb on RA but able to recover to 90% with seated pursed lip breathing, returned 2L at EOS with SpO2 97%    Exercises     Shoulder Instructions      Home Living Family/patient expects to be discharged to:: Private residence Living Arrangements: Alone Available Help at Discharge: Friend(s);Available PRN/intermittently Type of Home: House Home Access: Stairs to enter [employer] of Steps: 1-2   Home Layout: One level     Bathroom Shower/Tub: Producer, television/film/video: Standard Bathroom Accessibility: No   Home Equipment: Environmental consultant - 2 wheels          Prior Functioning/Environment Level of Independence: Independent        Comments: Pt reports ind with community ambulation without AD, ind with ADLs, drives. Pt reports she has good friend support and they call each other daily to check on each other.        OT Problem List: Decreased activity tolerance;Impaired balance (sitting and/or standing);Cardiopulmonary status limiting activity      OT Treatment/Interventions: Self-care/ADL training;Therapeutic exercise;Energy conservation;DME and/or AE instruction;Patient/family education;Balance training;Therapeutic activities    OT Goals(Current goals can be found in the care plan section) Acute Rehab OT Goals Patient Stated Goal: maintain strength OT Goal Formulation: With patient Time For Goal Achievement: 06/14/20 Potential to Achieve Goals: Good  OT Frequency: Min 2X/week   Barriers to D/C:            Co-evaluation PT/OT/SLP Co-Evaluation/Treatment: Yes Reason for Co-Treatment: For patient/therapist safety;To address functional/ADL transfers PT goals addressed during session: Mobility/safety with mobility OT goals addressed during session: ADL's and  self-care      AM-PAC OT "6 Clicks" Daily Activity     Outcome Measure Help from another person eating meals?: None Help from another person taking care of personal grooming?: A Little Help from another person toileting, which includes using toliet, bedpan, or urinal?: A Little Help from another person bathing (including washing, rinsing, drying)?: A Little Help from another person to put on and taking off regular upper body clothing?: A Little Help from another person to put on and taking off regular lower body clothing?: A Little 6 Click Score: 19   End of Session Nurse Communication:  (requests for tylenol)  Activity Tolerance: Patient tolerated treatment well Patient left: in chair;with call bell/phone within reach  OT Visit Diagnosis: Unsteadiness on feet (R26.81);Muscle weakness (generalized) (M62.81)                Time: 4782-9562 OT Time Calculation (min): 22 min Charges:  OT General Charges $OT Visit: 1 Visit OT Evaluation $OT Eval Low Complexity: 1 Low  Kord Monette, OTR/L Acute Care Rehab Services  Office [phone] Pager: [phone]   Kelli Churn 05/31/2020, 1:54 PM

## 2020-05-31 NOTE — Evaluation (Signed)
Physical Therapy Evaluation Patient Details Name: Kristina Ramos MRN: [id number] DOB: 02/04/1941 Today's Date: 05/31/2020   History of Present Illness  79 y.o. female with medical history significant of follicular lymphoma, dyslipidemia, hypertension, hypothyroid, arthritis and depression. Patient reported 2 weeks of dry cough, persistent, no associated fevers, chills, or malaise.  Over the last 4 days her symptoms have rapidly worsened, patient with worsening cough and experiencing dyspnea, moderate to severe in intensity, worse with exertion, no improving factors, associated with generalized weakness and mild confusion.  Due to her worsening symptoms patient was taken to an urgent care where she was found hypoxic, rapid testing for COVID-19 resulted positive.  She was referred to the emergency department for further evaluation.    Clinical Impression  Pt admitted with above diagnosis. Pt limited with ambulation tolerance, desat to 86% on RA with ambulation requiring seated rest break. Pt with minimal coughing after ambulation bout. Pt prefers HHA over RW despite education on improved safety and independence; will continue to progress with least restrictive device. Pt with good friend support and motivated to return home. Pt currently with functional limitations due to the deficits listed below (see PT Problem List). Pt will benefit from skilled PT to increase their independence and safety with mobility to allow discharge to the venue listed below.       Follow Up Recommendations Home health PT    Equipment Recommendations  None recommended by PT    Recommendations for Other Services       Precautions / Restrictions Precautions Precautions: Fall Restrictions Weight Bearing Restrictions: No      Mobility  Bed Mobility  General bed mobility comments: in recliner upon arrival  Transfers Overall transfer level: Needs assistance Equipment used: None Transfers: Sit to/from Stand Sit to  Stand: Min guard  General transfer comment: BUE assisting to power up, slightly unsteady upon rising but improves with time, once on feet reaches for therapist's hand for security  Ambulation/Gait Ambulation/Gait assistance: Min guard Gait Distance (Feet): 30 Feet Assistive device: 1 person hand held assist Gait Pattern/deviations: Step-through pattern;Decreased stride length Gait velocity: decreased   General Gait Details: slow, cautious steps in room with HHA and declines RW use, mild increase work of breathing, on RA with SpO2 86% at end of ambulation, able to recover to 90% with seated rest and pursed lip breathing  Stairs            Wheelchair Mobility    Modified Rankin (Stroke Patients Only)       Balance Overall balance assessment: Needs assistance  Standing balance support: During functional activity;Single extremity supported Standing balance-Leahy Scale: Poor Standing balance comment: reliant on HHA              Pertinent Vitals/Pain Pain Assessment: No/denies pain    Home Living Family/patient expects to be discharged to:: Private residence Living Arrangements: Alone Available Help at Discharge: Friend(s);Available PRN/intermittently Type of Home: House Home Access: Stairs to enter   [employer] of Steps: 1-2 Home Layout: One level Home Equipment: Walker - 2 wheels      Prior Function Level of Independence: Independent         Comments: Pt reports ind with community ambulation without AD, ind with ADLs, drives. Pt reports she has good friend support and they call each other daily to check on each other.     Hand Dominance   Dominant Hand: Right    Extremity/Trunk Assessment   Upper Extremity Assessment Upper Extremity Assessment: Defer  to OT evaluation    Lower Extremity Assessment Lower Extremity Assessment: Overall WFL for tasks assessed (AROM WNL, strength 4/5, denies numbness/tingling)    Cervical / Trunk  Assessment Cervical / Trunk Assessment: Normal  Communication   Communication: No difficulties  Cognition Arousal/Alertness: Awake/alert Behavior During Therapy: WFL for tasks assessed/performed Overall Cognitive Status: Within Functional Limits for tasks assessed               General Comments General comments (skin integrity, edema, etc.): >92% on 2L in sitting, desat to 86% with amb on RA but able to recover to 90% with seated pursed lip breathing, returned 2L at EOS with SpO2 97%    Exercises     Assessment/Plan    PT Assessment Patient needs continued PT services  PT Problem List Decreased activity tolerance;Decreased balance;Decreased mobility;Decreased knowledge of use of DME;Cardiopulmonary status limiting activity       PT Treatment Interventions DME instruction;Gait training;Functional mobility training;Therapeutic activities;Therapeutic exercise;Balance training;Neuromuscular re-education;Patient/family education    PT Goals (Current goals can be found in the Care Plan section)  Acute Rehab PT Goals Patient Stated Goal: return home PT Goal Formulation: With patient Time For Goal Achievement: 06/07/20 Potential to Achieve Goals: Good    Frequency Min 3X/week   Barriers to discharge        Co-evaluation PT/OT/SLP Co-Evaluation/Treatment: Yes Reason for Co-Treatment: Complexity of the patient's impairments (multi-system involvement);To address functional/ADL transfers PT goals addressed during session: Mobility/safety with mobility;Balance         AM-PAC PT "6 Clicks" Mobility  Outcome Measure Help needed turning from your back to your side while in a flat bed without using bedrails?: A Little Help needed moving from lying on your back to sitting on the side of a flat bed without using bedrails?: A Little Help needed moving to and from a bed to a chair (including a wheelchair)?: A Little Help needed standing up from a chair using your arms (e.g.,  wheelchair or bedside chair)?: A Little Help needed to walk in hospital room?: A Little Help needed climbing 3-5 steps with a railing? : A Little 6 Click Score: 18    End of Session Equipment Utilized During Treatment: Oxygen Activity Tolerance: Patient limited by fatigue Patient left: in chair;with call bell/phone within reach Nurse Communication: Mobility status;Other (comment) (O2) PT Visit Diagnosis: Other abnormalities of gait and mobility (R26.89);Unsteadiness on feet (R26.81)    Time: 4403-4742 PT Time Calculation (min) (ACUTE ONLY): 22 min   Charges:   PT Evaluation $PT Eval Moderate Complexity: 1 Mod           Tori Demmi Sindt PT, DPT 05/31/20, 1:32 PM

## 2020-05-31 NOTE — Progress Notes (Signed)
PROGRESS NOTE  Kristina Ramos  JJK:[id number] DOB: 30-Nov-1941 DOA: 05/28/2020 PCP: Ann Held, DO  Brief Narrative: Kristina Gladwin Coxis a 79 y.o.femalewith medical history significant offollicular lymphoma, dyslipidemia, hypertension, hypothyroid, arthritis and depression who has been fully vaccinated for SARS-CoV-2 and presented initially to urgent care with 2 weeks of dry cough and 4 days of worsening with dyspnea, moderate to severe in intensity. Due to her worsening symptoms patient was taken to an urgent care where she was found hypoxic, rapid testing for COVID-19 resulted positive. She was referred to the emergency department for further evaluation. She had hypoxemia on ambulation down to 87% her white count is 2.2.Her chest radiograph had bilateral multifocal disease or infiltrates. Remdesivir and decadron started.  Assessment & Plan: Principal Problem:   Pneumonia due to COVID-19 virus Active Problems:   Hypothyroidism   HYPERLIPIDEMIA   Essential hypertension, benign   Obesity (BMI 30-39.9)   Iron deficiency anemia   Malignant lymphoma, lymphoplasmacytoid (HCC)   Follicular lymphoma grade ii, lymph nodes of multiple sites ([place])   Acute respiratory failure with hypoxia (HCC)   Hyponatremia  Acute hypoxemic respiratory failure due to covid-19 pneumonia: SARS-CoV-2 PCR positive on 6/7.  - Remains hypoxemic, though able to mobilize, she is a candidate for discharge on home oxygen after 5th dose of remdesivir. - Continues remdesivir x5 days 6/7 - 6/11.  - Steroids x10 days.  - Fortunately she is not worsening as I don't believe off-label use of tocilizumab would be prudent in this immunosuppressed oncology patient.  - Vitamin C, zinc - Encourage OOB, IS, FV, and awake proning if able - Tylenol and antitussives prn - Continue airborne, contact precautions while admitted. Isolation is recommended for 21 days from positive testing. - Check CRP in AM. LFTs have been wnl. -  Enoxaparin prophylactic dose.  - Maintain euvolemia/net negative.  - Avoid NSAIDs   Follicular lymphoma with leukopenia, iron deficiency anemia: She follows with Dr. 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 She had her 6 cycle of maintenance Gazyva on 04/03/20. - Dr. Marin Olp aware of admission.   Diarrhea: Likely due to covid. Abdomen very benign. - Imodium prn.   Obesity: Body mass index is 38.14 kg/m.   HTN:  - Can restart home medications.  Hyponatremia: Resolved.   Hypokalemia:  - Supplement as needed  Hypothyroidism: Last TSH 2.50 - Continue synthroid  DVT prophylaxis: Lovenox Code Status: Full Family Communication: None at bedside. Son by phone for 17 minutes Disposition Plan:  Status is: Inpatient  Remains inpatient appropriate because:Inpatient level of care appropriate due to severity of illness. Remains hypoxemic newly undergoing treatment for covid-19 pneumonia with high risk of clinical deterioration.  Dispo: The patient is from: Home              Anticipated d/c is to: Home              Anticipated d/c date is: 1 day              Patient currently is not medically stable to d/c.  Consultants:   None  Procedures:   None  Antimicrobials:  Remdesivir   Subjective: Loose stools today after long stent of constipation, has been eating better. Getting up without issues with balance but is short of breath on exertion. No chest pain. +cough.   Objective: Vitals:   05/30/20 2047 05/31/20 0400 05/31/20 0503 05/31/20 1235  BP: (!) 152/83 (!) 150/85 131/72 Marland Kitchen)  151/81  Pulse: 89 92 83 82  Resp: 20 20 19 16   Temp: 97.7 F (36.5 C) 98 F (36.7 C) 97.8 F (36.6 C) 98.5 F (36.9 C)  TempSrc: Oral Oral Oral   SpO2: 95%  96% 96%  Weight:      Height:       No intake or output data in the 24 hours ending 05/31/20 1729 Filed Weights   05/28/20 1526 05/28/20 2320  Weight: 99.8 kg  100.8 kg   Gen: 79 y.o. female in no distress Pulm: Nonlabored breathing 2LPM, tachypneic. Crackles stable. CV: Regular rate and rhythm. No murmur, rub, or gallop. No JVD, no pitting dependent edema. GI: Abdomen soft, non-tender, non-distended, with normoactive bowel sounds.  Ext: Warm, no deformities Skin: No rashes, lesions or ulcers on visualized skin. Neuro: Alert and oriented. No focal neurological deficits. Psych: Judgement and insight appear fair. Mood euthymic & affect congruent. Behavior is appropriate.    Data Reviewed: I have personally reviewed following labs and imaging studies  CBC: Recent Labs  Lab 05/28/20 1526  WBC 2.2*  NEUTROABS 1.5*  HGB 11.2*  HCT 31.1*  MCV 81.4  PLT 638   Basic Metabolic Panel: Recent Labs  Lab 05/28/20 1526 05/29/20 0536 05/30/20 0456 05/31/20 0445  NA 123* 130* 132* 135  K 3.7 2.9* 4.1 3.6  CL 91* 94* 99 103  CO2 19* 20* 21* 19*  GLUCOSE 96 144* 177* 150*  BUN 10 12 15 16   CREATININE 0.71 0.53 0.59 0.66  CALCIUM 8.0* 8.6* 8.4* 8.7*  MG  --   --  1.8  --    GFR: Estimated Creatinine Clearance: 66.9 mL/min (by C-G formula based on SCr of 0.66 mg/dL). Liver Function Tests: Recent Labs  Lab 05/29/20 0536 05/30/20 0456 05/31/20 0445  AST 28 21 25   ALT 17 16 16   ALKPHOS 50 48 47  BILITOT 0.9 0.5 0.7  PROT 6.2* 5.7* 5.8*  ALBUMIN 3.1* 3.1* 3.1*   No results for input(s): LIPASE, AMYLASE in the last 168 hours. No results for input(s): AMMONIA in the last 168 hours. Coagulation Profile: No results for input(s): INR, PROTIME in the last 168 hours. Cardiac Enzymes: No results for input(s): CKTOTAL, CKMB, CKMBINDEX, TROPONINI in the last 168 hours. BNP (last 3 results) No results for input(s): PROBNP in the last 8760 hours. HbA1C: No results for input(s): HGBA1C in the last 72 hours. CBG: No results for input(s): GLUCAP in the last 168 hours. Lipid Profile: No results for input(s): CHOL, HDL, LDLCALC, TRIG, CHOLHDL,  LDLDIRECT in the last 72 hours. Thyroid Function Tests: No results for input(s): TSH, T4TOTAL, FREET4, T3FREE, THYROIDAB in the last 72 hours. Anemia Panel: Recent Labs    05/29/20 0536 05/30/20 0456  FERRITIN 1,444* 1,435*   Urine analysis: No results found for: COLORURINE, APPEARANCEUR, LABSPEC, PHURINE, GLUCOSEU, HGBUR, BILIRUBINUR, KETONESUR, PROTEINUR, UROBILINOGEN, NITRITE, LEUKOCYTESUR Recent Results (from the past 240 hour(s))  SARS Coronavirus 2 by RT PCR (hospital order, performed in [facility] hospital lab) Nasopharyngeal Nasopharyngeal Swab     Status: Abnormal   Collection Time: 05/28/20  5:26 PM   Specimen: Nasopharyngeal Swab  Result Value Ref Range Status   SARS Coronavirus 2 POSITIVE (A) NEGATIVE Final    Comment: RESULT CALLED TO, READ BACK BY AND VERIFIED WITH: NASH,J. RN @2024  05/28/20 BILLINGSLEY,L (NOTE) SARS-CoV-2 target nucleic acids are DETECTED SARS-CoV-2 RNA is generally detectable in upper respiratory specimens  during the acute phase of infection.  Positive results are indicative  of the presence of the identified virus, but do not rule out bacterial infection or co-infection with other pathogens not detected by the test.  Clinical correlation with patient history and  other diagnostic information is necessary to determine patient infection status.  The expected result is negative. Fact Sheet for Patients:   StrictlyIdeas.no  Fact Sheet for Healthcare Providers:   BankingDealers.co.za   This test is not yet approved or cleared by the [place] FDA and  has been authorized for detection and/or diagnosis of SARS-CoV-2 by FDA under an Emergency Use Authorization (EUA).  This EUA will remain in effect (meaning this test ca n be used) for the duration of  the COVID-19 declaration under Section 564(b)(1) of the Act, 21 U.S.C. section 360-bbb-3(b)(1), unless the authorization is terminated or revoked  sooner. Performed at [facility], [place] [address] [place], [place] [address]       Radiology Studies: No results found.  Scheduled Meds: . vitamin C  500 mg Oral Daily  . chlorpheniramine-HYDROcodone  5 mL Oral Q12H  . dexamethasone (DECADRON) injection  6 mg Intravenous Q24H  . enoxaparin (LOVENOX) injection  50 mg Subcutaneous Q24H  . folic acid  1 mg Oral Daily  . Ipratropium-Albuterol  1 puff Inhalation QID  . levothyroxine  75 mcg Oral Daily  . multivitamin with minerals  1 tablet Oral Daily  . zinc sulfate  220 mg Oral Daily   Continuous Infusions: . remdesivir 100 mg in NS 100 mL 100 mg (05/31/20 1011)     LOS: 3 days   Time spent: 25 minutes.  Patrecia Pour, MD [facility] [email or web address] 05/31/2020, 5:29 PM

## 2020-05-31 NOTE — Progress Notes (Signed)
SATURATION QUALIFICATIONS: (This note is used to comply with regulatory documentation for home oxygen)  Patient Saturations on Room Air at Rest = 87%  Patient Saturations on Room Air while Ambulating = 80%  Patient Saturations on 2 Liters of oxygen while Ambulating = 92%  Please briefly explain why patient needs home oxygen: patient desaturates with any exertion

## 2020-06-01 LAB — C-REACTIVE PROTEIN: CRP: 3.6 mg/dL — ABNORMAL HIGH (ref <1.0)

## 2020-06-01 MED ORDER — DEXAMETHASONE 6 MG PO TABS: 6.0000 mg | ORAL_TABLET | Freq: Every day | ORAL | 0 refills | Status: AC

## 2020-06-01 MED ORDER — LOPERAMIDE HCL 2 MG PO CAPS: 2.0000 mg | ORAL_CAPSULE | ORAL | 0 refills | Status: AC | PRN

## 2020-06-01 NOTE — TOC Progression Note (Addendum)
Transition of Care [facility]) - Progression Note    Patient Details  Name: Kristina Ramos MRN: [id number] Date of Birth: Mar 15, 1941  [facility]) CM/SW Contact  Purcell Mouton, RN Phone Number: 06/01/2020, 10:50 AM  Clinical Narrative:    Pt's O2 was delivered. Pt's daughter will transport her home. TOC signing off.    Expected Discharge Plan: Home/Self Care Barriers to Discharge: No Barriers Identified  Expected Discharge Plan and Services Expected Discharge Plan: Home/Self Care   Discharge Planning Services: CM Consult   Living arrangements for the past 2 months: Single Family Home Expected Discharge Date: 06/01/20                                     Social Determinants of Health (SDOH) Interventions    Readmission Risk Interventions No flowsheet data found.

## 2020-06-01 NOTE — Progress Notes (Signed)
Discharged to home instructions reviewed with patient, acknowledged understanding. Reviewed instructions with pt daughter and home O2 use along with the number to call [place] for 02 delivery. SRP, RN

## 2020-06-01 NOTE — Discharge Summary (Signed)
Physician Discharge Summary  Kristina Ramos LPF:[id number] DOB: 09/05/1941 DOA: 05/28/2020  PCP: Ann Held, DO  Admit date: 05/28/2020 Discharge date: 06/01/2020  Admitted From: Home Disposition: Home   Recommendations for Outpatient Follow-up:  1. Follow up with PCP in 1 week with telehealth visit 2. Follow up ambulatory pulse oximetry to see if/when patient's oxygen requirement abates. 3. Follow up with oncology, Dr. Marin Olp per routine.  Home Health: PT Equipment/Devices: Has a walker, 2L O2 Discharge Condition: Stable CODE STATUS: Full Diet recommendation: Heart healthy  Brief/Interim Summary: Kristina Ramos a 79 y.o.femalewith medical history significant offollicular lymphoma, dyslipidemia, hypertension, hypothyroid, arthritis and depression who has been fully vaccinated for SARS-CoV-2 and presented initially to urgent care with 2 weeks of dry cough and 4 days of worsening with dyspnea, moderate to severe in intensity. Due to her worsening symptoms patient was taken to an urgent care where she was found hypoxic, rapid testing for COVID-19 resulted positive. She was referred to the emergency department for further evaluation. There, she had hypoxemia on ambulation down to 87% and was leukopenic.Her chest radiograph had bilateral multifocal infiltrates. Remdesivir and decadron started and the patient was admitted. Hypoxemia remains though respiratory status has improved and she has exhibited functional mobility adequate for return home to continue recovery. Inflammatory markers have sustained negative trajectory, and she is able to take oral decadron for 5 days following discharge.  Discharge Diagnoses:  Principal Problem:   Pneumonia due to COVID-19 virus Active Problems:   Hypothyroidism   HYPERLIPIDEMIA   Essential hypertension, benign   Obesity (BMI 30-39.9)   Iron deficiency anemia   Malignant lymphoma, lymphoplasmacytoid (HCC)   Follicular lymphoma grade ii,  lymph nodes of multiple sites ([place])   Acute respiratory failure with hypoxia (HCC)   Hyponatremia  Acute hypoxemic respiratory failure due to covid-19 pneumonia: SARS-CoV-2 PCR positive on 6/7.  - Remains hypoxemic, though able to mobilize and stable respiratory status at discharge.  - Completed remdesivir x5 days 6/7 - 6/11. LFTs have been wnl. - Steroids x10 days Rx at DC.  - Continue airborne, contact precautions for 21 days from positive testing.  - Return precautions provided  Follicular lymphoma with leukopenia, iron deficiency anemia: She follows with Dr. 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 She had her 6 cycle of maintenance Gazyva on 04/03/20. - Dr. Marin Olp aware of admission.   Diarrhea: Likely due to covid. Abdomen benign. - Imodium prn. Avoid antibiotics  Obesity: Body mass index is 38.14 kg/m.   HTN:  - Can restart home medications.  Hyponatremia: Resolved.   Hypokalemia:  - Supplement as needed  Hypothyroidism: Last TSH 2.50 - Continue synthroid  Discharge Instructions Discharge Instructions    Discharge instructions   Complete by: As directed    You are being discharged from the hospital after treatment for covid-19 infection. You are felt to be stable enough to no longer require inpatient monitoring, testing, and treatment, though you will need to follow the recommendations below: - Continue taking decadron for 5 more days (next dose due tomorrow morning) - Continue using oxygen continuously. Please purchase a pulse oximeter. If your oxygen level remains 90% or higher, you can wean oxygen especially at rest.  - You may take imodium as needed for loose stools. - Per CDC guidelines, you will need to remain in isolation for 21 days from your first positive covid test. Close contacts will need to remain on quarantine for 7  days after their last contact with you (if they take a  test and it's negative) - Do not take NSAID medications (including, but not limited to, ibuprofen, advil, motrin, naproxen, aleve, goody's powder, etc.) - Follow up with your doctor in the next week via telehealth or seek medical attention right away if your symptoms get WORSE.   Directions for you at home:  Wear a facemask You should wear a facemask that covers your nose and mouth when you are in the same room with other people and when you visit a healthcare provider. People who live with or visit you should also wear a facemask while they are in the same room with you.  Separate yourself from other people in your home As much as possible, you should stay in a different room from other people in your home. Also, you should use a separate bathroom, if available.  Avoid sharing household items You should not share dishes, drinking glasses, cups, eating utensils, towels, bedding, or other items with other people in your home. After using these items, you should wash them thoroughly with soap and water.  Cover your coughs and sneezes Cover your mouth and nose with a tissue when you cough or sneeze, or you can cough or sneeze into your sleeve. Throw used tissues in a lined trash can, and immediately wash your hands with soap and water for at least 20 seconds or use an alcohol-based hand rub.  Wash your [facility] your hands often and thoroughly with soap and water for at least 20 seconds. You can use an alcohol-based hand sanitizer if soap and water are not available and if your hands are not visibly dirty. Avoid touching your eyes, nose, and mouth with unwashed hands.  Directions for those who live with, or provide care at home for you:  Limit the number of people who have contact with the patient If possible, have only one caregiver for the patient. Other household members should stay in another home or place of residence. If this is not possible, they should stay in another room, or  be separated from the patient as much as possible. Use a separate bathroom, if available. Restrict visitors who do not have an essential need to be in the home.  Ensure good ventilation Make sure that shared spaces in the home have good air flow, such as from an air conditioner or an opened window, weather permitting.  Wash your hands often Wash your hands often and thoroughly with soap and water for at least 20 seconds. You can use an alcohol based hand sanitizer if soap and water are not available and if your hands are not visibly dirty. Avoid touching your eyes, nose, and mouth with unwashed hands. Use disposable paper towels to dry your hands. If not available, use dedicated cloth towels and replace them when they become wet.  Wear a facemask and gloves Wear a disposable facemask at all times in the room and gloves when you touch or have contact with the patient's blood, body fluids, and/or secretions or excretions, such as sweat, saliva, sputum, nasal mucus, vomit, urine, or feces.  Ensure the mask fits over your nose and mouth tightly, and do not touch it during use. Throw out disposable facemasks and gloves after using them. Do not reuse. Wash your hands immediately after removing your facemask and gloves. If your personal clothing becomes contaminated, carefully remove clothing and launder. Wash your hands after handling contaminated clothing. Place all used disposable facemasks, gloves,  and other waste in a lined container before disposing them with other household waste. Remove gloves and wash your hands immediately after handling these items.  Do not share dishes, glasses, or other household items with the patient Avoid sharing household items. You should not share dishes, drinking glasses, cups, eating utensils, towels, bedding, or other items with a patient who is confirmed to have, or being evaluated for, COVID-19 infection. After the person uses these items, you should wash them  thoroughly with soap and water.  Wash laundry thoroughly Immediately remove and wash clothes or bedding that have blood, body fluids, and/or secretions or excretions, such as sweat, saliva, sputum, nasal mucus, vomit, urine, or feces, on them. Wear gloves when handling laundry from the patient. Read and follow directions on labels of laundry or clothing items and detergent. In general, wash and dry with the warmest temperatures recommended on the label.  Clean all areas the individual has used often Clean all touchable surfaces, such as counters, tabletops, doorknobs, bathroom fixtures, toilets, phones, keyboards, tablets, and bedside tables, every day. Also, clean any surfaces that may have blood, body fluids, and/or secretions or excretions on them. Wear gloves when cleaning surfaces the patient has come in contact with. Use a diluted bleach solution (e.g., dilute bleach with 1 part bleach and 10 parts water) or a household disinfectant with a label that says EPA-registered for coronaviruses. To make a bleach solution at home, add 1 tablespoon of bleach to 1 quart (4 cups) of water. For a larger supply, add  cup of bleach to 1 gallon (16 cups) of water. Read labels of cleaning products and follow recommendations provided on product labels. Labels contain instructions for safe and effective use of the cleaning product including precautions you should take when applying the product, such as wearing gloves or eye protection and making sure you have good ventilation during use of the product. Remove gloves and wash hands immediately after cleaning.  Monitor yourself for signs and symptoms of illness Caregivers and household members are considered close contacts, should monitor their health, and will be asked to limit movement outside of the home to the extent possible. Follow the monitoring steps for close contacts listed on the symptom monitoring form.  If you have additional questions, contact  your local health department or call the epidemiologist on call at [phone] (available 24/7). This guidance is subject to change. For the most up-to-date guidance from [facility], please refer to their website: YouBlogs.pl   Increase activity slowly   Complete by: As directed    MyChart COVID-19 home monitoring program   Complete by: Jun 01, 2020    Is the patient willing to use the [place] for home monitoring?: Yes     Allergies as of 06/01/2020      Reactions   Aspirin Other (See Comments)   REACTION: pt states that it irritates her stomach, coated ASA is ok      Medication List    STOP taking these medications   doxycycline 100 MG tablet Commonly known as: VIBRA-TABS   lisinopril-hydrochlorothiazide 10-12.5 MG tablet Commonly known as: ZESTORETIC     TAKE these medications   amoxicillin 500 MG capsule Commonly known as: AMOXIL Take 2,000 mg by mouth as needed. only for dental procedeure   dexamethasone 6 MG tablet Commonly known as: DECADRON Take 1 tablet (6 mg total) by mouth daily.   famciclovir 250 MG tablet Commonly known as: FAMVIR TAKE 1 TABLET BY MOUTH EVERY DAY  hydrochlorothiazide 12.5 MG tablet Commonly known as: HYDRODIURIL TAKE 1 TABLET BY MOUTH EVERY DAY   levothyroxine 75 MCG tablet Commonly known as: SYNTHROID Take 1 tablet (75 mcg total) by mouth daily.   lisinopril 10 MG tablet Commonly known as: ZESTRIL TAKE 1 TABLET BY MOUTH EVERY DAY   loperamide 2 MG capsule Commonly known as: IMODIUM Take 1 capsule (2 mg total) by mouth as needed for diarrhea or loose stools.   Vitamin D 50 MCG (2000 UT) Caps Take 1 capsule by mouth daily.            Durable Medical Equipment  (From admission, onward)         Start     Ordered   06/01/20 0733  For home use only DME oxygen  Once       Question Answer Comment  Length of Need 6 Months   Mode or (Route) Nasal cannula    Liters per Minute 2   Oxygen delivery system Gas      06/01/20 0733          Follow-up Information    Roma Schanz R, DO. Schedule an appointment as soon as possible for a visit in 1 week(s).   Specialty: Family Medicine Contact information: [address] [place] [address] [phone]        Volanda Napoleon, MD Follow up.   Specialty: Oncology Contact information: [place] [address] [phone]              Allergies  Allergen Reactions  . Aspirin Other (See Comments)    REACTION: pt states that it irritates her stomach, coated ASA is ok    Consultations:  None  Procedures/Studies: DG Chest Portable 1 View  Result Date: 05/28/2020 CLINICAL DATA:  Cough and shortness of breath. Reported COVID-19 positive EXAM: PORTABLE CHEST 1 VIEW COMPARISON:  May 03, 2018 FINDINGS: There is ill-defined airspace opacity in each mid and lower lung region as well as in the right upper lobe. No consolidation. Heart size and pulmonary vascularity are normal. No adenopathy. No bone lesions. IMPRESSION: Scattered areas of ill-defined opacity bilaterally, likely due to multifocal atypical organism pneumonia. Cardiac silhouette normal. No adenopathy appreciable by radiography. Electronically Signed   By: Lowella Grip III M.D.   On: 05/28/2020 16:12       Subjective: Breathing is stable, no dyspnea at rest, moderate on exertion though hypoxia resolved with 2L O2 by [place]. No abdominal pain, nausea or vomiting.  Discharge Exam: Vitals:   05/31/20 2049 06/01/20 0521  BP: (!) 149/82 (!) 154/84  Pulse: 86 91  Resp: 18 18  Temp: 98 F (36.7 C)   SpO2: 96% 92%   General: Pt is alert, awake, not in acute distress Cardiovascular: RRR, S1/S2 +, no rubs, no gallops Respiratory: Nonlabored, crackles have resolved, no wheezes. Abdominal: Soft, NT, ND, bowel sounds + Extremities: No edema, no cyanosis  Labs: BNP (last  3 results) No results for input(s): BNP in the last 8760 hours. Basic Metabolic Panel: Recent Labs  Lab 05/28/20 1526 05/29/20 0536 05/30/20 0456 05/31/20 0445  NA 123* 130* 132* 135  K 3.7 2.9* 4.1 3.6  CL 91* 94* 99 103  CO2 19* 20* 21* 19*  GLUCOSE 96 144* 177* 150*  BUN 10 12 15 16   CREATININE 0.71 0.53 0.59 0.66  CALCIUM 8.0* 8.6* 8.4* 8.7*  MG  --   --  1.8  --  Liver Function Tests: Recent Labs  Lab 05/29/20 0536 05/30/20 0456 05/31/20 0445  AST 28 21 25   ALT 17 16 16   ALKPHOS 50 48 47  BILITOT 0.9 0.5 0.7  PROT 6.2* 5.7* 5.8*  ALBUMIN 3.1* 3.1* 3.1*   No results for input(s): LIPASE, AMYLASE in the last 168 hours. No results for input(s): AMMONIA in the last 168 hours. CBC: Recent Labs  Lab 05/28/20 1526  WBC 2.2*  NEUTROABS 1.5*  HGB 11.2*  HCT 31.1*  MCV 81.4  PLT 240   Cardiac Enzymes: No results for input(s): CKTOTAL, CKMB, CKMBINDEX, TROPONINI in the last 168 hours. BNP: Invalid input(s): POCBNP CBG: No results for input(s): GLUCAP in the last 168 hours. D-Dimer Recent Labs    05/30/20 0456 05/31/20 0445  DDIMER 2.61* 2.39*   Hgb A1c No results for input(s): HGBA1C in the last 72 hours. Lipid Profile No results for input(s): CHOL, HDL, LDLCALC, TRIG, CHOLHDL, LDLDIRECT in the last 72 hours. Thyroid function studies No results for input(s): TSH, T4TOTAL, T3FREE, THYROIDAB in the last 72 hours.  Invalid input(s): FREET3 Anemia work up [employer]    05/30/20 0456  FERRITIN 1,435*   Urinalysis No results found for: COLORURINE, APPEARANCEUR, [place], [place], [place], [place], [place], [place], [place], UROBILINOGEN, NITRITE, [place]  Microbiology Recent Results (from the past 240 hour(s))  SARS Coronavirus 2 by RT PCR (hospital order, performed in [facility] hospital lab) Nasopharyngeal Nasopharyngeal Swab     Status: Abnormal   Collection Time: 05/28/20  5:26 PM   Specimen: Nasopharyngeal Swab  Result Value Ref  Range Status   SARS Coronavirus 2 POSITIVE (A) NEGATIVE Final    Comment: RESULT CALLED TO, READ BACK BY AND VERIFIED WITH: NASH,J. RN @2024  05/28/20 BILLINGSLEY,L (NOTE) SARS-CoV-2 target nucleic acids are DETECTED SARS-CoV-2 RNA is generally detectable in upper respiratory specimens  during the acute phase of infection.  Positive results are indicative  of the presence of the identified virus, but do not rule out bacterial infection or co-infection with other pathogens not detected by the test.  Clinical correlation with patient history and  other diagnostic information is necessary to determine patient infection status.  The expected result is negative. Fact Sheet for Patients:   StrictlyIdeas.no  Fact Sheet for Healthcare Providers:   BankingDealers.co.za   This test is not yet approved or cleared by the [place] FDA and  has been authorized for detection and/or diagnosis of SARS-CoV-2 by FDA under an Emergency Use Authorization (EUA).  This EUA will remain in effect (meaning this test ca n be used) for the duration of  the COVID-19 declaration under Section 564(b)(1) of the Act, 21 U.S.C. section 360-bbb-3(b)(1), unless the authorization is terminated or revoked sooner. Performed at [facility], [place] [address] [place],  [address]     Time coordinating discharge: Approximately 40 minutes  Patrecia Pour, MD  [facility] 06/01/2020, 5:02 PM

## 2020-06-01 NOTE — TOC Progression Note (Addendum)
Transition of Care [facility]) - Progression Note    Patient Details  Name: Kristina Ramos MRN: [id number] Date of Birth: January 03, 1941  Transition of Care [facility]) CM/SW Contact  Purcell Mouton, RN Phone Number: 06/01/2020, 9:08 AM  Clinical Narrative:    Pt plan to discharge home with [facility] [place] and [facility]. Waiting to see if she will need PTAR to transport home.     Expected Discharge Plan: Home/Self Care Barriers to Discharge: No Barriers Identified  Expected Discharge Plan and Services Expected Discharge Plan: Home/Self Care   Discharge Planning Services: CM Consult   Living arrangements for the past 2 months: Single Family Home Expected Discharge Date: 06/01/20                                     Social Determinants of Health (SDOH) Interventions    Readmission Risk Interventions No flowsheet data found.

## 2020-06-01 NOTE — Progress Notes (Signed)
Son called to inquire about home medications. Detailed discharge instructions was given to pt and daughter, both acknowledged understanding of instructions. Went over med again with son over phone and questions addressed. SRP, RN

## 2020-06-04 ENCOUNTER — Telehealth: Payer: Self-pay | Admitting: *Deleted

## 2020-06-04 ENCOUNTER — Other Ambulatory Visit: Payer: Self-pay

## 2020-06-04 NOTE — Patient Outreach (Signed)
[facility]) Care Management  06/04/2020  Albirta Rhinehart Koslow 1941-04-13 [id number]   Red Emmi: Date of call:  05/24/2020  Reason for red alert:   Got discharge papers- I don't know.  Placed call to patient and she reports she did find her paperwork. Reports she is not sure who her home health is with. Reviewed chart and [facility] is doing oxygen and [facility] is doing home health PT.  Patient reports she is on her oxygen and as long as she is on her oxygen she is doing well.  Reviewed discharge with patient and she voiced understanding of instructions.  PLAN: no needs identified. Case closed. All questions answered.   Tomasa Rand, RN, BSN, CEN [facility] [employer] [phone]

## 2020-06-04 NOTE — Telephone Encounter (Signed)
1st attempt. Unable to reach patient.   

## 2020-06-05 ENCOUNTER — Telehealth: Payer: Self-pay | Admitting: Family Medicine

## 2020-06-05 DIAGNOSIS — D630 Anemia in neoplastic disease: Secondary | ICD-10-CM | POA: Diagnosis not present

## 2020-06-05 DIAGNOSIS — Z86018 Personal history of other benign neoplasm: Secondary | ICD-10-CM | POA: Diagnosis not present

## 2020-06-05 DIAGNOSIS — U071 COVID-19: Secondary | ICD-10-CM | POA: Diagnosis not present

## 2020-06-05 DIAGNOSIS — C8218 Follicular lymphoma grade II, lymph nodes of multiple sites: Secondary | ICD-10-CM | POA: Diagnosis not present

## 2020-06-05 DIAGNOSIS — C8300 Small cell B-cell lymphoma, unspecified site: Secondary | ICD-10-CM | POA: Diagnosis not present

## 2020-06-05 DIAGNOSIS — Z96652 Presence of left artificial knee joint: Secondary | ICD-10-CM | POA: Diagnosis not present

## 2020-06-05 DIAGNOSIS — D509 Iron deficiency anemia, unspecified: Secondary | ICD-10-CM | POA: Diagnosis not present

## 2020-06-05 DIAGNOSIS — E039 Hypothyroidism, unspecified: Secondary | ICD-10-CM | POA: Diagnosis not present

## 2020-06-05 DIAGNOSIS — I672 Cerebral atherosclerosis: Secondary | ICD-10-CM | POA: Diagnosis not present

## 2020-06-05 DIAGNOSIS — Z9181 History of falling: Secondary | ICD-10-CM | POA: Diagnosis not present

## 2020-06-05 DIAGNOSIS — E876 Hypokalemia: Secondary | ICD-10-CM | POA: Diagnosis not present

## 2020-06-05 DIAGNOSIS — I10 Essential (primary) hypertension: Secondary | ICD-10-CM | POA: Diagnosis not present

## 2020-06-05 DIAGNOSIS — K219 Gastro-esophageal reflux disease without esophagitis: Secondary | ICD-10-CM | POA: Diagnosis not present

## 2020-06-05 DIAGNOSIS — E669 Obesity, unspecified: Secondary | ICD-10-CM | POA: Diagnosis not present

## 2020-06-05 DIAGNOSIS — J1281 Pneumonia due to SARS-associated coronavirus: Secondary | ICD-10-CM | POA: Diagnosis not present

## 2020-06-05 DIAGNOSIS — Z79899 Other long term (current) drug therapy: Secondary | ICD-10-CM | POA: Diagnosis not present

## 2020-06-05 DIAGNOSIS — J9691 Respiratory failure, unspecified with hypoxia: Secondary | ICD-10-CM | POA: Diagnosis not present

## 2020-06-05 DIAGNOSIS — Z6837 Body mass index (BMI) 37.0-37.9, adult: Secondary | ICD-10-CM | POA: Diagnosis not present

## 2020-06-05 DIAGNOSIS — F329 Major depressive disorder, single episode, unspecified: Secondary | ICD-10-CM | POA: Diagnosis not present

## 2020-06-05 DIAGNOSIS — Z9981 Dependence on supplemental oxygen: Secondary | ICD-10-CM | POA: Diagnosis not present

## 2020-06-05 DIAGNOSIS — E785 Hyperlipidemia, unspecified: Secondary | ICD-10-CM | POA: Diagnosis not present

## 2020-06-05 NOTE — Telephone Encounter (Signed)
I have made two attempts and have been unable to reach patient. Pt has hospital follow up scheduled w/ PCP 06/06/20.

## 2020-06-05 NOTE — Telephone Encounter (Signed)
Yes but will she need visit before I sign?  I think there at least needs to be video visit ?

## 2020-06-05 NOTE — Telephone Encounter (Signed)
Please advise. You have seen patient one time

## 2020-06-05 NOTE — Telephone Encounter (Signed)
[place]   Call Back # [phone]  Calling to see if Etter Sjogren will sign orders for patient to have Home Health care , OT/PT .   Please Advise

## 2020-06-06 ENCOUNTER — Encounter: Payer: Self-pay | Admitting: Family Medicine

## 2020-06-06 ENCOUNTER — Other Ambulatory Visit: Payer: Self-pay

## 2020-06-06 ENCOUNTER — Telehealth (INDEPENDENT_AMBULATORY_CARE_PROVIDER_SITE_OTHER): Payer: Medicare Other | Admitting: Family Medicine

## 2020-06-06 ENCOUNTER — Telehealth: Payer: Self-pay | Admitting: Hematology & Oncology

## 2020-06-06 DIAGNOSIS — U071 COVID-19: Secondary | ICD-10-CM | POA: Diagnosis not present

## 2020-06-06 DIAGNOSIS — J1282 Pneumonia due to coronavirus disease 2019: Secondary | ICD-10-CM

## 2020-06-06 NOTE — Progress Notes (Signed)
.  Virtual Visit via Video Note  I connected with Kristina Ramos on 06/06/20 at 11:00 AM EDT by a video enabled telemedicine application and verified that I am speaking with the correct person using two identifiers.  Location: Patient: home with sone Provider: home    I discussed the limitations of evaluation and management by telemedicine and the availability of in person appointments. The patient expressed understanding and agreed to proceed.  History of Present Illness: Pt is home f/u hosp for covid -- she was admitted 6/7-6/11 she was dx with covid in uc and found to be hypoxic with pulse ox 88% sob / cough  and was sent to er for admission .  She was given remdesavir and decadron in er  She was d/c home with O2 and 5 more days of decadron.  Pt states she is feeling better everyday but is still weak and coughing.    During the visit the pt and her son realized she had an inhaler and had not been using it  Observations/Objective: Pulse ox 91-95% Hr 90  Temp-- afebrile  Pt is in nad  No sob --- has her o2 on -- she is fatigued   Assessment and Plan: 1. Pneumonia due to COVID-19 virus Pt is improving  She is finishing the decadron from the hosp She will start using the inhaler and cont robitussin dm  Pt is able to come to office after 21 days from + test  Wait to let people visit until then as well  Pt finished remdesivir x 5 days in hospital  rx steroids for 10 days was given at d/c Son asked about ivermectin --- -it was investigational but was found not to work so is no longer used  Return precautions d/w with pt and her son    Follow Up Instructions:    I discussed the assessment and treatment plan with the patient. The patient was provided an opportunity to ask questions and all were answered. The patient agreed with the plan and demonstrated an understanding of the instructions.   The patient was advised to call back or seek an in-person evaluation if the symptoms worsen or  if the condition fails to improve as anticipated.  I provided 25 minutes of non-face-to-face time during this encounter.   Ann Held, DO

## 2020-06-06 NOTE — Telephone Encounter (Signed)
Appointments rescheduled due to provider [facility] updated calendar & letter mailed

## 2020-06-06 NOTE — TOC Progression Note (Signed)
Transition of Care [facility]) - Progression Note    Patient Details  Name: Kristina Ramos MRN: [id number] Date of Birth: February 23, 1941  Transition of Care [facility]) CM/SW Contact  Purcell Mouton, RN Phone Number: 06/06/2020, 10:10 AM  Clinical Narrative:    A text from Hospitalists Secretary received stating that pt's daughter called asking about home health. A call to [facility] revealed that [facility] started care on 6/15, PT will see pt in AM, OT & RN on Friday.   Expected Discharge Plan: Home/Self Care Barriers to Discharge: No Barriers Identified  Expected Discharge Plan and Services Expected Discharge Plan: Home/Self Care   Discharge Planning Services: CM Consult   Living arrangements for the past 2 months: Single Family Home Expected Discharge Date: 06/01/20                                     Social Determinants of Health (SDOH) Interventions    Readmission Risk Interventions No flowsheet data found.

## 2020-06-06 NOTE — Telephone Encounter (Signed)
You had a virtual with patient today. Can you sign off on OT/PT now?

## 2020-06-07 DIAGNOSIS — I10 Essential (primary) hypertension: Secondary | ICD-10-CM | POA: Diagnosis not present

## 2020-06-07 DIAGNOSIS — J1281 Pneumonia due to SARS-associated coronavirus: Secondary | ICD-10-CM | POA: Diagnosis not present

## 2020-06-07 DIAGNOSIS — J9691 Respiratory failure, unspecified with hypoxia: Secondary | ICD-10-CM | POA: Diagnosis not present

## 2020-06-07 DIAGNOSIS — C8300 Small cell B-cell lymphoma, unspecified site: Secondary | ICD-10-CM | POA: Diagnosis not present

## 2020-06-07 DIAGNOSIS — C8218 Follicular lymphoma grade II, lymph nodes of multiple sites: Secondary | ICD-10-CM | POA: Diagnosis not present

## 2020-06-07 DIAGNOSIS — U071 COVID-19: Secondary | ICD-10-CM | POA: Diagnosis not present

## 2020-06-07 NOTE — Telephone Encounter (Signed)
yes

## 2020-06-07 NOTE — Telephone Encounter (Signed)
Spoke with [employer]. She will send orders over to sign

## 2020-06-08 DIAGNOSIS — J9691 Respiratory failure, unspecified with hypoxia: Secondary | ICD-10-CM | POA: Diagnosis not present

## 2020-06-08 DIAGNOSIS — U071 COVID-19: Secondary | ICD-10-CM | POA: Diagnosis not present

## 2020-06-08 DIAGNOSIS — J1281 Pneumonia due to SARS-associated coronavirus: Secondary | ICD-10-CM | POA: Diagnosis not present

## 2020-06-08 DIAGNOSIS — I10 Essential (primary) hypertension: Secondary | ICD-10-CM | POA: Diagnosis not present

## 2020-06-08 DIAGNOSIS — C8300 Small cell B-cell lymphoma, unspecified site: Secondary | ICD-10-CM | POA: Diagnosis not present

## 2020-06-08 DIAGNOSIS — C8218 Follicular lymphoma grade II, lymph nodes of multiple sites: Secondary | ICD-10-CM | POA: Diagnosis not present

## 2020-06-09 ENCOUNTER — Emergency Department (HOSPITAL_COMMUNITY): Payer: Medicare Other

## 2020-06-09 ENCOUNTER — Other Ambulatory Visit: Payer: Self-pay

## 2020-06-09 ENCOUNTER — Inpatient Hospital Stay (HOSPITAL_COMMUNITY)
Admission: EM | Admit: 2020-06-09 | Discharge: 2020-07-22 | DRG: 177 | Disposition: E | Payer: Medicare Other | Attending: Internal Medicine | Admitting: Internal Medicine

## 2020-06-09 DIAGNOSIS — K219 Gastro-esophageal reflux disease without esophagitis: Secondary | ICD-10-CM | POA: Diagnosis present

## 2020-06-09 DIAGNOSIS — Z8719 Personal history of other diseases of the digestive system: Secondary | ICD-10-CM

## 2020-06-09 DIAGNOSIS — Z79899 Other long term (current) drug therapy: Secondary | ICD-10-CM

## 2020-06-09 DIAGNOSIS — T380X5A Adverse effect of glucocorticoids and synthetic analogues, initial encounter: Secondary | ICD-10-CM | POA: Diagnosis not present

## 2020-06-09 DIAGNOSIS — Z79890 Hormone replacement therapy (postmenopausal): Secondary | ICD-10-CM

## 2020-06-09 DIAGNOSIS — E876 Hypokalemia: Secondary | ICD-10-CM | POA: Diagnosis present

## 2020-06-09 DIAGNOSIS — Z7952 Long term (current) use of systemic steroids: Secondary | ICD-10-CM | POA: Diagnosis not present

## 2020-06-09 DIAGNOSIS — E785 Hyperlipidemia, unspecified: Secondary | ICD-10-CM | POA: Diagnosis present

## 2020-06-09 DIAGNOSIS — C8218 Follicular lymphoma grade II, lymph nodes of multiple sites: Secondary | ICD-10-CM | POA: Diagnosis not present

## 2020-06-09 DIAGNOSIS — J069 Acute upper respiratory infection, unspecified: Secondary | ICD-10-CM

## 2020-06-09 DIAGNOSIS — E669 Obesity, unspecified: Secondary | ICD-10-CM | POA: Diagnosis present

## 2020-06-09 DIAGNOSIS — U071 COVID-19: Secondary | ICD-10-CM | POA: Diagnosis not present

## 2020-06-09 DIAGNOSIS — Z789 Other specified health status: Secondary | ICD-10-CM

## 2020-06-09 DIAGNOSIS — D849 Immunodeficiency, unspecified: Secondary | ICD-10-CM | POA: Diagnosis present

## 2020-06-09 DIAGNOSIS — I110 Hypertensive heart disease with heart failure: Secondary | ICD-10-CM | POA: Diagnosis present

## 2020-06-09 DIAGNOSIS — Z7189 Other specified counseling: Secondary | ICD-10-CM | POA: Diagnosis not present

## 2020-06-09 DIAGNOSIS — I5033 Acute on chronic diastolic (congestive) heart failure: Secondary | ICD-10-CM | POA: Diagnosis not present

## 2020-06-09 DIAGNOSIS — J9690 Respiratory failure, unspecified, unspecified whether with hypoxia or hypercapnia: Secondary | ICD-10-CM

## 2020-06-09 DIAGNOSIS — R161 Splenomegaly, not elsewhere classified: Secondary | ICD-10-CM | POA: Diagnosis present

## 2020-06-09 DIAGNOSIS — K5900 Constipation, unspecified: Secondary | ICD-10-CM | POA: Diagnosis not present

## 2020-06-09 DIAGNOSIS — J189 Pneumonia, unspecified organism: Secondary | ICD-10-CM

## 2020-06-09 DIAGNOSIS — D61818 Other pancytopenia: Secondary | ICD-10-CM | POA: Diagnosis present

## 2020-06-09 DIAGNOSIS — J9811 Atelectasis: Secondary | ICD-10-CM | POA: Diagnosis present

## 2020-06-09 DIAGNOSIS — J1282 Pneumonia due to coronavirus disease 2019: Secondary | ICD-10-CM | POA: Diagnosis not present

## 2020-06-09 DIAGNOSIS — Z66 Do not resuscitate: Secondary | ICD-10-CM | POA: Diagnosis not present

## 2020-06-09 DIAGNOSIS — R0603 Acute respiratory distress: Secondary | ICD-10-CM

## 2020-06-09 DIAGNOSIS — D509 Iron deficiency anemia, unspecified: Secondary | ICD-10-CM | POA: Diagnosis present

## 2020-06-09 DIAGNOSIS — Z801 Family history of malignant neoplasm of trachea, bronchus and lung: Secondary | ICD-10-CM

## 2020-06-09 DIAGNOSIS — E2740 Unspecified adrenocortical insufficiency: Secondary | ICD-10-CM | POA: Diagnosis present

## 2020-06-09 DIAGNOSIS — E871 Hypo-osmolality and hyponatremia: Secondary | ICD-10-CM | POA: Diagnosis not present

## 2020-06-09 DIAGNOSIS — Z96652 Presence of left artificial knee joint: Secondary | ICD-10-CM | POA: Diagnosis present

## 2020-06-09 DIAGNOSIS — F329 Major depressive disorder, single episode, unspecified: Secondary | ICD-10-CM | POA: Diagnosis present

## 2020-06-09 DIAGNOSIS — Z886 Allergy status to analgesic agent status: Secondary | ICD-10-CM | POA: Diagnosis not present

## 2020-06-09 DIAGNOSIS — E8881 Metabolic syndrome: Secondary | ICD-10-CM | POA: Diagnosis present

## 2020-06-09 DIAGNOSIS — R0600 Dyspnea, unspecified: Secondary | ICD-10-CM

## 2020-06-09 DIAGNOSIS — Z833 Family history of diabetes mellitus: Secondary | ICD-10-CM

## 2020-06-09 DIAGNOSIS — Z6837 Body mass index (BMI) 37.0-37.9, adult: Secondary | ICD-10-CM

## 2020-06-09 DIAGNOSIS — Z9981 Dependence on supplemental oxygen: Secondary | ICD-10-CM

## 2020-06-09 DIAGNOSIS — E039 Hypothyroidism, unspecified: Secondary | ICD-10-CM | POA: Diagnosis not present

## 2020-06-09 DIAGNOSIS — Z8249 Family history of ischemic heart disease and other diseases of the circulatory system: Secondary | ICD-10-CM

## 2020-06-09 DIAGNOSIS — J80 Acute respiratory distress syndrome: Secondary | ICD-10-CM | POA: Diagnosis not present

## 2020-06-09 DIAGNOSIS — R0609 Other forms of dyspnea: Secondary | ICD-10-CM | POA: Diagnosis not present

## 2020-06-09 DIAGNOSIS — Z515 Encounter for palliative care: Secondary | ICD-10-CM | POA: Diagnosis not present

## 2020-06-09 DIAGNOSIS — T501X5A Adverse effect of loop [high-ceiling] diuretics, initial encounter: Secondary | ICD-10-CM | POA: Diagnosis not present

## 2020-06-09 DIAGNOSIS — J9621 Acute and chronic respiratory failure with hypoxia: Secondary | ICD-10-CM | POA: Diagnosis not present

## 2020-06-09 DIAGNOSIS — I10 Essential (primary) hypertension: Secondary | ICD-10-CM | POA: Diagnosis not present

## 2020-06-09 DIAGNOSIS — E861 Hypovolemia: Secondary | ICD-10-CM | POA: Diagnosis present

## 2020-06-09 DIAGNOSIS — Z5320 Procedure and treatment not carried out because of patient's decision for unspecified reasons: Secondary | ICD-10-CM | POA: Diagnosis not present

## 2020-06-09 DIAGNOSIS — T502X5A Adverse effect of carbonic-anhydrase inhibitors, benzothiadiazides and other diuretics, initial encounter: Secondary | ICD-10-CM | POA: Diagnosis not present

## 2020-06-09 DIAGNOSIS — Z0184 Encounter for antibody response examination: Secondary | ICD-10-CM

## 2020-06-09 DIAGNOSIS — Z86010 Personal history of colonic polyps: Secondary | ICD-10-CM

## 2020-06-09 DIAGNOSIS — R4182 Altered mental status, unspecified: Secondary | ICD-10-CM | POA: Clinically undetermined

## 2020-06-09 DIAGNOSIS — R079 Chest pain, unspecified: Secondary | ICD-10-CM

## 2020-06-09 DIAGNOSIS — F419 Anxiety disorder, unspecified: Secondary | ICD-10-CM | POA: Diagnosis not present

## 2020-06-09 LAB — COMPREHENSIVE METABOLIC PANEL
ALT: 27 U/L (ref 0–44)
AST: 41 U/L (ref 15–41)
Albumin: 2.6 g/dL — ABNORMAL LOW (ref 3.5–5.0)
Alkaline Phosphatase: 45 U/L (ref 38–126)
Anion gap: 12 (ref 5–15)
BUN: 14 mg/dL (ref 8–23)
CO2: 24 mmol/L (ref 22–32)
Calcium: 7.8 mg/dL — ABNORMAL LOW (ref 8.9–10.3)
Chloride: 90 mmol/L — ABNORMAL LOW (ref 98–111)
Creatinine, Ser: 0.81 mg/dL (ref 0.44–1.00)
GFR calc Af Amer: >60 mL/min (ref >60)
GFR calc non Af Amer: >60 mL/min (ref >60)
Glucose, Bld: 122 mg/dL — ABNORMAL HIGH (ref 70–99)
Potassium: 3.4 mmol/L — ABNORMAL LOW (ref 3.5–5.1)
Sodium: 126 mmol/L — ABNORMAL LOW (ref 135–145)
Total Bilirubin: 0.9 mg/dL (ref 0.3–1.2)
Total Protein: 5.6 g/dL — ABNORMAL LOW (ref 6.5–8.1)

## 2020-06-09 LAB — BLOOD GAS, VENOUS
Acid-Base Excess: 4.2 mmol/L — ABNORMAL HIGH (ref 0.0–2.0)
Bicarbonate: 27.1 mmol/L (ref 20.0–28.0)
O2 Saturation: 93.3 %
Patient temperature: 98.6
pCO2, Ven: 35.1 mmHg — ABNORMAL LOW (ref 44.0–60.0)
pH, Ven: 7.5 — ABNORMAL HIGH (ref 7.250–7.430)
pO2, Ven: 67.7 mmHg — ABNORMAL HIGH (ref 32.0–45.0)

## 2020-06-09 LAB — CBC WITH DIFFERENTIAL/PLATELET
Abs Immature Granulocytes: 0.14 10*3/uL — ABNORMAL HIGH (ref 0.00–0.07)
Basophils Absolute: 0 10*3/uL (ref 0.0–0.1)
Basophils Relative: 0 %
Eosinophils Absolute: 0 10*3/uL (ref 0.0–0.5)
Eosinophils Relative: 0 %
HCT: 29.4 % — ABNORMAL LOW (ref 36.0–46.0)
Hemoglobin: 10.6 g/dL — ABNORMAL LOW (ref 12.0–15.0)
Immature Granulocytes: 2 %
Lymphocytes Relative: 6 %
Lymphs Abs: 0.5 10*3/uL — ABNORMAL LOW (ref 0.7–4.0)
MCH: 30.1 pg (ref 26.0–34.0)
MCHC: 36.1 g/dL — ABNORMAL HIGH (ref 30.0–36.0)
MCV: 83.5 fL (ref 80.0–100.0)
Monocytes Absolute: 0.5 10*3/uL (ref 0.1–1.0)
Monocytes Relative: 6 %
Neutro Abs: 6.7 10*3/uL (ref 1.7–7.7)
Neutrophils Relative %: 86 %
Platelets: 125 10*3/uL — ABNORMAL LOW (ref 150–400)
RBC: 3.52 MIL/uL — ABNORMAL LOW (ref 3.87–5.11)
RDW: 13.3 % (ref 11.5–15.5)
WBC: 7.8 10*3/uL (ref 4.0–10.5)
nRBC: 0 % (ref 0.0–0.2)

## 2020-06-09 LAB — URINALYSIS, ROUTINE W REFLEX MICROSCOPIC
Bilirubin Urine: NEGATIVE
Glucose, UA: NEGATIVE mg/dL
Hgb urine dipstick: NEGATIVE
Ketones, ur: NEGATIVE mg/dL
Leukocytes,Ua: NEGATIVE
Nitrite: NEGATIVE
Protein, ur: NEGATIVE mg/dL
Specific Gravity, Urine: 1.015 (ref 1.005–1.030)
pH: 6 (ref 5.0–8.0)

## 2020-06-09 LAB — PROTIME-INR
INR: 1.1 (ref 0.8–1.2)
Prothrombin Time: 14.1 seconds (ref 11.4–15.2)

## 2020-06-09 LAB — LACTIC ACID, PLASMA: Lactic Acid, Venous: 0.8 mmol/L (ref 0.5–1.9)

## 2020-06-09 LAB — APTT: aPTT: 33 seconds (ref 24–36)

## 2020-06-09 MED ORDER — ENOXAPARIN SODIUM 40 MG/0.4ML ~~LOC~~ SOLN
40.0000 mg | Freq: Every day | SUBCUTANEOUS | Status: DC
  Administered 2020-06-09 – 2020-06-24 (×16): 40 mg via SUBCUTANEOUS
  Filled 2020-06-09 (×16): qty 0.4

## 2020-06-09 MED ORDER — DEXAMETHASONE SODIUM PHOSPHATE 10 MG/ML IJ SOLN
6.0000 mg | Freq: Every day | INTRAMUSCULAR | Status: DC
  Administered 2020-06-09: 6 mg via INTRAVENOUS
  Filled 2020-06-09: qty 1

## 2020-06-09 MED ORDER — SODIUM CHLORIDE 0.9 % IV SOLN
2.0000 g | Freq: Once | INTRAVENOUS | Status: AC
  Administered 2020-06-09: 2 g via INTRAVENOUS
  Filled 2020-06-09: qty 2

## 2020-06-09 MED ORDER — SODIUM CHLORIDE 0.9 % IV SOLN: Freq: Once | INTRAVENOUS | Status: AC

## 2020-06-09 MED ORDER — ACETAMINOPHEN 325 MG PO TABS
650.0000 mg | ORAL_TABLET | Freq: Once | ORAL | Status: AC
  Administered 2020-06-09: 650 mg via ORAL
  Filled 2020-06-09: qty 2

## 2020-06-09 MED ORDER — SODIUM CHLORIDE 0.9 % IV BOLUS
1000.0000 mL | Freq: Once | INTRAVENOUS | Status: AC
  Administered 2020-06-09: 1000 mL via INTRAVENOUS

## 2020-06-09 MED ORDER — VANCOMYCIN HCL 2000 MG/400ML IV SOLN
2000.0000 mg | Freq: Once | INTRAVENOUS | Status: AC
  Administered 2020-06-09: 2000 mg via INTRAVENOUS
  Filled 2020-06-09: qty 400

## 2020-06-09 NOTE — ED Triage Notes (Signed)
Pt BIBA from home-  Per EMS- Pt recently dx with COVID 19 05/28/20; hospitalized and discharged 06/01/20.  Pt family reports pt with generalized weakness, confusion x2 days. Denies pain, denies fever. Was d/c on 2L [place] O2, arrives to ED on 3L O2.

## 2020-06-09 NOTE — ED Notes (Signed)
Pt place on purewick, RN aware.

## 2020-06-09 NOTE — H&P (Signed)
History and Physical    Kristina Ramos HDQ:[id number] DOB: 1941/05/14 DOA: 06/08/2020  PCP: Ann Held, DO  Patient coming from: Home  I have personally briefly reviewed patient's old medical records in [place]  Chief Complaint: Weakness  HPI: Kristina Ramos is a 79 y.o. female with medical history significant for follicular lymphoma, hypertension, hypothyroidism, iron deficiency anemia, recent Covid pneumonia who presents with worsening weakness.  She was recently hospitalized from 6/7-6/11 for acute hypoxic respiratory failure secondary to COVID pneumonia.  Patient reportedly had already received her COVID vaccine.  She received 4 dose of remdesivir and was sent home on 10 more days of steroids and 2 L of oxygen.  States she finished her steroids yesterday.  She presents today because her family thinks she has gotten worse.  She denies any worsening shortness of breath although son reportedly said that her O2 saturation was 85% on 2 L and her breathing appear shallow and rapid.  She is now requiring 4 L in the ED.  She says that about 2 days ago she slid out of her bed and could not get back up due to weakness and fatigue.  She reports  good appetite but has been having decrease PO intake.  Denies any nausea, vomiting or diarrhea.  No abdominal pain.  In the ED, she was febrile up to 101.1 require up to 4 L and appeared lethargic.  No leukocytosis, mild anemia of 10.6, platelets 125.  Sodium 126, potassium of 3.4, glucose of 122, normal creatinine at 0.81.  CT head negative.  Mild bilateral infiltrate, very mildly increased in severity compared to prior study.     Review of Systems:  Constitutional: No Weight Change, No Fever ENT/Mouth: No sore throat, No Rhinorrhea Eyes: No Eye Pain, No Vision Changes Cardiovascular: No Chest Pain, no SOB Respiratory: No Cough, No Sputum, No Wheezing, no Dyspnea  Gastrointestinal: No Nausea, No Vomiting, No Diarrhea, No  Constipation, No Pain Genitourinary: no Urinary Incontinence Musculoskeletal: No Arthralgias, No Myalgias Skin: No Skin Lesions, No Pruritus, Neuro: + Weakness, No Numbness,  No Loss of Consciousness, No Syncope Psych: No Anxiety/Panic, No Depression, no decrease appetite Heme/Lymph: No Bruising, No Bleeding   Past Medical History:  Diagnosis Date  . Anemia   . Arthritis   . Depression   . Follicular lymphoma grade ii, lymph nodes of multiple sites ([place]) 09/30/2018  . Follicular lymphoma grade ii, lymph nodes of multiple sites ([place]) 09/30/2018  . GERD (gastroesophageal reflux disease)   . Goals of care, counseling/discussion 09/30/2018  . Heart murmur   . Hx of adenomatous colonic polyps 07/28/2017  . Hyperlipidemia   . Hypertension   . 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 Thyroid disease     Past Surgical History:  Procedure Laterality Date  . TONSILLECTOMY    . TOTAL KNEE ARTHROPLASTY Left 04/25/2014  . TUBAL LIGATION       reports that she has never smoked. She has never used smokeless tobacco. She reports that she does not drink alcohol and does not use drugs.  Allergies  Allergen Reactions  . Aspirin Other (See Comments)    REACTION: pt states that it irritates her stomach, coated ASA is ok    Family History  Problem Relation Age of Onset  . Diabetes Maternal Grandmother   . Diabetes Paternal Aunt   . Lung cancer Father        smoker  . Heart disease Father   . CAD  Mother   . Cataracts Mother   . Thyroid disease Maternal Aunt   . Colon cancer Neg Hx   . Esophageal cancer Neg Hx   . Pancreatic cancer Neg Hx   . Rectal cancer Neg Hx   . Stomach cancer Neg Hx      Prior to Admission medications   Medication Sig Start Date End Date Taking? Authorizing Provider  amoxicillin (AMOXIL) 500 MG capsule Take 2,000 mg by mouth as needed. only for dental procedeure    [provider]  Cholecalciferol (VITAMIN D) 2000 units CAPS Take 1  capsule by mouth daily.    [provider]  dexamethasone (DECADRON) 6 MG tablet Take 1 tablet (6 mg total) by mouth daily. 06/01/20   Patrecia Pour, MD  famciclovir (FAMVIR) 250 MG tablet TAKE 1 TABLET BY MOUTH EVERY DAY Patient taking differently: Take 250 mg by mouth daily.  09/08/19   Volanda Napoleon, MD  hydrochlorothiazide (HYDRODIURIL) 12.5 MG tablet TAKE 1 TABLET BY MOUTH EVERY DAY 01/11/20   Carollee Herter, Alferd Apa, DO  levothyroxine (SYNTHROID) 75 MCG tablet Take 1 tablet (75 mcg total) by mouth daily. 01/25/20   Roma Schanz R, DO  lisinopril (ZESTRIL) 10 MG tablet TAKE 1 TABLET BY MOUTH EVERY DAY 01/11/20   Carollee Herter, Alferd Apa, DO  loperamide (IMODIUM) 2 MG capsule Take 1 capsule (2 mg total) by mouth as needed for diarrhea or loose stools. 06/01/20   Patrecia Pour, MD    Physical Exam: Vitals:   05/29/2020 1804 06/05/2020 1807 05/31/2020 1809 06/11/2020 2030  BP: 121/65   112/63  Pulse: 89   72  Resp: 20   (!) 21  Temp:   (!) 101.1 F (38.4 C)   TempSrc:   Oral   SpO2: 94%   98%  Weight:  99.8 kg    Height:  5\' 4"  (1.626 m)      Constitutional: NAD, calm, comfortable, lethargic appearing obese female sitting at 40 degree incline Vitals:   06/10/2020 1804 06/13/2020 1807 05/23/2020 1809 06/10/2020 2030  BP: 121/65   112/63  Pulse: 89   72  Resp: 20   (!) 21  Temp:   (!) 101.1 F (38.4 C)   TempSrc:   Oral   SpO2: 94%   98%  Weight:  99.8 kg    Height:  5\' 4"  (1.626 m)     Eyes: PERRL, lids and conjunctivae normal ENMT: Mucous membranes are moist Neck: normal, supple,  Respiratory: Bibasilar crackles but no wheezing on 4L [place]. Normal respiratory effort. No accessory muscle use.  Cardiovascular: Regular rate and rhythm, no murmurs / rubs / gallops. Mild non-pitting edema of bilateral lower extremity.    Abdomen: no tenderness, no masses palpated.  Bowel sounds positive.  Musculoskeletal: no clubbing / cyanosis. No joint deformity upper and lower extremities. Good ROM, no  contractures. Normal muscle tone.  Skin: no rashes, lesions, ulcers. No induration Neurologic: CN 2-12 grossly intact. Sensation intact. Strength 5/5 in all 4.  Psychiatric: Normal judgment and insight. Alert and oriented x 3 although sometimes appear confused and stares off into distance. Normal mood.    Labs on Admission: I have personally reviewed following labs and imaging studies  CBC: Recent Labs  Lab 06/17/2020 1834  WBC 7.8  NEUTROABS 6.7  HGB 10.6*  HCT 29.4*  MCV 83.5  PLT 419*   Basic Metabolic Panel: Recent Labs  Lab 06/18/2020 1834  NA 126*  K 3.4*  CL 90*  CO2 24  GLUCOSE 122*  BUN 14  CREATININE 0.81  CALCIUM 7.8*   GFR: Estimated Creatinine Clearance: 65.7 mL/min (by C-G formula based on SCr of 0.81 mg/dL). Liver Function Tests: Recent Labs  Lab 06/12/2020 1834  AST 41  ALT 27  ALKPHOS 45  BILITOT 0.9  PROT 5.6*  ALBUMIN 2.6*   No results for input(s): LIPASE, AMYLASE in the last 168 hours. No results for input(s): AMMONIA in the last 168 hours. Coagulation Profile: Recent Labs  Lab 06/03/2020 1834  INR 1.1   Cardiac Enzymes: No results for input(s): CKTOTAL, CKMB, CKMBINDEX, TROPONINI in the last 168 hours. BNP (last 3 results) No results for input(s): PROBNP in the last 8760 hours. HbA1C: No results for input(s): HGBA1C in the last 72 hours. CBG: No results for input(s): GLUCAP in the last 168 hours. Lipid Profile: No results for input(s): CHOL, HDL, LDLCALC, TRIG, CHOLHDL, LDLDIRECT in the last 72 hours. Thyroid Function Tests: No results for input(s): TSH, T4TOTAL, FREET4, T3FREE, THYROIDAB in the last 72 hours. Anemia Panel: No results for input(s): VITAMINB12, FOLATE, FERRITIN, TIBC, IRON, RETICCTPCT in the last 72 hours. Urine analysis:    Component Value Date/Time   COLORURINE YELLOW 06/18/2020 1812   APPEARANCEUR CLEAR 06/06/2020 1812   LABSPEC 1.015 06/03/2020 1812   PHURINE 6.0 06/20/2020 1812   GLUCOSEU NEGATIVE 06/01/2020  1812   HGBUR NEGATIVE 05/30/2020 1812   BILIRUBINUR NEGATIVE 06/10/2020 1812   KETONESUR NEGATIVE 06/19/2020 1812   PROTEINUR NEGATIVE 06/12/2020 1812   NITRITE NEGATIVE 06/01/2020 1812   LEUKOCYTESUR NEGATIVE 06/12/2020 1812    Radiological Exams on Admission: CT Head Wo Contrast  Result Date: 06/20/2020 CLINICAL DATA:  Altered mental status EXAM: CT HEAD WITHOUT CONTRAST TECHNIQUE: Contiguous axial images were obtained from the base of the skull through the vertex without intravenous contrast. COMPARISON:  CT of the paranasal sinuses dated 01/22/2010 FINDINGS: Brain: No evidence of acute infarction, hemorrhage, hydrocephalus, extra-axial collection or mass lesion/mass effect. There is mild cerebral volume loss with associated ex vacuo dilatation. Periventricular white matter hypoattenuation likely represents chronic small vessel ischemic disease. Vascular: There are vascular calcifications in the carotid siphons. Skull: Normal. Negative for fracture or focal lesion. Sinuses/Orbits: Moderate sinus disease is seen in the right frontal, right ethmoid, right sphenoid, and bilateral maxillary sinuses. Other: None. IMPRESSION: 1. No acute intracranial process. Electronically Signed   By: Zerita Boers M.D.   On: 06/04/2020 19:29   DG Chest Port 1 View  Result Date: 06/11/2020 CLINICAL DATA:  Fever. EXAM: PORTABLE CHEST 1 VIEW COMPARISON:  May 28, 2020 FINDINGS: Mild, stable diffusely increased lung markings are seen. Mild infiltrates are noted within the mid left lung and left lung base. This is very mildly increased in severity when compared to the prior study. Mild stable infiltrates are seen along the periphery of the right lung. There is no evidence of a pleural effusion or pneumothorax. The heart size and mediastinal contours are within normal limits. The visualized skeletal structures are unremarkable. IMPRESSION: Mild bilateral infiltrates, very mildly increased in severity when compared to the  prior study. Electronically Signed   By: Virgina Norfolk M.D.   On: 05/23/2020 19:09      Assessment/Plan  Acute on chronic hypoxemic respiratory failure secondary to worsening COVID pneumonia Patient recently discharged on 6/11 on 2 L for COVID pneumonia and just finished her steroids yesterday.  Patient likely will need prolonged course of steroids. Will resume IV Decadron Will add on  IV vancomycin and cefepime for possible superimposed bacterial pneumonia.  Will check PCT. Check CPR and d-dimer although low suspicion for PE.  Incentive spirometry and flutter valve  Hyponatremia Likely due to hypovolemia.  Continue to follow with fluids.  Follicular lymphoma with leukopenia, iron deficiency anemia Follows Dr. Marin Olp outpatient oncology clinic   HTN continue home meds  Hypothyroidism Continue levothyroxine  DVT prophylaxis:.Lovenox Code Status: Full Family Communication: Plan discussed with son over the phone. Son would like daily updates.  disposition Plan: Home with at least 2 midnight stays  Consults called:  Admission status: inpatient  Status is: Inpatient  Remains inpatient appropriate because:IV treatments appropriate due to intensity of illness or inability to take PO   Dispo: The patient is from: Home              Anticipated d/c is to: Home              Anticipated d/c date is: > 3 days              Patient currently is not medically stable to d/c.         Orene Desanctis DO [facility]   If 7PM-7AM, please contact night-coverage [email or web address]   06/02/2020, 10:10 PM

## 2020-06-09 NOTE — ED Provider Notes (Signed)
[facility] DEPT Provider Note   CSN: [id number] Arrival date & time: 06/10/2020  1744     History Chief Complaint  Patient presents with  . Weakness  . Altered Mental Status    Kristina Ramos is a 79 y.o. female.  HPI 79 year old female presents with generalized weakness.  Comes in by EMS.  The report from EMS was family was reporting weakness/fatigue as well as confusion over the last couple days.  The patient states she has felt fatigued ever since her Covid diagnosis on 6/7.  Was discharged from the hospital about 1 week ago and has been on 2 L oxygen since.  She has continued to have a cough though it is not worse.  She denies any other complaints besides fatigue including no headache, shortness of breath, chest pain, vomiting, diarrhea or abdominal pain.  Further history was obtained from the son over the phone.  The patient has been having fatigue ever since Covid but has seemed to be worse and is having some mild falls where she is lowering herself to the ground since yesterday.  Seems intermittently confused.  Unknown whether or not she has having a fever.  To the son it feels like the cough is worse.  Her O2 sats were in the mid 80s despite being on her home oxygen. She has had some diarrhea recently and poor PO intake.   Past Medical History:  Diagnosis Date  . Anemia   . Arthritis   . Depression   . Follicular lymphoma grade ii, lymph nodes of multiple sites ([place]) 09/30/2018  . Follicular lymphoma grade ii, lymph nodes of multiple sites ([place]) 09/30/2018  . GERD (gastroesophageal reflux disease)   . Goals of care, counseling/discussion 09/30/2018  . Heart murmur   . Hx of adenomatous colonic polyps 07/28/2017  . Hyperlipidemia   . Hypertension   . 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 Thyroid disease     Patient Active Problem List   Diagnosis Date Noted  . Pneumonia due to COVID-19 virus 05/28/2020  . Acute  respiratory failure with hypoxia ([place]) 05/28/2020  . Hyponatremia 05/28/2020  . Low vitamin B12 level 10/05/2018  . 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 Goals of care, counseling/discussion 09/30/2018  . Follicular lymphoma grade ii, lymph nodes of multiple sites ([place]) 09/30/2018  . Iron deficiency anemia 03/18/2018  . Iron deficiency anemia due to chronic blood loss 02/12/2018  . Hx of adenomatous colonic polyps 07/28/2017  . Fungal dermatitis 05/09/2015  . Rib pain on left side 01/02/2015  . Obesity (BMI 30-39.9) 12/27/2013  . OA (osteoarthritis) of knee 12/27/2013  . 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 HEADACHE 01/21/2010  . LACERATION OF FINGER 03/19/2009  . ARTHROSCOPY, LEFT KNEE, HX OF 03/23/2008  . HYPERLIPIDEMIA 12/14/2007  . DYSMETABOLIC SYNDROME X 96/78/9381  . Hypothyroidism 11/26/2007  . DEPRESSION 11/26/2007  . Essential hypertension, benign 11/26/2007  . TONSILLECTOMY AND ADENOIDECTOMY, HX OF 11/26/2007    Past Surgical History:  Procedure Laterality Date  . TONSILLECTOMY    . TOTAL KNEE ARTHROPLASTY Left 04/25/2014  . TUBAL LIGATION       OB History   No obstetric history on file.     Family History  Problem Relation Age of Onset  . Diabetes Maternal Grandmother   . Diabetes Paternal Aunt   . Lung cancer Father        smoker  . Heart disease Father   . CAD Mother   . Cataracts Mother   .  Thyroid disease Maternal Aunt   . Colon cancer Neg Hx   . Esophageal cancer Neg Hx   . Pancreatic cancer Neg Hx   . Rectal cancer Neg Hx   . Stomach cancer Neg Hx     Social History   Tobacco Use  . Smoking status: Never Smoker  . Smokeless tobacco: Never Used  Vaping Use  . Vaping Use: Never used  Substance Use Topics  . Alcohol use: No  . Drug use: No    Home Medications Prior to Admission medications   Medication Sig Start Date End Date Taking? Authorizing Provider  amoxicillin (AMOXIL) 500 MG capsule Take 2,000 mg by mouth as  needed. only for dental procedeure    [provider]  Cholecalciferol (VITAMIN D) 2000 units CAPS Take 1 capsule by mouth daily.    [provider]  dexamethasone (DECADRON) 6 MG tablet Take 1 tablet (6 mg total) by mouth daily. 06/01/20   Patrecia Pour, MD  famciclovir (FAMVIR) 250 MG tablet TAKE 1 TABLET BY MOUTH EVERY DAY Patient taking differently: Take 250 mg by mouth daily.  09/08/19   Volanda Napoleon, MD  hydrochlorothiazide (HYDRODIURIL) 12.5 MG tablet TAKE 1 TABLET BY MOUTH EVERY DAY 01/11/20   Carollee Herter, Alferd Apa, DO  levothyroxine (SYNTHROID) 75 MCG tablet Take 1 tablet (75 mcg total) by mouth daily. 01/25/20   Roma Schanz R, DO  lisinopril (ZESTRIL) 10 MG tablet TAKE 1 TABLET BY MOUTH EVERY DAY 01/11/20   Carollee Herter, Alferd Apa, DO  loperamide (IMODIUM) 2 MG capsule Take 1 capsule (2 mg total) by mouth as needed for diarrhea or loose stools. 06/01/20   Patrecia Pour, MD    Allergies    Aspirin  Review of Systems   Review of Systems  Constitutional: Positive for fatigue. Negative for fever.  Respiratory: Positive for cough. Negative for shortness of breath.   Cardiovascular: Negative for chest pain.  Gastrointestinal: Negative for abdominal pain, diarrhea and vomiting.  Neurological: Positive for weakness. Negative for headaches.  All other systems reviewed and are negative.   Physical Exam Updated Vital Signs BP 112/63   Pulse 72   Temp (!) 101.1 F (38.4 C) (Oral)   Resp (!) 21   Ht 5\' 4"  (1.626 m)   Wt 99.8 kg   SpO2 98%   BMI 37.76 kg/m   Physical Exam Vitals and nursing note reviewed.  Constitutional:      General: She is not in acute distress.    Appearance: She is well-developed. She is obese. She is not ill-appearing or diaphoretic.  HENT:     Head: Normocephalic and atraumatic.     Right Ear: External ear normal.     Left Ear: External ear normal.     Nose: Nose normal.  Eyes:     General:        Right eye: No discharge.         Left eye: No discharge.     Extraocular Movements: Extraocular movements intact.     Pupils: Pupils are equal, round, and reactive to light.  Cardiovascular:     Rate and Rhythm: Normal rate and regular rhythm.     Heart sounds: Normal heart sounds.  Pulmonary:     Effort: Pulmonary effort is normal.     Breath sounds: Examination of the right-lower field reveals rales. Examination of the left-lower field reveals rales. Rales present.  Abdominal:     Palpations: Abdomen is soft.  Tenderness: There is no abdominal tenderness.  Skin:    General: Skin is warm and dry.  Neurological:     Mental Status: She is alert and oriented to person, place, and time.     Comments: CN 3-12 grossly intact. 5/5 strength in all 4 extremities. Grossly normal sensation. Normal finger to nose.   Psychiatric:        Mood and Affect: Mood is not anxious.     ED Results / Procedures / Treatments   Labs (all labs ordered are listed, but only abnormal results are displayed) Labs Reviewed  COMPREHENSIVE METABOLIC PANEL - Abnormal; Notable for the following components:      Result Value   Sodium 126 (*)    Potassium 3.4 (*)    Chloride 90 (*)    Glucose, Bld 122 (*)    Calcium 7.8 (*)    Total Protein 5.6 (*)    Albumin 2.6 (*)    All other components within normal limits  CBC WITH DIFFERENTIAL/PLATELET - Abnormal; Notable for the following components:   RBC 3.52 (*)    Hemoglobin 10.6 (*)    HCT 29.4 (*)    MCHC 36.1 (*)    Platelets 125 (*)    Lymphs Abs 0.5 (*)    Abs Immature Granulocytes 0.14 (*)    All other components within normal limits  BLOOD GAS, VENOUS - Abnormal; Notable for the following components:   pH, Ven 7.500 (*)    pCO2, Ven 35.1 (*)    pO2, Ven 67.7 (*)    Acid-Base Excess 4.2 (*)    All other components within normal limits  CULTURE, BLOOD (ROUTINE X 2)  CULTURE, BLOOD (ROUTINE X 2)  URINE CULTURE  LACTIC ACID, PLASMA  APTT  PROTIME-INR  URINALYSIS, ROUTINE W  REFLEX MICROSCOPIC    EKG EKG Interpretation  Date/Time:  Saturday June 09 2020 18:14:45 EDT Ventricular Rate:  86 PR Interval:    QRS Duration: 71 QT Interval:  342 QTC Calculation: 409 R Axis:   -14 Text Interpretation: Sinus rhythm no acute ST/T changes similar to May 28 2020 Confirmed by Sherwood Gambler [phone]) on 06/07/2020 6:18:50 PM   Radiology CT Head Wo Contrast  Result Date: 06/11/2020 CLINICAL DATA:  Altered mental status EXAM: CT HEAD WITHOUT CONTRAST TECHNIQUE: Contiguous axial images were obtained from the base of the skull through the vertex without intravenous contrast. COMPARISON:  CT of the paranasal sinuses dated 01/22/2010 FINDINGS: Brain: No evidence of acute infarction, hemorrhage, hydrocephalus, extra-axial collection or mass lesion/mass effect. There is mild cerebral volume loss with associated ex vacuo dilatation. Periventricular white matter hypoattenuation likely represents chronic small vessel ischemic disease. Vascular: There are vascular calcifications in the carotid siphons. Skull: Normal. Negative for fracture or focal lesion. Sinuses/Orbits: Moderate sinus disease is seen in the right frontal, right ethmoid, right sphenoid, and bilateral maxillary sinuses. Other: None. IMPRESSION: 1. No acute intracranial process. Electronically Signed   By: Zerita Boers M.D.   On: 06/12/2020 19:29   DG Chest Port 1 View  Result Date: 05/27/2020 CLINICAL DATA:  Fever. EXAM: PORTABLE CHEST 1 VIEW COMPARISON:  May 28, 2020 FINDINGS: Mild, stable diffusely increased lung markings are seen. Mild infiltrates are noted within the mid left lung and left lung base. This is very mildly increased in severity when compared to the prior study. Mild stable infiltrates are seen along the periphery of the right lung. There is no evidence of a pleural effusion or pneumothorax. The heart size  and mediastinal contours are within normal limits. The visualized skeletal structures are unremarkable.  IMPRESSION: Mild bilateral infiltrates, very mildly increased in severity when compared to the prior study. Electronically Signed   By: Virgina Norfolk M.D.   On: 06/18/2020 19:09    Procedures Procedures (including critical care time)  Medications Ordered in ED Medications  0.9 %  sodium chloride infusion (has no administration in time range)  acetaminophen (TYLENOL) tablet 650 mg (650 mg Oral Given 05/23/2020 1830)  sodium chloride 0.9 % bolus 1,000 mL (0 mLs Intravenous Stopped 05/31/2020 2055)    ED Course  I have reviewed the triage vital signs and the nursing notes.  Pertinent labs & imaging results that were available during my care of the patient were reviewed by me and considered in my medical decision making (see chart for details).    MDM Rules/Calculators/A&P                          Patient is not altered here though the son does report some mild confusion at home.  She is found to have recurrent hyponatremia which is probably due to decreased p.o. intake.  She is also found to be febrile without an obvious source.  Given the son feels the cough is a little worse and she has had to go up on her O2 requirement, will cover for hospital-acquired pneumonia.  Otherwise urine is clear.  I have personally reviewed the CT head and chest x-ray as well as the labs.  Lactate is normal.  Vitals are otherwise stable besides the fever.  Unclear if this is a continuation of the Covid infection versus now a bacterial illness.  I have updated the son over the phone for the work-up and treatment.  She will be admitted to the hospitalist service.  Dr. Flossie Buffy to admit.  Takeysha Bonk Loren was evaluated in Emergency Department on 06/13/2020 for the symptoms described in the history of present illness. She was evaluated in the context of the global COVID-19 pandemic, which necessitated consideration that the patient might be at risk for infection with the SARS-CoV-2 virus that causes COVID-19. Institutional  protocols and algorithms that pertain to the evaluation of patients at risk for COVID-19 are in a state of rapid change based on information released by regulatory bodies including the CDC and federal and state organizations. These policies and algorithms were followed during the patient's care in the ED.  Final Clinical Impression(s) / ED Diagnoses Final diagnoses:  HCAP (healthcare-associated pneumonia)  Hyponatremia    Rx / DC Orders ED Discharge Orders    None       Sherwood Gambler, MD 05/22/2020 2145

## 2020-06-10 ENCOUNTER — Encounter (HOSPITAL_COMMUNITY): Payer: Self-pay | Admitting: Family Medicine

## 2020-06-10 DIAGNOSIS — E876 Hypokalemia: Secondary | ICD-10-CM

## 2020-06-10 LAB — PROCALCITONIN: Procalcitonin: <0.1 ng/mL

## 2020-06-10 LAB — C-REACTIVE PROTEIN: CRP: 13.9 mg/dL — ABNORMAL HIGH (ref <1.0)

## 2020-06-10 LAB — D-DIMER, QUANTITATIVE: D-Dimer, Quant: 2.41 ug/mL-FEU — ABNORMAL HIGH (ref 0.00–0.50)

## 2020-06-10 MED ORDER — VANCOMYCIN HCL 1250 MG/250ML IV SOLN: 1250.0000 mg | INTRAVENOUS | Status: DC

## 2020-06-10 MED ORDER — SODIUM CHLORIDE 0.9 % IV SOLN
2.0000 g | Freq: Three times a day (TID) | INTRAVENOUS | Status: DC
  Administered 2020-06-10: 2 g via INTRAVENOUS
  Filled 2020-06-10: qty 2

## 2020-06-10 MED ORDER — POTASSIUM CHLORIDE CRYS ER 20 MEQ PO TBCR
40.0000 meq | EXTENDED_RELEASE_TABLET | Freq: Two times a day (BID) | ORAL | Status: AC
  Administered 2020-06-10 – 2020-06-12 (×5): 40 meq via ORAL
  Filled 2020-06-10 (×5): qty 2

## 2020-06-10 MED ORDER — GUAIFENESIN 100 MG/5ML PO SOLN
5.0000 mL | ORAL | Status: DC | PRN
  Administered 2020-06-10 – 2020-06-19 (×13): 100 mg via ORAL
  Filled 2020-06-10 (×13): qty 10

## 2020-06-10 MED ORDER — POTASSIUM CHLORIDE IN NACL 20-0.9 MEQ/L-% IV SOLN
INTRAVENOUS | Status: DC
  Filled 2020-06-10 (×3): qty 1000

## 2020-06-10 MED ORDER — LISINOPRIL 10 MG PO TABS
10.0000 mg | ORAL_TABLET | Freq: Every day | ORAL | Status: DC
  Administered 2020-06-10 – 2020-06-13 (×4): 10 mg via ORAL
  Filled 2020-06-10 (×4): qty 1

## 2020-06-10 MED ORDER — HYDROCHLOROTHIAZIDE 12.5 MG PO CAPS: 12.5000 mg | ORAL_CAPSULE | Freq: Every day | ORAL | Status: DC

## 2020-06-10 MED ORDER — HYDROCOD POLST-CPM POLST ER 10-8 MG/5ML PO SUER
5.0000 mL | Freq: Two times a day (BID) | ORAL | Status: DC | PRN
  Administered 2020-06-10 – 2020-06-17 (×11): 5 mL via ORAL
  Filled 2020-06-10 (×11): qty 5

## 2020-06-10 MED ORDER — SENNA 8.6 MG PO TABS
1.0000 | ORAL_TABLET | Freq: Every evening | ORAL | Status: DC | PRN
  Administered 2020-06-10 – 2020-06-21 (×8): 8.6 mg via ORAL
  Filled 2020-06-10 (×8): qty 1

## 2020-06-10 MED ORDER — LEVOTHYROXINE SODIUM 75 MCG PO TABS
75.0000 ug | ORAL_TABLET | Freq: Every day | ORAL | Status: DC
  Administered 2020-06-10 – 2020-06-25 (×16): 75 ug via ORAL
  Filled 2020-06-10 (×16): qty 1

## 2020-06-10 MED ORDER — VALACYCLOVIR HCL 500 MG PO TABS: 1000.0000 mg | ORAL_TABLET | Freq: Every day | ORAL | Status: DC

## 2020-06-10 NOTE — Progress Notes (Signed)
Pharmacy Antibiotic Note  Kristina Ramos is a 79 y.o. female admitted on 05/27/2020 with pneumonia.  Pharmacy has been consulted for Vancomycin, cefepime dosing.  Plan: Vancomycin 2gm iv x1, then Vancomycin 1250 mg IV Q 24hrs. Goal AUC 400-550. Expected AUC: 551 SCr used: 0.81  Cefepime 2gm iv x1, then 2gm iv q8hr   Height: 5\' 4"  (162.6 cm) Weight: 99.8 kg (220 lb) IBW/kg (Calculated) : 54.7  Temp (24hrs), Avg:99.4 F (37.4 C), Min:97.7 F (36.5 C), Max:101.1 F (38.4 C)  Recent Labs  Lab 05/30/2020 1833 06/16/2020 1834  WBC  --  7.8  CREATININE  --  0.81  LATICACIDVEN 0.8  --     Estimated Creatinine Clearance: 65.7 mL/min (by C-G formula based on SCr of 0.81 mg/dL).    Allergies  Allergen Reactions  . Aspirin Other (See Comments)    REACTION: pt states that it irritates her stomach, coated ASA is ok    Antimicrobials this admission: Vancomycin 06/01/2020 >> Cefepime 06/08/2020 >>   Dose adjustments this admission: -  Microbiology results: -  Thank you for allowing pharmacy to be a part of this patient's care.  Kristina Ramos 06/10/2020 5:29 AM

## 2020-06-10 NOTE — Progress Notes (Signed)
Kristina Ramos  YSA:[id number] DOB: [birth year] DOA: 06/08/2020 PCP: Ann Held, DO    Brief Narrative:  79 year old with a history of follicular lymphoma, HTN, hypothyroidism, iron deficiency, and a hospitalization for Covid pneumonia 6/7 > 6/11.  She completed a course of remdesivir and was sent home on steroids and 2 L of nasal cannula support.  Family brought her back to the ED 6/19 because they felt she was more short of breath with increasing O2 requirements.  In the ED she was febrile to 101.1 and required 4 L oxygen support to keep her saturations within range.  She appeared lethargic.  She was found to have a sodium of 126.  CT scan of the head was unrevealing.  CXR noted only mild bilateral infiltrates stable versus very mildly increased since prior x-rays.  Significant Events: 6/7 POSITIVE SARS-CoV2 test 6/7 > 6/11 admit for Covid pneumonia 6/19 readmit via [facility] for same  Date of Positive COVID Test: 05/28/20  COVID-19 specific Treatment: Remdesivir 6/7 > 6/11 Decadron 6/7 > 6/16  Antimicrobials:  Cefepime 6/9 Vancomycin 7/19   Subjective: Patient is sitting up in a bedside chair and appears comfortable.  She is alert oriented and conversant.  She is highly motivated to go home as soon as possible.  She says she feels much better after IV fluid.  She denies chest pain abdominal pain nausea vomiting or diarrhea.  Assessment & Plan:  Persistent acute hypoxic respiratory failure - COVID Pneumonia Oxygen saturations are presently stable on 3 L nasal cannula only -I suspect her hypoxia at admission may have been driven by lethargy/diminished respiratory drive related to dehydration -chest x-ray is not worrisome for worsening infiltrates -I see no evidence of a bacterial pneumonia -we will follow-up chest x-ray in the morning but for now I have stopped her antibiotic -I do not feel that another course of Decadron is indicated presently  Recent Labs  Lab  05/25/2020 1834 06/10/20 0504  DDIMER  --  2.41*  CRP  --  13.9*  ALT 27  --   PROCALCITON  --  <0.10    Hyponatremia Appears to be hypovolemic in etiology - gently hydrate - f/u Na in AM   Hypokalemia Supplement and follow  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   DVT prophylaxis: Lovenox Code Status: FULL CODE Family Communication:  Status is: Inpatient  Remains inpatient appropriate because:Inpatient level of care appropriate due to severity of illness   Dispo: The patient is from: Home              Anticipated d/c is to: Home              Anticipated d/c date is: 2 days              Patient currently is not medically stable to d/c.   Consultants:  none  Objective: Blood pressure (!) 119/57, pulse 83, temperature 98 F (36.7 C), temperature source Oral, resp. rate 18, height 5\' 4"  (1.626 m), weight 99.8 kg, SpO2 94 %.  Intake/Output Summary (Last 24 hours) at 06/10/2020 1607 Last data filed at 06/10/2020 0830 Gross per 24 hour  Intake 1645.6 ml  Output --  Net 1645.6 ml   Filed Weights   05/23/2020 1807  Weight: 99.8 kg    Examination: General: No acute respiratory distress Lungs: Clear to auscultation bilaterally without wheezes or crackles Cardiovascular: Regular rate and rhythm without murmur gallop or rub normal S1 and S2 Abdomen: Nontender, nondistended, soft, bowel sounds positive, no rebound, no ascites, no appreciable mass Extremities: No significant cyanosis, clubbing, or edema bilateral lower extremities  CBC: Recent Labs  Lab 05/28/2020 1834  WBC 7.8  NEUTROABS 6.7  HGB 10.6*  HCT 29.4*  MCV 83.5  PLT 174*   Basic Metabolic  Panel: Recent Labs  Lab 05/27/2020 1834  NA 126*  K 3.4*  CL 90*  CO2 24  GLUCOSE 122*  BUN 14  CREATININE 0.81  CALCIUM 7.8*   GFR: Estimated Creatinine Clearance: 65.7 mL/min (by C-G formula based on SCr of 0.81 mg/dL).  Liver Function Tests: Recent Labs  Lab 06/20/2020 1834  AST 41  ALT 27  ALKPHOS 45  BILITOT 0.9  PROT 5.6*  ALBUMIN 2.6*    Coagulation Profile: Recent Labs  Lab 06/08/2020 1834  INR 1.1    Recent Results (from the past 240 hour(s))  Blood Culture (routine x 2)     Status: None (Preliminary result)   Collection Time: 06/13/2020  6:34 PM   Specimen: BLOOD  Result Value Ref Range Status   Specimen Description   Final    BLOOD LEFT ANTECUBITAL Performed at [facility], [place] [address], [place], [place] [address]    Special Requests   Final    BOTTLES DRAWN AEROBIC AND ANAEROBIC Blood Culture adequate volume Performed at [facility] [address] [place], [place] [address]    Culture   Final    NO GROWTH < 12 HOURS Performed at [facility] [address], [place], [place] [address]    Report Status PENDING  Incomplete  Blood Culture (routine x 2)     Status: None (Preliminary result)   Collection Time: 06/16/2020  6:36 PM   Specimen: BLOOD LEFT HAND  Result Value Ref Range Status   Specimen Description   Final    BLOOD LEFT HAND Performed at [facility] [address], [place], [place] [address]    Special Requests   Final    BOTTLES DRAWN AEROBIC AND ANAEROBIC Blood Culture adequate volume Performed at [facility] [address] [place], [place] [address]    Culture   Final    NO GROWTH < 12 HOURS Performed at [facility] [address], [place],  [address]    Report Status PENDING  Incomplete     Scheduled Meds: . enoxaparin (LOVENOX) injection  40 mg Subcutaneous QHS  . levothyroxine  75 mcg Oral Q0600  . lisinopril  10 mg Oral  Daily  . potassium chloride  40 mEq Oral BID     LOS: 1 day   Cherene Altes, MD [facility] Office  [phone] Pager - Text Page per Amion  If 7PM-7AM, please contact night-coverage per Amion 06/10/2020, 4:07 PM

## 2020-06-11 ENCOUNTER — Inpatient Hospital Stay (HOSPITAL_COMMUNITY): Payer: Medicare Other

## 2020-06-11 ENCOUNTER — Telehealth: Payer: Self-pay | Admitting: *Deleted

## 2020-06-11 LAB — COMPREHENSIVE METABOLIC PANEL
ALT: 22 U/L (ref 0–44)
AST: 22 U/L (ref 15–41)
Albumin: 2.5 g/dL — ABNORMAL LOW (ref 3.5–5.0)
Alkaline Phosphatase: 40 U/L (ref 38–126)
Anion gap: 8 (ref 5–15)
BUN: 13 mg/dL (ref 8–23)
CO2: 25 mmol/L (ref 22–32)
Calcium: 8.2 mg/dL — ABNORMAL LOW (ref 8.9–10.3)
Chloride: 102 mmol/L (ref 98–111)
Creatinine, Ser: 0.7 mg/dL (ref 0.44–1.00)
GFR calc Af Amer: >60 mL/min (ref >60)
GFR calc non Af Amer: >60 mL/min (ref >60)
Glucose, Bld: 128 mg/dL — ABNORMAL HIGH (ref 70–99)
Potassium: 3.9 mmol/L (ref 3.5–5.1)
Sodium: 135 mmol/L (ref 135–145)
Total Bilirubin: 0.4 mg/dL (ref 0.3–1.2)
Total Protein: 5.2 g/dL — ABNORMAL LOW (ref 6.5–8.1)

## 2020-06-11 LAB — MAGNESIUM: Magnesium: 1.7 mg/dL (ref 1.7–2.4)

## 2020-06-11 LAB — CBC
HCT: 28.9 % — ABNORMAL LOW (ref 36.0–46.0)
Hemoglobin: 9.8 g/dL — ABNORMAL LOW (ref 12.0–15.0)
MCH: 29 pg (ref 26.0–34.0)
MCHC: 33.9 g/dL (ref 30.0–36.0)
MCV: 85.5 fL (ref 80.0–100.0)
Platelets: 114 10*3/uL — ABNORMAL LOW (ref 150–400)
RBC: 3.38 MIL/uL — ABNORMAL LOW (ref 3.87–5.11)
RDW: 13.4 % (ref 11.5–15.5)
WBC: 7 10*3/uL (ref 4.0–10.5)
nRBC: 0 % (ref 0.0–0.2)

## 2020-06-11 LAB — URINE CULTURE: Culture: NO GROWTH

## 2020-06-11 LAB — PHOSPHORUS: Phosphorus: 2.7 mg/dL (ref 2.5–4.6)

## 2020-06-11 MED ORDER — DEXAMETHASONE 4 MG PO TABS: 4.0000 mg | ORAL_TABLET | Freq: Every day | ORAL | Status: DC

## 2020-06-11 MED ORDER — DEXAMETHASONE SODIUM PHOSPHATE 10 MG/ML IJ SOLN
6.0000 mg | Freq: Once | INTRAMUSCULAR | Status: AC
  Administered 2020-06-11: 6 mg via INTRAVENOUS
  Filled 2020-06-11: qty 1

## 2020-06-11 MED ORDER — DEXAMETHASONE 4 MG PO TABS
4.0000 mg | ORAL_TABLET | Freq: Every day | ORAL | Status: DC
  Administered 2020-06-12: 4 mg via ORAL
  Filled 2020-06-11: qty 1

## 2020-06-11 NOTE — TOC Progression Note (Signed)
Transition of Care [facility]) - Progression Note    Patient Details  Name: Sandrine Bloodsworth MRN: [id number] Date of Birth: [birth year]  Transition of Care [facility]) CM/SW Contact  Purcell Mouton, RN Phone Number: 06/11/2020, 3:30 PM  Clinical Narrative:     Pt is a re-admit. Pt was home with [place] from [place]. TOC will continue to follow for discharge needs.   Expected Discharge Plan: [place] Barriers to Discharge: No Barriers Identified  Expected Discharge Plan and Services Expected Discharge Plan: [place]   Discharge Planning Services: CM Consult   Living arrangements for the past 2 months: Single Family Home                           HH Arranged: RN, PT, Nurse's Aide [place] Agency: [place] Date [place]: 06/11/20 Time HH Agency Contacted: 1000 Representative spoke with at [place]: [place] ([place]) Interventions    Readmission Risk Interventions No flowsheet data found.

## 2020-06-11 NOTE — Progress Notes (Signed)
Kristina Ramos  WUJ:[id number] DOB: 06-28-1941 DOA: 06/08/2020 PCP: Ann Held, DO    Brief Narrative:  79 year old with a history of follicular lymphoma, HTN, hypothyroidism, iron deficiency, and a hospitalization for Covid pneumonia 6/7 > 6/11.  She completed a course of remdesivir and was sent home on steroids and 2 L of nasal cannula support.  Family brought her back to the ED 6/19 because they felt she was more short of breath with increasing O2 requirements.  In the ED she was febrile to 101.1 and required 4 L oxygen support to keep her saturations within range.  She appeared lethargic.  She was found to have a sodium of 126.  CT scan of the head was unrevealing.  CXR noted only mild bilateral infiltrates stable versus very mildly increased since prior x-rays.  Significant Events: 6/7 POSITIVE SARS-CoV2 test 6/7 > 6/11 admit for Covid pneumonia 6/19 readmit via [facility] for same  Date of Positive COVID Test: 05/28/20  COVID-19 specific Treatment: Remdesivir 6/7 > 6/11 Decadron 6/7 > 6/16  Antimicrobials:  Cefepime 6/9 Vancomycin 7/19   Subjective: The patient reports that she feels significantly worse today.  She reports a lack of energy/lethargy.  She denies severe shortness of breath.  She denies appetite.  No chest pain nausea or vomiting.  The patient has required an increase in oxygen support from 2 to 4 L conventional nasal cannula.  A more detailed history elicited from her daughter reveals that the patient's symptom onset at home really began the day following discontinuation of her steroids.  Likewise, during this admission the patient felt markedly better on the day she was given steroid and then significantly worse the following day without steroid.  Perhaps there is an element of "steroid withdrawal" here though the patient is not chronically on steroids.  Assessment & Plan:  Persistent acute hypoxic respiratory failure - COVID Pneumonia I suspect her  hypoxia at admission may have been driven by lethargy/diminished respiratory drive related to dehydration - chest x-ray is not convincing for worsening infiltrates to my evaluation - I see no evidence of a bacterial pneumonia though this will certainly remain on the differential as it is a possibility -given the relation of steroid dosing and timing of symptoms I have chosen to resume the patient steroid dosing -if she feels significantly better tomorrow we will plan a very slow steroid taper -titrate oxygen downward as possible -encouraged incentive spirometer and mobilization as well as use of flutter valve  Recent Labs  Lab 05/22/2020 1834 06/10/20 0504 06/11/20 0610  DDIMER  --  2.41*  --   CRP  --  13.9*  --   ALT 27  --  22  PROCALCITON  --  <0.10  --     Hyponatremia Appeared to be hypovolemic in etiology - corrected w/ volume expansion   Hypokalemia Corrected with supplementation  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   DVT prophylaxis: Lovenox Code Status: FULL CODE Family Communication: Spoke with daughter via phone today, as well as yesterday Status is: Inpatient  Remains inpatient appropriate because:Inpatient level of care appropriate due to severity of illness   Dispo: The patient is from: Home  Anticipated d/c is to: Home              Anticipated d/c date is: 2 days              Patient currently is not medically stable to d/c.   Consultants:  none  Objective: Blood pressure (!) 159/74, pulse 85, temperature 97.8 F (36.6 C), temperature source Oral, resp. rate 18, height 5\' 4"  (1.626 m), weight 99.8 kg, SpO2 (!) 80 %.  Intake/Output Summary (Last 24  hours) at 06/11/2020 0827 Last data filed at 06/11/2020 0758 Gross per 24 hour  Intake 898.77 ml  Output 200 ml  Net 698.77 ml   Filed Weights   06/18/2020 1807  Weight: 99.8 kg    Examination: General: No acute respiratory distress at rest in chair -more lethargic Lungs: Scattered bilateral fine crackles Cardiovascular: RRR without murmur Abdomen: Nontender, nondistended, soft, bowel sounds positive, no rebound, no ascites, no appreciable mass Extremities: No significant cyanosis, clubbing, or edema bilateral lower extremities  CBC: Recent Labs  Lab 06/04/2020 1834 06/11/20 0610  WBC 7.8 7.0  NEUTROABS 6.7  --   HGB 10.6* 9.8*  HCT 29.4* 28.9*  MCV 83.5 85.5  PLT 125* 791*   Basic Metabolic Panel: Recent Labs  Lab 05/23/2020 1834 06/11/20 0610  NA 126* 135  K 3.4* 3.9  CL 90* 102  CO2 24 25  GLUCOSE 122* 128*  BUN 14 13  CREATININE 0.81 0.70  CALCIUM 7.8* 8.2*  MG  --  1.7  PHOS  --  2.7   GFR: Estimated Creatinine Clearance: 66.5 mL/min (by C-G formula based on SCr of 0.7 mg/dL).  Liver Function Tests: Recent Labs  Lab 05/28/2020 1834 06/11/20 0610  AST 41 22  ALT 27 22  ALKPHOS 45 40  BILITOT 0.9 0.4  PROT 5.6* 5.2*  ALBUMIN 2.6* 2.5*    Coagulation Profile: Recent Labs  Lab 05/28/2020 1834  INR 1.1    Recent Results (from the past 240 hour(s))  Urine culture     Status: None   Collection Time: 05/31/2020  6:12 PM   Specimen: In/Out Cath Urine  Result Value Ref Range Status   Specimen Description   Final    IN/OUT CATH URINE Performed at [facility], [place] [address] [place], [place] [address]    Special Requests   Final    NONE Performed at [facility], [place] [address] [place], [place] [address]    Culture   Final    NO GROWTH Performed at [facility], [place] [address], [place], [place] [address]    Report Status 06/11/2020 FINAL  Final  Blood Culture (routine x 2)     Status: None (Preliminary  result)   Collection Time: 06/08/2020  6:34 PM   Specimen: BLOOD  Result Value Ref Range Status   Specimen Description   Final    BLOOD LEFT ANTECUBITAL Performed at [facility] [address] [place], [place] [address]    Special Requests   Final    BOTTLES DRAWN AEROBIC AND ANAEROBIC Blood Culture adequate volume Performed at [facility] [address] [place], [place] [address]    Culture   Final    NO GROWTH 2 DAYS Performed at [facility] [address] [place], [place] [address]    Report Status PENDING  Incomplete  Blood Culture (routine x 2)     Status: None (Preliminary result)  Collection Time: 05/24/2020  6:36 PM   Specimen: BLOOD LEFT HAND  Result Value Ref Range Status   Specimen Description   Final    BLOOD LEFT HAND Performed at [facility] [address], [place], [place] [address]    Special Requests   Final    BOTTLES DRAWN AEROBIC AND ANAEROBIC Blood Culture adequate volume Performed at [facility] [address] [place], [place] [address]    Culture   Final    NO GROWTH 2 DAYS Performed at [facility] [address], [place], [place] [address]    Report Status PENDING  Incomplete     Scheduled Meds: . enoxaparin (LOVENOX) injection  40 mg Subcutaneous QHS  . levothyroxine  75 mcg Oral Q0600  . lisinopril  10 mg Oral Daily  . potassium chloride  40 mEq Oral BID     LOS: 2 days   Cherene Altes, MD [facility] Office  [phone] Pager - Text Page per Amion  If 7PM-7AM, please contact night-coverage per Amion 06/11/2020, 8:27 AM

## 2020-06-11 NOTE — Evaluation (Signed)
Occupational Therapy Evaluation Patient Details Name: Kristina Ramos MRN: [id number] DOB: 07-07-1941 Today's Date: 06/11/2020    History of Present Illness 79 y.o. female with medical history significant of follicular lymphoma, dyslipidemia, hypertension, hypothyroid, arthritis and depression. Admitted 6/7-6/11 with COVID. Discharged home on 2L 02. Readmitted 6/19 with lethargy, SOB and increased 02 demand.     Clinical Impression   At her baseline, pt is independent. She was using a RW and performing ADL independently, but assisted for IADL when she was discharge on 6/11. Pt presents with decreased activity tolerance with Sp02 dropping into the mid 80s on 4L and HR to 120. Will follow acutely.     Follow Up Recommendations  No OT follow up    Equipment Recommendations  Tub seat    Recommendations for Other Services       Precautions / Restrictions Precautions Precautions: Fall Precaution Comments: monitor 02 and HR closely      Mobility Bed Mobility               General bed mobility comments: in recliner upon arrival  Transfers Overall transfer level: Needs assistance Equipment used: Rolling walker (2 wheeled) Transfers: Sit to/from Stand Sit to Stand: Supervision         General transfer comment: no difficulty standing from recliner, cues for hand placement    Balance Overall balance assessment: Needs assistance   Sitting balance-Leahy Scale: Good     Standing balance support: No upper extremity supported Standing balance-Leahy Scale: Fair Standing balance comment: statically                           ADL either performed or assessed with clinical judgement   ADL Overall ADL's : Needs assistance/impaired Eating/Feeding: Independent   Grooming: Wash/dry hands;Supervision/safety;Standing   Upper Body Bathing: Set up;Sitting   Lower Body Bathing: Supervison/ safety;Sit to/from stand   Upper Body Dressing : Set up;Sitting   Lower Body  Dressing: Supervision/safety;Sit to/from stand   Toilet Transfer: Supervision/safety;Ambulation;RW   Toileting- Clothing Manipulation and Hygiene: Supervision/safety;Sit to/from stand       Functional mobility during ADLs: Supervision/safety;Rolling walker General ADL Comments: Pt with Sp02 drop to 84% HR to 120 with minimal exertion.     Vision Baseline Vision/History: Wears glasses Wears Glasses: Reading only Patient Visual Report: No change from baseline       Perception     Praxis      Pertinent Vitals/Pain Pain Assessment: No/denies pain     Hand Dominance Right   Extremity/Trunk Assessment Upper Extremity Assessment Upper Extremity Assessment: Overall WFL for tasks assessed   Lower Extremity Assessment Lower Extremity Assessment: Defer to PT evaluation   Cervical / Trunk Assessment Cervical / Trunk Assessment: Normal   Communication Communication Communication: No difficulties   Cognition Arousal/Alertness: Awake/alert Behavior During Therapy: WFL for tasks assessed/performed Overall Cognitive Status: Impaired/Different from baseline Area of Impairment: Memory                     Memory: Decreased short-term memory         General Comments: pt with memory deficits related to dates she was admitting/readmitted   General Comments       Exercises     Shoulder Instructions      Home Living Family/patient expects to be discharged to:: Private residence Living Arrangements: Alone Available Help at Discharge: Family;Friend(s);Available PRN/intermittently Type of Home: House Home Access: Stairs to enter [employer]  of Steps: 1-2   Home Layout: One level     Bathroom Shower/Tub: Occupational psychologist: Standard     Home Equipment: Environmental consultant - 2 wheels          Prior Functioning/Environment Level of Independence: Independent with assistive device(s)        Comments: pt was independent prior to bout with  COVID, has been ambulating with RW since discharge and assisted with IADL        OT Problem List: Decreased activity tolerance;Impaired balance (sitting and/or standing);Cardiopulmonary status limiting activity      OT Treatment/Interventions: Self-care/ADL training;Balance training;Therapeutic activities;Energy conservation;DME and/or AE instruction;Patient/family education    OT Goals(Current goals can be found in the care plan section) Acute Rehab OT Goals Patient Stated Goal: breathe better OT Goal Formulation: With patient Time For Goal Achievement: 06/25/20 Potential to Achieve Goals: Good ADL Goals Pt Will Perform Grooming: with modified independence;standing (x 3 activities) Pt Will Perform Lower Body Bathing: with modified independence;sit to/from stand Pt Will Perform Lower Body Dressing: with modified independence;sit to/from stand Pt Will Transfer to Toilet: with modified independence;ambulating;regular height toilet Pt Will Perform Toileting - Clothing Manipulation and hygiene: with modified independence;sit to/from stand Pt Will Perform Tub/Shower Transfer: with modified independence;ambulating;Shower transfer;shower seat Additional ADL Goal #1: Pt will generalize energy conservation strategies during ADL.  OT Frequency: Min 2X/week   Barriers to D/C:            Co-evaluation              AM-PAC OT "6 Clicks" Daily Activity     Outcome Measure Help from another person eating meals?: None Help from another person taking care of personal grooming?: A Little Help from another person toileting, which includes using toliet, bedpan, or urinal?: A Little Help from another person bathing (including washing, rinsing, drying)?: A Little Help from another person to put on and taking off regular upper body clothing?: None Help from another person to put on and taking off regular lower body clothing?: A Little 6 Click Score: 20   End of Session Equipment Utilized  During Treatment: Rolling walker;Oxygen (4L)  Activity Tolerance: Patient limited by fatigue Patient left: in chair;with call bell/phone within reach;with chair alarm set  OT Visit Diagnosis: Unsteadiness on feet (R26.81);Other (comment) (decreased activity tolerance)                Time: 1610-9604 OT Time Calculation (min): 22 min Charges:  OT General Charges $OT Visit: 1 Visit OT Evaluation $OT Eval Moderate Complexity: 1 Mod  Nestor Lewandowsky, OTR/L Acute Rehabilitation Services Pager: [phone] Office: [phone]  Malka So 06/11/2020, 2:49 PM

## 2020-06-11 NOTE — Evaluation (Signed)
Physical Therapy Evaluation Patient Details Name: Kristina Ramos MRN: [id number] DOB: 1941/06/13 Today's Date: 06/11/2020   History of Present Illness  79 y.o. female with medical history significant of follicular lymphoma, dyslipidemia, hypertension, hypothyroid, arthritis and depression. Admitted 6/7-6/11 with COVID. Discharged home on 2L 02. Readmitted 6/19 with lethargy, SOB and increased 02 demand.    Clinical Impression  Pt admitted with above diagnosis.  Pt currently with functional limitations due to the deficits listed below (see PT Problem List). Pt will benefit from skilled PT to increase their independence and safety with mobility to allow discharge to the venue listed below.  Pt assisted to bathroom per request and then too SOB and coughing to ambulate farther around room.  SpO2 also dropped to 80% on 3.5L O2 [place] and RN notified.  Pt anticipates return home and recommend resuming HHPT.     Follow Up Recommendations Home health PT    Equipment Recommendations  None recommended by PT    Recommendations for Other Services       Precautions / Restrictions Precautions Precautions: Fall Precaution Comments: monitor 02 and HR closely      Mobility  Bed Mobility               General bed mobility comments: in recliner upon arrival  Transfers Overall transfer level: Needs assistance Equipment used: Rolling walker (2 wheeled) Transfers: Sit to/from Stand Sit to Stand: Supervision         General transfer comment: no difficulty standing from recliner, cues for hand placement  Ambulation/Gait Ambulation/Gait assistance: Min guard Gait Distance (Feet): 16 Feet Assistive device: Rolling walker (2 wheeled) Gait Pattern/deviations: Step-through pattern;Decreased stride length     General Gait Details: slow but steady with RW, pt ambulated to/from bathroom, very SOB upon return to recliner (not able to rate however appeared 3/4) and SPO2 80% on 3.5 L, cues for pursed  lip breathing; required at least 3 minutes to recover  Stairs            Wheelchair Mobility    Modified Rankin (Stroke Patients Only)       Balance Overall balance assessment: Needs assistance   Sitting balance-Leahy Scale: Good     Standing balance support: No upper extremity supported Standing balance-Leahy Scale: Fair Standing balance comment: statically                             Pertinent Vitals/Pain Pain Assessment: No/denies pain    Home Living Family/patient expects to be discharged to:: Private residence Living Arrangements: Alone Available Help at Discharge: Family;Friend(s);Available PRN/intermittently Type of Home: House Home Access: Stairs to enter   [employer] of Steps: 1-2 Home Layout: One level Home Equipment: Walker - 2 wheels      Prior Function Level of Independence: Independent with assistive device(s)         Comments: pt was independent prior to bout with COVID, has been ambulating with RW since discharge and assisted with IADL     Hand Dominance   Dominant Hand: Right    Extremity/Trunk Assessment   Upper Extremity Assessment Upper Extremity Assessment: Overall WFL for tasks assessed    Lower Extremity Assessment Lower Extremity Assessment: Generalized weakness    Cervical / Trunk Assessment Cervical / Trunk Assessment: Normal  Communication   Communication: No difficulties  Cognition Arousal/Alertness: Awake/alert Behavior During Therapy: WFL for tasks assessed/performed Overall Cognitive Status: Within Functional Limits for tasks assessed Area of  Impairment: Memory                     Memory: Decreased short-term memory         General Comments: appropriate during session however limited conversation due to acitivity and dyspnea, coughing      General Comments      Exercises     Assessment/Plan    PT Assessment Patient needs continued PT services  PT Problem List  Decreased activity tolerance;Decreased balance;Decreased mobility;Decreased knowledge of use of DME;Cardiopulmonary status limiting activity       PT Treatment Interventions DME instruction;Gait training;Functional mobility training;Therapeutic activities;Therapeutic exercise;Balance training;Neuromuscular re-education;Patient/family education    PT Goals (Current goals can be found in the Care Plan section)  Acute Rehab PT Goals Patient Stated Goal: breathe better PT Goal Formulation: With patient Time For Goal Achievement: 06/25/20 Potential to Achieve Goals: Good    Frequency Min 3X/week   Barriers to discharge        Co-evaluation               AM-PAC PT "6 Clicks" Mobility  Outcome Measure Help needed turning from your back to your side while in a flat bed without using bedrails?: A Little Help needed moving from lying on your back to sitting on the side of a flat bed without using bedrails?: A Little Help needed moving to and from a bed to a chair (including a wheelchair)?: A Little Help needed standing up from a chair using your arms (e.g., wheelchair or bedside chair)?: A Little Help needed to walk in hospital room?: A Little Help needed climbing 3-5 steps with a railing? : A Little 6 Click Score: 18    End of Session Equipment Utilized During Treatment: Oxygen Activity Tolerance: Patient limited by fatigue Patient left: in chair;with call bell/phone within reach;with chair alarm set Nurse Communication: Mobility status PT Visit Diagnosis: Other abnormalities of gait and mobility (R26.89)    Time: 7341-9379 PT Time Calculation (min) (ACUTE ONLY): 21 min   Charges:   PT Evaluation $PT Eval Low Complexity: 1 Low        Kati PT, DPT Acute Rehabilitation Services Pager: [phone] Office: [phone]  Trena Platt 06/11/2020, 4:32 PM

## 2020-06-11 NOTE — Telephone Encounter (Signed)
Call received from patient's daughter to inform Dr. Marin Olp that pt was readmitted to the hospital on 06/08/2020.  Dr. Marin Olp notified.

## 2020-06-12 LAB — COMPREHENSIVE METABOLIC PANEL
ALT: 17 U/L (ref 0–44)
AST: 16 U/L (ref 15–41)
Albumin: 2.4 g/dL — ABNORMAL LOW (ref 3.5–5.0)
Alkaline Phosphatase: 41 U/L (ref 38–126)
Anion gap: 7 (ref 5–15)
BUN: 10 mg/dL (ref 8–23)
CO2: 25 mmol/L (ref 22–32)
Calcium: 8.1 mg/dL — ABNORMAL LOW (ref 8.9–10.3)
Chloride: 100 mmol/L (ref 98–111)
Creatinine, Ser: 0.62 mg/dL (ref 0.44–1.00)
GFR calc Af Amer: >60 mL/min (ref >60)
GFR calc non Af Amer: >60 mL/min (ref >60)
Glucose, Bld: 127 mg/dL — ABNORMAL HIGH (ref 70–99)
Potassium: 4.5 mmol/L (ref 3.5–5.1)
Sodium: 132 mmol/L — ABNORMAL LOW (ref 135–145)
Total Bilirubin: 0.5 mg/dL (ref 0.3–1.2)
Total Protein: 5.1 g/dL — ABNORMAL LOW (ref 6.5–8.1)

## 2020-06-12 LAB — CBC
HCT: 27.1 % — ABNORMAL LOW (ref 36.0–46.0)
Hemoglobin: 9.1 g/dL — ABNORMAL LOW (ref 12.0–15.0)
MCH: 29.3 pg (ref 26.0–34.0)
MCHC: 33.6 g/dL (ref 30.0–36.0)
MCV: 87.1 fL (ref 80.0–100.0)
Platelets: 109 10*3/uL — ABNORMAL LOW (ref 150–400)
RBC: 3.11 MIL/uL — ABNORMAL LOW (ref 3.87–5.11)
RDW: 13.6 % (ref 11.5–15.5)
WBC: 3.2 10*3/uL — ABNORMAL LOW (ref 4.0–10.5)
nRBC: 0 % (ref 0.0–0.2)

## 2020-06-12 LAB — C-REACTIVE PROTEIN: CRP: 6.5 mg/dL — ABNORMAL HIGH (ref <1.0)

## 2020-06-12 LAB — FERRITIN: Ferritin: 1123 ng/mL — ABNORMAL HIGH (ref 11–307)

## 2020-06-12 LAB — D-DIMER, QUANTITATIVE: D-Dimer, Quant: 2.21 ug/mL-FEU — ABNORMAL HIGH (ref 0.00–0.50)

## 2020-06-12 MED ORDER — METHYLPREDNISOLONE SODIUM SUCC 125 MG IJ SOLR
60.0000 mg | Freq: Every day | INTRAMUSCULAR | Status: DC
  Administered 2020-06-12 – 2020-06-16 (×5): 60 mg via INTRAVENOUS
  Filled 2020-06-12 (×5): qty 2

## 2020-06-12 MED ORDER — VITAMIN D 25 MCG (1000 UNIT) PO TABS
2000.0000 [IU] | ORAL_TABLET | Freq: Every day | ORAL | Status: DC
  Administered 2020-06-12 – 2020-06-24 (×13): 2000 [IU] via ORAL
  Filled 2020-06-12 (×14): qty 2

## 2020-06-12 MED ORDER — FUROSEMIDE 10 MG/ML IJ SOLN
40.0000 mg | Freq: Once | INTRAMUSCULAR | Status: AC
  Administered 2020-06-12: 40 mg via INTRAVENOUS
  Filled 2020-06-12: qty 4

## 2020-06-12 NOTE — Progress Notes (Signed)
Kristina Ramos  KGM:[id number] DOB: 10-25-1941 DOA: 06/08/2020 PCP: Ann Held, DO    Brief Narrative:  79 year old with a history of follicular lymphoma, HTN, hypothyroidism, iron deficiency, and a hospitalization for Covid pneumonia 6/7 > 6/11.  She completed a course of remdesivir and was sent home on steroids and 2 L of nasal cannula support.  Family brought her back to the ED 6/19 because they felt she was more short of breath with increasing O2 requirements.  In the ED she was febrile to 101.1 and required 4 L oxygen support to keep her saturations within range.  She appeared lethargic.  She was found to have a sodium of 126.  CT scan of the head was unrevealing.  CXR noted only mild bilateral infiltrates stable versus very mildly increased since prior x-rays.  Significant Events: 6/7 POSITIVE SARS-CoV2 test 6/7 > 6/11 admit for Covid pneumonia 6/19 readmit via [facility] for same  Date of Positive COVID Test: 05/28/20  COVID-19 specific Treatment: Remdesivir 6/7 > 6/11 Decadron 6/7 > 6/16  Antimicrobials:  Cefepime 6/9 Vancomycin 7/19   Subjective: The patient states she is feeling better today compared to yesterday.  She still feels somewhat short of breath and desaturates a bit with exertion.  She denies chest pain.  No nausea vomiting or abdominal pain.  Assessment & Plan:  Persistent acute hypoxic respiratory failure - COVID Pneumonia I suspect her hypoxia at admission may have been driven by lethargy/diminished respiratory drive - chest x-ray is not convincing for worsening infiltrates to my evaluation - I see no evidence of a bacterial pneumonia though this will certainly remain on the differential as it is a possibility - given the relation of steroid dosing and timing of symptoms I have chosen to resume the patient steroid dosing - titrate oxygen downward as possible -gently diurese today and attempt to improve oxygenation - encouraged incentive spirometer and  mobilization as well as use of flutter valve - PT/OT evals   Recent Labs  Lab 06/08/2020 1834 06/10/20 0504 06/11/20 0610 06/12/20 0442  DDIMER  --  2.41*  --  2.21*  FERRITIN  --   --   --  1,123*  CRP  --  13.9*  --  6.5*  ALT 27  --  22 17  PROCALCITON  --  <0.10  --   --     ?Addisonian crisis  The patient is not on chronic steroids and therefore there should be no reason for her to require a lengthy tapering of her steroid dose -nonetheless after the discontinuation of the patient's Decadron on her 10th day of dosing she rapidly decompensated -likewise her symptoms rapidly improved when she was given an additional dose of Decadron, and in the absence of steroids she again rapidly declined approximately 24 hours later -with resumption of steroid dosing her symptoms appear to have stabilized significantly -we will plan for a slow protracted steroid taper as a result  Hyponatremia Appeared to be hypovolemic in etiology - corrected w/ volume expansion   Hypokalemia Corrected with supplementation  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  DVT prophylaxis: Lovenox Code Status: FULL CODE Family Communication: Spoke with daughter via phone Status is: Inpatient  Remains inpatient appropriate because:Inpatient level of care appropriate due to severity of illness   Dispo: The patient is from: Home              Anticipated d/c is to: Home              Anticipated d/c date is: 2 days              Patient currently is not medically stable to d/c.   Consultants:   none  Objective: Blood pressure 139/82, pulse 98, temperature 97.8 F (36.6 C), temperature source Oral, resp. rate (!) 21, height 5\' 4"  (1.626 m), weight 99.8 kg, SpO2 (!) 89 %. No intake or output data in the 24 hours ending 06/12/20 0834 Filed Weights   05/25/2020 1807  Weight: 99.8 kg    Examination: General: No acute respiratory distress at rest in chair -more alert today Lungs: Scattered bilateral fine crackles with no wheezing Cardiovascular: RRR without murmur Abdomen: NT/ND, soft, BS positive, no rebound Extremities: Trace bilateral lower extremity edema  CBC: Recent Labs  Lab 06/11/2020 1834 06/11/20 0610 06/12/20 0442  WBC 7.8 7.0 3.2*  NEUTROABS 6.7  --   --   HGB 10.6* 9.8* 9.1*  HCT 29.4* 28.9* 27.1*  MCV 83.5 85.5 87.1  PLT 125* 114* 025*   Basic Metabolic Panel: Recent Labs  Lab 06/14/2020 1834 06/11/20 0610 06/12/20 0442  NA 126* 135 132*  K 3.4* 3.9 4.5  CL 90* 102 100  CO2 24 25 25   GLUCOSE 122* 128* 127*  BUN 14 13 10   CREATININE 0.81 0.70 0.62  CALCIUM 7.8* 8.2* 8.1*  MG  --  1.7  --   PHOS  --  2.7  --    GFR: Estimated Creatinine Clearance: 66.5 mL/min (by C-G formula based on SCr of 0.62 mg/dL).  Liver Function Tests: Recent Labs  Lab 06/03/2020 1834 06/11/20 0610 06/12/20 0442  AST 41 22 16  ALT 27 22 17   ALKPHOS 45 40 41  BILITOT 0.9 0.4 0.5  PROT 5.6* 5.2* 5.1*  ALBUMIN 2.6* 2.5* 2.4*    Coagulation Profile: Recent Labs  Lab 06/16/2020 1834  INR 1.1    Recent Results (from the past 240 hour(s))  Urine culture     Status: None   Collection Time: 05/22/2020  6:12 PM   Specimen: In/Out Cath Urine  Result Value Ref Range Status   Specimen Description   Final    IN/OUT CATH URINE Performed at [facility], [place] [address], [place], [place] [address]    Special Requests   Final    NONE Performed at [facility], [place] [address], [place], [place] [address]    Culture   Final    NO  GROWTH Performed at [facility], [place] [address] [place], [place] [address]    Report Status 06/11/2020 FINAL  Final  Blood Culture (routine x 2)     Status: None (Preliminary result)   Collection Time: 05/27/2020  6:34 PM   Specimen: BLOOD  Result Value Ref Range Status   Specimen Description   Final    BLOOD LEFT ANTECUBITAL Performed at [facility] [address], [place], [place] [address]    Special Requests   Final    BOTTLES DRAWN AEROBIC AND ANAEROBIC Blood Culture adequate volume Performed at [facility],  [place] [address] [place], [place] [address]    Culture   Final    NO GROWTH 2 DAYS Performed at [facility] [address], [place], [place] [address]    Report Status PENDING  Incomplete  Blood Culture (routine x 2)     Status: None (Preliminary result)   Collection Time: 06/06/2020  6:36 PM   Specimen: BLOOD LEFT HAND  Result Value Ref Range Status   Specimen Description   Final    BLOOD LEFT HAND Performed at [facility] [address] [place], [place] [address]    Special Requests   Final    BOTTLES DRAWN AEROBIC AND ANAEROBIC Blood Culture adequate volume Performed at [facility] [address], [place], [place] [address]    Culture   Final    NO GROWTH 2 DAYS Performed at [facility] [address] [place],  [address]    Report Status PENDING  Incomplete     Scheduled Meds: . dexamethasone  4 mg Oral Daily  . enoxaparin (LOVENOX) injection  40 mg Subcutaneous QHS  . levothyroxine  75 mcg Oral Q0600  . lisinopril  10 mg Oral Daily  . potassium chloride  40 mEq Oral BID     LOS: 3 days   Cherene Altes, MD [facility] Office  [phone] Pager - Text Page per Amion  If 7PM-7AM, please contact night-coverage per Amion 06/12/2020, 8:34 AM

## 2020-06-12 NOTE — Care Management Important Message (Signed)
Important Message  Patient Details IM Letter given to Gabriel Earing RN Case Manager to present to the Patient Name: Kristina Ramos MRN: [id number] Date of Birth: Jan 03, 1941   Medicare Important Message Given:  Yes     Kerin Salen 06/12/2020, 10:20 AM

## 2020-06-13 LAB — BASIC METABOLIC PANEL
Anion gap: 11 (ref 5–15)
BUN: 12 mg/dL (ref 8–23)
CO2: 26 mmol/L (ref 22–32)
Calcium: 8.6 mg/dL — ABNORMAL LOW (ref 8.9–10.3)
Chloride: 99 mmol/L (ref 98–111)
Creatinine, Ser: 0.64 mg/dL (ref 0.44–1.00)
GFR calc Af Amer: >60 mL/min (ref >60)
GFR calc non Af Amer: >60 mL/min (ref >60)
Glucose, Bld: 111 mg/dL — ABNORMAL HIGH (ref 70–99)
Potassium: 5.1 mmol/L (ref 3.5–5.1)
Sodium: 136 mmol/L (ref 135–145)

## 2020-06-13 LAB — CBC
HCT: 32.2 % — ABNORMAL LOW (ref 36.0–46.0)
Hemoglobin: 10.9 g/dL — ABNORMAL LOW (ref 12.0–15.0)
MCH: 29.2 pg (ref 26.0–34.0)
MCHC: 33.9 g/dL (ref 30.0–36.0)
MCV: 86.3 fL (ref 80.0–100.0)
Platelets: 148 10*3/uL — ABNORMAL LOW (ref 150–400)
RBC: 3.73 MIL/uL — ABNORMAL LOW (ref 3.87–5.11)
RDW: 13.5 % (ref 11.5–15.5)
WBC: 7.9 10*3/uL (ref 4.0–10.5)
nRBC: 0 % (ref 0.0–0.2)

## 2020-06-13 NOTE — Progress Notes (Signed)
PROGRESS NOTE  Kristina Ramos  UXL:[id number] DOB: [birth year] DOA: 05/24/2020 PCP: Ann Held, DO   Brief Narrative: Kristina Ramos is a 79 y.o. female with a history of follicular lymphoma, HTN, hypothyroidism, iron deficiency, and hospitalization 6/7-6/11 for covid-19 pneumonia discharged on decadron requiring 2L O2 having completed remdesivir. She completed decadron 6/16 and returned to ED 6/19 due to worsening dyspnea, lethargy, found to be febrile to 101.21F with 4L O2 requirement. CXR demonstrated stable bilateral infiltrates with no new focal consolidation. She was lethargic, CT head unrevealing, and hyponatremic (Na 126). She was admitted, started back on steroids with improved mentation, strength, and given lasix empirically due to persistent desaturations worse with exertion.  Assessment & Plan: Principal Problem:   Acute on chronic respiratory failure with hypoxia (HCC) Active Problems:   Hypothyroidism   Essential hypertension, benign   Follicular lymphoma grade ii, lymph nodes of multiple sites ([place])   Hyponatremia  Acute on chronic hypoxic respiratory due to covid-19 pneumonia: Remains hypoxemic since recent admission. CRP up to 13.9 on 6/20 after declining 11.4 >> 3.6 at discharge. Of note, pt had been fully immunized prior to contracting covid. - s/p remesivir 6/7 - 6/11 - s/p decadron 6/7 - 6/16, restarted 6/19, will continue. - Continue isolation x21 days from positive test (6/7) - Continue supplemental oxygen, recommend proning if possible. Atelectasis likely also contributing, so IS and OOB encouraged.  - Received vancomycin, cefepime, though no focal consolidation consistent with HCAP, PCT negative. No further abx planned at this time.  - Continue gentle trial of diuresis to optimize oxygenation.   Relative adrenal insufficiency: Significant global symptomatic improvement with reinitiation of steroids.  - Will continue steroids with anticipated prolonged  taper.  Pancytopenia: Lymphopenia on admission with normocytic anemia and thrombocytopenia. Likely related to viral infection with hemodilutional process with IVF. Splenomegaly related to lymphoma likely contributing. - Monitoring.  Hyponatremia: ?related to relative AI.  - Continue monitoring with lasix.   Hypokalemia: Supplemented.  - Monitoring  Follicular lymphoma: follows with Dr. 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  Obesity: Estimated body mass index is 37.76 kg/m as calculated from the following:   Height as of this encounter: 5\' 4"  (1.626 m).   Weight as of this encounter: 99.8 kg.  DVT prophylaxis: Lovenox Code Status: Full Family Communication: Daughter by phone Disposition Plan:  Status is: Inpatient  Remains inpatient appropriate because:Inpatient level of care appropriate due to severity of illness and severe hypoxemia.   Dispo: The patient is from: Home              Anticipated d/c is to: TBD              Anticipated d/c date is: 2 days based on persistent severe hypoxemia and recent bounceback.              Patient currently is not medically stable to d/c.  Consultants:   None  Procedures:   None  Antimicrobials:  Vancomycin, cefepime x1   Subjective: Feels less fatigue, lethargy, weakness than at admission. Severely short of breath. Takes a long time to have dyspnea improve and hypoxia resolve after exertion but, she says, "so what? I've got to stay active."   Objective: Vitals:   06/13/20 0501 06/13/20  0840 06/13/20 0900 06/13/20 1344  BP: (!) 163/76   128/69  Pulse: 85   84  Resp: 20   18  Temp: 98.1 F (36.7 C)   (!) 97.5 F (36.4 C)  TempSrc: Oral   Oral  SpO2: 90% (!) 78% (!) 89% 97%  Weight:      Height:         Intake/Output Summary (Last 24 hours) at 06/13/2020 1716 Last data filed at 06/13/2020 1300 Gross per 24 hour  Intake 580 ml  Output 1100 ml  Net -520 ml   Filed Weights   06/15/2020 1807  Weight: 99.8 kg    Gen: 79 y.o. female in no distress  Pulm: Non-labored breathing supplemental oxygen. Fine crackles throughout bilaterally.  CV: Regular rate and rhythm. No murmur, rub, or gallop. No JVD, trace pedal edema. GI: Abdomen soft, non-tender, non-distended, with normoactive bowel sounds. No organomegaly or masses felt. Ext: Warm, no deformities Skin: No rashes, lesions or ulcers Neuro: Alert and oriented. No focal neurological deficits. Psych: Judgement and insight appear normal. Mood & affect appropriate.   Data Reviewed: I have personally reviewed following labs and imaging studies  CBC: Recent Labs  Lab 06/02/2020 1834 06/11/20 0610 06/12/20 0442 06/13/20 1048  WBC 7.8 7.0 3.2* 7.9  NEUTROABS 6.7  --   --   --   HGB 10.6* 9.8* 9.1* 10.9*  HCT 29.4* 28.9* 27.1* 32.2*  MCV 83.5 85.5 87.1 86.3  PLT 125* 114* 109* 696*   Basic Metabolic Panel: Recent Labs  Lab 06/04/2020 1834 06/11/20 0610 06/12/20 0442 06/13/20 0519  NA 126* 135 132* 136  K 3.4* 3.9 4.5 5.1  CL 90* 102 100 99  CO2 24 25 25 26   GLUCOSE 122* 128* 127* 111*  BUN 14 13 10 12   CREATININE 0.81 0.70 0.62 0.64  CALCIUM 7.8* 8.2* 8.1* 8.6*  MG  --  1.7  --   --   PHOS  --  2.7  --   --    GFR: Estimated Creatinine Clearance: 66.5 mL/min (by C-G formula based on SCr of 0.64 mg/dL). Liver Function Tests: Recent Labs  Lab 06/03/2020 1834 06/11/20 0610 06/12/20 0442  AST 41 22 16  ALT 27 22 17   ALKPHOS 45 40 41  BILITOT 0.9 0.4 0.5  PROT 5.6* 5.2* 5.1*  ALBUMIN 2.6* 2.5* 2.4*   No results for input(s): LIPASE, AMYLASE in the last 168 hours. No results for input(s): AMMONIA in the last 168 hours. Coagulation Profile: Recent Labs  Lab 06/08/2020 1834  INR 1.1   Cardiac Enzymes: No results  for input(s): CKTOTAL, CKMB, CKMBINDEX, TROPONINI in the last 168 hours. BNP (last 3 results) No results for input(s): PROBNP in the last 8760 hours. HbA1C: No results for input(s): HGBA1C in the last 72 hours. CBG: No results for input(s): GLUCAP in the last 168 hours. Lipid Profile: No results for input(s): CHOL, HDL, LDLCALC, TRIG, CHOLHDL, LDLDIRECT in the last 72 hours. Thyroid Function Tests: No results for input(s): TSH, T4TOTAL, FREET4, T3FREE, THYROIDAB in the last 72 hours. Anemia Panel: Recent Labs    06/12/20 0442  FERRITIN 1,123*   Urine analysis:    Component Value Date/Time   COLORURINE YELLOW 06/03/2020 1812   APPEARANCEUR CLEAR 06/16/2020 1812   LABSPEC 1.015 06/04/2020 1812   PHURINE 6.0 05/27/2020 1812   GLUCOSEU NEGATIVE 05/28/2020 1812   HGBUR NEGATIVE 06/08/2020 [place] NEGATIVE 06/05/2020 [place] 06/10/2020 1812   PROTEINUR  NEGATIVE 05/25/2020 1812   NITRITE NEGATIVE 06/13/2020 1812   LEUKOCYTESUR NEGATIVE 06/06/2020 1812   Recent Results (from the past 240 hour(s))  Urine culture     Status: None   Collection Time: 06/16/2020  6:12 PM   Specimen: In/Out Cath Urine  Result Value Ref Range Status   Specimen Description   Final    IN/OUT CATH URINE Performed at [facility] [address] [place], [place] [address]    Special Requests   Final    NONE Performed at [facility], [place] [address], [place], [place] [address]    Culture   Final    NO GROWTH Performed at [facility], [place] [address] [place], [place] [address]    Report Status 06/11/2020 FINAL  Final  Blood Culture (routine x 2)     Status: None (Preliminary result)   Collection Time: 06/07/2020  6:34 PM   Specimen: BLOOD  Result Value Ref Range Status   Specimen Description   Final    BLOOD LEFT ANTECUBITAL Performed at [facility] [address] [place], [place] [address]    Special  Requests   Final    BOTTLES DRAWN AEROBIC AND ANAEROBIC Blood Culture adequate volume Performed at [facility] [address] [place], [place] [address]    Culture   Final    NO GROWTH 4 DAYS Performed at [facility], [place] [address], [place], [place] [address]    Report Status PENDING  Incomplete  Blood Culture (routine x 2)     Status: None (Preliminary result)   Collection Time: 06/15/2020  6:36 PM   Specimen: BLOOD LEFT HAND  Result Value Ref Range Status   Specimen Description   Final    BLOOD LEFT HAND Performed at [facility] [address] [place], [place] [address]    Special Requests   Final    BOTTLES DRAWN AEROBIC AND ANAEROBIC Blood Culture adequate volume Performed at [facility] [address], [place], [place] [address]    Culture   Final    NO GROWTH 4 DAYS Performed at [facility], [place] [address] [place],  [address]    Report Status PENDING  Incomplete      Radiology Studies: No results found.  Scheduled Meds: . cholecalciferol  2,000 Units Oral Daily  . enoxaparin (LOVENOX) injection  40 mg Subcutaneous QHS  . levothyroxine  75 mcg Oral Q0600  . lisinopril  10 mg Oral Daily  . methylPREDNISolone (SOLU-MEDROL) injection  60 mg Intravenous Daily   Continuous Infusions:   LOS: 4 days   Time spent: 25 minutes.  Patrecia Pour, MD [facility] [email or web address] 06/13/2020, 5:16 PM

## 2020-06-13 NOTE — Progress Notes (Signed)
Patient sat 94% while at rest in bed prior to getting up to BR.  After return to chair, sat 77% on 4L.  O2 increased to 6L, but not recovering as quickly as the day before.  Unable to get sat to surpass 78% on 6L. Placed patient on HFNC, increased to 10L and patients sat increased to 89%.  MD made aware - will continue to monitor and wean as able

## 2020-06-14 ENCOUNTER — Inpatient Hospital Stay (HOSPITAL_COMMUNITY): Payer: Medicare Other

## 2020-06-14 DIAGNOSIS — U071 COVID-19: Secondary | ICD-10-CM

## 2020-06-14 LAB — CBC WITH DIFFERENTIAL/PLATELET
Abs Immature Granulocytes: 0.09 10*3/uL — ABNORMAL HIGH (ref 0.00–0.07)
Basophils Absolute: 0 10*3/uL (ref 0.0–0.1)
Basophils Relative: 0 %
Eosinophils Absolute: 0 10*3/uL (ref 0.0–0.5)
Eosinophils Relative: 0 %
HCT: 27.3 % — ABNORMAL LOW (ref 36.0–46.0)
Hemoglobin: 9.4 g/dL — ABNORMAL LOW (ref 12.0–15.0)
Immature Granulocytes: 2 %
Lymphocytes Relative: 3 %
Lymphs Abs: 0.2 10*3/uL — ABNORMAL LOW (ref 0.7–4.0)
MCH: 29.3 pg (ref 26.0–34.0)
MCHC: 34.4 g/dL (ref 30.0–36.0)
MCV: 85 fL (ref 80.0–100.0)
Monocytes Absolute: 0.2 10*3/uL (ref 0.1–1.0)
Monocytes Relative: 4 %
Neutro Abs: 5.4 10*3/uL (ref 1.7–7.7)
Neutrophils Relative %: 91 %
Platelets: 126 10*3/uL — ABNORMAL LOW (ref 150–400)
RBC: 3.21 MIL/uL — ABNORMAL LOW (ref 3.87–5.11)
RDW: 13.4 % (ref 11.5–15.5)
WBC: 5.9 10*3/uL (ref 4.0–10.5)
nRBC: 0 % (ref 0.0–0.2)

## 2020-06-14 LAB — COMPREHENSIVE METABOLIC PANEL
ALT: 16 U/L (ref 0–44)
AST: 17 U/L (ref 15–41)
Albumin: 2.5 g/dL — ABNORMAL LOW (ref 3.5–5.0)
Alkaline Phosphatase: 39 U/L (ref 38–126)
Anion gap: 9 (ref 5–15)
BUN: 13 mg/dL (ref 8–23)
CO2: 24 mmol/L (ref 22–32)
Calcium: 8.2 mg/dL — ABNORMAL LOW (ref 8.9–10.3)
Chloride: 101 mmol/L (ref 98–111)
Creatinine, Ser: 0.57 mg/dL (ref 0.44–1.00)
GFR calc Af Amer: >60 mL/min (ref >60)
GFR calc non Af Amer: >60 mL/min (ref >60)
Glucose, Bld: 108 mg/dL — ABNORMAL HIGH (ref 70–99)
Potassium: 4.7 mmol/L (ref 3.5–5.1)
Sodium: 134 mmol/L — ABNORMAL LOW (ref 135–145)
Total Bilirubin: 1.1 mg/dL (ref 0.3–1.2)
Total Protein: 5.2 g/dL — ABNORMAL LOW (ref 6.5–8.1)

## 2020-06-14 LAB — MAGNESIUM: Magnesium: 1.7 mg/dL (ref 1.7–2.4)

## 2020-06-14 LAB — CULTURE, BLOOD (ROUTINE X 2)
Culture: NO GROWTH
Culture: NO GROWTH
Special Requests: ADEQUATE
Special Requests: ADEQUATE

## 2020-06-14 LAB — C-REACTIVE PROTEIN: CRP: 3.6 mg/dL — ABNORMAL HIGH (ref <1.0)

## 2020-06-14 LAB — D-DIMER, QUANTITATIVE: D-Dimer, Quant: 2.55 ug/mL-FEU — ABNORMAL HIGH (ref 0.00–0.50)

## 2020-06-14 MED ORDER — ORAL CARE MOUTH RINSE
15.0000 mL | Freq: Two times a day (BID) | OROMUCOSAL | Status: DC
  Administered 2020-06-15 – 2020-06-26 (×20): 15 mL via OROMUCOSAL

## 2020-06-14 MED ORDER — FUROSEMIDE 10 MG/ML IJ SOLN
40.0000 mg | Freq: Every day | INTRAMUSCULAR | Status: DC
  Administered 2020-06-14 – 2020-06-16 (×3): 40 mg via INTRAVENOUS
  Filled 2020-06-14 (×3): qty 4

## 2020-06-14 MED ORDER — CHLORHEXIDINE GLUCONATE CLOTH 2 % EX PADS
6.0000 | MEDICATED_PAD | Freq: Every day | CUTANEOUS | Status: DC
  Administered 2020-06-15: 6 via TOPICAL

## 2020-06-14 NOTE — Progress Notes (Signed)
OT Cancellation Note  Patient Details Name: Kristina Ramos MRN: [id number] DOB: 1941/08/23   Cancelled Treatment:    Reason Eval/Treat Not Completed: Medical issues which prohibited therapy. Awaiting results of ultrasound for r/o UE DVT. Will check back 6/25 as schedule permits.  Delbert Phenix OT Pager: [phone] 06/14/2020, 1:30 PM

## 2020-06-14 NOTE — Progress Notes (Signed)
PIV consult: 2 unsuccessful attempts. Long 22g placed in RUE. Pt with bruising to B arms. Incidentally, L brachial vein not compressible on ultrasound. MD made aware while rounding.

## 2020-06-14 NOTE — Progress Notes (Signed)
VASCULAR LAB    Left upper extremity venous duplex completed.    Preliminary report:  See CV proc for preliminary results.  Auden Tatar, RVT 06/14/2020, 1:43 PM

## 2020-06-14 NOTE — Progress Notes (Signed)
PROGRESS NOTE  Kristina Ramos  XTK:[id number] DOB: 11/16/1941 DOA: 06/14/2020 PCP: Ann Held, DO   Brief Narrative: Kristina Ramos is a 79 y.o. female with a history of follicular lymphoma, HTN, hypothyroidism, iron deficiency, and hospitalization 6/7-6/11 for covid-19 pneumonia discharged on decadron requiring 2L O2 having completed remdesivir. She completed decadron 6/16 and returned to ED 6/19 due to worsening dyspnea, lethargy, found to be febrile to 101.29F with 4L O2 requirement. CXR demonstrated stable bilateral infiltrates with no new focal consolidation. She was lethargic, CT head unrevealing, and hyponatremic (Na 126). She was admitted, started back on steroids with improved mentation, strength, and given lasix empirically due to persistent desaturations worse with exertion.  Assessment & Plan: Principal Problem:   Acute on chronic respiratory failure with hypoxia (HCC) Active Problems:   Hypothyroidism   Essential hypertension, benign   Follicular lymphoma grade ii, lymph nodes of multiple sites ([place])   Hyponatremia  Acute on chronic hypoxic respiratory due to covid-19 pneumonia: Remains hypoxemic since recent admission. CRP up to 13.9 on 6/20 after declining 11.4 >> 3.6 at discharge. Of note, pt had been fully immunized prior to contracting covid. - s/p remesivir 6/7 - 6/11 - s/p decadron 6/7 - 6/16, restarted 6/19 >> - CRP declining as hoped.  - Continue isolation x21 days from positive test (6/7) - Continue supplemental oxygen, recommend proning if possible. Atelectasis likely also contributing, so IS and OOB encouraged.  - Received vancomycin, cefepime, though no focal consolidation consistent with HCAP, PCT was negative. No further abx planned at this time, though will monitor closely for this complication.  - D-dimer remains stable without acute rise and exam remains consistent with covid pneumonia. Will continue trending and plan CTA chest if no improvement and  consistently elevated d-dimer despite improvement in CRP. - Continue gentle trial of diuresis to optimize oxygenation.   Relative adrenal insufficiency: Significant global symptomatic improvement with reinitiation of steroids.  - Will continue steroids with anticipated prolonged taper. Unable to taper currently.  Non-compressible left brachial vein: Per IV team. No swelling/pain. Vascular U/S performed without evidence of DVT or SVT.  Pancytopenia: Lymphopenia on admission with normocytic anemia and thrombocytopenia. Likely related to viral infection with hemodilutional process with IVF. Splenomegaly related to lymphoma likely contributing. - Monitoring.  Hyponatremia: ?related to relative AI.  - Continue monitoring with lasix.   Hypokalemia: Supplemented.  - Monitoring  Follicular lymphoma: follows with Dr. 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  Obesity: Estimated body mass index is 37.76 kg/m as calculated from the following:   Height as of this encounter: 5\' 4"  (1.626 m).   Weight as of this encounter: 99.8 kg.  DVT prophylaxis: Lovenox Code Status: Full Family Communication: Daughter by phone Disposition Plan: Given escalation to [facility] and slightly labored breathing this AM, will transfer to SDU for closer monitoring of respiratory status for 24 hours. Status is: Inpatient  Remains inpatient appropriate because:Inpatient level of care appropriate due to severity of illness and severe hypoxemia.   Dispo: The patient is from: Home              Anticipated d/c is to: TBD              Anticipated d/c date is: > 3 days  Patient currently is not medically stable to d/c.  Consultants:   Case discussed briefly with PCCM, Dr. Vaughan Browner  6/24.  Procedures:   None  Antimicrobials:  Vancomycin, cefepime x1   Subjective: Had a bad morning, did not get back above 85% despite 13L HFNC at rest, so started heated high flow with improved SpO2 to 93% on 30LPM at 100% FiO2.  Objective: Vitals:   06/14/20 1341 06/14/20 1526 06/14/20 1645 06/14/20 1916  BP: (!) 148/83     Pulse: 89 80    Resp: (!) 22 18    Temp: 98.1 F (36.7 C)     TempSrc: Oral     SpO2: 100% 96% 95% 92%  Weight:      Height:        Intake/Output Summary (Last 24 hours) at 06/14/2020 2015 Last data filed at 06/14/2020 1646 Gross per 24 hour  Intake 480 ml  Output 2850 ml  Net -2370 ml   Filed Weights   05/25/2020 1807  Weight: 99.8 kg   Gen: 79 y.o. female in mild distress this AM Pulm: Tachypneic, slightly labored when speaking a full sentence, no accessory muscle use. Crackles diffusely bilaterally. CV: Regular rate and rhythm. No murmur, rub, or gallop. No JVD, trace dependent edema. GI: Abdomen soft, non-tender, non-distended, with normoactive bowel sounds.  Ext: Warm, no deformities Skin: No new rashes, lesions or ulcers on visualized skin. Neuro: Alert and oriented. No focal neurological deficits. Psych: Judgement and insight appear fair. Mood euthymic & affect congruent. Behavior is appropriate.    Data Reviewed: I have personally reviewed following labs and imaging studies  CBC: Recent Labs  Lab 06/03/2020 1834 06/11/20 0610 06/12/20 0442 06/13/20 1048 06/14/20 0454  WBC 7.8 7.0 3.2* 7.9 5.9  NEUTROABS 6.7  --   --   --  5.4  HGB 10.6* 9.8* 9.1* 10.9* 9.4*  HCT 29.4* 28.9* 27.1* 32.2* 27.3*  MCV 83.5 85.5 87.1 86.3 85.0  PLT 125* 114* 109* 148* 315*   Basic Metabolic Panel: Recent Labs  Lab 06/08/2020 1834 06/11/20 0610 06/12/20 0442 06/13/20 0519 06/14/20 0454  NA 126* 135 132* 136 134*  K 3.4* 3.9 4.5 5.1 4.7  CL 90* 102 100 99 101  CO2 24 25 25 26 24   GLUCOSE 122* 128* 127* 111* 108*  BUN 14 13 10 12 13    CREATININE 0.81 0.70 0.62 0.64 0.57  CALCIUM 7.8* 8.2* 8.1* 8.6* 8.2*  MG  --  1.7  --   --  1.7  PHOS  --  2.7  --   --   --    GFR: Estimated Creatinine Clearance: 66.5 mL/min (by C-G formula based on SCr of 0.57 mg/dL). Liver Function Tests: Recent Labs  Lab 06/04/2020 1834 06/11/20 0610 06/12/20 0442 06/14/20 0454  AST 41 22 16 17   ALT 27 22 17 16   ALKPHOS 45 40 41 39  BILITOT 0.9 0.4 0.5 1.1  PROT 5.6* 5.2* 5.1* 5.2*  ALBUMIN 2.6* 2.5* 2.4* 2.5*   No results for input(s): LIPASE, AMYLASE in the last 168 hours. No results for input(s): AMMONIA in the last 168 hours. Coagulation Profile: Recent Labs  Lab 06/05/2020 1834  INR 1.1   Cardiac Enzymes: No results for input(s): CKTOTAL, CKMB, CKMBINDEX, TROPONINI in the last 168 hours. BNP (last 3 results) No results for input(s): PROBNP in the last 8760 hours. HbA1C: No results for input(s): HGBA1C in the last 72 hours. CBG: No results for input(s): GLUCAP in the last 168  hours. Lipid Profile: No results for input(s): CHOL, HDL, LDLCALC, TRIG, CHOLHDL, LDLDIRECT in the last 72 hours. Thyroid Function Tests: No results for input(s): TSH, T4TOTAL, FREET4, T3FREE, THYROIDAB in the last 72 hours. Anemia Panel: Recent Labs    06/12/20 0442  FERRITIN 1,123*   Urine analysis:    Component Value Date/Time   COLORURINE YELLOW 06/12/2020 1812   APPEARANCEUR CLEAR 06/03/2020 1812   LABSPEC 1.015 06/08/2020 1812   PHURINE 6.0 06/15/2020 1812   GLUCOSEU NEGATIVE 06/14/2020 1812   HGBUR NEGATIVE 06/08/2020 [place] NEGATIVE 05/28/2020 1812   KETONESUR NEGATIVE 06/18/2020 1812   PROTEINUR NEGATIVE 06/18/2020 1812   NITRITE NEGATIVE 06/03/2020 1812   LEUKOCYTESUR NEGATIVE 06/18/2020 1812   Recent Results (from the past 240 hour(s))  Urine culture     Status: None   Collection Time: 06/05/2020  6:12 PM   Specimen: In/Out Cath Urine  Result Value Ref Range Status   Specimen Description   Final    IN/OUT CATH  URINE Performed at [facility], [place] [address], [place], [place] [address]    Special Requests   Final    NONE Performed at [facility], [place] [address], [place], [place] [address]    Culture   Final    NO GROWTH Performed at [facility], [place] [address] [place], [place] [address]    Report Status 06/11/2020 FINAL  Final  Blood Culture (routine x 2)     Status: None   Collection Time: 06/20/2020  6:34 PM   Specimen: BLOOD  Result Value Ref Range Status   Specimen Description   Final    BLOOD LEFT ANTECUBITAL Performed at [place] [address] [place], [place] [address]    Special Requests   Final    BOTTLES DRAWN AEROBIC AND ANAEROBIC Blood Culture adequate volume Performed at [place] [address], [place], [place] [address]    Culture   Final    NO GROWTH 5 DAYS Performed at [facility], [place] [address] [place], [place] [address]    Report Status 06/14/2020 FINAL  Final  Blood Culture (routine x 2)     Status: None   Collection Time: 05/23/2020  6:36 PM   Specimen: BLOOD LEFT HAND  Result Value Ref Range Status   Specimen Description   Final    BLOOD LEFT HAND Performed at [place] [address] [place], [place] [address]    Special Requests   Final    BOTTLES DRAWN AEROBIC AND ANAEROBIC Blood Culture adequate volume Performed at [place] [address], [place], [place] [address]    Culture   Final    NO GROWTH 5 DAYS Performed at [facility], [place] [address], [place], [place] [address]    Report Status 06/14/2020 FINAL  Final      Radiology Studies: VAS [place] UPPER EXTREMITY VENOUS DUPLEX  Result Date: 06/14/2020 UPPER VENOUS STUDY  Indications: IV team noticed left brachial vein would not compress, Covid-19 Limitations: Body habitus. Comparison Study: No prior study on file for comparison Performing  Technologist: Sharion Dove RVS  Examination Guidelines: A complete evaluation includes B-mode imaging, spectral Doppler, color Doppler, and power Doppler as needed of all accessible portions of each vessel. Bilateral testing is considered an integral part of a complete examination. Limited examinations for reoccurring indications may be performed as noted.  Right Findings: +----------+------------+---------+-----------+----------+-------+ RIGHT  CompressiblePhasicitySpontaneousPropertiesSummary +----------+------------+---------+-----------+----------+-------+ Subclavian               Yes       Yes                      +----------+------------+---------+-----------+----------+-------+  Left Findings: +----------+------------+---------+-----------+----------+--------+ LEFT      CompressiblePhasicitySpontaneousPropertiesSummary  +----------+------------+---------+-----------+----------+--------+ IJV           Full       Yes       Yes                       +----------+------------+---------+-----------+----------+--------+ Subclavian    Full       Yes       Yes                       +----------+------------+---------+-----------+----------+--------+ Axillary      Full       Yes       Yes                       +----------+------------+---------+-----------+----------+--------+ Brachial      Full       Yes       Yes              tortuous +----------+------------+---------+-----------+----------+--------+ Cephalic      Full                                           +----------+------------+---------+-----------+----------+--------+ Basilic       Full                                           +----------+------------+---------+-----------+----------+--------+  Summary:  Right: No evidence of thrombosis in the subclavian.  Left: No evidence of deep vein thrombosis in the upper extremity. No evidence of superficial vein thrombosis in the upper extremity.   *See table(s) above for measurements and observations.    Preliminary     Scheduled Meds: . cholecalciferol  2,000 Units Oral Daily  . enoxaparin (LOVENOX) injection  40 mg Subcutaneous QHS  . furosemide  40 mg Intravenous Daily  . levothyroxine  75 mcg Oral Q0600  . methylPREDNISolone (SOLU-MEDROL) injection  60 mg Intravenous Daily   Continuous Infusions:   LOS: 5 days   Time spent: 35 minutes.  Patrecia Pour, MD [facility] [email or web address] 06/14/2020, 8:15 PM

## 2020-06-14 NOTE — Progress Notes (Signed)
Called to room a to check patient's oxygen saturations. Informed by Charge nurse that MD had ordered [place].  Pt placed on HHFNC  30 lpm and 100%  Oxygen saturation 92% and patient given a flutter device.  Pt instructed and coach thru several breaths and encourage to cough.  Strong non productive cough.

## 2020-06-14 NOTE — Progress Notes (Signed)
Physical Therapy Treatment Patient Details Name: Kristina Ramos MRN: [id number] DOB: 08-08-1941 Today's Date: 06/14/2020    History of Present Illness 79 y.o. female with medical history significant of follicular lymphoma, dyslipidemia, hypertension, hypothyroid, arthritis and depression. Admitted 6/7-6/11 with COVID. Discharged home on 2L 02. Readmitted 6/19 with lethargy, SOB and increased 02 demand.      PT Comments    Pt urgently requesting restroom upon arrival, impulsively rises from chair and disconnects HFNC 30L while ambulating to restroom, therapist attempting to educate pt on safety wit lines, safe speed, need for supplemental O2. Therapist able to reconnect and RN enters room to confirm correct while pt voids bladder on toilet. Pt fatigues with mobilizing around the room, cued for pursed lip breathing and safe speed with mobility. Pt brings BLE back into bed with momentum and able to reposition in bed without physical assist. Pt desat to 74%, but able to recover to 90s with seated rest and pursed lip breathing. Therapist educated pt on lying prone or sidelying, exercising BLE when able in bed and pt verbalized she has been lying on her side and moving her BLE while in the room. Pt too fatigued for exercise once back in bed; [place] entering room at EOS. Patient will benefit from continued physical therapy in hospital and recommendations below to increase strength, balance, endurance for safe ADLs and gait.   Follow Up Recommendations  Home health PT     Equipment Recommendations  None recommended by PT    Recommendations for Other Services       Precautions / Restrictions Precautions Precautions: Fall Precaution Comments: monitor O2 Restrictions Weight Bearing Restrictions: No    Mobility  Bed Mobility Overal bed mobility: Needs Assistance Bed Mobility: Sit to Supine    Sit to supine: Min guard  General bed mobility comments: uses momentum to bring BLE up into bed, no  physical assist  Transfers Overall transfer level: Needs assistance Equipment used: Rolling walker (2 wheeled) Transfers: Sit to/from [employer] Sit to Stand: Supervision Stand pivot transfers: Supervision  General transfer comment: cues for safety with O2 tubing, good steadiness upon standing, slightly labored  Ambulation/Gait Ambulation/Gait assistance: Min guard Gait Distance (Feet): 20 Feet Assistive device: Rolling walker (2 wheeled) Gait Pattern/deviations: Step-through pattern;Decreased stride length  General Gait Details: steady gait around room and to bathroom using RW; initially impulsive trying to get around room and disconnecting supplemental O2, cued for puresd lip breathing and safety with lines, therapist able to reattach and RN confirm correct; moderate increase work of breathing (appeared 2/4), on HFNC 30L with constant cues for pursed lip breathing, SpO2 desat to 74% once, but able to recover with seated rest and pursed lip breathing   Stairs             Wheelchair Mobility    Modified Rankin (Stroke Patients Only)       Balance Overall balance assessment: Needs assistance Sitting-balance support: Feet supported;No upper extremity supported Sitting balance-Leahy Scale: Good Sitting balance - Comments: seated EOB   Standing balance support: During functional activity;Bilateral upper extremity supported Standing balance-Leahy Scale: Fair Standing balance comment: with RW       Cognition Arousal/Alertness: Awake/alert Behavior During Therapy: WFL for tasks assessed/performed Overall Cognitive Status: Within Functional Limits for tasks assessed         Exercises      General Comments        Pertinent Vitals/Pain Pain Assessment: No/denies pain    Home Living  Prior Function            PT Goals (current goals can now be found in the care plan section) Acute Rehab PT Goals PT Goal  Formulation: With patient Time For Goal Achievement: 06/25/20 Potential to Achieve Goals: Good Progress towards PT goals: Progressing toward goals    Frequency    Min 3X/week      PT Plan Current plan remains appropriate    Co-evaluation              AM-PAC PT "6 Clicks" Mobility   Outcome Measure  Help needed turning from your back to your side while in a flat bed without using bedrails?: A Little Help needed moving from lying on your back to sitting on the side of a flat bed without using bedrails?: A Little Help needed moving to and from a bed to a chair (including a wheelchair)?: A Little Help needed standing up from a chair using your arms (e.g., wheelchair or bedside chair)?: A Little Help needed to walk in hospital room?: A Little Help needed climbing 3-5 steps with a railing? : A Little 6 Click Score: 18    End of Session Equipment Utilized During Treatment: Oxygen Activity Tolerance: Patient limited by fatigue Patient left: in bed;with bed alarm set;with nursing/sitter in room Nurse Communication: Mobility status;Other (comment) (RN to confirm HFNC) PT Visit Diagnosis: Other abnormalities of gait and mobility (R26.89)     Time: 8413-2440 PT Time Calculation (min) (ACUTE ONLY): 14 min  Charges:  $Therapeutic Activity: 8-22 mins                      Tori Ardon Franklin PT, DPT 06/14/20, 1:52 PM

## 2020-06-15 ENCOUNTER — Inpatient Hospital Stay (HOSPITAL_COMMUNITY): Payer: Medicare Other

## 2020-06-15 DIAGNOSIS — J9621 Acute and chronic respiratory failure with hypoxia: Secondary | ICD-10-CM

## 2020-06-15 LAB — RESPIRATORY PANEL BY PCR

## 2020-06-15 LAB — PROCALCITONIN: Procalcitonin: <0.1 ng/mL

## 2020-06-15 LAB — CBC WITH DIFFERENTIAL/PLATELET
Abs Immature Granulocytes: 0.09 10*3/uL — ABNORMAL HIGH (ref 0.00–0.07)
Basophils Absolute: 0 10*3/uL (ref 0.0–0.1)
Basophils Relative: 0 %
Eosinophils Absolute: 0 10*3/uL (ref 0.0–0.5)
Eosinophils Relative: 0 %
HCT: 28.3 % — ABNORMAL LOW (ref 36.0–46.0)
Hemoglobin: 9.5 g/dL — ABNORMAL LOW (ref 12.0–15.0)
Immature Granulocytes: 2 %
Lymphocytes Relative: 6 %
Lymphs Abs: 0.3 10*3/uL — ABNORMAL LOW (ref 0.7–4.0)
MCH: 28.9 pg (ref 26.0–34.0)
MCHC: 33.6 g/dL (ref 30.0–36.0)
MCV: 86 fL (ref 80.0–100.0)
Monocytes Absolute: 0.1 10*3/uL (ref 0.1–1.0)
Monocytes Relative: 3 %
Neutro Abs: 4.2 10*3/uL (ref 1.7–7.7)
Neutrophils Relative %: 89 %
Platelets: 138 10*3/uL — ABNORMAL LOW (ref 150–400)
RBC: 3.29 MIL/uL — ABNORMAL LOW (ref 3.87–5.11)
RDW: 13.3 % (ref 11.5–15.5)
WBC: 4.7 10*3/uL (ref 4.0–10.5)
nRBC: 0 % (ref 0.0–0.2)

## 2020-06-15 LAB — C-REACTIVE PROTEIN: CRP: 6.9 mg/dL — ABNORMAL HIGH (ref <1.0)

## 2020-06-15 LAB — D-DIMER, QUANTITATIVE: D-Dimer, Quant: 2.25 ug/mL-FEU — ABNORMAL HIGH (ref 0.00–0.50)

## 2020-06-15 NOTE — Consult Note (Addendum)
NAME:  Kristina Ramos, MRN:  [id number], DOB:  [birth year], LOS: 6 ADMISSION DATE:  06/01/2020, CONSULTATION DATE:  06/15/2020 REFERRING MD: Bonner Puna , CHIEF COMPLAINT:   Acute on chronic respiratory failure with hypoxia in setting of Covid Pneumonitis  Brief History   Kristina Ramos is a 79 y.o. female with a history of follicular lymphoma (had her 6th cycle of maintenance Gazyva on 04/03/20)  , HTN, hypothyroidism, iron deficiency, and hospitalization 6/7-6/11 for covid-19 pneumonia. She was  discharged on decadron requiring 2L O2 having completed remdesivir. She completed decadron 6/16 and returned to ED 6/19 due to worsening dyspnea, lethargy. She was  found to be febrile to 101.15F with 4L O2 requirement. Pt. Has had increasing oxygen requirements over the last 24 hours, with escalation to [place] at  30 L/ Min. Pt. Required transfer to SDU 6/25 for monitoring of respiratory status . PCCM have been asked to consult for assistance and support in optimizing patient's respiratory status.  History of present illness   Kristina Ramos is a 79 y.o. female with a history of follicular lymphoma, HTN, hypothyroidism, iron deficiency, and hospitalization 6/7-6/11 for covid-19 pneumonia discharged on decadron requiring 2L O2 having completed remdesivir. She completed decadron 6/16 and returned to ED 6/19 due to worsening dyspnea, lethargy, found to be febrile to 101.15F with 4L O2 requirement. Per her daughter, she started having increased dyspnea the day after Decadron was discontinued. CXR demonstrated stable bilateral infiltrates with no new focal consolidation. She was lethargic, CT head unrevealing, and hyponatremic (Na 126). She was admitted, started back on steroids with improved mentation, strength, and given lasix empirically due to persistent desaturations worse with exertion.  Pt. has had increasing oxygen requirements over the last 24 hours, with escalation to [place] at  30 L/ Min. Pt. Required transfer to SDU  6/25 for monitoring of respiratory status . PCCM have been asked to consult for assistance and support in optimizing patient's respiratory status.   Past Medical History    Past Medical History:  Diagnosis Date  . Anemia   . Arthritis   . Depression   . Follicular lymphoma grade ii, lymph nodes of multiple sites ([place]) 09/30/2018  . Follicular lymphoma grade ii, lymph nodes of multiple sites ([place]) 09/30/2018  . GERD (gastroesophageal reflux disease)   . Goals of care, counseling/discussion 09/30/2018  . Heart murmur   . Hx of adenomatous colonic polyps 07/28/2017  . Hyperlipidemia   . Hypertension   . 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 Thyroid disease     Significant Hospital Events   01/26/2020>> Pfizer SARS COV-2 vaccine #1 02/20/2020 >>Pfizer SARS COV-2 vaccine #2 04/03/2020>> 6th cycle of maintenance Gazyva   6/7-6/11 Admission for Covid Pneumonia 6/11 Discharged home on decadron 06/06/2020 >> Re-admission for worsening dyspnea, fever, and increasing oxygen requirements 6/7-6/11 Remdesivir treatment 6/7-6/16 Decadron treatment ,  was restarted 6/19 06/15/2020>> Transfer to SDU for increasing oxygen demands, CXR with more pronounced airspace opacities / worsening atelectasis   Consults:  06/15/2020 >> CCM consulted   Procedures:    Significant Diagnostic Tests:  CXR 6/25 Diffuse heterogeneous airspace opacities slightly more pronounced than on the most recent comparison examination though some of this may be due to worsening atelectasis.  Micro Data:  05/28/2020>>  SARS Coronavirus 2 NEGATIVE POSITIVEAbnormal   06/20/2020 Urine Culture>> No Growth 6/19 Blood Culture>> No Growth 5 days  Antimicrobials:  6/19>> Vanc 2 grams x 1 dose 6/19-6/20>> Cefepime   Interim history/subjective:  Currently  on 30 L HHFNC ( 60%) Sats are 96%, RR is 18 Pt. Was speaking in full sentences  on phone and in no distress when I examined her. She desaturates with  minimal exertion, and takes about 10 minutes to rebound to sats > 92% She " feels" when she is hypoxic.  WBC is 4.7 Procalcitonin is < 0.10 Na is 134 ( Up from 126 on admission )  CRP has up trended within the last 24 hours from 3.6 to 6.9 D-dimer down trended to  2.25 ( was 2.55 on  6/24) Oxygen demands have increased to 30 L HHFNC ( 60%) last 24 hours CXR 6/25 shows increase in airspace opacities , more pronounced and worsening atelectasis Net + 1255 since admission Good response to Lasix 40 today, with 2600 cc's out this am    Objective   Blood pressure (!) 150/61, pulse 91, temperature 97.6 F (36.4 C), temperature source Oral, resp. rate 20, height 5\' 4"  (1.626 m), weight 99.8 kg, SpO2 96 %.    FiO2 (%):  [60 %-80 %] 70 %   Intake/Output Summary (Last 24 hours) at 06/15/2020 1347 Last data filed at 06/15/2020 1000 Gross per 24 hour  Intake 360 ml  Output --  Net 360 ml   Filed Weights   06/08/2020 1807  Weight: 99.8 kg    Examination: General: Awake and alert, elderly female OOB in chair, on [place] in NAD HENT: NCAT, MM Pink and Moist, No LAD, No JVD  Lungs: Bilateral chest excursion, Crackles throughout, diminished per bases bilaterally Cardiovascular: S1, S2, RRR, No RMG, SR-ST per tele Abdomen: Soft, Non-tender, ND,BS +, Body mass index is 37.76 kg/m. Extremities: No obvious deformities, Warm and dry, no Mottling, Brisk cap refill Neuro: Awake, alert and oriented, MAE x 4, A&O x 3, appropriate GU: Not assessed  Resolved Hospital Problem list     Assessment & Plan:  Acute on Chronic Respiratory Failure 2/2 Covid 19 Pneumonia Has had lingering  hypoxemia since re-admission 06/06/2020 6/7-6/16>> Received  Decadron treatment ,was restarted 6/19 6/25 Worsening airspace opacities on CXR, Increased atelectasis Desaturates with minimal exertion, slow to rebound D dimer with slight down Trend 6/25 PCT is < 0.10, ABX not indicated Plan Titrate oxygen for sats > 92% CXR  in am and prn Continue decadron per primary team  Lasix per Primary Team >> Good diuresis 6/25  Monitor K while getting lasix OOB to chair Aggressive Pulmonary Toilet as patient can tolerate.  OOB to chair PT/OT once stablized IS Q 1 hour while awake ( Atelectasis per CXR) ABG with mental status changes and prn  Trend CRP and d-dimer  Consider CTA to RO PE should patient continue to decline, or if there is an increase in d-dimer, CRP Continue isolation through 6/28 ( 21 days after diagnosis) Close follow up in Pulmonary Office as an outpatient to follow until CXR clears  Relative Adrenal Insufficiency Global symptomatic Improvement with re-initiation of Steroids Plan Continue steroids , with slow prolonged taper  Follicular Lymphoma Due for next round of Gazyva 06/2020 Plan May need to consider postponing next dose of chemo until dyspnea has improved and CXR has cleared   Rest per Primary Team    Best practice:  Diet: Per Primary Team Pain/Anxiety/Delirium protocol (if indicated): NA VAP protocol (if indicated): NA DVT prophylaxis: Lovenox GI prophylaxis: Per Primary Team Glucose control: CBG Mobility: OOB to chair Code Status: Full Family Communication: No family at bedside, patient updated in full 6/25 Disposition: Step Down  Labs   CBC: Recent Labs  Lab 05/28/2020 1834 06/04/2020 1834 06/11/20 0610 06/12/20 0442 06/13/20 1048 06/14/20 0454 06/15/20 0156  WBC 7.8   < > 7.0 3.2* 7.9 5.9 4.7  NEUTROABS 6.7  --   --   --   --  5.4 4.2  HGB 10.6*   < > 9.8* 9.1* 10.9* 9.4* 9.5*  HCT 29.4*   < > 28.9* 27.1* 32.2* 27.3* 28.3*  MCV 83.5   < > 85.5 87.1 86.3 85.0 86.0  PLT 125*   < > 114* 109* 148* 126* 138*   < > = values in this interval not displayed.    Basic Metabolic Panel: Recent Labs  Lab 06/04/2020 1834 06/11/20 0610 06/12/20 0442 06/13/20 0519 06/14/20 0454  NA 126* 135 132* 136 134*  K 3.4* 3.9 4.5 5.1 4.7  CL 90* 102 100 99 101  CO2 24 25 25 26  24   GLUCOSE 122* 128* 127* 111* 108*  BUN 14 13 10 12 13   CREATININE 0.81 0.70 0.62 0.64 0.57  CALCIUM 7.8* 8.2* 8.1* 8.6* 8.2*  MG  --  1.7  --   --  1.7  PHOS  --  2.7  --   --   --    GFR: Estimated Creatinine Clearance: 66.5 mL/min (by C-G formula based on SCr of 0.57 mg/dL). Recent Labs  Lab 06/20/2020 1833 06/20/2020 1834 06/10/20 0504 06/11/20 0610 06/12/20 0442 06/13/20 1048 06/14/20 0454 06/15/20 0156  PROCALCITON  --   --  <0.10  --   --   --   --  <0.10  WBC  --    < >  --    < > 3.2* 7.9 5.9 4.7  LATICACIDVEN 0.8  --   --   --   --   --   --   --    < > = values in this interval not displayed.    Liver Function Tests: Recent Labs  Lab 06/07/2020 1834 06/11/20 0610 06/12/20 0442 06/14/20 0454  AST 41 22 16 17   ALT 27 22 17 16   ALKPHOS 45 40 41 39  BILITOT 0.9 0.4 0.5 1.1  PROT 5.6* 5.2* 5.1* 5.2*  ALBUMIN 2.6* 2.5* 2.4* 2.5*   No results for input(s): LIPASE, AMYLASE in the last 168 hours. No results for input(s): AMMONIA in the last 168 hours.  ABG    Component Value Date/Time   HCO3 27.1 06/08/2020 1835   O2SAT 93.3 05/30/2020 1835     Coagulation Profile: Recent Labs  Lab 06/01/2020 1834  INR 1.1    Cardiac Enzymes: No results for input(s): CKTOTAL, CKMB, CKMBINDEX, TROPONINI in the last 168 hours.  HbA1C: No results found for: HGBA1C  CBG: No results for input(s): GLUCAP in the last 168 hours.  Review of Systems:   Gen: Denies fever, chills, weight change,+  fatigue, night sweats HEENT: Denies blurred vision, double vision, hearing loss, tinnitus, sinus congestion, rhinorrhea, sore throat, neck stiffness, dysphagia PULM: + shortness of breath, + DOE,  No cough, sputum production, hemoptysis, wheezing CV: Denies chest pain, edema, orthopnea, paroxysmal nocturnal dyspnea, palpitations GI: Denies abdominal pain, nausea, vomiting, diarrhea, hematochezia, melena, constipation, change in bowel habits GU: Denies dysuria, hematuria, polyuria,  oliguria, urethral discharge Endocrine: Denies hot or cold intolerance, polyuria, polyphagia or appetite change Derm: Denies rash, dry skin, scaling or peeling skin change Heme: Denies easy bruising, bleeding, bleeding gums Neuro: Denies headache, numbness, weakness, slurred speech, loss of memory or consciousness  Past  Medical History  She,  has a past medical history of Anemia, Arthritis, Depression, Follicular lymphoma grade ii, lymph nodes of multiple sites ([place]) (83/38/2505), Follicular lymphoma grade ii, lymph nodes of multiple sites ([place]) (09/30/2018), GERD (gastroesophageal reflux disease), Goals of care, counseling/discussion (09/30/2018), Heart murmur, adenomatous colonic polyps (07/28/2017), Hyperlipidemia, Hypertension, Malignant lymphoma, 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   Surgical History    Past Surgical History:  Procedure Laterality Date  . TONSILLECTOMY    . TOTAL KNEE ARTHROPLASTY Left 04/25/2014  . TUBAL LIGATION       Social History   reports that she has never smoked. She has never used smokeless tobacco. She reports that she does not drink alcohol and does not use drugs.   Family History   Her family history includes CAD in her mother; Cataracts in her mother; Diabetes in her maternal grandmother and paternal aunt; Heart disease in her father; Lung cancer in her father; Thyroid disease in her maternal aunt. There is no history of Colon cancer, Esophageal cancer, Pancreatic cancer, Rectal cancer, or Stomach cancer.   Allergies Allergies  Allergen Reactions  . Aspirin Other (See Comments)    REACTION: pt states that it irritates her stomach, coated ASA is ok     Home Medications  Prior to Admission medications   Medication Sig Start Date End Date Taking? Authorizing Provider  amoxicillin (AMOXIL) 500 MG capsule Take 2,000 mg by mouth as needed. only for dental procedeure   Yes [provider]  Cholecalciferol (VITAMIN D) 2000  units CAPS Take 2,000 Units by mouth daily.    Yes [provider]  dexamethasone (DECADRON) 6 MG tablet Take 1 tablet (6 mg total) by mouth daily. 06/01/20  Yes Patrecia Pour, MD  famciclovir (FAMVIR) 250 MG tablet TAKE 1 TABLET BY MOUTH EVERY DAY Patient taking differently: Take 250 mg by mouth daily.  09/08/19  Yes Ennever, Rudell Cobb, MD  hydrochlorothiazide (HYDRODIURIL) 12.5 MG tablet TAKE 1 TABLET BY MOUTH EVERY DAY Patient taking differently: Take 12.5 mg by mouth daily.  01/11/20  Yes Ann Held, DO  levothyroxine (SYNTHROID) 75 MCG tablet Take 1 tablet (75 mcg total) by mouth daily. 01/25/20  Yes Roma Schanz R, DO  lisinopril (ZESTRIL) 10 MG tablet TAKE 1 TABLET BY MOUTH EVERY DAY Patient taking differently: Take 10 mg by mouth daily.  01/11/20  Yes Roma Schanz R, DO  loperamide (IMODIUM) 2 MG capsule Take 1 capsule (2 mg total) by mouth as needed for diarrhea or loose stools. 06/01/20  Yes Patrecia Pour, MD     Critical care time: 55 minutes     Magdalen Spatz, MSN, AGACNP-BC [place] for personal pager PCCM on call pager [phone] 06/15/2020 1:47 PM

## 2020-06-15 NOTE — Progress Notes (Signed)
Occupational Therapy Treatment Patient Details Name: Kristina Ramos MRN: [id number] DOB: 1941/04/25 Today's Date: 06/15/2020    History of present illness 79 y.o. female with medical history significant of follicular lymphoma, dyslipidemia, hypertension, hypothyroid, arthritis and depression. Admitted 6/7-6/11 with COVID. Discharged home on 2L 02. Readmitted 6/19 with lethargy, SOB and increased 02 demand.     OT comments  Treatment focused on improving and maintaining patient's activity tolerance. Patient able to perform transfer to side of bed and then to recliner where she performed grooming task. Patient on 25 L HFNC 70% FIO2 and required NRB at 15 liters to maintain oxygen sats into high 80s and low 90s. Patient's o2 sat did drop to 70% briefly after transfer to recliner but rebounded quickly with verbal cues for pursed lipped breathing. Patient reporting feeling short of breath and having bouts of coughing. Rn in room to monitor sats w/ activity. Cont POC.   Follow Up Recommendations  No OT follow up    Equipment Recommendations  Tub/shower seat    Recommendations for Other Services      Precautions / Restrictions Precautions Precautions: Fall Precaution Comments: monitor O2, HFNC & NRB with activity Restrictions Weight Bearing Restrictions: No       Mobility Bed Mobility               General bed mobility comments: Supervision to transfer to side of bed. Therapist assisted with lines/leads.  Transfers Overall transfer level: Needs assistance   Transfers: Sit to/from Stand;Stand Pivot Transfers Sit to Stand: Min guard         General transfer comment: min guard to pivot to recliner. Limited by dropping o2 sats for further activity.    Balance                                           ADL either performed or assessed with clinical judgement   ADL   Eating/Feeding: Independent;Sitting;Set up   Grooming: Wash/dry hands;Wash/dry  face;Sitting (Required rest break between washing hands and washing face to allow o2 sat to recover.)                                       Vision       Perception     Praxis      Cognition Arousal/Alertness: Awake/alert Behavior During Therapy: WFL for tasks assessed/performed Overall Cognitive Status: Within Functional Limits for tasks assessed                                          Exercises     Shoulder Instructions       General Comments      Pertinent Vitals/ Pain       Pain Assessment: No/denies pain  Home Living                                          Prior Functioning/Environment              Frequency  Min 2X/week        Progress Toward Goals  OT Goals(current goals  can now be found in the care plan section)  Progress towards OT goals: Progressing toward goals  Acute Rehab OT Goals OT Goal Formulation: With patient Time For Goal Achievement: 06/25/20 Potential to Achieve Goals: Good  Plan Discharge plan remains appropriate    Co-evaluation          OT goals addressed during session: ADL's and self-care (activity tolerance)      AM-PAC OT "6 Clicks" Daily Activity     Outcome Measure   Help from another person eating meals?: A Little Help from another person taking care of personal grooming?: A Little Help from another person toileting, which includes using toliet, bedpan, or urinal?: A Little Help from another person bathing (including washing, rinsing, drying)?: A Little Help from another person to put on and taking off regular upper body clothing?: A Little Help from another person to put on and taking off regular lower body clothing?: A Little 6 Click Score: 18    End of Session Equipment Utilized During Treatment: Rolling walker;Oxygen  OT Visit Diagnosis: Unsteadiness on feet (R26.81);Other (comment)   Activity Tolerance Patient limited by fatigue   Patient Left in  chair;with call bell/phone within reach;with chair alarm set   Nurse Communication  (o2 sats)        Time: 7169-6789 OT Time Calculation (min): 21 min  Charges: OT General Charges $OT Visit: 1 Visit OT Treatments $Therapeutic Activity: 8-22 mins  Derl Barrow, OTR/L [place]  Office [phone] Pager: [place] 06/15/2020, 12:47 PM

## 2020-06-15 NOTE — Progress Notes (Signed)
Patient assisted from bed to chair/bsc with RN and OT assistance. Each time desaturated to 72% on 30L 70% oxygen. Non-rebreather mask applied. Slowly returned to 88-90% after five minutes of slow deep breathing. Will encourage incentive spirometer and flutter use while up in the chair. Will continue to monitor and wean as able.

## 2020-06-15 NOTE — Progress Notes (Signed)
PROGRESS NOTE  Loretto Belinsky Carbonell  WNI:[id number] DOB: 1941-01-09 DOA: 05/25/2020 PCP: Ann Held, DO   Brief Narrative: Saphronia Ozdemir is a 79 y.o. female with a history of follicular lymphoma, HTN, hypothyroidism, iron deficiency, and hospitalization 6/7-6/11 for covid-19 pneumonia discharged on decadron requiring 2L O2 having completed remdesivir. She completed decadron 6/16 and returned to ED 6/19 due to worsening dyspnea, lethargy, found to be febrile to 101.74F with 4L O2 requirement. CXR demonstrated stable bilateral infiltrates with no new focal consolidation. She was lethargic, CT head unrevealing, and hyponatremic (Na 126). She was admitted, started back on steroids with improved mentation, strength, and given lasix empirically due to persistent desaturations worse with exertion. Hypoxemia worsened and she was transferred to SDU on [facility] 6/24 with subsequent PCCM consultation 6/25.   Assessment & Plan: Principal Problem:   Acute on chronic respiratory failure with hypoxia (HCC) Active Problems:   Hypothyroidism   Essential hypertension, benign   Follicular lymphoma grade ii, lymph nodes of multiple sites ([place])   Hyponatremia  Acute on chronic hypoxic respiratory due to covid-19 pneumonia: Remains hypoxemic since recent admission. CRP up to 13.9 on 6/20 after declining 11.4 >> 3.6 at discharge. Of note, pt had been fully immunized prior to contracting covid.  - Hypoxemia is severe, though no current respiratory distress. Given atypical features of covid-19, specifically severe disease in immunized patient and worsening later in disease course (>2 weeks), will check broader viral panel and ask for pulmonology evaluation and recommendations.  - s/p remesivir 6/7 - 6/11 - s/p decadron 6/7 - 6/16, restarted 6/19 >> - CRP back up on 6/25 though PCT remains negative.  - Continue isolation x21 days from positive test (6/7) - Continue supplemental oxygen, recommend proning if possible.  Atelectasis likely also contributing, so IS and OOB encouraged.  - Received vancomycin, cefepime, though no focal consolidation consistent with HCAP, PCT was negative. No further abx planned at this time, though will monitor closely for this complication.  - D-dimer remains stable without acute rise and exam remains consistent with covid pneumonia. Will continue trending and plan CTA chest if no improvement and consistently elevated d-dimer despite improvement in CRP. - Continue gentle trial of diuresis to optimize oxygenation. Recheck BMP in AM.  Relative adrenal insufficiency: Significant global symptomatic improvement with reinitiation of steroids.  - Will continue steroids with anticipated prolonged taper.  Pancytopenia: Lymphopenia on admission with normocytic anemia and thrombocytopenia. Likely related to viral infection with hemodilutional process with IVF. Splenomegaly related to lymphoma likely contributing. - Monitoring. Stable.  Hyponatremia: ?related to relative AI.  - Continue monitoring with lasix.   Hypokalemia: Supplemented.  - Monitoring  Follicular lymphoma: follows with Dr. 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  Obesity: Estimated body mass index is 37.76 kg/m as calculated from the following:   Height as of this encounter: 5\' 4"  (1.626 m).   Weight as of this encounter: 99.8 kg.  DVT prophylaxis: Lovenox Code Status: Full Family Communication: Daughter by phone daily Disposition Plan: Remain in SDU on heated high flow oxygen.  Status is: Inpatient  Remains inpatient appropriate because:Inpatient level of care appropriate due to severity of illness and severe hypoxemia.   Dispo: The patient is from: Home  Anticipated d/c  is to: TBD              Anticipated d/c date is: > 3 days              Patient currently is not medically stable to d/c.  Consultants:   Pulmonology 6/25  Procedures:   None  Antimicrobials:  Vancomycin, cefepime x1   Subjective: Oxygen saturations have remained low, worse with exertion which brings on severe, constant shortness of breath that take several minutes to recover from. This appears worse when witnessed, though the patient reports she feels overall better today than yesterday.   Objective: Vitals:   06/15/20 1050 06/15/20 1129 06/15/20 1200 06/15/20 1230  BP:   (!) 150/61   Pulse: (!) 101 91 100 91  Resp: (!) 34 (!) 30 (!) 31 20  Temp:   97.6 F (36.4 C)   TempSrc:   Oral   SpO2: 95% 95% 90% 96%  Weight:      Height:        Intake/Output Summary (Last 24 hours) at 06/15/2020 1339 Last data filed at 06/15/2020 1000 Gross per 24 hour  Intake 360 ml  Output --  Net 360 ml   Filed Weights   06/12/2020 1807  Weight: 99.8 kg   Gen: 79 y.o. female in no distress Pulm: Labored with long sentences. At rest, nonlabored, no accessory muscle use, mildly tachypneic, crackles bilaterally. CV: Regular rate and rhythm. No murmur, rub, or gallop. No JVD, no dependent edema. GI: Abdomen soft, non-tender, non-distended, with normoactive bowel sounds.  Ext: Warm, no deformities Skin: No rashes, lesions or ulcers on visualized skin. Neuro: Alert and oriented. No focal neurological deficits. Psych: Judgement and insight appear fair. Mood euthymic & affect congruent. Behavior is appropriate.    Data Reviewed: I have personally reviewed following labs and imaging studies  CBC: Recent Labs  Lab 05/22/2020 1834 06/06/2020 1834 06/11/20 0610 06/12/20 0442 06/13/20 1048 06/14/20 0454 06/15/20 0156  WBC 7.8   < > 7.0 3.2* 7.9 5.9 4.7  NEUTROABS 6.7  --   --   --   --  5.4 4.2  HGB 10.6*   < > 9.8* 9.1* 10.9* 9.4* 9.5*  HCT 29.4*   < > 28.9* 27.1* 32.2* 27.3* 28.3*  MCV 83.5    < > 85.5 87.1 86.3 85.0 86.0  PLT 125*   < > 114* 109* 148* 126* 138*   < > = values in this interval not displayed.   Basic Metabolic Panel: Recent Labs  Lab 05/22/2020 1834 06/11/20 0610 06/12/20 0442 06/13/20 0519 06/14/20 0454  NA 126* 135 132* 136 134*  K 3.4* 3.9 4.5 5.1 4.7  CL 90* 102 100 99 101  CO2 24 25 25 26 24   GLUCOSE 122* 128* 127* 111* 108*  BUN 14 13 10 12 13   CREATININE 0.81 0.70 0.62 0.64 0.57  CALCIUM 7.8* 8.2* 8.1* 8.6* 8.2*  MG  --  1.7  --   --  1.7  PHOS  --  2.7  --   --   --    GFR: Estimated Creatinine Clearance: 66.5 mL/min (by C-G formula based on SCr of 0.57 mg/dL). Liver Function Tests: Recent Labs  Lab 05/22/2020 1834 06/11/20 0610 06/12/20 0442 06/14/20 0454  AST 41 22 16 17   ALT 27 22 17 16   ALKPHOS 45 40 41 39  BILITOT 0.9 0.4 0.5 1.1  PROT 5.6* 5.2* 5.1* 5.2*  ALBUMIN 2.6* 2.5* 2.4* 2.5*  No results for input(s): LIPASE, AMYLASE in the last 168 hours. No results for input(s): AMMONIA in the last 168 hours. Coagulation Profile: Recent Labs  Lab 05/31/2020 1834  INR 1.1   Cardiac Enzymes: No results for input(s): CKTOTAL, CKMB, CKMBINDEX, TROPONINI in the last 168 hours. BNP (last 3 results) No results for input(s): PROBNP in the last 8760 hours. HbA1C: No results for input(s): HGBA1C in the last 72 hours. CBG: No results for input(s): GLUCAP in the last 168 hours. Lipid Profile: No results for input(s): CHOL, HDL, LDLCALC, TRIG, CHOLHDL, LDLDIRECT in the last 72 hours. Thyroid Function Tests: No results for input(s): TSH, T4TOTAL, FREET4, T3FREE, THYROIDAB in the last 72 hours. Anemia Panel: No results for input(s): VITAMINB12, FOLATE, FERRITIN, TIBC, IRON, RETICCTPCT in the last 72 hours. Urine analysis:    Component Value Date/Time   COLORURINE YELLOW 05/27/2020 1812   APPEARANCEUR CLEAR 06/01/2020 1812   LABSPEC 1.015 06/06/2020 1812   PHURINE 6.0 06/10/2020 1812   GLUCOSEU NEGATIVE 05/31/2020 1812   HGBUR  NEGATIVE 06/17/2020 1812   BILIRUBINUR NEGATIVE 05/24/2020 1812   KETONESUR NEGATIVE 06/19/2020 1812   PROTEINUR NEGATIVE 06/07/2020 1812   NITRITE NEGATIVE 06/10/2020 1812   LEUKOCYTESUR NEGATIVE 05/30/2020 1812   Recent Results (from the past 240 hour(s))  Urine culture     Status: None   Collection Time: 06/05/2020  6:12 PM   Specimen: In/Out Cath Urine  Result Value Ref Range Status   Specimen Description   Final    IN/OUT CATH URINE Performed at [facility], [place] [address], [place], [place] [address]    Special Requests   Final    NONE Performed at [facility], [place] [address] [place], [place] [address]    Culture   Final    NO GROWTH Performed at [facility], [place] [address] [place], [place] [address]    Report Status 06/11/2020 FINAL  Final  Blood Culture (routine x 2)     Status: None   Collection Time: 06/18/2020  6:34 PM   Specimen: BLOOD  Result Value Ref Range Status   Specimen Description   Final    BLOOD LEFT ANTECUBITAL Performed at [facility] [address], [place], [place] [address]    Special Requests   Final    BOTTLES DRAWN AEROBIC AND ANAEROBIC Blood Culture adequate volume Performed at [facility] [address] [place], [place] [address]    Culture   Final    NO GROWTH 5 DAYS Performed at [facility], [place] [address], [place], [place] [address]    Report Status 06/14/2020 FINAL  Final  Blood Culture (routine x 2)     Status: None   Collection Time: 06/16/2020  6:36 PM   Specimen: BLOOD LEFT HAND  Result Value Ref Range Status   Specimen Description   Final    BLOOD LEFT HAND Performed at [facility] [address], [place], [place] [address]    Special Requests   Final    BOTTLES DRAWN AEROBIC AND ANAEROBIC Blood Culture adequate volume Performed at [facility] [address], [place],  [address]     Culture   Final    NO GROWTH 5 DAYS Performed at [facility], [place] [address], [place],  [address]    Report Status 06/14/2020 FINAL  Final      Radiology Studies: DG CHEST PORT 1 VIEW  Result Date: 06/15/2020 CLINICAL DATA:  COVID-19 EXAM: PORTABLE CHEST 1 VIEW COMPARISON:  Radiograph 06/11/2020 FINDINGS: Diffuse heterogeneous airspace opacities present slightly more pronounced than on the most recent comparison examination though some of this may be due to diminished lung volumes and worsening atelectatic change. No pneumothorax or visible effusions. Cardiomediastinal contours are partially obscured though essentially unchanged from the comparison study accounting for differences in inflation. No acute osseous or soft tissue abnormality. Telemetry leads overlie the chest. IMPRESSION: Diffuse heterogeneous airspace opacities slightly more pronounced than on the most recent comparison examination though some of this may be due to worsening atelectasis. Electronically Signed   By: Lovena Le M.D.   On: 06/15/2020 05:35   VAS [place] UPPER EXTREMITY VENOUS DUPLEX  Result Date: 06/14/2020 UPPER VENOUS STUDY  Indications: IV team noticed left brachial vein would not compress, Covid-19 Limitations: Body habitus. Comparison Study: No prior study on file for comparison Performing Technologist: Sharion Dove RVS  Examination Guidelines: A complete evaluation includes B-mode imaging, spectral Doppler, color Doppler, and power Doppler as needed of all accessible portions of each vessel. Bilateral testing is considered an integral part of a complete examination. Limited examinations for reoccurring indications may be performed as noted.  Right Findings: +----------+------------+---------+-----------+----------+-------+ RIGHT     CompressiblePhasicitySpontaneousPropertiesSummary +----------+------------+---------+-----------+----------+-------+ Subclavian               Yes       Yes                       +----------+------------+---------+-----------+----------+-------+  Left Findings: +----------+------------+---------+-----------+----------+--------+ LEFT      CompressiblePhasicitySpontaneousPropertiesSummary  +----------+------------+---------+-----------+----------+--------+ IJV           Full       Yes       Yes                       +----------+------------+---------+-----------+----------+--------+ Subclavian    Full       Yes       Yes                       +----------+------------+---------+-----------+----------+--------+ Axillary      Full       Yes       Yes                       +----------+------------+---------+-----------+----------+--------+ Brachial      Full       Yes       Yes              tortuous +----------+------------+---------+-----------+----------+--------+ Cephalic      Full                                           +----------+------------+---------+-----------+----------+--------+ Basilic       Full                                           +----------+------------+---------+-----------+----------+--------+  Summary:  Right: No evidence of thrombosis in the subclavian.  Left: No evidence of deep vein thrombosis in the upper extremity. No evidence of superficial vein thrombosis in the upper extremity.  *See table(s) above for measurements and observations.  Diagnosing physician: Servando Snare MD Electronically  signed by Servando Snare MD on 06/14/2020 at 9:03:18 PM.    Final     Scheduled Meds: . Chlorhexidine Gluconate Cloth  6 each Topical Daily  . cholecalciferol  2,000 Units Oral Daily  . enoxaparin (LOVENOX) injection  40 mg Subcutaneous QHS  . furosemide  40 mg Intravenous Daily  . levothyroxine  75 mcg Oral Q0600  . mouth rinse  15 mL Mouth Rinse BID  . methylPREDNISolone (SOLU-MEDROL) injection  60 mg Intravenous Daily   Continuous Infusions:   LOS: 6 days   Time spent: 35 minutes.  Patrecia Pour,  MD [facility] [email or web address] 06/15/2020, 1:39 PM

## 2020-06-16 ENCOUNTER — Encounter (HOSPITAL_COMMUNITY): Payer: Medicare Other

## 2020-06-16 ENCOUNTER — Inpatient Hospital Stay (HOSPITAL_COMMUNITY): Payer: Medicare Other

## 2020-06-16 LAB — BASIC METABOLIC PANEL
Anion gap: 13 (ref 5–15)
BUN: 19 mg/dL (ref 8–23)
CO2: 27 mmol/L (ref 22–32)
Calcium: 8.1 mg/dL — ABNORMAL LOW (ref 8.9–10.3)
Chloride: 89 mmol/L — ABNORMAL LOW (ref 98–111)
Creatinine, Ser: 0.67 mg/dL (ref 0.44–1.00)
GFR calc Af Amer: >60 mL/min (ref >60)
GFR calc non Af Amer: >60 mL/min (ref >60)
Glucose, Bld: 129 mg/dL — ABNORMAL HIGH (ref 70–99)
Potassium: 4.1 mmol/L (ref 3.5–5.1)
Sodium: 129 mmol/L — ABNORMAL LOW (ref 135–145)

## 2020-06-16 LAB — CBC WITH DIFFERENTIAL/PLATELET
Abs Immature Granulocytes: 0.18 10*3/uL — ABNORMAL HIGH (ref 0.00–0.07)
Basophils Absolute: 0 10*3/uL (ref 0.0–0.1)
Basophils Relative: 0 %
Eosinophils Absolute: 0 10*3/uL (ref 0.0–0.5)
Eosinophils Relative: 0 %
HCT: 32.5 % — ABNORMAL LOW (ref 36.0–46.0)
Hemoglobin: 11.2 g/dL — ABNORMAL LOW (ref 12.0–15.0)
Immature Granulocytes: 3 %
Lymphocytes Relative: 6 %
Lymphs Abs: 0.3 10*3/uL — ABNORMAL LOW (ref 0.7–4.0)
MCH: 28.9 pg (ref 26.0–34.0)
MCHC: 34.5 g/dL (ref 30.0–36.0)
MCV: 83.8 fL (ref 80.0–100.0)
Monocytes Absolute: 0.2 10*3/uL (ref 0.1–1.0)
Monocytes Relative: 3 %
Neutro Abs: 5 10*3/uL (ref 1.7–7.7)
Neutrophils Relative %: 88 %
Platelets: 144 10*3/uL — ABNORMAL LOW (ref 150–400)
RBC: 3.88 MIL/uL (ref 3.87–5.11)
RDW: 13.4 % (ref 11.5–15.5)
WBC: 5.8 10*3/uL (ref 4.0–10.5)
nRBC: 0 % (ref 0.0–0.2)

## 2020-06-16 LAB — STREP PNEUMONIAE URINARY ANTIGEN: Strep Pneumo Urinary Antigen: NEGATIVE

## 2020-06-16 LAB — D-DIMER, QUANTITATIVE: D-Dimer, Quant: 2.57 ug/mL-FEU — ABNORMAL HIGH (ref 0.00–0.50)

## 2020-06-16 LAB — SAR COV2 SEROLOGY (COVID19)AB(IGG),IA: SARS-CoV-2 Ab, IgG: NONREACTIVE

## 2020-06-16 LAB — C-REACTIVE PROTEIN: CRP: 5.1 mg/dL — ABNORMAL HIGH (ref <1.0)

## 2020-06-16 LAB — MRSA PCR SCREENING: MRSA by PCR: NEGATIVE

## 2020-06-16 LAB — MAGNESIUM: Magnesium: 1.7 mg/dL (ref 1.7–2.4)

## 2020-06-16 MED ORDER — METHYLPREDNISOLONE SODIUM SUCC 125 MG IJ SOLR
60.0000 mg | Freq: Two times a day (BID) | INTRAMUSCULAR | Status: DC
  Administered 2020-06-16 – 2020-06-17 (×2): 60 mg via INTRAVENOUS
  Filled 2020-06-16 (×2): qty 2

## 2020-06-16 MED ORDER — MAGNESIUM SULFATE 2 GM/50ML IV SOLN
2.0000 g | Freq: Once | INTRAVENOUS | Status: AC
  Administered 2020-06-16: 2 g via INTRAVENOUS
  Filled 2020-06-16: qty 50

## 2020-06-16 MED ORDER — ACETAMINOPHEN 325 MG PO TABS
650.0000 mg | ORAL_TABLET | ORAL | Status: DC | PRN
  Administered 2020-06-16 – 2020-06-21 (×2): 650 mg via ORAL
  Filled 2020-06-16 (×3): qty 2

## 2020-06-16 MED ORDER — BENZONATATE 100 MG PO CAPS
100.0000 mg | ORAL_CAPSULE | Freq: Two times a day (BID) | ORAL | Status: DC
  Administered 2020-06-16 – 2020-06-24 (×18): 100 mg via ORAL
  Filled 2020-06-16 (×19): qty 1

## 2020-06-16 MED ORDER — CHLORHEXIDINE GLUCONATE CLOTH 2 % EX PADS
6.0000 | MEDICATED_PAD | Freq: Every day | CUTANEOUS | Status: DC
  Administered 2020-06-16 – 2020-06-24 (×8): 6 via TOPICAL

## 2020-06-16 MED ORDER — SODIUM CHLORIDE 0.9 % IV SOLN: INTRAVENOUS | Status: DC

## 2020-06-16 NOTE — Consult Note (Signed)
NAME:  Kristina Ramos, MRN:  [id number], DOB:  03-09-1941, LOS: 7 ADMISSION DATE:  06/12/2020, CONSULTATION DATE:  06/15/2020 REFERRING MD: Bonner Puna , CHIEF COMPLAINT:   Acute on chronic respiratory failure with hypoxia in setting of Covid Pneumonitis  Brief History   Kristina Ramos is a 79 y.o. female with a history of follicular lymphoma (had her 6th cycle of maintenance Gazyva on 04/03/20)  , HTN, hypothyroidism, iron deficiency, and hospitalization 6/7-6/11 for covid-19 pneumonia. She was  discharged on decadron requiring 2L O2 having completed remdesivir. She completed decadron 6/16 and returned to ED 6/19 due to worsening dyspnea, lethargy. She was  found to be febrile to 101.62F with 4L O2 requirement. Pt. Has had increasing oxygen requirements over the last 24 hours, with escalation to [place] at  30 L/ Min. Pt. Required transfer to SDU 6/25 for monitoring of respiratory status . PCCM have been asked to consult for assistance and support in optimizing patient's respiratory status.  History of present illness   Kristina Ramos is a 79 y.o. female with a history of follicular lymphoma, HTN, hypothyroidism, iron deficiency, and hospitalization 6/7-6/11 for covid-19 pneumonia discharged on decadron requiring 2L O2 having completed remdesivir. She completed decadron 6/16 and returned to ED 6/19 due to worsening dyspnea, lethargy, found to be febrile to 101.62F with 4L O2 requirement. Per her daughter, she started having increased dyspnea the day after Decadron was discontinued. CXR demonstrated stable bilateral infiltrates with no new focal consolidation. She was lethargic, CT head unrevealing, and hyponatremic (Na 126). She was admitted, started back on steroids with improved mentation, strength, and given lasix empirically due to persistent desaturations worse with exertion.  Pt. has had increasing oxygen requirements over the last 24 hours, with escalation to [place] at  30 L/ Min. Pt. Required transfer to SDU  6/25 for monitoring of respiratory status . PCCM have been asked to consult for assistance and support in optimizing patient's respiratory status.   Past Medical History    Past Medical History:  Diagnosis Date  . Anemia   . Arthritis   . Depression   . Follicular lymphoma grade ii, lymph nodes of multiple sites ([place]) 09/30/2018  . Follicular lymphoma grade ii, lymph nodes of multiple sites ([place]) 09/30/2018  . GERD (gastroesophageal reflux disease)   . Goals of care, counseling/discussion 09/30/2018  . Heart murmur   . Hx of adenomatous colonic polyps 07/28/2017  . Hyperlipidemia   . Hypertension   . 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 Thyroid disease     Significant Hospital Events   01/26/2020>> Pfizer SARS COV-2 vaccine #1 02/20/2020 >>Pfizer SARS COV-2 vaccine #2 04/03/2020>> 6th cycle of maintenance Gazyva  xxxxxxxxxxx 6/7-6/11 Admission for Covid Pneumonia 6/7-6/11 Remdesivir treatment 6/11 Discharged home on decadron 06/06/2020 >> Re-admission for worsening dyspnea, fever, and increasing oxygen requirements 6/7-6/16 Decadron treatment ,  was restarted 6/19 06/15/2020>> Transfer to SDU for increasing oxygen demands, CXR with more pronounced airspace opacities / worsening atelectasis  6/25 - Currently on 30 L HHFNC ( 60%) Sats are 96%, RR is 18 Pt. Was speaking in full sentences  on phone and in no distress when I examined her. She desaturates with minimal exertion, and takes about 10 minutes to rebound to sats > 92% She " feels" when she is hypoxic.  WBC is 4.7 Procalcitonin is < 0.10 Na is 134 ( Up from 126 on admission )  CRP has up trended within the last 24 hours from 3.6 to  6.9 D-dimer down trended to  2.25 ( was 2.55 on  6/24) Oxygen demands have increased to 30 L HHFNC ( 60%) last 24 hours CXR 6/25 shows increase in airspace opacities , more pronounced and worsening atelectasis Net + 1255 since admission Good response to Lasix 40 today, with  2600 cc's out this am  Consults:  06/15/2020 >> CCM consulted   Procedures:    Significant Diagnostic Tests:  CXR 6/25 Diffuse heterogeneous airspace opacities slightly more pronounced than on the most recent comparison examination though some of this may be due to worsening atelectasis.  Micro Data:  05/28/2020>>  SARS Coronavirus 2 NEGATIVE POSITIVEAbnormal   05/27/2020 Urine Culture>> No Growth 6/19 Blood Culture>> No Growth 5 days  Antimicrobials:  6/19>> Vanc 2 grams x 1 dose 6/19-6/20>> Cefepime   Interim history/subjective:    06/16/2020 - still on HHFNC and face mask. Afebrile. Last echo 2019. On dvt proph. Sitting and able to got to toilet but that leaves her tired. Othewrise WOB ok  Results for NITZA, SCHMID (MRN [id number]) as of 06/16/2020 12:34  Ref. Range 06/10/2020 05:04 06/12/2020 04:42 06/14/2020 04:54 06/15/2020 01:56 06/16/2020 02:49  D-Dimer, Quant Latest Ref Range: 0.00 - 0.50 ug/mL-FEU 2.41 (H) 2.21 (H) 2.55 (H) 2.25 (H) 2.57 (H)  Results for JO-ANNE, KLUTH (MRN [id number]) as of 06/16/2020 12:34  Ref. Range 06/12/2020 04:42 06/13/2020 05:19 06/13/2020 10:48 06/14/2020 04:54 06/14/2020 12:54 06/14/2020 23:21 06/15/2020 01:56 06/15/2020 05:15 06/15/2020 17:45 06/16/2020 02:49 06/16/2020 04:40  CRP Latest Ref Range: <1.0 mg/dL 6.5 (H)   3.6 (H)   6.9 (H)   5.1 (H)   Procalcitonin Latest Units: ng/mL       <0.10        Objective   Blood pressure (!) 185/94, pulse (!) 109, temperature 98.1 F (36.7 C), temperature source Oral, resp. rate (!) 40, height 5' 4"  (1.626 m), weight 99.8 kg, SpO2 (!) 87 %.    FiO2 (%):  [50 %-70 %] 60 %   Intake/Output Summary (Last 24 hours) at 06/16/2020 1234 Last data filed at 06/16/2020 1000 Gross per 24 hour  Intake 393.09 ml  Output --  Net 393.09 ml   Filed Weights   05/23/2020 1807  Weight: 99.8 kg   General Appearance:  Looks cunwell but no distress. On FM andHHFNC Head:  Normocephalic, without obvious abnormality,  atraumatic Eyes:  PERRL - yes, conjunctiva/corneas - muddy     Ears:  Normal external ear canals, both ears Nose:  G tube - no bu thas hhfnc Throat:  ETT TUBE - no , OG tube - no Neck:  Supple,  No enlargement/tenderness/nodules Lungs: tachypneic but not paradoxical. Distant crackles maybe Heart:  S1 and S2 normal, no murmur, CVP - no.  Pressors - no Abdomen:  Soft, no masses, no organomegaly Genitalia / Rectal:  Not done Extremities:  Extremities- intact Skin:  ntact in exposed areas . Sacral area - looks intact Neurologic:  Sedation - none -> RASS - _1 . Moves all 4s - yes. CAM-ICU - neg . Orientation - x3+      Resolved Hospital Problem list     Assessment & Plan:  Acute on Chronic Respiratory Failure 2/2 Covid 19 Pneumonia Has had lingering  hypoxemia since re-admission 06/06/2020 6/7-6/16>> Received  Decadron treatment ,was restarted 6/19 6/25 Worsening airspace opacities on CXR, Increased atelectasis Desaturates with minimal exertion, slow to rebound D dimer with slight down Trend 6/25 PCT is < 0.10, ABX not indicated  06/16/2020  -  overall no change. On HHFNC and Face mask o2. This bounce back ALI post initial covid   Plan Titrate oxygen for sats > 92% -> BiPAP if needed -> intubate if worse CXR in am and prn Solumedrol 80m IV daily -> check ESR -> if high increase solumedrol Lasix per Primary Team  Check echo and bnp and trop OOB to chair Aggressive Pulmonary Toilet as patient can tolerate.  PT/OT once stablized Consider CTA to RO PE should patient continue to decline, or if there is an increase in d-dimer, CRP Continue isolation through 6/28 ( 21 days after diagnosis) Close follow up in Pulmonary Office as an outpatient to follow until CXR clears  Relative Adrenal Insufficiency Global symptomatic Improvement with re-initiation of Steroids Plan Continue steroids , with slow prolonged taper  Follicular Lymphoma Due for next round of Gazyva 06/2020 Plan May  need to consider postponing next dose of chemo until dyspnea has improved and CXR has cleared  Mild llow mag  Plan  - replete  Rest per Primary Team    Best practice:  Diet: Per Primary Team Pain/Anxiety/Delirium protocol (if indicated): NA VAP protocol (if indicated): NA DVT prophylaxis: Lovenox GI prophylaxis: Per Primary Team Glucose control: CBG Mobility: OOB to chair Code Status: Full Family Communication:  Per primary team  Disposition: Step Down     ATTESTATION & SIGNATURE   The patient DSophiea Ramos is critically ill with multiple organ systems failure and requires high complexity decision making for assessment and support, frequent evaluation and titration of therapies, application of advanced monitoring technologies and extensive interpretation of multiple databases.   Critical Care Time devoted to patient care services described in this note is  35  Minutes. This time reflects time of care of this signee Dr MBrand Males This critical care time does not reflect procedure time, or teaching time or supervisory time of PA/NP/Med student/Med Resident etc but could involve care discussion time     Dr. MBrand Males M.D., FNatchitoches Regional Medical CenterC.P Pulmonary and Critical Care Medicine Staff Physician CTower CityPulmonary and Critical Care Pager: [phone] If no answer or between  15:00h - 7:00h: call [phone]  06/16/2020 12:34 PM     LABS    PULMONARY Recent Labs  Lab 05/23/2020 1835  HCO3 27.1  O2SAT 93.3    CBC Recent Labs  Lab 06/14/20 0454 06/15/20 0156 06/16/20 0249  HGB 9.4* 9.5* 11.2*  HCT 27.3* 28.3* 32.5*  WBC 5.9 4.7 5.8  PLT 126* 138* 144*    COAGULATION Recent Labs  Lab 06/02/2020 1834  INR 1.1    CARDIAC  No results for input(s): TROPONINI in the last 168 hours. No results for input(s): PROBNP in the last 168 hours.   CHEMISTRY Recent Labs  Lab 06/11/20 0610 06/11/20 0610 06/12/20 0442 06/12/20 0442  06/13/20 0519 06/13/20 0519 06/14/20 0454 06/16/20 0249  NA 135  --  132*  --  136  --  134* 129*  K 3.9   < > 4.5   < > 5.1   < > 4.7 4.1  CL 102  --  100  --  99  --  101 89*  CO2 25  --  25  --  26  --  24 27  GLUCOSE 128*  --  127*  --  111*  --  108* 129*  BUN 13  --  10  --  12  --  13 19  CREATININE 0.70  --  0.62  --  0.64  --  0.57 0.67  CALCIUM 8.2*  --  8.1*  --  8.6*  --  8.2* 8.1*  MG 1.7  --   --   --   --   --  1.7 1.7  PHOS 2.7  --   --   --   --   --   --   --    < > = values in this interval not displayed.   Estimated Creatinine Clearance: 66.5 mL/min (by C-G formula based on SCr of 0.67 mg/dL).   LIVER Recent Labs  Lab 05/28/2020 1834 06/11/20 0610 06/12/20 0442 06/14/20 0454  AST 41 22 16 17   ALT 27 22 17 16   ALKPHOS 45 40 41 39  BILITOT 0.9 0.4 0.5 1.1  PROT 5.6* 5.2* 5.1* 5.2*  ALBUMIN 2.6* 2.5* 2.4* 2.5*  INR 1.1  --   --   --      INFECTIOUS Recent Labs  Lab 06/02/2020 1833 06/10/20 0504 06/15/20 0156  LATICACIDVEN 0.8  --   --   PROCALCITON  --  <0.10 <0.10     ENDOCRINE CBG (last 3)  No results for input(s): GLUCAP in the last 72 hours.       IMAGING x48h  - image(s) personally visualized  -   highlighted in bold DG CHEST PORT 1 VIEW  Result Date: 06/16/2020 CLINICAL DATA:  Respiratory failure.  Follow-up exam. EXAM: PORTABLE CHEST 1 VIEW COMPARISON:  06/15/2020 and earlier studies. FINDINGS: Bilateral airspace lung opacities are unchanged from the most recent prior exam. No new lung abnormalities. No convincing pleural effusion and no pneumothorax. Cardiac silhouette is normal in size. IMPRESSION: 1. No significant change from the previous day's study. 2. Persistent bilateral airspace lung opacities consistent with multifocal pneumonia. Electronically Signed   By: Lajean Manes M.D.   On: 06/16/2020 08:35   DG CHEST PORT 1 VIEW  Result Date: 06/15/2020 CLINICAL DATA:  COVID-19 EXAM: PORTABLE CHEST 1 VIEW COMPARISON:  Radiograph  06/11/2020 FINDINGS: Diffuse heterogeneous airspace opacities present slightly more pronounced than on the most recent comparison examination though some of this may be due to diminished lung volumes and worsening atelectatic change. No pneumothorax or visible effusions. Cardiomediastinal contours are partially obscured though essentially unchanged from the comparison study accounting for differences in inflation. No acute osseous or soft tissue abnormality. Telemetry leads overlie the chest. IMPRESSION: Diffuse heterogeneous airspace opacities slightly more pronounced than on the most recent comparison examination though some of this may be due to worsening atelectasis. Electronically Signed   By: Lovena Le M.D.   On: 06/15/2020 05:35   VAS [place] UPPER EXTREMITY VENOUS DUPLEX  Result Date: 06/14/2020 UPPER VENOUS STUDY  Indications: IV team noticed left brachial vein would not compress, Covid-19 Limitations: Body habitus. Comparison Study: No prior study on file for comparison Performing Technologist: Sharion Dove RVS  Examination Guidelines: A complete evaluation includes B-mode imaging, spectral Doppler, color Doppler, and power Doppler as needed of all accessible portions of each vessel. Bilateral testing is considered an integral part of a complete examination. Limited examinations for reoccurring indications may be performed as noted.  Right Findings: +----------+------------+---------+-----------+----------+-------+ RIGHT     CompressiblePhasicitySpontaneousPropertiesSummary +----------+------------+---------+-----------+----------+-------+ Subclavian               Yes       Yes                      +----------+------------+---------+-----------+----------+-------+  Left Findings: +----------+------------+---------+-----------+----------+--------+  LEFT      CompressiblePhasicitySpontaneousPropertiesSummary  +----------+------------+---------+-----------+----------+--------+ IJV            Full       Yes       Yes                       +----------+------------+---------+-----------+----------+--------+ Subclavian    Full       Yes       Yes                       +----------+------------+---------+-----------+----------+--------+ Axillary      Full       Yes       Yes                       +----------+------------+---------+-----------+----------+--------+ Brachial      Full       Yes       Yes              tortuous +----------+------------+---------+-----------+----------+--------+ Cephalic      Full                                           +----------+------------+---------+-----------+----------+--------+ Basilic       Full                                           +----------+------------+---------+-----------+----------+--------+  Summary:  Right: No evidence of thrombosis in the subclavian.  Left: No evidence of deep vein thrombosis in the upper extremity. No evidence of superficial vein thrombosis in the upper extremity.  *See table(s) above for measurements and observations.  Diagnosing physician: Servando Snare MD Electronically signed by Servando Snare MD on 06/14/2020 at 9:03:18 PM.    Final

## 2020-06-16 NOTE — Progress Notes (Signed)
PROGRESS NOTE  Kristina Ramos  GQQ:[id number] DOB: 1941/11/13 DOA: 06/16/2020 PCP: Ann Held, DO   Brief Narrative: Kristina Ramos is a 79 y.o. female with a history of follicular lymphoma, HTN, hypothyroidism, iron deficiency, and hospitalization 6/7-6/11 for covid-19 pneumonia discharged on decadron requiring 2L O2 having completed remdesivir. She completed decadron 6/16 and returned to ED 6/19 due to worsening dyspnea, lethargy, found to be febrile to 101.56F with 4L O2 requirement. CXR demonstrated stable bilateral infiltrates with no new focal consolidation. She was lethargic, CT head unrevealing, and hyponatremic (Na 126). She was admitted, started back on steroids with improved mentation, strength, and given lasix empirically due to persistent desaturations worse with exertion. Hypoxemia worsened and she was transferred to SDU on [facility] 6/24 with subsequent PCCM consultation 6/25.   Assessment & Plan: Principal Problem:   Acute on chronic respiratory failure with hypoxia (HCC) Active Problems:   Hypothyroidism   Essential hypertension, benign   Follicular lymphoma grade ii, lymph nodes of multiple sites ([place])   Hyponatremia  Acute on chronic hypoxic respiratory due to ARDS due to covid-19 pneumonia: Of note, pt had been fully immunized prior to contracting covid. CXR with persistent bilateral infiltrates consistent with ARDS. - D/w ID, Dr. Baxter Flattery. Unfortunately, too late to send sample to state lab at this point. - Hypoxemia is severe, remains at risk of intubation. - s/p remesivir 6/7 - 6/11 - s/p decadron 6/7 - 6/16, restarted 6/19 >> - CRP remains elevated, will increase steroid dosing > solumedrol 60mg  IV BID.  - RVP negative.   - Continue isolation x21 days from positive test (6/7) - Continue supplemental oxygen, recommend proning if possible. Atelectasis likely also contributing, so IS and OOB encouraged.  - D-dimer remains stable without acute rise and exam remains  consistent with covid pneumonia. Will continue trending and plan CTA chest if no improvement and consistently elevated d-dimer despite improvement in CRP. - Continue gentle trial of diuresis to optimize oxygenation. Check echocardiogram.   Relative adrenal insufficiency: Significant global symptomatic improvement with reinitiation of steroids.  - Will continue steroids with anticipated prolonged taper.  Pancytopenia: Lymphopenia on admission with normocytic anemia and thrombocytopenia. Likely related to viral infection with hemodilutional process with IVF. Splenomegaly related to lymphoma likely contributing. - Monitoring. Stable.  Hyponatremia: Worsened with diuresis.  - Continue monitoring in AM. Will hold lasix for now given broadening BUN:Cr.  Hypokalemia: Supplemented.  - Monitoring  Follicular lymphoma: follows with Dr. 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  Obesity: Estimated body mass index is 37.76 kg/m as calculated from the following:   Height as of this encounter: 5\' 4"  (1.626 m).   Weight as of this encounter: 99.8 kg.  DVT prophylaxis: Lovenox Code Status: Full Family Communication: Daughter by phone daily Disposition Plan: Remain in SDU on heated high flow oxygen.  Status is: Inpatient  Remains inpatient appropriate because:Inpatient level of care appropriate due to severity of illness and severe hypoxemia.   Dispo: The patient is from: Home              Anticipated d/c is to: TBD              Anticipated d/c date is: > 3 days  Patient currently is not medically stable to d/c.  Consultants:   Pulmonology 6/25  Procedures:   None  Antimicrobials:  Vancomycin, cefepime x1   Subjective: Pt reports feeling better  today than yesterday, but having more of a cough that is not productive. With the cough as well as with any exertion at all, oxygen drops. For pulmonary exam, she listed forward in the chair and SpO2 down to 78%. Denies chest pain, no leg swelling. No wheezing.   Objective: Vitals:   06/16/20 0839 06/16/20 1000 06/16/20 1200 06/16/20 1237  BP: (!) 159/81 (!) 140/44 (!) 185/94   Pulse: 94 (!) 104 (!) 109   Resp: (!) 24 15 (!) 40   Temp:   98.1 F (36.7 C)   TempSrc:   Oral   SpO2: 91% (!) 88%  90%  Weight:      Height:        Intake/Output Summary (Last 24 hours) at 06/16/2020 1455 Last data filed at 06/16/2020 1000 Gross per 24 hour  Intake 393.09 ml  Output --  Net 393.09 ml   Filed Weights   05/26/2020 1807  Weight: 99.8 kg   Gen: 79 y.o. female in no acute distress Pulm: Nonlabored at rest, tachypneic with mild distress when exerting herself in any way. Crackles throughout, no wheezes. CV: Regular borderline tachycardia. No murmur, rub, or gallop. No JVD, no dependent edema. GI: Abdomen soft, non-tender, non-distended, with normoactive bowel sounds.  Ext: Warm, no deformities Skin: No new rashes, lesions or ulcers on visualized skin. Neuro: Alert and oriented. No focal neurological deficits. Psych: Judgement and insight appear fair. Mood euthymic & affect congruent. Behavior is appropriate.    Data Reviewed: I have personally reviewed following labs and imaging studies  CBC: Recent Labs  Lab 06/18/2020 1834 06/11/20 0610 06/12/20 0442 06/13/20 1048 06/14/20 0454 06/15/20 0156 06/16/20 0249  WBC 7.8   < > 3.2* 7.9 5.9 4.7 5.8  NEUTROABS 6.7  --   --   --  5.4 4.2 5.0  HGB 10.6*   < > 9.1* 10.9* 9.4* 9.5* 11.2*  HCT 29.4*   < > 27.1* 32.2* 27.3* 28.3* 32.5*  MCV 83.5   < > 87.1 86.3 85.0 86.0 83.8  PLT 125*   < > 109* 148* 126* 138* 144*   < > = values in this interval not displayed.   Basic Metabolic Panel: Recent Labs  Lab 06/11/20 0610 06/12/20 0442  06/13/20 0519 06/14/20 0454 06/16/20 0249  NA 135 132* 136 134* 129*  K 3.9 4.5 5.1 4.7 4.1  CL 102 100 99 101 89*  CO2 25 25 26 24 27   GLUCOSE 128* 127* 111* 108* 129*  BUN 13 10 12 13 19   CREATININE 0.70 0.62 0.64 0.57 0.67  CALCIUM 8.2* 8.1* 8.6* 8.2* 8.1*  MG 1.7  --   --  1.7 1.7  PHOS 2.7  --   --   --   --    GFR: Estimated Creatinine Clearance: 66.5 mL/min (by C-G formula based on SCr of 0.67 mg/dL). Liver Function Tests: Recent Labs  Lab 06/18/2020 1834 06/11/20 0610 06/12/20 0442 06/14/20 0454  AST 41 22 16 17   ALT 27 22 17 16   ALKPHOS 45 40 41 39  BILITOT 0.9 0.4 0.5 1.1  PROT 5.6* 5.2* 5.1* 5.2*  ALBUMIN 2.6* 2.5* 2.4* 2.5*   No results for input(s): LIPASE, AMYLASE in the last 168 hours. No results for input(s): AMMONIA in the last 168 hours. Coagulation  Profile: Recent Labs  Lab 06/07/2020 1834  INR 1.1   Cardiac Enzymes: No results for input(s): CKTOTAL, CKMB, CKMBINDEX, TROPONINI in the last 168 hours. BNP (last 3 results) No results for input(s): PROBNP in the last 8760 hours. HbA1C: No results for input(s): HGBA1C in the last 72 hours. CBG: No results for input(s): GLUCAP in the last 168 hours. Lipid Profile: No results for input(s): CHOL, HDL, LDLCALC, TRIG, CHOLHDL, LDLDIRECT in the last 72 hours. Thyroid Function Tests: No results for input(s): TSH, T4TOTAL, FREET4, T3FREE, THYROIDAB in the last 72 hours. Anemia Panel: No results for input(s): VITAMINB12, FOLATE, FERRITIN, TIBC, IRON, RETICCTPCT in the last 72 hours. Urine analysis:    Component Value Date/Time   COLORURINE YELLOW 05/22/2020 1812   APPEARANCEUR CLEAR 06/10/2020 1812   LABSPEC 1.015 06/19/2020 1812   PHURINE 6.0 06/08/2020 1812   GLUCOSEU NEGATIVE 06/16/2020 1812   HGBUR NEGATIVE 05/28/2020 1812   BILIRUBINUR NEGATIVE 06/19/2020 1812   KETONESUR NEGATIVE 05/25/2020 1812   PROTEINUR NEGATIVE 05/25/2020 1812   NITRITE NEGATIVE 05/27/2020 1812   LEUKOCYTESUR NEGATIVE  06/08/2020 1812   Recent Results (from the past 240 hour(s))  Urine culture     Status: None   Collection Time: 05/22/2020  6:12 PM   Specimen: In/Out Cath Urine  Result Value Ref Range Status   Specimen Description   Final    IN/OUT CATH URINE Performed at [facility], [place] [address], [place], [place] [address]    Special Requests   Final    NONE Performed at [facility], [place] [address], [place], [place] [address]    Culture   Final    NO GROWTH Performed at [facility], [place] [address], [place], [place] [address]    Report Status 06/11/2020 FINAL  Final  Blood Culture (routine x 2)     Status: None   Collection Time: 05/30/2020  6:34 PM   Specimen: BLOOD  Result Value Ref Range Status   Specimen Description   Final    BLOOD LEFT ANTECUBITAL Performed at [place] [address], [place], [place] [address]    Special Requests   Final    BOTTLES DRAWN AEROBIC AND ANAEROBIC Blood Culture adequate volume Performed at [place] [address], [place], [place] [address]    Culture   Final    NO GROWTH 5 DAYS Performed at [facility], [place] [address], [place], [place] [address]    Report Status 06/14/2020 FINAL  Final  Blood Culture (routine x 2)     Status: None   Collection Time: 06/08/2020  6:36 PM   Specimen: BLOOD LEFT HAND  Result Value Ref Range Status   Specimen Description   Final    BLOOD LEFT HAND Performed at [place] [address], [place], [place] [address]    Special Requests   Final    BOTTLES DRAWN AEROBIC AND ANAEROBIC Blood Culture adequate volume Performed at [place] [address], [place], [place] [address]    Culture   Final    NO GROWTH 5 DAYS Performed at [facility], [place] [address], [place], [place] [address]    Report Status 06/14/2020 FINAL  Final  MRSA PCR Screening     Status: None    Collection Time: 06/14/20 11:21 PM   Specimen: Nasopharyngeal  Result Value Ref Range Status   MRSA by PCR NEGATIVE NEGATIVE Final  Comment:        The GeneXpert MRSA Assay (FDA approved for NASAL specimens only), is one component of a comprehensive MRSA colonization surveillance program. It is not intended to diagnose MRSA infection nor to guide or monitor treatment for MRSA infections. Performed at [facility], [place] [address], [place], [place] [address]   Respiratory Panel by PCR     Status: None   Collection Time: 06/15/20  5:45 PM   Specimen: Nasopharyngeal Swab; Respiratory  Result Value Ref Range Status   Adenovirus NOT DETECTED NOT DETECTED Final   Coronavirus 229E NOT DETECTED NOT DETECTED Final    Comment: (NOTE) The Coronavirus on the Respiratory Panel, DOES NOT test for the novel  Coronavirus (2019 nCoV)    Coronavirus HKU1 NOT DETECTED NOT DETECTED Final   Coronavirus NL63 NOT DETECTED NOT DETECTED Final   Coronavirus OC43 NOT DETECTED NOT DETECTED Final   Metapneumovirus NOT DETECTED NOT DETECTED Final   Rhinovirus / Enterovirus NOT DETECTED NOT DETECTED Final   Influenza A NOT DETECTED NOT DETECTED Final   Influenza B NOT DETECTED NOT DETECTED Final   Parainfluenza Virus 1 NOT DETECTED NOT DETECTED Final   Parainfluenza Virus 2 NOT DETECTED NOT DETECTED Final   Parainfluenza Virus 3 NOT DETECTED NOT DETECTED Final   Parainfluenza Virus 4 NOT DETECTED NOT DETECTED Final   Respiratory Syncytial Virus NOT DETECTED NOT DETECTED Final   Bordetella pertussis NOT DETECTED NOT DETECTED Final   Chlamydophila pneumoniae NOT DETECTED NOT DETECTED Final   Mycoplasma pneumoniae NOT DETECTED NOT DETECTED Final    Comment: Performed at [facility], [place]. [address] [place], [place] [address]      Radiology Studies: DG CHEST PORT 1 VIEW  Result Date: 06/16/2020 CLINICAL DATA:  Respiratory failure.  Follow-up exam. EXAM: PORTABLE CHEST 1 VIEW  COMPARISON:  06/15/2020 and earlier studies. FINDINGS: Bilateral airspace lung opacities are unchanged from the most recent prior exam. No new lung abnormalities. No convincing pleural effusion and no pneumothorax. Cardiac silhouette is normal in size. IMPRESSION: 1. No significant change from the previous day's study. 2. Persistent bilateral airspace lung opacities consistent with multifocal pneumonia. Electronically Signed   By: Lajean Manes M.D.   On: 06/16/2020 08:35   DG CHEST PORT 1 VIEW  Result Date: 06/15/2020 CLINICAL DATA:  COVID-19 EXAM: PORTABLE CHEST 1 VIEW COMPARISON:  Radiograph 06/11/2020 FINDINGS: Diffuse heterogeneous airspace opacities present slightly more pronounced than on the most recent comparison examination though some of this may be due to diminished lung volumes and worsening atelectatic change. No pneumothorax or visible effusions. Cardiomediastinal contours are partially obscured though essentially unchanged from the comparison study accounting for differences in inflation. No acute osseous or soft tissue abnormality. Telemetry leads overlie the chest. IMPRESSION: Diffuse heterogeneous airspace opacities slightly more pronounced than on the most recent comparison examination though some of this may be due to worsening atelectasis. Electronically Signed   By: Lovena Le M.D.   On: 06/15/2020 05:35    Scheduled Meds: . benzonatate  100 mg Oral BID  . Chlorhexidine Gluconate Cloth  6 each Topical Daily  . cholecalciferol  2,000 Units Oral Daily  . enoxaparin (LOVENOX) injection  40 mg Subcutaneous QHS  . furosemide  40 mg Intravenous Daily  . levothyroxine  75 mcg Oral Q0600  . mouth rinse  15 mL Mouth Rinse BID  . methylPREDNISolone (SOLU-MEDROL) injection  60 mg Intravenous Q12H   Continuous Infusions: . magnesium sulfate bolus IVPB  LOS: 7 days   Time spent: 35 minutes.  Patrecia Pour, MD [facility] [email or web address] 06/16/2020, 2:55 PM

## 2020-06-16 NOTE — Progress Notes (Signed)
Patient notified nurse of sudden tight pain in her chest that radiated into her back. EKG was completed, vital signs- which showed an elevation in BP (see vital signs flowsheets), and patient was repositioned in bed. Upon assessment her right lung remains from the front clear but diminished in the upper lobe, and diminished in the lower lobes. When listening from the back I can hear clear lung sounds in the upper two lobes, with the lower lobe diminished. In her left lung I can hear clear lung sounds in the upper lobe, but clear to diminished in the lower lobe. From the back I can hear clear lung sounds in both lobes, with the bottom slightly more diminished. To be noted I assessed her less than an hour prior to this episode and lung sounds were the same with the exception of some expiratory wheezing in the right upper lobe. When I cared for this patient last night she was found to be diminished in all lobes. Her pain subsided before my exam was complete. The patient says she has never had a pain similar to this before. I have already treated her with tussionex. Will notify on call provider of findings and will continue to assess for change in status or condition.

## 2020-06-16 NOTE — Progress Notes (Signed)
  Echocardiogram 2D Echocardiogram has been attempted. Patient in chair. Will reattempt at later date.  Randa Lynn Norvella Loscalzo 06/16/2020, 3:05 PM

## 2020-06-17 ENCOUNTER — Inpatient Hospital Stay (HOSPITAL_COMMUNITY): Payer: Medicare Other

## 2020-06-17 DIAGNOSIS — J9621 Acute and chronic respiratory failure with hypoxia: Secondary | ICD-10-CM

## 2020-06-17 DIAGNOSIS — U071 COVID-19: Secondary | ICD-10-CM

## 2020-06-17 LAB — CBC WITH DIFFERENTIAL/PLATELET
Abs Immature Granulocytes: 0.17 10*3/uL — ABNORMAL HIGH (ref 0.00–0.07)
Basophils Absolute: 0 10*3/uL (ref 0.0–0.1)
Basophils Relative: 0 %
Eosinophils Absolute: 0 10*3/uL (ref 0.0–0.5)
Eosinophils Relative: 0 %
HCT: 30.7 % — ABNORMAL LOW (ref 36.0–46.0)
Hemoglobin: 10.4 g/dL — ABNORMAL LOW (ref 12.0–15.0)
Immature Granulocytes: 4 %
Lymphocytes Relative: 4 %
Lymphs Abs: 0.2 10*3/uL — ABNORMAL LOW (ref 0.7–4.0)
MCH: 29 pg (ref 26.0–34.0)
MCHC: 33.9 g/dL (ref 30.0–36.0)
MCV: 85.5 fL (ref 80.0–100.0)
Monocytes Absolute: 0.1 10*3/uL (ref 0.1–1.0)
Monocytes Relative: 2 %
Neutro Abs: 4.2 10*3/uL (ref 1.7–7.7)
Neutrophils Relative %: 90 %
Platelets: 177 10*3/uL (ref 150–400)
RBC: 3.59 MIL/uL — ABNORMAL LOW (ref 3.87–5.11)
RDW: 13.5 % (ref 11.5–15.5)
WBC: 4.6 10*3/uL (ref 4.0–10.5)
nRBC: 0 % (ref 0.0–0.2)

## 2020-06-17 LAB — SAR COV2 SEROLOGY (COVID19)AB(IGG),IA: SARS-CoV-2 Ab, IgG: NONREACTIVE

## 2020-06-17 LAB — BASIC METABOLIC PANEL
Anion gap: 11 (ref 5–15)
BUN: 21 mg/dL (ref 8–23)
CO2: 31 mmol/L (ref 22–32)
Calcium: 8.4 mg/dL — ABNORMAL LOW (ref 8.9–10.3)
Chloride: 89 mmol/L — ABNORMAL LOW (ref 98–111)
Creatinine, Ser: 0.58 mg/dL (ref 0.44–1.00)
GFR calc Af Amer: >60 mL/min (ref >60)
GFR calc non Af Amer: >60 mL/min (ref >60)
Glucose, Bld: 167 mg/dL — ABNORMAL HIGH (ref 70–99)
Potassium: 3.8 mmol/L (ref 3.5–5.1)
Sodium: 131 mmol/L — ABNORMAL LOW (ref 135–145)

## 2020-06-17 LAB — GLUCOSE, CAPILLARY
Glucose-Capillary: 198 mg/dL — ABNORMAL HIGH (ref 70–99)
Glucose-Capillary: 242 mg/dL — ABNORMAL HIGH (ref 70–99)

## 2020-06-17 LAB — TROPONIN I (HIGH SENSITIVITY): Troponin I (High Sensitivity): 5 ng/L (ref <18)

## 2020-06-17 LAB — ECHOCARDIOGRAM COMPLETE
Height: 64 in
Weight: 3520 oz

## 2020-06-17 LAB — BRAIN NATRIURETIC PEPTIDE: B Natriuretic Peptide: 188.3 pg/mL — ABNORMAL HIGH (ref 0.0–100.0)

## 2020-06-17 LAB — SEDIMENTATION RATE: Sed Rate: 85 mm/hr — ABNORMAL HIGH (ref 0–22)

## 2020-06-17 LAB — MAGNESIUM: Magnesium: 2.1 mg/dL (ref 1.7–2.4)

## 2020-06-17 LAB — D-DIMER, QUANTITATIVE: D-Dimer, Quant: 2.32 ug/mL-FEU — ABNORMAL HIGH (ref 0.00–0.50)

## 2020-06-17 LAB — PHOSPHORUS: Phosphorus: 3.5 mg/dL (ref 2.5–4.6)

## 2020-06-17 LAB — C-REACTIVE PROTEIN: CRP: 5.7 mg/dL — ABNORMAL HIGH (ref <1.0)

## 2020-06-17 MED ORDER — FUROSEMIDE 10 MG/ML IJ SOLN
40.0000 mg | Freq: Once | INTRAMUSCULAR | Status: AC
  Administered 2020-06-17: 40 mg via INTRAVENOUS
  Filled 2020-06-17: qty 4

## 2020-06-17 MED ORDER — FUROSEMIDE 10 MG/ML IJ SOLN
40.0000 mg | Freq: Two times a day (BID) | INTRAMUSCULAR | Status: DC
  Administered 2020-06-17 – 2020-06-18 (×3): 40 mg via INTRAVENOUS
  Filled 2020-06-17 (×3): qty 4

## 2020-06-17 MED ORDER — POTASSIUM CHLORIDE CRYS ER 20 MEQ PO TBCR
40.0000 meq | EXTENDED_RELEASE_TABLET | Freq: Once | ORAL | Status: AC
  Administered 2020-06-17: 40 meq via ORAL
  Filled 2020-06-17: qty 2

## 2020-06-17 MED ORDER — INSULIN ASPART 100 UNIT/ML ~~LOC~~ SOLN
3.0000 [IU] | SUBCUTANEOUS | Status: DC
  Administered 2020-06-17: 9 [IU] via SUBCUTANEOUS
  Administered 2020-06-17: 6 [IU] via SUBCUTANEOUS
  Administered 2020-06-18 (×2): 3 [IU] via SUBCUTANEOUS
  Administered 2020-06-18: 9 [IU] via SUBCUTANEOUS
  Administered 2020-06-18 – 2020-06-19 (×3): 3 [IU] via SUBCUTANEOUS
  Administered 2020-06-19: 6 [IU] via SUBCUTANEOUS
  Administered 2020-06-19: 3 [IU] via SUBCUTANEOUS
  Administered 2020-06-19: 6 [IU] via SUBCUTANEOUS
  Administered 2020-06-19: 3 [IU] via SUBCUTANEOUS
  Administered 2020-06-19 – 2020-06-20 (×2): 9 [IU] via SUBCUTANEOUS
  Administered 2020-06-20: 6 [IU] via SUBCUTANEOUS
  Administered 2020-06-20 (×2): 3 [IU] via SUBCUTANEOUS
  Administered 2020-06-21: 9 [IU] via SUBCUTANEOUS
  Administered 2020-06-21 (×4): 3 [IU] via SUBCUTANEOUS
  Administered 2020-06-22 (×5): 6 [IU] via SUBCUTANEOUS
  Administered 2020-06-23 (×2): 3 [IU] via SUBCUTANEOUS
  Administered 2020-06-23: 6 [IU] via SUBCUTANEOUS
  Administered 2020-06-23: 3 [IU] via SUBCUTANEOUS
  Administered 2020-06-23: 9 [IU] via SUBCUTANEOUS
  Administered 2020-06-24 (×6): 3 [IU] via SUBCUTANEOUS
  Administered 2020-06-24: 6 [IU] via SUBCUTANEOUS

## 2020-06-17 MED ORDER — ALPRAZOLAM 0.25 MG PO TABS: 0.2500 mg | ORAL_TABLET | Freq: Once | ORAL | Status: DC

## 2020-06-17 MED ORDER — METHYLPREDNISOLONE SODIUM SUCC 125 MG IJ SOLR
125.0000 mg | Freq: Four times a day (QID) | INTRAMUSCULAR | Status: DC
  Administered 2020-06-17 – 2020-06-20 (×12): 125 mg via INTRAVENOUS
  Filled 2020-06-17 (×12): qty 2

## 2020-06-17 NOTE — Progress Notes (Signed)
Bilateral lower extremity venous duplex has been completed. Preliminary results can be found in CV Proc through chart review.   06/17/20 9:28 AM Carlos Levering RVT

## 2020-06-17 NOTE — Consult Note (Signed)
NAME:  Kristina Ramos, MRN:  [id number], DOB:  1941-02-21, LOS: 8 ADMISSION DATE:  05/30/2020, CONSULTATION DATE:  06/15/2020 REFERRING MD: Bonner Puna , CHIEF COMPLAINT:   Acute on chronic respiratory failure with hypoxia in setting of Covid Pneumonitis  Brief History    Kristina Ramos is a 79 y.o. female with a history of follicular lymphoma, HTN, hypothyroidism, iron deficiency, and hospitalization 6/7-6/11 for covid-19 pneumonia discharged on decadron requiring 2L O2 having completed remdesivir. She completed decadron 6/16 and returned to ED 6/19 due to worsening dyspnea, lethargy, found to be febrile to 101.16F with 4L O2 requirement. Per her daughter, she started having increased dyspnea the day after Decadron was discontinued. CXR demonstrated stable bilateral infiltrates with no new focal consolidation. She was lethargic, CT head unrevealing, and hyponatremic (Na 126). She was admitted, started back on steroids with improved mentation, strength, and given lasix empirically due to persistent desaturations worse with exertion.  Pt. has had increasing oxygen requirements over the last 24 hours, with escalation to [place] at  30 L/ Min. Pt. Required transfer to SDU 6/25 for monitoring of respiratory status . PCCM have been asked to consult for assistance and support in optimizing patient's respiratory status.   Past Medical History    Past Medical History:  Diagnosis Date  . Anemia   . Arthritis   . Depression   . Follicular lymphoma grade ii, lymph nodes of multiple sites ([place]) 09/30/2018  . Follicular lymphoma grade ii, lymph nodes of multiple sites ([place]) 09/30/2018  . GERD (gastroesophageal reflux disease)   . Goals of care, counseling/discussion 09/30/2018  . Heart murmur   . Hx of adenomatous colonic polyps 07/28/2017  . Hyperlipidemia   . Hypertension   . 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 Thyroid disease     Significant Hospital Events   01/26/2020>> Pfizer SARS  COV-2 vaccine #1 02/20/2020 >>Pfizer SARS COV-2 vaccine #2 04/03/2020>> 6th cycle of maintenance Gazyva  xxxxxxxxxxx 6/7-6/11 Admission for Covid Pneumonia 6/7-6/11 Remdesivir treatment 6/11 Discharged home on decadron 06/06/2020 >> Re-admission for worsening dyspnea, fever, and increasing oxygen requirements 6/7-6/16 Decadron treatment ,  was restarted 6/19 06/15/2020>> Transfer to SDU for increasing oxygen demands, CXR with more pronounced airspace opacities / worsening atelectasis  6/25 - Currently on 30 L HHFNC ( 60%) Sats are 96%, RR is 18 Pt. Was speaking in full sentences  on phone and in no distress when I examined her. She desaturates with minimal exertion, and takes about 10 minutes to rebound to sats > 92% She " feels" when she is hypoxic.  WBC is 4.7 Procalcitonin is < 0.10 Na is 134 ( Up from 126 on admission )  CRP has up trended within the last 24 hours from 3.6 to 6.9 D-dimer down trended to  2.25 ( was 2.55 on  6/24) Oxygen demands have increased to 30 L HHFNC ( 60%) last 24 hours CXR 6/25 shows increase in airspace opacities , more pronounced and worsening atelectasis Net + 1255 since admission Good response to Lasix 40 today, with 2600 cc's out this am   6/26 - still on HHFNC and face mask. Afebrile. Last echo 2019. On dvt proph. Sitting and able to got to toilet but that leaves her tired. Othewrise WOB ok   Consults:  06/15/2020 >> CCM consulted   Procedures:    Significant Diagnostic Tests:  CXR 6/25 Diffuse heterogeneous airspace opacities slightly more pronounced than on the most recent comparison examination though some of this may  be due to worsening atelectasis.  Micro Data:  05/28/2020>>  SARS Coronavirus 2 NEGATIVE POSITIVEAbnormal   06/11/2020 Urine Culture>> No Growth 6/19 Blood Culture>> No Growth 5 days  [id number] 6/24- MRSA pcr - neg 6/25- RVP - neg 6/25- PCT < 0.1 6/25 - COVID IgG - negative 6/26- urine strep - neg 6/27- COvid Ig G -  negative     Antimicrobials:  6/19>> Vanc 2 grams x 1 dose 6/19-6/20>> Cefepime   Interim history/subjective:    06/17/2020 - ESR 84. Worsening hypoxemia, 90% fi02 , 35L HHFNC. She is oriented. Elected no cpr but refusing to discuss intubation. Says she does not want it versus she does not want to talk about it. Could not explain her fears. REports no dyspnea for talking on the phone but significant dyspnea trying to move out of chair. Pulse ox mostly in the 80s  ESR 84 ECHO pending. Trop normal    Objective   Blood pressure (!) 152/60, pulse (!) 102, temperature 97.7 F (36.5 C), temperature source Oral, resp. rate (!) 32, height '5\' 4"'$  (1.626 m), weight 99.8 kg, SpO2 (!) 89 %.    FiO2 (%):  [60 %-90 %] 90 %   Intake/Output Summary (Last 24 hours) at 06/17/2020 1235 Last data filed at 06/17/2020 1158 Gross per 24 hour  Intake 530 ml  Output 1025 ml  Net -495 ml   Filed Weights   05/24/2020 1807  Weight: 99.8 kg      General Appearance:  Looks unwell. Sitting in chair. Was talking on cellphone Head:  Normocephalic, without obvious abnormality, atraumatic Eyes:  PERRL - yes, conjunctiva/corneas - muddy     Ears:  Normal external ear canals, both ears Nose:  G tube - no but has HHFNC Throat:  ETT TUBE - no  Neck:  Supple,  No enlargement/tenderness/nodules Lungs: Clear to auscultation bilaterally - mostly., tachypneic, mildy ? paradoxicaly Heart:  S1 and S2 normal, no murmur, CVP - no.  Pressors - no Abdomen:  Soft, no masses, no organomegaly Genitalia / Rectal:  Not done Extremities:  Extremities- intact Skin:  ntact in exposed areas . Sacral area - not examine Neurologic:  Sedation - none -> RASS - +1 . Moves all 4s - yes. CAM-ICU - neg . Orientation - x3+          Resolved Hospital Problem list     Assessment & Plan:  Acute on Chronic Respiratory Failure 2/2 Covid 19 Pneumonia  06/17/2020  - worsening hypoxemia despite diuresis and low dose steroids. PCT  negative. RVP negative. This is bounceback covid. 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 Duplex LE negative. ECho pending   Plan BiPAP on/off in da and QHS Move to high dose steroids '125mg'$  solumedrol q6h with ssi Await echo CXR in am  Lasix per Primary Team  Await  echo and bnp and trop OOB to chair Aggressive Pulmonary Toilet as patient can tolerate.  Consider CTA to RO PE should patient continue to decline, or if there is an increase in d-dimer, CRP Continue isolation through 6/28 ( 21 days after diagnosis)  Next step is intubation - she seems unclear about her goals   Relative Adrenal Insufficiency Global symptomatic Improvement with re-initiation of Steroids Plan Continue steroids , with slow prolonged taper  Follicular Lymphoma Due for next round of Gazyva 06/2020 Plan May need to consider postponing next dose of chemo until dyspnea has improved and CXR has cleared    Best practice:  Diet: Per Primary Team Pain/Anxiety/Delirium protocol (  if indicated): NA VAP protocol (if indicated): NA DVT prophylaxis: Lovenox GI prophylaxis: Per Primary Team Glucose control: CBG Mobility: OOB to chair Code Status: Full Family Communication:  Per primary team  Disposition: Step Down     ATTESTATION & SIGNATURE   The patient Mishika Flippen Grassi is critically ill with multiple organ systems failure and requires high complexity decision making for assessment and support, frequent evaluation and titration of therapies, application of advanced monitoring technologies and extensive interpretation of multiple databases.   Critical Care Time devoted to patient care services described in this note is  35  Minutes. This time reflects time of care of this signee Dr Brand Males. This critical care time does not reflect procedure time, or teaching time or supervisory time of PA/NP/Med student/Med Resident etc but could involve care discussion time     Dr. Brand Males, M.D., [facility].C.P Pulmonary  and Critical Care Medicine Staff Physician [facility] Pager: [phone], If no answer or between  15:00h - 7:00h: call [phone]  06/17/2020 1:02 PM      LABS    PULMONARY No results for input(s): PHART, PCO2ART, PO2ART, HCO3, TCO2, O2SAT in the last 168 hours.  Invalid input(s): PCO2, PO2  CBC Recent Labs  Lab 06/15/20 0156 06/16/20 0249 06/17/20 0340  HGB 9.5* 11.2* 10.4*  HCT 28.3* 32.5* 30.7*  WBC 4.7 5.8 4.6  PLT 138* 144* 177    COAGULATION No results for input(s): INR in the last 168 hours.  CARDIAC  No results for input(s): TROPONINI in the last 168 hours. No results for input(s): PROBNP in the last 168 hours.   CHEMISTRY Recent Labs  Lab 06/11/20 0610 06/11/20 0610 06/12/20 0442 06/12/20 0442 06/13/20 0519 06/13/20 0519 06/14/20 0454 06/14/20 0454 06/16/20 0249 06/17/20 0340  NA 135   < > 132*  --  136  --  134*  --  129* 131*  K 3.9   < > 4.5   < > 5.1   < > 4.7   < > 4.1 3.8  CL 102   < > 100  --  99  --  101  --  89* 89*  CO2 25   < > 25  --  26  --  24  --  27 31  GLUCOSE 128*   < > 127*  --  111*  --  108*  --  129* 167*  BUN 13   < > 10  --  12  --  13  --  19 21  CREATININE 0.70   < > 0.62  --  0.64  --  0.57  --  0.67 0.58  CALCIUM 8.2*   < > 8.1*  --  8.6*  --  8.2*  --  8.1* 8.4*  MG 1.7  --   --   --   --   --  1.7  --  1.7 2.1  PHOS 2.7  --   --   --   --   --   --   --   --  3.5   < > = values in this interval not displayed.   Estimated Creatinine Clearance: 66.5 mL/min (by C-G formula based on SCr of 0.58 mg/dL).   LIVER Recent Labs  Lab 06/11/20 0610 06/12/20 0442 06/14/20 0454  AST '22 16 17  '$ ALT '22 17 16  '$ ALKPHOS 40 41 39  BILITOT 0.4 0.5 1.1  PROT 5.2* 5.1* 5.2*  ALBUMIN 2.5* 2.4* 2.5*     INFECTIOUS Recent Labs  Lab 06/15/20 0156  PROCALCITON <0.10     ENDOCRINE CBG (last 3)  No results for input(s): GLUCAP in the last 72  hours.       IMAGING x48h  - image(s) personally visualized  -   highlighted in bold DG CHEST PORT 1 VIEW  Result Date: 06/17/2020 CLINICAL DATA:  Chest pain EXAM: PORTABLE CHEST 1 VIEW COMPARISON:  06/16/2020 FINDINGS: Unchanged bilateral airspace opacities, left-greater-than-right. Cardiomediastinal contours are normal. No sizable pleural effusion. IMPRESSION: Unchanged bilateral airspace opacities, left-greater-than-right. Electronically Signed   By: Ulyses Jarred M.D.   On: 06/17/2020 00:35   DG CHEST PORT 1 VIEW  Result Date: 06/16/2020 CLINICAL DATA:  Respiratory failure.  Follow-up exam. EXAM: PORTABLE CHEST 1 VIEW COMPARISON:  06/15/2020 and earlier studies. FINDINGS: Bilateral airspace lung opacities are unchanged from the most recent prior exam. No new lung abnormalities. No convincing pleural effusion and no pneumothorax. Cardiac silhouette is normal in size. IMPRESSION: 1. No significant change from the previous day's study. 2. Persistent bilateral airspace lung opacities consistent with multifocal pneumonia. Electronically Signed   By: Lajean Manes M.D.   On: 06/16/2020 08:35   VAS [place] LOWER EXTREMITY VENOUS (DVT)  Result Date: 06/17/2020  Lower Venous DVTStudy Indications: Edema.  Risk Factors: COVID 19 positive. Limitations: Poor ultrasound/tissue interface and body habitus. Comparison Study: No prior studies. Performing Technologist: Oliver Hum RVT  Examination Guidelines: A complete evaluation includes B-mode imaging, spectral Doppler, color Doppler, and power Doppler as needed of all accessible portions of each vessel. Bilateral testing is considered an integral part of a complete examination. Limited examinations for reoccurring indications may be performed as noted. The reflux portion of the exam is performed with the patient in reverse Trendelenburg.  +---------+---------------+---------+-----------+----------+--------------+ RIGHT     CompressibilityPhasicitySpontaneityPropertiesThrombus Aging +---------+---------------+---------+-----------+----------+--------------+ CFV      Full           Yes      Yes                                 +---------+---------------+---------+-----------+----------+--------------+ SFJ      Full                                                        +---------+---------------+---------+-----------+----------+--------------+ FV Prox  Full                                                        +---------+---------------+---------+-----------+----------+--------------+ FV Mid   Full                                                        +---------+---------------+---------+-----------+----------+--------------+ FV DistalFull                                                        +---------+---------------+---------+-----------+----------+--------------+  PFV      Full                                                        +---------+---------------+---------+-----------+----------+--------------+ POP      Full           Yes      Yes                                 +---------+---------------+---------+-----------+----------+--------------+ PTV      Full                                                        +---------+---------------+---------+-----------+----------+--------------+ PERO     Full                                                        +---------+---------------+---------+-----------+----------+--------------+   +---------+---------------+---------+-----------+----------+--------------+ LEFT     CompressibilityPhasicitySpontaneityPropertiesThrombus Aging +---------+---------------+---------+-----------+----------+--------------+ CFV      Full           Yes      Yes                                 +---------+---------------+---------+-----------+----------+--------------+ SFJ      Full                                                         +---------+---------------+---------+-----------+----------+--------------+ FV Prox  Full                                                        +---------+---------------+---------+-----------+----------+--------------+ FV Mid   Full                                                        +---------+---------------+---------+-----------+----------+--------------+ FV DistalFull                                                        +---------+---------------+---------+-----------+----------+--------------+ PFV      Full                                                        +---------+---------------+---------+-----------+----------+--------------+  POP      Full           Yes      Yes                                 +---------+---------------+---------+-----------+----------+--------------+ PTV      Full                                                        +---------+---------------+---------+-----------+----------+--------------+ PERO     Full                                                        +---------+---------------+---------+-----------+----------+--------------+     Summary: RIGHT: - There is no evidence of deep vein thrombosis in the lower extremity.  - No cystic structure found in the popliteal fossa.  LEFT: - There is no evidence of deep vein thrombosis in the lower extremity.  - No cystic structure found in the popliteal fossa.  *See table(s) above for measurements and observations.    Preliminary

## 2020-06-17 NOTE — Progress Notes (Signed)
After a long conversation with patient and Rn, Marzetta Board, present patient agrees to try Bipap.  Attempted to place bipap mask on patient and she immediately pulled mask away from face stating that she is not able to tolerate.  Pt. Is c/o throat being dry, throat being sore and dry and not wanting to wear cannula any more.  Explained that we would try some things to make her more comfortable, however, she needed the [place] to maintain saturations above critical level.

## 2020-06-17 NOTE — Progress Notes (Signed)
  Echocardiogram 2D Echocardiogram has been attempted. Nurse request to reattempt and come back at later time.  Toshiko Kemler G Orlyn Odonoghue 06/17/2020, 11:12 AM

## 2020-06-17 NOTE — Progress Notes (Signed)
PROGRESS NOTE  Kristina Ramos  ZDG:[id number] DOB: Jun 05, 1941 DOA: 06/08/2020 PCP: Ann Held, DO   Brief Narrative: Kristina Ramos is a 79 y.o. female with a history of follicular lymphoma, HTN, hypothyroidism, iron deficiency, and hospitalization 6/7-6/11 for covid-19 pneumonia discharged on decadron requiring 2L O2 having completed remdesivir. She completed decadron 6/16 and returned to ED 6/19 due to worsening dyspnea, lethargy, found to be febrile to 101.43F with 4L O2 requirement. CXR demonstrated stable bilateral infiltrates with no new focal consolidation. She was lethargic, CT head unrevealing, and hyponatremic (Na 126). She was admitted, started back on steroids with improved mentation, strength, and given lasix empirically due to persistent desaturations worse with exertion. Hypoxemia worsened and she was transferred to SDU on [facility] 6/24 with subsequent PCCM consultation 6/25. Hypoxemia has remained severe and progressive.  Assessment & Plan: Principal Problem:   Acute on chronic respiratory failure with hypoxia (HCC) Active Problems:   Hypothyroidism   Essential hypertension, benign   Follicular lymphoma grade ii, lymph nodes of multiple sites ([place])   Hyponatremia  Acute on chronic hypoxic respiratory due to ARDS due to covid-19 pneumonia: Of note, pt had been fully immunized prior to contracting covid though IgG Ab's negative x2 here. CXR with persistent bilateral infiltrates consistent with ARDS. - Goals of care discussion with patient this morning due to progressive hypoxemia and beginning to have more significant subjective dyspnea as well. She continually defers the question of whether she would desire a trial of intubation vs. comfort measures were that to be recommended. She does clearly state DNR, though will not specifically state she would want intubation or not ("let's just hope it doesn't come to that.") Will consult palliative care to facilitate continued  discussions. - Hypoxemia is severe, remains at high risk of intubation. - s/p remesivir 6/7 - 6/11 - s/p decadron 6/7 - 6/16, restarted 6/19 >> augmented 6/26 and again 6/27.  - RVP negative.   - Continue isolation x21 days from positive test (6/7) - Continue supplemental oxygen, recommend proning if possible. Atelectasis likely also contributing, so IS and OOB encouraged.  - Clinical picture is consistent with severe covid pneumonia, possibly transitioning to organizing pneumonia/BOOP as cause of respiratory failure without acute rise in d-dimer. Do not believe CTA chest is practical with her level of respiratory distress and doubt it would change management. - Echo still pending, BNP elevated. In an effort to optimize chances at oxygenation, will give lasix BID today.  Relative adrenal insufficiency: Significant global symptomatic improvement with reinitiation of steroids.  - Will continue steroids with anticipated prolonged taper.  Pancytopenia: Lymphopenia on admission with normocytic anemia and thrombocytopenia. Likely related to viral infection with hemodilutional process with IVF. Splenomegaly related to lymphoma likely contributing. - Monitoring. Stable.  Hyponatremia: Worsened with diuresis.  - Continue monitoring in AM. Will hold lasix for now given broadening BUN:Cr.  Hypokalemia: Supplemented.  - Monitoring  Follicular lymphoma: follows with Dr. 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  Obesity: Estimated body mass index is 37.76 kg/m as calculated from the following:   Height as of this encounter: 5\' 4"  (1.626 m).   Weight as of this encounter: 99.8 kg.  DVT prophylaxis: Lovenox Code Status: Full Family Communication: Daughter by phone  daily Disposition Plan:  Remain in SDU on heated high flow oxygen.  Status is: Inpatient  Remains inpatient appropriate because:Inpatient level of care appropriate due to severity of illness and severe hypoxemia.   Dispo: The patient is from: Home              Anticipated d/c is to: TBD              Anticipated d/c date is: > 3 days              Patient currently is not medically stable to d/c.  Consultants:   Pulmonology 6/25  Procedures:   None  Antimicrobials:  Vancomycin, cefepime x1   Subjective: States she feels about the same but appears obviously more short of breath. SpO2 has been settling in 80%'s consistently, at times into 70%'s with any activity or coughing which is not improved with tessalon. No chest pain currently, states she's clear that she wouldn't want to be resuscitated if her heart were to stop beating or she were to stop breathing. If her respiratory status worsens, she is unwilling to decide whether she would want intubation or morphine.   Objective: Vitals:   06/17/20 0614 06/17/20 0625 06/17/20 0637 06/17/20 0800  BP:    (!) 149/98  Pulse: 82 83 76 72  Resp: (!) 31 (!) 21 (!) 27 (!) 25  Temp:      TempSrc:      SpO2: (!) 60% (!) 73% (!) 86% 93%  Weight:      Height:        Intake/Output Summary (Last 24 hours) at 06/17/2020 0851 Last data filed at 06/17/2020 1497 Gross per 24 hour  Intake 800.1 ml  Output 625 ml  Net 175.1 ml   Filed Weights   06/16/2020 1807  Weight: 99.8 kg   Gen: 79 y.o. female in moderate distress Pulm: Mildly labored at rest having just gotten back in bed. SpO2 80%'s. CV: Regular rate and rhythm. No murmur, rub, or gallop. No JVD, no dependent edema. GI: Abdomen soft, non-tender, non-distended, with normoactive bowel sounds.  Ext: Warm, no deformities Skin: No rashes, lesions or ulcers on visualized skin. Neuro: Alert and oriented. No focal neurological deficits. Psych: Judgement and insight appear fair. Mood  euthymic & affect congruent. Behavior is appropriate.    Data Reviewed: I have personally reviewed following labs and imaging studies  CBC: Recent Labs  Lab 06/13/20 1048 06/14/20 0454 06/15/20 0156 06/16/20 0249 06/17/20 0340  WBC 7.9 5.9 4.7 5.8 4.6  NEUTROABS  --  5.4 4.2 5.0 4.2  HGB 10.9* 9.4* 9.5* 11.2* 10.4*  HCT 32.2* 27.3* 28.3* 32.5* 30.7*  MCV 86.3 85.0 86.0 83.8 85.5  PLT 148* 126* 138* 144* 026   Basic Metabolic Panel: Recent Labs  Lab 06/11/20 0610 06/11/20 0610 06/12/20 0442 06/13/20 0519 06/14/20 0454 06/16/20 0249 06/17/20 0340  NA 135   < > 132* 136 134* 129* 131*  K 3.9   < > 4.5 5.1 4.7 4.1 3.8  CL 102   < > 100 99 101 89* 89*  CO2 25   < > 25 26 24 27 31   GLUCOSE 128*   < > 127* 111* 108* 129* 167*  BUN 13   < > 10 12 13 19 21   CREATININE 0.70   < > 0.62 0.64 0.57 0.67 0.58  CALCIUM 8.2*   < > 8.1* 8.6* 8.2* 8.1* 8.4*  MG 1.7  --   --   --  1.7 1.7  2.1  PHOS 2.7  --   --   --   --   --  3.5   < > = values in this interval not displayed.   GFR: Estimated Creatinine Clearance: 66.5 mL/min (by C-G formula based on SCr of 0.58 mg/dL). Liver Function Tests: Recent Labs  Lab 06/11/20 0610 06/12/20 0442 06/14/20 0454  AST 22 16 17   ALT 22 17 16   ALKPHOS 40 41 39  BILITOT 0.4 0.5 1.1  PROT 5.2* 5.1* 5.2*  ALBUMIN 2.5* 2.4* 2.5*   No results for input(s): LIPASE, AMYLASE in the last 168 hours. No results for input(s): AMMONIA in the last 168 hours. Coagulation Profile: No results for input(s): INR, PROTIME in the last 168 hours. Cardiac Enzymes: No results for input(s): CKTOTAL, CKMB, CKMBINDEX, TROPONINI in the last 168 hours. BNP (last 3 results) No results for input(s): PROBNP in the last 8760 hours. HbA1C: No results for input(s): HGBA1C in the last 72 hours. CBG: No results for input(s): GLUCAP in the last 168 hours. Lipid Profile: No results for input(s): CHOL, HDL, LDLCALC, TRIG, CHOLHDL, LDLDIRECT in the last 72 hours. Thyroid  Function Tests: No results for input(s): TSH, T4TOTAL, FREET4, T3FREE, THYROIDAB in the last 72 hours. Anemia Panel: No results for input(s): VITAMINB12, FOLATE, FERRITIN, TIBC, IRON, RETICCTPCT in the last 72 hours. Urine analysis:    Component Value Date/Time   COLORURINE YELLOW 06/04/2020 1812   APPEARANCEUR CLEAR 06/08/2020 1812   LABSPEC 1.015 05/23/2020 1812   PHURINE 6.0 06/15/2020 1812   GLUCOSEU NEGATIVE 05/24/2020 1812   HGBUR NEGATIVE 05/29/2020 1812   BILIRUBINUR NEGATIVE 06/06/2020 1812   KETONESUR NEGATIVE 06/17/2020 1812   PROTEINUR NEGATIVE 05/25/2020 1812   NITRITE NEGATIVE 06/14/2020 1812   LEUKOCYTESUR NEGATIVE 05/31/2020 1812   Recent Results (from the past 240 hour(s))  Urine culture     Status: None   Collection Time: 05/25/2020  6:12 PM   Specimen: In/Out Cath Urine  Result Value Ref Range Status   Specimen Description   Final    IN/OUT CATH URINE Performed at [facility], [place] [address], [place], [place] [address]    Special Requests   Final    NONE Performed at [facility], [place] [address], [place], [place] [address]    Culture   Final    NO GROWTH Performed at [facility], [place] [address], [place], [place] [address]    Report Status 06/11/2020 FINAL  Final  Blood Culture (routine x 2)     Status: None   Collection Time: 05/23/2020  6:34 PM   Specimen: BLOOD  Result Value Ref Range Status   Specimen Description   Final    BLOOD LEFT ANTECUBITAL Performed at [place] [address], [place], [place] [address]    Special Requests   Final    BOTTLES DRAWN AEROBIC AND ANAEROBIC Blood Culture adequate volume Performed at [facility] [address], [place], [place] [address]    Culture   Final    NO GROWTH 5 DAYS Performed at [facility], [place] [address] [place], [place] [address]    Report Status 06/14/2020 FINAL  Final  Blood Culture (routine x 2)      Status: None   Collection Time: 06/06/2020  6:36 PM   Specimen: BLOOD LEFT HAND  Result Value Ref Range Status   Specimen Description   Final    BLOOD LEFT HAND Performed at  [facility], [place] [address] [place], [place] [address]    Special Requests   Final    BOTTLES DRAWN AEROBIC AND ANAEROBIC Blood Culture adequate volume Performed at [facility] [address] [place], [place] [address]    Culture   Final    NO GROWTH 5 DAYS Performed at [facility], [place] [address], [place], [place] [address]    Report Status 06/14/2020 FINAL  Final  MRSA PCR Screening     Status: None   Collection Time: 06/14/20 11:21 PM   Specimen: Nasopharyngeal  Result Value Ref Range Status   MRSA by PCR NEGATIVE NEGATIVE Final    Comment:        The GeneXpert MRSA Assay (FDA approved for NASAL specimens only), is one component of a comprehensive MRSA colonization surveillance program. It is not intended to diagnose MRSA infection nor to guide or monitor treatment for MRSA infections. Performed at [facility], [place] [address] [place], [place] [address]   Respiratory Panel by PCR     Status: None   Collection Time: 06/15/20  5:45 PM   Specimen: Nasopharyngeal Swab; Respiratory  Result Value Ref Range Status   Adenovirus NOT DETECTED NOT DETECTED Final   Coronavirus 229E NOT DETECTED NOT DETECTED Final    Comment: (NOTE) The Coronavirus on the Respiratory Panel, DOES NOT test for the novel  Coronavirus (2019 nCoV)    Coronavirus HKU1 NOT DETECTED NOT DETECTED Final   Coronavirus NL63 NOT DETECTED NOT DETECTED Final   Coronavirus OC43 NOT DETECTED NOT DETECTED Final   Metapneumovirus NOT DETECTED NOT DETECTED Final   Rhinovirus / Enterovirus NOT DETECTED NOT DETECTED Final   Influenza A NOT DETECTED NOT DETECTED Final   Influenza B NOT DETECTED NOT DETECTED Final   Parainfluenza Virus 1 NOT DETECTED NOT DETECTED Final    Parainfluenza Virus 2 NOT DETECTED NOT DETECTED Final   Parainfluenza Virus 3 NOT DETECTED NOT DETECTED Final   Parainfluenza Virus 4 NOT DETECTED NOT DETECTED Final   Respiratory Syncytial Virus NOT DETECTED NOT DETECTED Final   Bordetella pertussis NOT DETECTED NOT DETECTED Final   Chlamydophila pneumoniae NOT DETECTED NOT DETECTED Final   Mycoplasma pneumoniae NOT DETECTED NOT DETECTED Final    Comment: Performed at [facility], [place]. [address], [place], [place] [address]      Radiology Studies: DG CHEST PORT 1 VIEW  Result Date: 06/17/2020 CLINICAL DATA:  Chest pain EXAM: PORTABLE CHEST 1 VIEW COMPARISON:  06/16/2020 FINDINGS: Unchanged bilateral airspace opacities, left-greater-than-right. Cardiomediastinal contours are normal. No sizable pleural effusion. IMPRESSION: Unchanged bilateral airspace opacities, left-greater-than-right. Electronically Signed   By: Ulyses Jarred M.D.   On: 06/17/2020 00:35   DG CHEST PORT 1 VIEW  Result Date: 06/16/2020 CLINICAL DATA:  Respiratory failure.  Follow-up exam. EXAM: PORTABLE CHEST 1 VIEW COMPARISON:  06/15/2020 and earlier studies. FINDINGS: Bilateral airspace lung opacities are unchanged from the most recent prior exam. No new lung abnormalities. No convincing pleural effusion and no pneumothorax. Cardiac silhouette is normal in size. IMPRESSION: 1. No significant change from the previous day's study. 2. Persistent bilateral airspace lung opacities consistent with multifocal pneumonia. Electronically Signed   By: Lajean Manes M.D.   On: 06/16/2020 08:35    Scheduled Meds: . benzonatate  100 mg Oral BID  . Chlorhexidine Gluconate Cloth  6 each Topical Daily  . cholecalciferol  2,000 Units Oral Daily  . enoxaparin (LOVENOX) injection  40 mg  Subcutaneous QHS  . furosemide  40 mg Intravenous BID  . levothyroxine  75 mcg Oral Q0600  . mouth rinse  15 mL Mouth Rinse BID  . methylPREDNISolone (SOLU-MEDROL) injection  60 mg Intravenous Q12H    Continuous Infusions:    LOS: 8 days   Time spent: 35 minutes.  Patrecia Pour, MD [facility] [email or web address] 06/17/2020, 8:51 AM

## 2020-06-17 NOTE — Progress Notes (Signed)
Patient had episode of desaturation this morning. She said that she sat up once she woke up and her coughing increased. I heard her coughing from the nurses station and saw her oxygen began to drop so I got her prn cough syrup to take into her. I had the patient supplement her HFNC with a NRB at 15lpm. The patient was trying to talk to me during this time and oxygen dropped as low at 57%. Currently up to 82% stilL using NRB and HFNC. Pulse ox was also switched out during this time.

## 2020-06-17 NOTE — Progress Notes (Signed)
°  D/w bedside nurse  - 96% on 40L on 70% - pusle ox and wob somewhat better. Did get high dose steroids earlier Did not try BiPAP yet because she was resting and having echo  Echo ef 75% and gr 1 diass dysfn  SBP 154  Plan  - continue high dose steroids and HHFNC and face mask  - try bipap qhs  - extra lasix dose tonight  - code status discussion in progress with [facility] -she is likely revert to full code per Dr Bonner Puna and ok for intubation     SIGNATURE    Dr. Brand Males, M.D., F.C.C.P,  Pulmonary and Critical Care Medicine Staff Physician, [place] Director - Interstitial Lung Disease  Program  Pulmonary [place] [facility] [place], [place], [address]  Pager: [phone], If no answer or between  15:00h - 7:00h: call [phone] Telephone: [phone]  6:09 PM 06/17/2020

## 2020-06-17 NOTE — Progress Notes (Signed)
  Echocardiogram 2D Echocardiogram has been performed.  Daegon Deiss G Attilio Zeitler 06/17/2020, 4:20 PM

## 2020-06-18 ENCOUNTER — Inpatient Hospital Stay (HOSPITAL_COMMUNITY): Payer: Medicare Other

## 2020-06-18 DIAGNOSIS — Z515 Encounter for palliative care: Secondary | ICD-10-CM

## 2020-06-18 DIAGNOSIS — J1282 Pneumonia due to coronavirus disease 2019: Secondary | ICD-10-CM

## 2020-06-18 DIAGNOSIS — J80 Acute respiratory distress syndrome: Secondary | ICD-10-CM

## 2020-06-18 DIAGNOSIS — U071 COVID-19: Principal | ICD-10-CM

## 2020-06-18 DIAGNOSIS — C8218 Follicular lymphoma grade II, lymph nodes of multiple sites: Secondary | ICD-10-CM

## 2020-06-18 DIAGNOSIS — R0603 Acute respiratory distress: Secondary | ICD-10-CM

## 2020-06-18 LAB — PHOSPHORUS: Phosphorus: 3.2 mg/dL (ref 2.5–4.6)

## 2020-06-18 LAB — BASIC METABOLIC PANEL
Anion gap: 13 (ref 5–15)
BUN: 23 mg/dL (ref 8–23)
CO2: 31 mmol/L (ref 22–32)
Calcium: 8.5 mg/dL — ABNORMAL LOW (ref 8.9–10.3)
Chloride: 90 mmol/L — ABNORMAL LOW (ref 98–111)
Creatinine, Ser: 0.65 mg/dL (ref 0.44–1.00)
GFR calc Af Amer: >60 mL/min (ref >60)
GFR calc non Af Amer: >60 mL/min (ref >60)
Glucose, Bld: 98 mg/dL (ref 70–99)
Potassium: 4 mmol/L (ref 3.5–5.1)
Sodium: 134 mmol/L — ABNORMAL LOW (ref 135–145)

## 2020-06-18 LAB — GLUCOSE, CAPILLARY
Glucose-Capillary: 128 mg/dL — ABNORMAL HIGH (ref 70–99)
Glucose-Capillary: 135 mg/dL — ABNORMAL HIGH (ref 70–99)
Glucose-Capillary: 137 mg/dL — ABNORMAL HIGH (ref 70–99)
Glucose-Capillary: 143 mg/dL — ABNORMAL HIGH (ref 70–99)
Glucose-Capillary: 149 mg/dL — ABNORMAL HIGH (ref 70–99)
Glucose-Capillary: 250 mg/dL — ABNORMAL HIGH (ref 70–99)
Glucose-Capillary: 93 mg/dL (ref 70–99)

## 2020-06-18 LAB — CBC WITH DIFFERENTIAL/PLATELET
Abs Immature Granulocytes: 0.25 10*3/uL — ABNORMAL HIGH (ref 0.00–0.07)
Basophils Absolute: 0 10*3/uL (ref 0.0–0.1)
Basophils Relative: 0 %
Eosinophils Absolute: 0 10*3/uL (ref 0.0–0.5)
Eosinophils Relative: 0 %
HCT: 31.9 % — ABNORMAL LOW (ref 36.0–46.0)
Hemoglobin: 10.9 g/dL — ABNORMAL LOW (ref 12.0–15.0)
Immature Granulocytes: 4 %
Lymphocytes Relative: 4 %
Lymphs Abs: 0.3 10*3/uL — ABNORMAL LOW (ref 0.7–4.0)
MCH: 29.1 pg (ref 26.0–34.0)
MCHC: 34.2 g/dL (ref 30.0–36.0)
MCV: 85.1 fL (ref 80.0–100.0)
Monocytes Absolute: 0.2 10*3/uL (ref 0.1–1.0)
Monocytes Relative: 3 %
Neutro Abs: 5.9 10*3/uL (ref 1.7–7.7)
Neutrophils Relative %: 89 %
Platelets: 218 10*3/uL (ref 150–400)
RBC: 3.75 MIL/uL — ABNORMAL LOW (ref 3.87–5.11)
RDW: 13.3 % (ref 11.5–15.5)
WBC: 6.6 10*3/uL (ref 4.0–10.5)
nRBC: 0 % (ref 0.0–0.2)

## 2020-06-18 LAB — C-REACTIVE PROTEIN: CRP: 1.7 mg/dL — ABNORMAL HIGH (ref <1.0)

## 2020-06-18 LAB — LEGIONELLA PNEUMOPHILA SEROGP 1 UR AG: L. pneumophila Serogp 1 Ur Ag: NEGATIVE

## 2020-06-18 LAB — D-DIMER, QUANTITATIVE: D-Dimer, Quant: 2.16 ug/mL-FEU — ABNORMAL HIGH (ref 0.00–0.50)

## 2020-06-18 NOTE — Progress Notes (Signed)
RRT Claiborne Billings and myself spoke to Ms. Eickholt concerning the confusion over her living will. I spoke to a family member who told me that they had retrieved a copy but that it is very basic and does not really highlight some of the more important things when it comes to the actual code status. I was told by her daughter, Katy Apo that the document mostly just talks about who is to make decisions for her if she becomes incapable of making decisions on her own. Ms. Maclachlan made it clear that her wishes at this time are to be a complete FULL CODE. Katy Apo will bring in copy of living will today for records.

## 2020-06-18 NOTE — Progress Notes (Signed)
PT Cancellation Note  Patient Details Name: Kristina Ramos MRN: [id number] DOB: 03/11/1941   Cancelled Treatment:    Reason Eval/Treat Not Completed: Medical issues which prohibited therapy, having a rough day, desats quickly per RN.    Claretha Cooper 06/18/2020, 10:48 AM [place] Pager [phone] Office [phone]

## 2020-06-18 NOTE — Progress Notes (Signed)
NAME:  Kristina Ramos, MRN:  [id number], DOB:  1941-04-18, LOS: 9 ADMISSION DATE:  05/30/2020, CONSULTATION DATE:  6/25 REFERRING MD:  Bonner Puna, CHIEF COMPLAINT:  dyspnea  Brief History   79 yo female admitted initially on 6/7 with COVID 19 pneumonia, discharged 6/11 and returned on 6/19 with worsening dyspnea.  PCCM consulted for worsening hypoxemic respiratory failure on 6/27.   Past Medical History  Anemia, arthritis, depression, follicular lymphoma, GERD, Heart murmur, adenomatous colonic polyps, hyperlipidemia, hypertension  Significant Hospital Events   01/26/2020>> Pfizer SARS COV-2 vaccine #1 02/20/2020 >>Pfizer SARS COV-2 vaccine #2 04/03/2020>> 6th cycle of maintenance Gazyva  xxxxxxxxxxx 6/7-6/11 Admission for Covid Pneumonia 6/7-6/11 Remdesivir treatment 6/11 Discharged home on decadron 06/06/2020 >> Re-admission for worsening dyspnea, fever, and increasing oxygen requirements 6/7-6/16 Decadron treatment ,  was restarted 6/19 06/15/2020>> Transfer to SDU for increasing oxygen demands, CXR with more pronounced airspace opacities / worsening atelectasis  6/25 - Currently on 30 L HHFNC ( 60%) Sats are 96%, RR is 18 Pt. Was speaking in full sentences  on phone and in no distress when I examined her. She desaturates with minimal exertion, and takes about 10 minutes to rebound to sats > 92% She " feels" when she is hypoxic.  WBC is 4.7 Procalcitonin is < 0.10 Na is 134 ( Up from 126 on admission )  CRP has up trended within the last 24 hours from 3.6 to 6.9 D-dimer down trended to  2.25 ( was 2.55 on  6/24) Oxygen demands have increased to 30 L HHFNC ( 60%) last 24 hours CXR 6/25 shows increase in airspace opacities , more pronounced and worsening atelectasis Net + 1255 since admission Good response to Lasix 40 today, with 2600 cc's out this am  6/26 - still on HHFNC and face mask. Afebrile. Last echo 2019. On dvt proph. Sitting and able to got to toilet but that leaves her tired.  Othewrise WOB ok  6/27 refused BIPAP, solumedrol dose adjusted to 183m q6h  Consults:  6/25 PCCM  Procedures:    Significant Diagnostic Tests:  6/19 CT head > NAICP 6/27 Echo > LVEF 70-75%, RV size and function is normal  Micro Data:  6/7 SARS COV2 > positive 6/19 blood > neg 6/19 urine >  6/24 RVP > neg 6/25 COVID IgG > negative 6/26 urine strep > negative  Antimicrobials:  6/19>> Vanc 2 grams x 1 dose 6/19-6/20>> Cefepime  Interim history/subjective:  6/27 refused BIPAP, solumedrol dose adjusted to [id number], says that she feels better, says she doesn't want a ventilator or chest compressions  Objective   Blood pressure (!) 162/82, pulse 74, temperature (!) 97.5 F (36.4 C), temperature source Axillary, resp. rate 18, height 5' 4"  (1.626 m), weight 99.8 kg, SpO2 92 %.    FiO2 (%):  [60 %-90 %] 70 %   Intake/Output Summary (Last 24 hours) at 06/18/2020 0735 Last data filed at 06/18/2020 0300 Gross per 24 hour  Intake --  Output 1800 ml  Net -1800 ml   Filed Weights   05/30/2020 1807  Weight: 99.8 kg    Examination:  General:  Resting comfortably in bed HENT: NCAT OP clear PULM: Few crackles basesB, normal effort CV: RRR, no mgr GI: BS+, soft, nontender MSK: normal bulk and tone Neuro: awake, alert, no distress, MAEW  6/28 CXR diffuse bilateral airspace disease left greater than right, personally reviewed  Resolved Hospital Problem list     Assessment & Plan:  ARDS due to  COVID 19 pneumonia: immunocompromised, prolonged course Given advanced age and immunocompromised state it is exceedingly unlikely she would survive mechanical ventilation Symptoms improved on 6/28 after increasing solumedrol Continue solumedrol as ordered Repeat Chest imaging periodically Off precautions tomorrow Continue lasix Continue heated high flow O2 in ICU environment Tolerate periods of hypoxemia, goal at rest is greater than 85% SaO2, with movement ideally above  75% Decision for intubation should be based on a change in mental status or physical evidence of ventilatory failure such as nasal flaring, accessory muscle use, paradoxical breathing Out of bed to chair as able Incentive spirometry is important, use every hour Prone positioning while in bed  Follicular lymphoma Hold immunosuppressive cancer treatment  CODE STATUS: Dr. Bonner Puna and I met with the patient today together and discussed the interventions of mechanical ventilation, chest compressions, and electric shocks as means of resuscitation in the event of respiratory or cardiac arrest.  I explained that it is unlikely she would survive should those interventions be needed based on our experience here in patient her age who are immunocompromised.  She indicated that she did not want either of these interventions, code status changed to DNR.  My understanding is that she was confused about this after previous similar conversations, so we will continue to discuss this with her if necessary.  Best practice:   Per [facility]  Labs   CBC: Recent Labs  Lab 06/14/20 0454 06/15/20 0156 06/16/20 0249 06/17/20 0340 06/18/20 0710  WBC 5.9 4.7 5.8 4.6 6.6  NEUTROABS 5.4 4.2 5.0 4.2 5.9  HGB 9.4* 9.5* 11.2* 10.4* 10.9*  HCT 27.3* 28.3* 32.5* 30.7* 31.9*  MCV 85.0 86.0 83.8 85.5 85.1  PLT 126* 138* 144* 177 620    Basic Metabolic Panel: Recent Labs  Lab 06/13/20 0519 06/14/20 0454 06/16/20 0249 06/17/20 0340 06/18/20 0710  NA 136 134* 129* 131* 134*  K 5.1 4.7 4.1 3.8 4.0  CL 99 101 89* 89* 90*  CO2 26 24 27 31 31   GLUCOSE 111* 108* 129* 167* 98  BUN 12 13 19 21 23   CREATININE 0.64 0.57 0.67 0.58 0.65  CALCIUM 8.6* 8.2* 8.1* 8.4* 8.5*  MG  --  1.7 1.7 2.1  --   PHOS  --   --   --  3.5 3.2   GFR: Estimated Creatinine Clearance: 66.5 mL/min (by C-G formula based on SCr of 0.65 mg/dL). Recent Labs  Lab 06/15/20 0156 06/16/20 0249 06/17/20 0340 06/18/20 0710  PROCALCITON <0.10  --    --   --   WBC 4.7 5.8 4.6 6.6    Liver Function Tests: Recent Labs  Lab 06/12/20 0442 06/14/20 0454  AST 16 17  ALT 17 16  ALKPHOS 41 39  BILITOT 0.5 1.1  PROT 5.1* 5.2*  ALBUMIN 2.4* 2.5*   No results for input(s): LIPASE, AMYLASE in the last 168 hours. No results for input(s): AMMONIA in the last 168 hours.  ABG    Component Value Date/Time   HCO3 27.1 06/14/2020 1835   O2SAT 93.3 06/08/2020 1835     Coagulation Profile: No results for input(s): INR, PROTIME in the last 168 hours.  Cardiac Enzymes: No results for input(s): CKTOTAL, CKMB, CKMBINDEX, TROPONINI in the last 168 hours.  HbA1C: No results found for: HGBA1C  CBG: Recent Labs  Lab 06/17/20 1702 06/17/20 1952 06/18/20 0009  GLUCAP 198* 242* 149*     Critical care time: 40 minutes     Roselie Awkward, MD [place] [facility] Pager: [phone]  Cell: [phone] If no response, call [phone]

## 2020-06-18 NOTE — Consult Note (Signed)
Consultation Note Date: 06/18/2020   Patient Name: Kristina Ramos  DOB: Jan 31, 1941  MRN: [id number]  Age / Sex: 79 y.o., female  PCP: Ann Held, DO Referring Physician: Patrecia Pour, MD  Reason for Consultation: Establishing goals of care and Psychosocial/spiritual support  HPI/Patient Profile: 79 y.o. female   admitted on 05/23/2020 with past  medical history significant for follicular lymphoma, hypertension, hypothyroidism, iron deficiency anemia, recent Covid pneumonia who presents with worsening weakness.  She was recently hospitalized from 6/7-6/11 for acute hypoxic respiratory failure secondary to COVID pneumonia.  Patient reportedly had already received her COVID vaccine.  She received 4 dose of remdesivir and was sent home on 10 more days of steroids and 2 L of oxygen.  States she finished her steroids yesterday.   Presented to the ER,  she denies any worsening shortness of breath although son reportedly said that her O2 saturation was 85% on 2 L and her breathing appear shallow and rapid.   Increased oxygen needs to maintain O2 sats.        Increased weakness and fatigue.    In the ED, she was febrile/ 101.1 require up to 4 L and appeared lethargic.  Today is day 8 of this hospitalization.  She remains on high flow oxygen to maintain O2 sats, she is weak.     Patient faces treatment option decisions, advanced directives decisions and anticipatory care needs.    Clinical Assessment and Goals of Care:  This NP Wadie Lessen reviewed medical records, received report from team, assessed the patient and then meet at the patient's bedside  to discuss diagnosis, prognosis, GOCs,  disposition and options.   Concept of Palliative Care was introduced as specialized medical care for people and their families living with serious illness.  If focuses on providing relief from the symptoms and stress  of a serious illness.  The goal is to improve quality of life for both the patient and the family.   A  discussion was had today regarding advanced directives.  Concepts specific to code status was had.   Values and goals of care important to patient and family were attempted to be elicited.   Created space and opportunity for patient to explore her thoughts and feelings regarding her current medical situation.  She tells me she " I just don't understand how this all happened".    Education offered regarding,  Questions and concerns addressed.     MOST form introduced.     I suggested with Ms Kristina Ramos that we will continue our conversation in the morning with her daughter, she will benefit from the support of family   I spoke to daughter/Kristina Ramos by telephone and discussed above concepts.  She plans to be at the  hospital tomorrow morning   We plan to meet at 0900.   She will bring in copy of advanced directive.   Patient  encouraged to call with questions or concerns.     PMT will continue to support holistically.  SUMMARY OF RECOMMENDATIONS    Code Status/Advance Care Planning:  Full code  Encouraged patient to consider DNR/DNI status understanding evidenced based poor outcomes in similar hospitalized patient, as the cause of arrest is likely associated with advanced chronic illness rather than an easily reversible acute cardio-pulmonary event.   Palliative Prophylaxis:   Aspiration, Bowel Regimen, Delirium Protocol, Frequent Pain Assessment and Oral Care  Additional Recommendations (Limitations, Scope, Preferences):  Full Scope Treatment  Psycho-social/Spiritual:   Desire for further Chaplaincy support:no   Prognosis:   Unable to determine at this time  Discharge Planning:  Patient tells me she "will not go to a nursing home".  "I wan to go home and even if I can't do anything I will be there"  "I can hire help"   To Be Determined      Primary  Diagnoses: Present on Admission: . Acute on chronic respiratory failure with hypoxia ([place]) . Hyponatremia . Essential hypertension, benign . Follicular lymphoma grade ii, lymph nodes of multiple sites ([place]) . Hypothyroidism   I have reviewed the medical record, interviewed the patient and family, and examined the patient. The following aspects are pertinent.  Past Medical History:  Diagnosis Date  . Anemia   . Arthritis   . Depression   . Follicular lymphoma grade ii, lymph nodes of multiple sites ([place]) 09/30/2018  . Follicular lymphoma grade ii, lymph nodes of multiple sites ([place]) 09/30/2018  . GERD (gastroesophageal reflux disease)   . Goals of care, counseling/discussion 09/30/2018  . Heart murmur   . Hx of adenomatous colonic polyps 07/28/2017  . Hyperlipidemia   . Hypertension   . 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 Thyroid disease    Social History   Socioeconomic History  . Marital status: Widowed    Spouse name: Not on file  . Number of children: 2  . Years of education: Not on file  . Highest education level: Not on file  Occupational History  . Occupation: retired  Tobacco Use  . Smoking status: Never Smoker  . Smokeless tobacco: Never Used  Vaping Use  . Vaping Use: Never used  Substance and Sexual Activity  . Alcohol use: No  . Drug use: No  . Sexual activity: Not on file  Other Topics Concern  . Not on file  Social History Narrative   Retired widowed 1 son one daughter    daughter lives in [place] and son lives in [place] in [place]   4 caffeinated beverages daily   Social Determinants of Health   Financial Resource Strain: Low Risk   . Difficulty of Paying Living Expenses: Not hard at all  Food Insecurity: No Food Insecurity  . Worried About Charity fundraiser in the Last Year: Never true  . Ran Out of Food in the Last Year: Never true  Transportation Needs: No Transportation Needs  . Lack of  Transportation (Medical): No  . Lack of Transportation (Non-Medical): No  Physical Activity:   . Days of Exercise per Week:   . Minutes of Exercise per Session:   Stress:   . Feeling of Stress :   Social Connections:   . Frequency of Communication with Friends and Family:   . Frequency of Social Gatherings with Friends and Family:   . Attends Religious Services:   . Active Member of Clubs or Organizations:   . Attends Archivist Meetings:   Marland Kitchen Marital Status:    Family History  Problem Relation Age of  Onset  . Diabetes Maternal Grandmother   . Diabetes Paternal Aunt   . Lung cancer Father        smoker  . Heart disease Father   . CAD Mother   . Cataracts Mother   . Thyroid disease Maternal Aunt   . Colon cancer Neg Hx   . Esophageal cancer Neg Hx   . Pancreatic cancer Neg Hx   . Rectal cancer Neg Hx   . Stomach cancer Neg Hx    Scheduled Meds: . ALPRAZolam  0.25 mg Oral Once  . benzonatate  100 mg Oral BID  . Chlorhexidine Gluconate Cloth  6 each Topical Daily  . cholecalciferol  2,000 Units Oral Daily  . enoxaparin (LOVENOX) injection  40 mg Subcutaneous QHS  . furosemide  40 mg Intravenous BID  . insulin aspart  3-9 Units Subcutaneous Q4H  . levothyroxine  75 mcg Oral Q0600  . mouth rinse  15 mL Mouth Rinse BID  . methylPREDNISolone (SOLU-MEDROL) injection  125 mg Intravenous Q6H   Continuous Infusions: PRN Meds:.acetaminophen, chlorpheniramine-HYDROcodone, guaiFENesin, senna Medications Prior to Admission:  Prior to Admission medications   Medication Sig Start Date End Date Taking? Authorizing Provider  amoxicillin (AMOXIL) 500 MG capsule Take 2,000 mg by mouth as needed. only for dental procedeure   Yes [provider]  Cholecalciferol (VITAMIN D) 2000 units CAPS Take 2,000 Units by mouth daily.    Yes [provider]  dexamethasone (DECADRON) 6 MG tablet Take 1 tablet (6 mg total) by mouth daily. 06/01/20  Yes Patrecia Pour, MD   famciclovir (FAMVIR) 250 MG tablet TAKE 1 TABLET BY MOUTH EVERY DAY Patient taking differently: Take 250 mg by mouth daily.  09/08/19  Yes Ennever, Rudell Cobb, MD  hydrochlorothiazide (HYDRODIURIL) 12.5 MG tablet TAKE 1 TABLET BY MOUTH EVERY DAY Patient taking differently: Take 12.5 mg by mouth daily.  01/11/20  Yes Ann Held, DO  levothyroxine (SYNTHROID) 75 MCG tablet Take 1 tablet (75 mcg total) by mouth daily. 01/25/20  Yes Roma Schanz R, DO  lisinopril (ZESTRIL) 10 MG tablet TAKE 1 TABLET BY MOUTH EVERY DAY Patient taking differently: Take 10 mg by mouth daily.  01/11/20  Yes Roma Schanz R, DO  loperamide (IMODIUM) 2 MG capsule Take 1 capsule (2 mg total) by mouth as needed for diarrhea or loose stools. 06/01/20  Yes Patrecia Pour, MD   Allergies  Allergen Reactions  . Aspirin Other (See Comments)    REACTION: pt states that it irritates her stomach, coated ASA is ok   Review of Systems  Constitutional: Positive for fever.  Respiratory: Positive for shortness of breath.   Neurological: Positive for weakness.    Physical Exam Constitutional:      Appearance: She is ill-appearing.  Cardiovascular:     Rate and Rhythm: Normal rate.  Pulmonary:     Effort: Tachypnea present.  Neurological:     Mental Status: She is alert.     Vital Signs: BP (!) 162/82   Pulse 83   Temp 97.7 F (36.5 C) (Oral)   Resp (!) 24   Ht 5\' 4"  (1.626 m)   Wt 99.8 kg   SpO2 92%   BMI 37.76 kg/m  Pain Scale: 0-10 POSS *See Group Information*: S-Acceptable,Sleep, easy to arouse Pain Score: 0-No pain   SpO2: SpO2: 92 % O2 Device:SpO2: 92 % O2 Flow Rate: .O2 Flow Rate (L/min): 40 L/min  IO: Intake/output summary:   Intake/Output Summary (  Last 24 hours) at 06/18/2020 1342 Last data filed at 06/18/2020 1341 Gross per 24 hour  Intake 500 ml  Output 1450 ml  Net -950 ml    LBM: Last BM Date: 06/17/20 Baseline Weight: Weight: 99.8 kg Most recent weight: Weight: 99.8 kg      Palliative Assessment/Data:  30 %   Discussed with Dr Bonner Puna  Time In: 1200 Time Out: 1310 Time Total: 70 minutes Greater than 50%  of this time was spent counseling and coordinating care related to the above assessment and plan.  Signed by: Wadie Lessen, NP   Please contact Palliative Medicine Team phone at [phone] for questions and concerns.  For individual provider: See Shea Evans

## 2020-06-18 NOTE — Progress Notes (Signed)
PROGRESS NOTE  Lodie Waheed Dorion  WER:[id number] DOB: 01/18/1941 DOA: 06/13/2020 PCP: Ann Held, DO   Brief Narrative: Annaleah Arata is a 79 y.o. female with a history of follicular lymphoma, HTN, hypothyroidism, iron deficiency, and hospitalization 6/7-6/11 for covid-19 pneumonia discharged on decadron requiring 2L O2 having completed remdesivir. She completed decadron 6/16 and returned to ED 6/19 due to worsening dyspnea, lethargy, found to be febrile to 101.71F with 4L O2 requirement. CXR demonstrated stable bilateral infiltrates with no new focal consolidation. She was lethargic, CT head unrevealing, and hyponatremic (Na 126). She was admitted, started back on steroids with improved mentation, strength, and given lasix empirically due to persistent desaturations worse with exertion. Hypoxemia worsened and she was transferred to SDU on [facility] 6/24 with subsequent PCCM consultation 6/25. Hypoxemia has remained severe and progressive. Goals of care conversations have been started, though the patient is resistant. Palliative care is consulted.   Assessment & Plan: Principal Problem:   Acute on chronic respiratory failure with hypoxia (HCC) Active Problems:   Hypothyroidism   Essential hypertension, benign   Follicular lymphoma grade ii, lymph nodes of multiple sites ([place])   Hyponatremia  Acute on chronic hypoxic respiratory due to ARDS due to covid-19 pneumonia: Of note, pt had been fully immunized prior to contracting covid though IgG Ab's negative x2 here. CXR with persistent bilateral infiltrates consistent with ARDS. - Spoke with the patient alongside Dr. [place] Bells regarding her treatment plan as it relates to her goals of care. Her goals are to return home and live her normal life. It was explained that this outcome would not be possible in the event of a cardiac or respiratory arrest regardless of ROSC and in the event that hypoxemia progressed to requiring intubation, our experience is  clear that meaningful survival has not been seen in this setting. The best outcomes in these scenarios would be prolonged ventilator-dependence with minimal chance at coming off the vent. She agrees that this is not an outcome she wants. However, when later pressed, she still states she doesn't want to be DNR. Palliative care is consulted for assistance, and [place] discussions will be ongoing. Anticipating family visitation 6/29 when she comes off isolation which will likely assist with these conversations. For now, full code. Would not recommend intubation.  - s/p remesivir 6/7 - 6/11 - s/p decadron 6/7 - 6/16, restarted 6/19 >> augmented 6/26 and again 6/27 with subsequent improvement in symptoms and CRP. Will continue high dose.  - RVP negative.   - DC isolation 6/29.  - Continue supplemental oxygen, recommend proning if possible. CXR personally reviewed this AM shows stability in bilateral widespread opacities.   Acute on chronic HFpEF: Mild exacerbation was treated empirically. LVEF is hyperdynamic on echo with G1DD and normal RV and IVC.  - Will DC lasix. Appears euvolemic.   Relative adrenal insufficiency: Significant global symptomatic improvement with reinitiation of steroids.  - Will continue steroids as above  Pancytopenia: Lymphopenia on admission with normocytic anemia and thrombocytopenia. Likely related to viral infection with hemodilutional process with IVF. Splenomegaly related to lymphoma likely contributing. - Monitoring. Much improved.  Hyponatremia: Worsened with diuresis. Improving.  Hypokalemia: Supplemented.  - Monitoring, none needed today.  Follicular lymphoma: follows with Dr. 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  Obesity: Estimated body  mass index is 37.76 kg/m as calculated from the following:   Height as of this encounter: 5\' 4"  (1.626 m).   Weight as of this encounter: 99.8 kg.  DVT prophylaxis: Lovenox Code Status: Full Family Communication: Daughter by phone daily Disposition Plan: Remain in SDU on heated high flow oxygen.  Status is: Inpatient  Remains inpatient appropriate because:Inpatient level of care appropriate due to severity of illness and severe hypoxemia.   Dispo: The patient is from: Home              Anticipated d/c is to: TBD              Anticipated d/c date is: > 3 days              Patient currently is not medically stable to d/c.  Consultants:   Pulmonology 6/25  Palliative care 6/27  Procedures:   Echocardiogram 06/17/2020:  1. Left ventricular ejection fraction, by estimation, is 70 to 75%. The  left ventricle has hyperdynamic function. The left ventricle has no  regional wall motion abnormalities. Left ventricular diastolic parameters  are consistent with Grade I diastolic  dysfunction (impaired relaxation).  2. Right ventricular systolic function is normal. The right ventricular  size is normal.  3. The mitral valve is normal in structure. No evidence of mitral valve  regurgitation. No evidence of mitral stenosis.  4. The aortic valve is normal in structure. Aortic valve regurgitation is  not visualized. No aortic stenosis is present.  5. The inferior vena cava is normal in size with greater than 50%  respiratory variability, suggesting right atrial pressure of 3 mmHg.   Antimicrobials:  Vancomycin, cefepime x1   Subjective: Feels better today, sounds better to her daughter on the phone. Denies chest pain or any other neg symptoms. Did not at all tolerate BiPAP mask last night.   Objective: Vitals:   06/18/20 0937 06/18/20 1000 06/18/20 1100 06/18/20 1200  BP:      Pulse:  98 (!) 102 83  Resp:   20 (!) 24  Temp:    97.7 F (36.5 C)  TempSrc:    Oral  SpO2: (!)  85% (!) 81% (!) 66% 92%  Weight:      Height:        Intake/Output Summary (Last 24 hours) at 06/18/2020 1651 Last data filed at 06/18/2020 1341 Gross per 24 hour  Intake 500 ml  Output 1450 ml  Net -950 ml   Filed Weights   06/12/2020 1807  Weight: 99.8 kg   Gen: 79 y.o. female in less distress Pulm: Mildly labored on HHF, crackles at bases, stable. CV: Regular tachycardia. No murmur, rub, or gallop. No JVD, no dependent edema. GI: Abdomen soft, non-tender, non-distended, with normoactive bowel sounds.  Ext: Warm, no deformities Skin: No rashes, lesions or ulcers on visualized skin. Neuro: Alert and oriented. No focal neurological deficits. Psych: Judgement and insight appear fair. Mood euthymic & affect congruent. Behavior is appropriate.    Data Reviewed: I have personally reviewed following labs and imaging studies  CBC: Recent Labs  Lab 06/14/20 0454 06/15/20 0156 06/16/20 0249 06/17/20 0340 06/18/20 0710  WBC 5.9 4.7 5.8 4.6 6.6  NEUTROABS 5.4 4.2 5.0 4.2 5.9  HGB 9.4* 9.5* 11.2* 10.4* 10.9*  HCT 27.3* 28.3* 32.5* 30.7* 31.9*  MCV 85.0 86.0 83.8 85.5 85.1  PLT 126* 138* 144* 177 631   Basic Metabolic Panel: Recent Labs  Lab 06/13/20  2130 06/14/20 0454 06/16/20 0249 06/17/20 0340 06/18/20 0710  NA 136 134* 129* 131* 134*  K 5.1 4.7 4.1 3.8 4.0  CL 99 101 89* 89* 90*  CO2 26 24 27 31 31   GLUCOSE 111* 108* 129* 167* 98  BUN 12 13 19 21 23   CREATININE 0.64 0.57 0.67 0.58 0.65  CALCIUM 8.6* 8.2* 8.1* 8.4* 8.5*  MG  --  1.7 1.7 2.1  --   PHOS  --   --   --  3.5 3.2   GFR: Estimated Creatinine Clearance: 66.5 mL/min (by C-G formula based on SCr of 0.65 mg/dL). Liver Function Tests: Recent Labs  Lab 06/12/20 0442 06/14/20 0454  AST 16 17  ALT 17 16  ALKPHOS 41 39  BILITOT 0.5 1.1  PROT 5.1* 5.2*  ALBUMIN 2.4* 2.5*   No results for input(s): LIPASE, AMYLASE in the last 168 hours. No results for input(s): AMMONIA in the last 168 hours. Coagulation  Profile: No results for input(s): INR, PROTIME in the last 168 hours. Cardiac Enzymes: No results for input(s): CKTOTAL, CKMB, CKMBINDEX, TROPONINI in the last 168 hours. BNP (last 3 results) No results for input(s): PROBNP in the last 8760 hours. HbA1C: No results for input(s): HGBA1C in the last 72 hours. CBG: Recent Labs  Lab 06/17/20 1952 06/18/20 0009 06/18/20 0345 06/18/20 0822 06/18/20 1145  GLUCAP 242* 149* 128* 93 250*   Lipid Profile: No results for input(s): CHOL, HDL, LDLCALC, TRIG, CHOLHDL, LDLDIRECT in the last 72 hours. Thyroid Function Tests: No results for input(s): TSH, T4TOTAL, FREET4, T3FREE, THYROIDAB in the last 72 hours. Anemia Panel: No results for input(s): VITAMINB12, FOLATE, FERRITIN, TIBC, IRON, RETICCTPCT in the last 72 hours. Urine analysis:    Component Value Date/Time   COLORURINE YELLOW 06/15/2020 1812   APPEARANCEUR CLEAR 06/02/2020 1812   LABSPEC 1.015 05/23/2020 1812   PHURINE 6.0 06/08/2020 1812   GLUCOSEU NEGATIVE 05/22/2020 1812   HGBUR NEGATIVE 05/23/2020 1812   BILIRUBINUR NEGATIVE 05/31/2020 1812   KETONESUR NEGATIVE 06/07/2020 1812   PROTEINUR NEGATIVE 06/19/2020 1812   NITRITE NEGATIVE 05/23/2020 1812   LEUKOCYTESUR NEGATIVE 06/12/2020 1812   Recent Results (from the past 240 hour(s))  Urine culture     Status: None   Collection Time: 05/30/2020  6:12 PM   Specimen: In/Out Cath Urine  Result Value Ref Range Status   Specimen Description   Final    IN/OUT CATH URINE Performed at [facility], [place] [address], [place], [place] [address]    Special Requests   Final    NONE Performed at [facility], [place] [address], [place], [place] [address]    Culture   Final    NO GROWTH Performed at [facility], [place] [address] [place], [place] [address]    Report Status 06/11/2020 FINAL  Final  Blood Culture (routine x 2)     Status: None   Collection Time: 06/01/2020  6:34 PM   Specimen:  BLOOD  Result Value Ref Range Status   Specimen Description   Final    BLOOD LEFT ANTECUBITAL Performed at [place] [address], [place], [place] [address]    Special Requests   Final    BOTTLES DRAWN AEROBIC AND ANAEROBIC Blood Culture adequate volume Performed at [place] [address], [place], [place] [address]    Culture   Final    NO GROWTH 5 DAYS Performed at [facility],  [address] [place], [place] [address]    Report Status 06/14/2020 FINAL  Final  Blood Culture (routine x 2)     Status: None   Collection Time: 05/23/2020  6:36 PM   Specimen: BLOOD LEFT HAND  Result Value Ref Range Status   Specimen Description   Final    BLOOD LEFT HAND Performed at [place] [address], [place], [place] [address]    Special Requests   Final    BOTTLES DRAWN AEROBIC AND ANAEROBIC Blood Culture adequate volume Performed at [place] [address] [place], [place] [address]    Culture   Final    NO GROWTH 5 DAYS Performed at [facility], [place] [address] [place], [place] [address]    Report Status 06/14/2020 FINAL  Final  MRSA PCR Screening     Status: None   Collection Time: 06/14/20 11:21 PM   Specimen: Nasopharyngeal  Result Value Ref Range Status   MRSA by PCR NEGATIVE NEGATIVE Final    Comment:        The GeneXpert MRSA Assay (FDA approved for NASAL specimens only), is one component of a comprehensive MRSA colonization surveillance program. It is not intended to diagnose MRSA infection nor to guide or monitor treatment for MRSA infections. Performed at [facility], [place] [address] [place], [place] [address]   Respiratory Panel by PCR     Status: None   Collection Time: 06/15/20  5:45 PM   Specimen: Nasopharyngeal Swab; Respiratory  Result Value Ref Range Status   Adenovirus NOT DETECTED NOT DETECTED Final   Coronavirus 229E NOT  DETECTED NOT DETECTED Final    Comment: (NOTE) The Coronavirus on the Respiratory Panel, DOES NOT test for the novel  Coronavirus (2019 nCoV)    Coronavirus HKU1 NOT DETECTED NOT DETECTED Final   Coronavirus NL63 NOT DETECTED NOT DETECTED Final   Coronavirus OC43 NOT DETECTED NOT DETECTED Final   Metapneumovirus NOT DETECTED NOT DETECTED Final   Rhinovirus / Enterovirus NOT DETECTED NOT DETECTED Final   Influenza A NOT DETECTED NOT DETECTED Final   Influenza B NOT DETECTED NOT DETECTED Final   Parainfluenza Virus 1 NOT DETECTED NOT DETECTED Final   Parainfluenza Virus 2 NOT DETECTED NOT DETECTED Final   Parainfluenza Virus 3 NOT DETECTED NOT DETECTED Final   Parainfluenza Virus 4 NOT DETECTED NOT DETECTED Final   Respiratory Syncytial Virus NOT DETECTED NOT DETECTED Final   Bordetella pertussis NOT DETECTED NOT DETECTED Final   Chlamydophila pneumoniae NOT DETECTED NOT DETECTED Final   Mycoplasma pneumoniae NOT DETECTED NOT DETECTED Final    Comment: Performed at [facility], [place]. [address], [place], [place] [address]      Radiology Studies: DG CHEST PORT 1 VIEW  Result Date: 06/18/2020 CLINICAL DATA:  COVID-19 infection EXAM: PORTABLE CHEST 1 VIEW COMPARISON:  Yesterday FINDINGS: Patchy bilateral pulmonary infiltrate correlating with history. Stable heart size. No visible effusion or air leak. IMPRESSION: Stable bilateral pneumonia. Electronically Signed   By: Monte Fantasia M.D.   On: 06/18/2020 06:32   DG CHEST PORT 1 VIEW  Result Date: 06/17/2020 CLINICAL DATA:  Chest pain EXAM: PORTABLE CHEST 1 VIEW COMPARISON:  06/16/2020 FINDINGS: Unchanged bilateral airspace opacities, left-greater-than-right. Cardiomediastinal contours are normal. No sizable pleural effusion. IMPRESSION: Unchanged bilateral airspace opacities, left-greater-than-right. Electronically Signed   By: Ulyses Jarred M.D.   On: 06/17/2020 00:35   ECHOCARDIOGRAM COMPLETE  Result Date: 06/17/2020  ECHOCARDIOGRAM REPORT   Patient Name:   Ronique REBA Santori Date of Exam: 06/17/2020 Medical Rec #:  [id number]      Height:       64.0 in Accession #:    [id number]     Weight:       220.0 lb Date of Birth:  [birth year]       BSA:          2.037 m Patient Age:    79 years       BP:           159/73 mmHg Patient Gender: F              HR:           89 bpm. Exam Location:  Inpatient Procedure: 2D Echo, Cardiac Doppler and Color Doppler Indications:    Acute Respiratory Insufficiency 518.82 / R06.89  History:        Patient has prior history of Echocardiogram examinations, most                 recent 01/13/2018. Signs/Symptoms:Murmur; Risk                 Factors:Hypertension and Dyslipidemia. Thyroid Disease. GERD.  Sonographer:    Jonelle Sidle Dance Referring Phys: [place]  1. Left ventricular ejection fraction, by estimation, is 70 to 75%. The left ventricle has hyperdynamic function. The left ventricle has no regional wall motion abnormalities. Left ventricular diastolic parameters are consistent with Grade I diastolic dysfunction (impaired relaxation).  2. Right ventricular systolic function is normal. The right ventricular size is normal.  3. The mitral valve is normal in structure. No evidence of mitral valve regurgitation. No evidence of mitral stenosis.  4. The aortic valve is normal in structure. Aortic valve regurgitation is not visualized. No aortic stenosis is present.  5. The inferior vena cava is normal in size with greater than 50% respiratory variability, suggesting right atrial pressure of 3 mmHg. FINDINGS  Left Ventricle: Left ventricular ejection fraction, by estimation, is 70 to 75%. The left ventricle has hyperdynamic function. The left ventricle has no regional wall motion abnormalities. The left ventricular internal cavity size was normal in size. There is no left ventricular hypertrophy. Left ventricular diastolic parameters are consistent with Grade I diastolic dysfunction  (impaired relaxation). Normal left ventricular filling pressure. Right Ventricle: The right ventricular size is normal. No increase in right ventricular wall thickness. Right ventricular systolic function is normal. Left Atrium: Left atrial size was normal in size. Right Atrium: Right atrial size was normal in size. Pericardium: There is no evidence of pericardial effusion. Mitral Valve: The mitral valve is normal in structure. Normal mobility of the mitral valve leaflets. No evidence of mitral valve regurgitation. No evidence of mitral valve stenosis. Tricuspid Valve: The tricuspid valve is normal in structure. Tricuspid valve regurgitation is not demonstrated. No evidence of tricuspid stenosis. Aortic Valve: The aortic valve is normal in structure. Aortic valve regurgitation is not visualized. No aortic stenosis is present. Pulmonic Valve: The pulmonic valve was normal in structure. Pulmonic valve regurgitation is not visualized. No evidence of pulmonic stenosis. Aorta: The aortic root is normal in size and structure. Venous: IVC assessment for right atrial pressure unable to be performed due to mechanical ventilation. The inferior vena cava is normal in size with greater than 50% respiratory variability, suggesting right atrial pressure of 3 mmHg. IAS/Shunts: No atrial level shunt detected by color flow Doppler.  LEFT VENTRICLE  PLAX 2D LVIDd:         4.00 cm  Diastology LVIDs:         2.90 cm  LV e' lateral:   6.53 cm/s LV PW:         1.00 cm  LV E/e' lateral: 8.7 LV IVS:        1.10 cm  LV e' medial:    4.79 cm/s LVOT diam:     2.00 cm  LV E/e' medial:  11.9 LV SV:         77 LV SV Index:   38 LVOT Area:     3.14 cm  RIGHT VENTRICLE             IVC RV Basal diam:  2.30 cm     IVC diam: 2.10 cm RV S prime:     15.20 cm/s TAPSE (M-mode): 1.5 cm LEFT ATRIUM             Index       RIGHT ATRIUM           Index LA diam:        3.50 cm 1.72 cm/m  RA Area:     10.10 cm LA Vol (A2C):   28.6 ml 14.04 ml/m RA Volume:    16.80 ml  8.25 ml/m LA Vol (A4C):   31.5 ml 15.46 ml/m LA Biplane Vol: 31.2 ml 15.31 ml/m  AORTIC VALVE LVOT Vmax:   116.00 cm/s LVOT Vmean:  83.800 cm/s LVOT VTI:    0.246 m  AORTA Ao Root diam: 2.90 cm Ao Asc diam:  3.40 cm MITRAL VALVE MV Area (PHT): 2.56 cm    SHUNTS MV Decel Time: 296 msec    Systemic VTI:  0.25 m MV E velocity: 56.90 cm/s  Systemic Diam: 2.00 cm MV A velocity: 69.20 cm/s MV E/A ratio:  0.82 Mihai Croitoru MD Electronically signed by Sanda Klein MD Signature Date/Time: 06/17/2020/4:51:53 PM    Final    VAS [place] LOWER EXTREMITY VENOUS (DVT)  Result Date: 06/17/2020  Lower Venous DVTStudy Indications: Edema.  Risk Factors: COVID 19 positive. Limitations: Poor ultrasound/tissue interface and body habitus. Comparison Study: No prior studies. Performing Technologist: Oliver Hum RVT  Examination Guidelines: A complete evaluation includes B-mode imaging, spectral Doppler, color Doppler, and power Doppler as needed of all accessible portions of each vessel. Bilateral testing is considered an integral part of a complete examination. Limited examinations for reoccurring indications may be performed as noted. The reflux portion of the exam is performed with the patient in reverse Trendelenburg.  +---------+---------------+---------+-----------+----------+--------------+ RIGHT    CompressibilityPhasicitySpontaneityPropertiesThrombus Aging +---------+---------------+---------+-----------+----------+--------------+ CFV      Full           Yes      Yes                                 +---------+---------------+---------+-----------+----------+--------------+ SFJ      Full                                                        +---------+---------------+---------+-----------+----------+--------------+ FV Prox  Full                                                        +---------+---------------+---------+-----------+----------+--------------+  FV Mid   Full                                                         +---------+---------------+---------+-----------+----------+--------------+ FV DistalFull                                                        +---------+---------------+---------+-----------+----------+--------------+ PFV      Full                                                        +---------+---------------+---------+-----------+----------+--------------+ POP      Full           Yes      Yes                                 +---------+---------------+---------+-----------+----------+--------------+ PTV      Full                                                        +---------+---------------+---------+-----------+----------+--------------+ PERO     Full                                                        +---------+---------------+---------+-----------+----------+--------------+   +---------+---------------+---------+-----------+----------+--------------+ LEFT     CompressibilityPhasicitySpontaneityPropertiesThrombus Aging +---------+---------------+---------+-----------+----------+--------------+ CFV      Full           Yes      Yes                                 +---------+---------------+---------+-----------+----------+--------------+ SFJ      Full                                                        +---------+---------------+---------+-----------+----------+--------------+ FV Prox  Full                                                        +---------+---------------+---------+-----------+----------+--------------+ FV Mid   Full                                                        +---------+---------------+---------+-----------+----------+--------------+  FV DistalFull                                                        +---------+---------------+---------+-----------+----------+--------------+ PFV      Full                                                         +---------+---------------+---------+-----------+----------+--------------+ POP      Full           Yes      Yes                                 +---------+---------------+---------+-----------+----------+--------------+ PTV      Full                                                        +---------+---------------+---------+-----------+----------+--------------+ PERO     Full                                                        +---------+---------------+---------+-----------+----------+--------------+     Summary: RIGHT: - There is no evidence of deep vein thrombosis in the lower extremity.  - No cystic structure found in the popliteal fossa.  LEFT: - There is no evidence of deep vein thrombosis in the lower extremity.  - No cystic structure found in the popliteal fossa.  *See table(s) above for measurements and observations. Electronically signed by Deitra Mayo MD on 06/17/2020 at 6:52:27 PM.    Final     Scheduled Meds: . ALPRAZolam  0.25 mg Oral Once  . benzonatate  100 mg Oral BID  . Chlorhexidine Gluconate Cloth  6 each Topical Daily  . cholecalciferol  2,000 Units Oral Daily  . enoxaparin (LOVENOX) injection  40 mg Subcutaneous QHS  . furosemide  40 mg Intravenous BID  . insulin aspart  3-9 Units Subcutaneous Q4H  . levothyroxine  75 mcg Oral Q0600  . mouth rinse  15 mL Mouth Rinse BID  . methylPREDNISolone (SOLU-MEDROL) injection  125 mg Intravenous Q6H   Continuous Infusions:    LOS: 9 days   Time spent: 35 minutes.  Patrecia Pour, MD [facility] [email or web address] 06/18/2020, 4:51 PM

## 2020-06-18 NOTE — Progress Notes (Signed)
Pt desats easily.  Was down to 60% just standing and turning to get in chair.  Required NRB over her HFLC to recover.

## 2020-06-19 DIAGNOSIS — R0603 Acute respiratory distress: Secondary | ICD-10-CM

## 2020-06-19 LAB — GLUCOSE, CAPILLARY
Glucose-Capillary: 128 mg/dL — ABNORMAL HIGH (ref 70–99)
Glucose-Capillary: 119 mg/dL — ABNORMAL HIGH (ref 70–99)
Glucose-Capillary: 210 mg/dL — ABNORMAL HIGH (ref 70–99)
Glucose-Capillary: 182 mg/dL — ABNORMAL HIGH (ref 70–99)
Glucose-Capillary: 200 mg/dL — ABNORMAL HIGH (ref 70–99)
Glucose-Capillary: 145 mg/dL — ABNORMAL HIGH (ref 70–99)

## 2020-06-19 LAB — C-REACTIVE PROTEIN: CRP: 0.8 mg/dL (ref <1.0)

## 2020-06-19 NOTE — Plan of Care (Signed)
  Problem: Elimination: Goal: Will not experience complications related to bowel motility Outcome: Progressing   Problem: Skin Integrity: Goal: Risk for impaired skin integrity will decrease Outcome: Progressing   

## 2020-06-19 NOTE — Progress Notes (Signed)
PROGRESS NOTE  Marshayla Mitschke Ragain  SWH:[id number] DOB: Apr 25, 1941 DOA: 05/30/2020 PCP: Ann Held, DO   Brief Narrative: Jolyn Deshmukh is a 79 y.o. female with a history of follicular lymphoma, HTN, hypothyroidism, iron deficiency, and hospitalization 6/7-6/11 for covid-19 pneumonia discharged on decadron requiring 2L O2 having completed remdesivir. She completed decadron 6/16 and returned to ED 6/19 due to worsening dyspnea, lethargy, found to be febrile to 101.35F with 4L O2 requirement. CXR demonstrated stable bilateral infiltrates with no new focal consolidation. She was lethargic, CT head unrevealing, and hyponatremic (Na 126). She was admitted, started back on steroids with improved mentation, strength, and given lasix empirically due to persistent desaturations worse with exertion. Hypoxemia worsened and she was transferred to SDU on [facility] 6/24 with subsequent PCCM consultation 6/25. Hypoxemia has remained severe and progressive. Goals of care conversations have been ongoing with medical team and PCCM with assistance of palliative care consultant.  Assessment & Plan: Principal Problem:   Acute on chronic respiratory failure with hypoxia (HCC) Active Problems:   Hypothyroidism   Essential hypertension, benign   DNR (do not resuscitate) discussion   Follicular lymphoma grade ii, lymph nodes of multiple sites ([place])   COVID-19 virus infection   Hyponatremia   Palliative care by specialist   Acute respiratory distress  Acute on chronic hypoxic respiratory due to ARDS due to covid-19 pneumonia: Of note, pt had been fully immunized prior to contracting covid (2/4 and 3/1 Pfizer) though IgG Ab's negative x2 here. CXR with persistent bilateral infiltrates consistent with ARDS. - s/p remesivir 6/7 - 6/11 - s/p decadron 6/7 - 6/16, restarted 6/19 >> augmented 6/26 and again 6/27. CRP has normalized. Taper steroids per PCCM starting 6/30. - Off isolation as of 6/29. - Continue supplemental  oxygen with permissive hypoxemia for mobility. Patient would not benefit from escalation of care (i.e. mechanical ventilation) so this is not in plan of care.    Acute on chronic HFpEF: Mild exacerbation was treated empirically. LVEF is hyperdynamic on echo with G1DD and normal RV and IVC.  - Holding further diuresis. Appears euvolemic.   Relative adrenal insufficiency: Significant global symptomatic improvement with reinitiation of steroids.  - Will continue steroids as above  Pancytopenia: Lymphopenia on admission with normocytic anemia and thrombocytopenia. Likely related to viral infection with hemodilutional process with IVF. Splenomegaly related to lymphoma likely contributing. - Monitoring. Much improved.  Hyponatremia: Worsened with diuresis. Improving.  Hypokalemia: Supplemented.  - Will continue monitoring 6/30.  Follicular lymphoma: follows with Dr. 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  Obesity: Estimated body mass index is 37.76 kg/m as calculated from the following:   Height as of this encounter: 5\' 4"  (1.626 m).   Weight as of this encounter: 99.8 kg.  DVT prophylaxis: Lovenox Code Status: DNR. Interdisciplinary family meeting held 06/19/2020. Family Communication: Daughter and Son at bedside this AM. Disposition Plan: Remain in SDU on heated high flow oxygen.  Status is: Inpatient  Remains inpatient appropriate because:Inpatient level of care appropriate due to severity of illness and severe hypoxemia.   Dispo: The patient is from: Home              Anticipated d/c is to: TBD; guarded prognosis  Anticipated d/c date is: > 3 days              Patient currently is not medically stable to d/c.  Consultants:   Pulmonology  6/25  Palliative care 6/27  Procedures:   Echocardiogram 06/17/2020:  1. Left ventricular ejection fraction, by estimation, is 70 to 75%. The  left ventricle has hyperdynamic function. The left ventricle has no  regional wall motion abnormalities. Left ventricular diastolic parameters  are consistent with Grade I diastolic  dysfunction (impaired relaxation).  2. Right ventricular systolic function is normal. The right ventricular  size is normal.  3. The mitral valve is normal in structure. No evidence of mitral valve  regurgitation. No evidence of mitral stenosis.  4. The aortic valve is normal in structure. Aortic valve regurgitation is  not visualized. No aortic stenosis is present.  5. The inferior vena cava is normal in size with greater than 50%  respiratory variability, suggesting right atrial pressure of 3 mmHg.   Antimicrobials:  Vancomycin, cefepime x1  Subjective: Again feels about the same today. Very short of breath with exertion. No chest pain, no leg swelling.   Objective: Vitals:   06/19/20 0500 06/19/20 0600 06/19/20 0829 06/19/20 0900  BP:      Pulse: 70 67    Resp: (!) 25 (!) 25    Temp:   97.9 F (36.6 C)   TempSrc:   Oral   SpO2: (!) 89% 92%  (!) 80%  Weight:      Height:        Intake/Output Summary (Last 24 hours) at 06/19/2020 1413 Last data filed at 06/19/2020 0600 Gross per 24 hour  Intake 220 ml  Output 900 ml  Net -680 ml   Filed Weights   05/22/2020 1807  Weight: 99.8 kg   Gen: 79 y.o. female in no distress acutely Pulm: Not labored at rest. With minimal exertion, SpO2 goes from low 80's% to 60's%. Crackles bilaterally stable. CV: Regular rate and rhythm. No murmur, rub, or gallop. No JVD, no dependent edema. GI: Abdomen soft, non-tender, non-distended, with normoactive bowel sounds.  Ext: Warm, no deformities Skin: No new rashes, lesions or ulcers on visualized skin. Neuro: Alert and oriented. No focal neurological  deficits. Psych: Judgement and insight appear fair. Mood euthymic & affect congruent. Behavior is appropriate.    Data Reviewed: I have personally reviewed following labs and imaging studies  CBC: Recent Labs  Lab 06/14/20 0454 06/15/20 0156 06/16/20 0249 06/17/20 0340 06/18/20 0710  WBC 5.9 4.7 5.8 4.6 6.6  NEUTROABS 5.4 4.2 5.0 4.2 5.9  HGB 9.4* 9.5* 11.2* 10.4* 10.9*  HCT 27.3* 28.3* 32.5* 30.7* 31.9*  MCV 85.0 86.0 83.8 85.5 85.1  PLT 126* 138* 144* 177 419   Basic Metabolic Panel: Recent Labs  Lab 06/13/20 0519 06/14/20 0454 06/16/20 0249 06/17/20 0340 06/18/20 0710  NA 136 134* 129* 131* 134*  K 5.1 4.7 4.1 3.8 4.0  CL 99 101 89* 89* 90*  CO2 26 24 27 31 31   GLUCOSE 111* 108* 129* 167* 98  BUN 12 13 19 21 23   CREATININE 0.64 0.57 0.67 0.58 0.65  CALCIUM 8.6* 8.2* 8.1* 8.4* 8.5*  MG  --  1.7 1.7 2.1  --   PHOS  --   --   --  3.5 3.2   GFR: Estimated Creatinine Clearance: 66.5 mL/min (by C-G formula based on SCr of 0.65 mg/dL). Liver Function Tests: Recent Labs  Lab 06/14/20 0454  AST  17  ALT 16  ALKPHOS 39  BILITOT 1.1  PROT 5.2*  ALBUMIN 2.5*   No results for input(s): LIPASE, AMYLASE in the last 168 hours. No results for input(s): AMMONIA in the last 168 hours. Coagulation Profile: No results for input(s): INR, PROTIME in the last 168 hours. Cardiac Enzymes: No results for input(s): CKTOTAL, CKMB, CKMBINDEX, TROPONINI in the last 168 hours. BNP (last 3 results) No results for input(s): PROBNP in the last 8760 hours. HbA1C: No results for input(s): HGBA1C in the last 72 hours. CBG: Recent Labs  Lab 06/18/20 2007 06/18/20 2339 06/19/20 0407 06/19/20 0804 06/19/20 1216  GLUCAP 137* 143* 128* 119* 210*   Lipid Profile: No results for input(s): CHOL, HDL, LDLCALC, TRIG, CHOLHDL, LDLDIRECT in the last 72 hours. Thyroid Function Tests: No results for input(s): TSH, T4TOTAL, FREET4, T3FREE, THYROIDAB in the last 72 hours. Anemia Panel: No  results for input(s): VITAMINB12, FOLATE, FERRITIN, TIBC, IRON, RETICCTPCT in the last 72 hours. Urine analysis:    Component Value Date/Time   COLORURINE YELLOW 05/25/2020 1812   APPEARANCEUR CLEAR 06/17/2020 1812   LABSPEC 1.015 06/19/2020 1812   PHURINE 6.0 06/19/2020 1812   GLUCOSEU NEGATIVE 05/26/2020 1812   HGBUR NEGATIVE 06/18/2020 1812   BILIRUBINUR NEGATIVE 05/28/2020 1812   KETONESUR NEGATIVE 05/30/2020 1812   PROTEINUR NEGATIVE 06/07/2020 1812   NITRITE NEGATIVE 05/24/2020 1812   LEUKOCYTESUR NEGATIVE 06/08/2020 1812   Recent Results (from the past 240 hour(s))  Urine culture     Status: None   Collection Time: 06/16/2020  6:12 PM   Specimen: In/Out Cath Urine  Result Value Ref Range Status   Specimen Description   Final    IN/OUT CATH URINE Performed at [facility], [place] [address] [place], [place] [address]    Special Requests   Final    NONE Performed at [facility], [place] [address] [place], [place] [address]    Culture   Final    NO GROWTH Performed at [facility], [place] [address] [place], [place] [address]    Report Status 06/11/2020 FINAL  Final  Blood Culture (routine x 2)     Status: None   Collection Time: 06/13/2020  6:34 PM   Specimen: BLOOD  Result Value Ref Range Status   Specimen Description   Final    BLOOD LEFT ANTECUBITAL Performed at [place] [address] [place], [place] [address]    Special Requests   Final    BOTTLES DRAWN AEROBIC AND ANAEROBIC Blood Culture adequate volume Performed at [place] [address], [place], [place] [address]    Culture   Final    NO GROWTH 5 DAYS Performed at [facility], [place] [address] [place], [place] [address]    Report Status 06/14/2020 FINAL  Final  Blood Culture (routine x 2)     Status: None   Collection Time: 06/08/2020  6:36 PM   Specimen: BLOOD LEFT HAND  Result Value Ref Range Status    Specimen Description   Final    BLOOD LEFT HAND Performed at [facility] [address] [place], [place] [address]    Special Requests   Final    BOTTLES DRAWN AEROBIC AND ANAEROBIC Blood Culture adequate volume Performed at [place] [address] [place], [place] [address]    Culture   Final    NO GROWTH 5 DAYS Performed at [facility], [place] [address] [place], [place] [address]    Report Status 06/14/2020 FINAL  Final  MRSA PCR Screening     Status: None   Collection Time: 06/14/20 11:21 PM   Specimen: Nasopharyngeal  Result Value Ref Range Status   MRSA by PCR NEGATIVE NEGATIVE Final    Comment:        The GeneXpert MRSA Assay (FDA approved for NASAL specimens only), is one component of a comprehensive MRSA colonization surveillance program. It is not intended to diagnose MRSA infection nor to guide or monitor treatment for MRSA infections. Performed at [facility], [place] [address], [place], [place] [address]   Respiratory Panel by PCR     Status: None   Collection Time: 06/15/20  5:45 PM   Specimen: Nasopharyngeal Swab; Respiratory  Result Value Ref Range Status   Adenovirus NOT DETECTED NOT DETECTED Final   Coronavirus 229E NOT DETECTED NOT DETECTED Final    Comment: (NOTE) The Coronavirus on the Respiratory Panel, DOES NOT test for the novel  Coronavirus (2019 nCoV)    Coronavirus HKU1 NOT DETECTED NOT DETECTED Final   Coronavirus NL63 NOT DETECTED NOT DETECTED Final   Coronavirus OC43 NOT DETECTED NOT DETECTED Final   Metapneumovirus NOT DETECTED NOT DETECTED Final   Rhinovirus / Enterovirus NOT DETECTED NOT DETECTED Final   Influenza A NOT DETECTED NOT DETECTED Final   Influenza B NOT DETECTED NOT DETECTED Final   Parainfluenza Virus 1 NOT DETECTED NOT DETECTED Final   Parainfluenza Virus 2 NOT DETECTED NOT DETECTED Final   Parainfluenza Virus 3 NOT DETECTED NOT DETECTED Final    Parainfluenza Virus 4 NOT DETECTED NOT DETECTED Final   Respiratory Syncytial Virus NOT DETECTED NOT DETECTED Final   Bordetella pertussis NOT DETECTED NOT DETECTED Final   Chlamydophila pneumoniae NOT DETECTED NOT DETECTED Final   Mycoplasma pneumoniae NOT DETECTED NOT DETECTED Final    Comment: Performed at [facility], [place]. [address], [place], [place] [address]      Radiology Studies: DG CHEST PORT 1 VIEW  Result Date: 06/18/2020 CLINICAL DATA:  COVID-19 infection EXAM: PORTABLE CHEST 1 VIEW COMPARISON:  Yesterday FINDINGS: Patchy bilateral pulmonary infiltrate correlating with history. Stable heart size. No visible effusion or air leak. IMPRESSION: Stable bilateral pneumonia. Electronically Signed   By: Monte Fantasia M.D.   On: 06/18/2020 06:32   ECHOCARDIOGRAM COMPLETE  Result Date: 06/17/2020    ECHOCARDIOGRAM REPORT   Patient Name:   Bryleigh REBA Lewman Date of Exam: 06/17/2020 Medical Rec #:  [id number]      Height:       64.0 in Accession #:    [id number]     Weight:       220.0 lb Date of Birth:  [birth year]       BSA:          2.037 m Patient Age:    67 years       BP:           159/73 mmHg Patient Gender: F              HR:           89 bpm. Exam Location:  Inpatient Procedure: 2D Echo, Cardiac Doppler and Color Doppler Indications:    Acute Respiratory Insufficiency 518.82 / R06.89  History:        Patient has prior history of Echocardiogram examinations, most  recent 01/13/2018. Signs/Symptoms:Murmur; Risk                 Factors:Hypertension and Dyslipidemia. Thyroid Disease. GERD.  Sonographer:    Jonelle Sidle Dance Referring Phys: [place]  1. Left ventricular ejection fraction, by estimation, is 70 to 75%. The left ventricle has hyperdynamic function. The left ventricle has no regional wall motion abnormalities. Left ventricular diastolic parameters are consistent with Grade I diastolic dysfunction (impaired relaxation).  2. Right ventricular  systolic function is normal. The right ventricular size is normal.  3. The mitral valve is normal in structure. No evidence of mitral valve regurgitation. No evidence of mitral stenosis.  4. The aortic valve is normal in structure. Aortic valve regurgitation is not visualized. No aortic stenosis is present.  5. The inferior vena cava is normal in size with greater than 50% respiratory variability, suggesting right atrial pressure of 3 mmHg. FINDINGS  Left Ventricle: Left ventricular ejection fraction, by estimation, is 70 to 75%. The left ventricle has hyperdynamic function. The left ventricle has no regional wall motion abnormalities. The left ventricular internal cavity size was normal in size. There is no left ventricular hypertrophy. Left ventricular diastolic parameters are consistent with Grade I diastolic dysfunction (impaired relaxation). Normal left ventricular filling pressure. Right Ventricle: The right ventricular size is normal. No increase in right ventricular wall thickness. Right ventricular systolic function is normal. Left Atrium: Left atrial size was normal in size. Right Atrium: Right atrial size was normal in size. Pericardium: There is no evidence of pericardial effusion. Mitral Valve: The mitral valve is normal in structure. Normal mobility of the mitral valve leaflets. No evidence of mitral valve regurgitation. No evidence of mitral valve stenosis. Tricuspid Valve: The tricuspid valve is normal in structure. Tricuspid valve regurgitation is not demonstrated. No evidence of tricuspid stenosis. Aortic Valve: The aortic valve is normal in structure. Aortic valve regurgitation is not visualized. No aortic stenosis is present. Pulmonic Valve: The pulmonic valve was normal in structure. Pulmonic valve regurgitation is not visualized. No evidence of pulmonic stenosis. Aorta: The aortic root is normal in size and structure. Venous: IVC assessment for right atrial pressure unable to be performed due  to mechanical ventilation. The inferior vena cava is normal in size with greater than 50% respiratory variability, suggesting right atrial pressure of 3 mmHg. IAS/Shunts: No atrial level shunt detected by color flow Doppler.  LEFT VENTRICLE PLAX 2D LVIDd:         4.00 cm  Diastology LVIDs:         2.90 cm  LV e' lateral:   6.53 cm/s LV PW:         1.00 cm  LV E/e' lateral: 8.7 LV IVS:        1.10 cm  LV e' medial:    4.79 cm/s LVOT diam:     2.00 cm  LV E/e' medial:  11.9 LV SV:         77 LV SV Index:   38 LVOT Area:     3.14 cm  RIGHT VENTRICLE             IVC RV Basal diam:  2.30 cm     IVC diam: 2.10 cm RV S prime:     15.20 cm/s TAPSE (M-mode): 1.5 cm LEFT ATRIUM             Index       RIGHT ATRIUM  Index LA diam:        3.50 cm 1.72 cm/m  RA Area:     10.10 cm LA Vol (A2C):   28.6 ml 14.04 ml/m RA Volume:   16.80 ml  8.25 ml/m LA Vol (A4C):   31.5 ml 15.46 ml/m LA Biplane Vol: 31.2 ml 15.31 ml/m  AORTIC VALVE LVOT Vmax:   116.00 cm/s LVOT Vmean:  83.800 cm/s LVOT VTI:    0.246 m  AORTA Ao Root diam: 2.90 cm Ao Asc diam:  3.40 cm MITRAL VALVE MV Area (PHT): 2.56 cm    SHUNTS MV Decel Time: 296 msec    Systemic VTI:  0.25 m MV E velocity: 56.90 cm/s  Systemic Diam: 2.00 cm MV A velocity: 69.20 cm/s MV E/A ratio:  0.82 Mihai Croitoru MD Electronically signed by Sanda Klein MD Signature Date/Time: 06/17/2020/4:51:53 PM    Final     Scheduled Meds: . ALPRAZolam  0.25 mg Oral Once  . benzonatate  100 mg Oral BID  . Chlorhexidine Gluconate Cloth  6 each Topical Daily  . cholecalciferol  2,000 Units Oral Daily  . enoxaparin (LOVENOX) injection  40 mg Subcutaneous QHS  . insulin aspart  3-9 Units Subcutaneous Q4H  . levothyroxine  75 mcg Oral Q0600  . mouth rinse  15 mL Mouth Rinse BID  . methylPREDNISolone (SOLU-MEDROL) injection  125 mg Intravenous Q6H   Continuous Infusions:    LOS: 10 days   Time spent: 35 minutes.  Patrecia Pour, MD [facility] [email or web address] 06/19/2020, 2:13 PM

## 2020-06-19 NOTE — Progress Notes (Signed)
Patient ID: Kristina Ramos, female   DOB: [birth year], 80 y.o.   MRN: [id number]   This NP visited patient at the bedside as a follow up for palliative medicine needs and emotional support.  Patient is alert and oriented.  Patient is out of bed to the chair.   She continues with weakness and shortness of breath requiring high flow oxygen.  Encouraged AROM exercise as demonstrated yesterday, efforts at nutritional intake and hydration.   Plan of care -DNR/DNI -Continue with all offered and available medical interventions to prolong life.  Patient and her family are hopeful for improvement and ultimately ability for patient to return to her home.  Continue with full medical support    Discussed with patient the importance of continued conversation with her  family and their  medical providers regarding overall plan of care and treatment options,  ensuring decisions are within the context of the patients values and GOCs.  Questions and concerns addressed     Total time spent on the unit was 20 minutes.   Palliative medicine will continue to support holistically  Greater than 50% of the time was spent in counseling and coordination of care  Wadie Lessen NP  Palliative Medicine Team Team Phone # [phone] Pager [phone]

## 2020-06-19 NOTE — Progress Notes (Signed)
NAME:  Kristina Ramos, MRN:  [id number], DOB:  [birth year], LOS: 10 ADMISSION DATE:  06/08/2020, CONSULTATION DATE:  6/25 REFERRING MD:  Bonner Puna, CHIEF COMPLAINT:  dyspnea  Brief History   79 yo female admitted initially on 6/7 with COVID 19 pneumonia, discharged 6/11 and returned on 6/19 with worsening dyspnea.  PCCM consulted for worsening hypoxemic respiratory failure on 6/27.   Past Medical History  Anemia, arthritis, depression, follicular lymphoma, GERD, Heart murmur, adenomatous colonic polyps, hyperlipidemia, hypertension  Significant Hospital Events   01/26/2020>> Pfizer SARS COV-2 vaccine #1 02/20/2020 >>Pfizer SARS COV-2 vaccine #2 04/03/2020>> 6th cycle of maintenance Gazyva  xxxxxxxxxxx 6/7-6/11 Admission for Covid Pneumonia 6/7-6/11 Remdesivir treatment 6/11 Discharged home on decadron 06/06/2020 >> Re-admission for worsening dyspnea, fever, and increasing oxygen requirements 6/7-6/16 Decadron treatment ,  was restarted 6/19 06/15/2020>> Transfer to SDU for increasing oxygen demands, CXR with more pronounced airspace opacities / worsening atelectasis  6/25 - Currently on 30 L HHFNC ( 60%) Sats are 96%, RR is 18 Pt. Was speaking in full sentences  on phone and in no distress when I examined her. She desaturates with minimal exertion, and takes about 10 minutes to rebound to sats > 92% She " feels" when she is hypoxic.  WBC is 4.7 Procalcitonin is < 0.10 Na is 134 ( Up from 126 on admission )  CRP has up trended within the last 24 hours from 3.6 to 6.9 D-dimer down trended to  2.25 ( was 2.55 on  6/24) Oxygen demands have increased to 30 L HHFNC ( 60%) last 24 hours CXR 6/25 shows increase in airspace opacities , more pronounced and worsening atelectasis Net + 1255 since admission Good response to Lasix 40 today, with 2600 cc's out this am  6/26 - still on HHFNC and face mask. Afebrile. Last echo 2019. On dvt proph. Sitting and able to got to toilet but that leaves her  tired. Othewrise WOB ok  6/27 refused BIPAP, solumedrol dose adjusted to 125mg  q6h  Consults:  6/25 PCCM  Procedures:    Significant Diagnostic Tests:  6/19 CT head > NAICP 6/27 Echo > LVEF 70-75%, RV size and function is normal  Micro Data:  6/7 SARS COV2 > positive 6/19 blood > neg 6/19 urine >  6/24 RVP > neg 6/25 COVID IgG > negative 6/26 urine strep > negative  Antimicrobials:  6/19>> Vanc 2 grams x 1 dose 6/19-6/20>> Cefepime  Interim history/subjective:   Feels about the same Off precautions Daughter and son visiting with her today O2 saturation dropping into 60's-70's with any movement Remains on heated high flow  Objective   Blood pressure (!) 145/89, pulse 67, temperature 98 F (36.7 C), temperature source Oral, resp. rate (!) 25, height 5\' 4"  (1.626 m), weight 99.8 kg, SpO2 92 %.    FiO2 (%):  [70 %] 70 %   Intake/Output Summary (Last 24 hours) at 06/19/2020 0725 Last data filed at 06/19/2020 0600 Gross per 24 hour  Intake 720 ml  Output 1600 ml  Net -880 ml   Filed Weights   06/05/2020 1807  Weight: 99.8 kg    Examination:  General:  Resting comfortably in bed HENT: NCAT OP clear PULM: Crackles bases B, normal effort CV: RRR, no mgr GI: BS+, soft, nontender MSK: normal bulk and tone Neuro: awake, alert, no distress, MAEW  6/28 CXR diffuse bilateral airspace disease left greater than right, personally reviewed  Resolved Hospital Problem list     Assessment & Plan:  ARDS due to COVID 19 pneumonia: immunocompromised, prolonged course Given advanced age and immunocompromised state it is exceedingly unlikely she would survive mechanical ventilation Symptoms improved on 6/28 after increasing solumedrol Continue solumedrol today, start decreasing dose on 6/30 Repeat chest imaging prn Discontinue airborne precautions Continue heated high flow Could likely make lasix prn at this point Tolerate periods of hypoxemia, goal at rest is greater  than 85% SaO2, with movement ideally above 75% Out of bed to chair as able Incentive spirometry is important, use every hour Push nutrition  Follicular lymphoma Hold immunosuppressive cancer treatment  CODE STATUS: Lengthy multidisciplinary conversation again today with palliative medicine, Dr. Bonner Puna and me with the patient's daughter, son and bedside nurse.  Code status DNR but continuing full medical support.  Best practice:   Per [facility]  Labs   CBC: Recent Labs  Lab 06/14/20 0454 06/15/20 0156 06/16/20 0249 06/17/20 0340 06/18/20 0710  WBC 5.9 4.7 5.8 4.6 6.6  NEUTROABS 5.4 4.2 5.0 4.2 5.9  HGB 9.4* 9.5* 11.2* 10.4* 10.9*  HCT 27.3* 28.3* 32.5* 30.7* 31.9*  MCV 85.0 86.0 83.8 85.5 85.1  PLT 126* 138* 144* 177 158    Basic Metabolic Panel: Recent Labs  Lab 06/13/20 0519 06/14/20 0454 06/16/20 0249 06/17/20 0340 06/18/20 0710  NA 136 134* 129* 131* 134*  K 5.1 4.7 4.1 3.8 4.0  CL 99 101 89* 89* 90*  CO2 26 24 27 31 31   GLUCOSE 111* 108* 129* 167* 98  BUN 12 13 19 21 23   CREATININE 0.64 0.57 0.67 0.58 0.65  CALCIUM 8.6* 8.2* 8.1* 8.4* 8.5*  MG  --  1.7 1.7 2.1  --   PHOS  --   --   --  3.5 3.2   GFR: Estimated Creatinine Clearance: 66.5 mL/min (by C-G formula based on SCr of 0.65 mg/dL). Recent Labs  Lab 06/15/20 0156 06/16/20 0249 06/17/20 0340 06/18/20 0710  PROCALCITON <0.10  --   --   --   WBC 4.7 5.8 4.6 6.6    Liver Function Tests: Recent Labs  Lab 06/14/20 0454  AST 17  ALT 16  ALKPHOS 39  BILITOT 1.1  PROT 5.2*  ALBUMIN 2.5*   No results for input(s): LIPASE, AMYLASE in the last 168 hours. No results for input(s): AMMONIA in the last 168 hours.  ABG    Component Value Date/Time   HCO3 27.1 05/23/2020 1835   O2SAT 93.3 06/18/2020 1835     Coagulation Profile: No results for input(s): INR, PROTIME in the last 168 hours.  Cardiac Enzymes: No results for input(s): CKTOTAL, CKMB, CKMBINDEX, TROPONINI in the last 168  hours.  HbA1C: No results found for: HGBA1C  CBG: Recent Labs  Lab 06/18/20 1145 06/18/20 1736 06/18/20 2007 06/18/20 2339 06/19/20 0407  GLUCAP 250* 135* 137* 143* 128*     Critical care time: 35 minutes     Roselie Awkward, MD [place] Pager: [phone] Cell: [phone] If no response, call [phone]

## 2020-06-19 NOTE — Plan of Care (Signed)
Per pt, ok to give update via phone to daughter, Katy Apo. Daughter updated with plan of care.

## 2020-06-20 DIAGNOSIS — Z7189 Other specified counseling: Secondary | ICD-10-CM

## 2020-06-20 LAB — GLUCOSE, CAPILLARY
Glucose-Capillary: 122 mg/dL — ABNORMAL HIGH (ref 70–99)
Glucose-Capillary: 84 mg/dL (ref 70–99)
Glucose-Capillary: 207 mg/dL — ABNORMAL HIGH (ref 70–99)
Glucose-Capillary: 132 mg/dL — ABNORMAL HIGH (ref 70–99)
Glucose-Capillary: 184 mg/dL — ABNORMAL HIGH (ref 70–99)

## 2020-06-20 MED ORDER — FUROSEMIDE 10 MG/ML IJ SOLN
40.0000 mg | Freq: Four times a day (QID) | INTRAMUSCULAR | Status: AC
  Administered 2020-06-20 (×2): 40 mg via INTRAVENOUS
  Filled 2020-06-20 (×2): qty 4

## 2020-06-20 MED ORDER — METHYLPREDNISOLONE SODIUM SUCC 125 MG IJ SOLR
125.0000 mg | Freq: Two times a day (BID) | INTRAMUSCULAR | Status: DC
  Administered 2020-06-20 – 2020-06-24 (×8): 125 mg via INTRAVENOUS
  Filled 2020-06-20 (×8): qty 2

## 2020-06-20 NOTE — Progress Notes (Signed)
PT Cancellation Note  Patient Details Name: Kristina Ramos MRN: [id number] DOB: 1941-12-04   Cancelled Treatment:    Reason Eval/Treat Not Completed: Medical issues which prohibited therapypatient in recliner, Per RN, wants to stay til dinner. Desats and needs NRB and HHFNC . Will chack back another time.   Claretha Cooper 06/20/2020, 3:24 PM  [place] Pager [phone] Office [phone]

## 2020-06-20 NOTE — Progress Notes (Signed)
NAME:  Kristina Ramos, MRN:  [id number], DOB:  [birth year], LOS: 11 ADMISSION DATE:  06/04/2020, CONSULTATION DATE:  6/25 REFERRING MD:  Bonner Puna, CHIEF COMPLAINT:  dyspnea  Brief History   79 yo female admitted initially on 6/7 with COVID 19 pneumonia, discharged 6/11 and returned on 6/19 with worsening dyspnea.  PCCM consulted for worsening hypoxemic respiratory failure on 6/27.   Past Medical History  Anemia, arthritis, depression, follicular lymphoma, GERD, Heart murmur, adenomatous colonic polyps, hyperlipidemia, hypertension  Significant Hospital Events   01/26/2020>> Pfizer SARS COV-2 vaccine #1 02/20/2020 >>Pfizer SARS COV-2 vaccine #2 04/03/2020>> 6th cycle of maintenance Gazyva  xxxxxxxxxxx 6/7-6/11 Admission for Covid Pneumonia 6/7-6/11 Remdesivir treatment 6/11 Discharged home on decadron 06/06/2020 >> Re-admission for worsening dyspnea, fever, and increasing oxygen requirements 6/7-6/16 Decadron treatment ,  was restarted 6/19 06/15/2020>> Transfer to SDU for increasing oxygen demands, CXR with more pronounced airspace opacities / worsening atelectasis  6/25 - Currently on 30 L HHFNC ( 60%) Sats are 96%, RR is 18 Pt. Was speaking in full sentences  on phone and in no distress when I examined her. She desaturates with minimal exertion, and takes about 10 minutes to rebound to sats > 92% She " feels" when she is hypoxic.  WBC is 4.7 Procalcitonin is < 0.10 Na is 134 ( Up from 126 on admission )  CRP has up trended within the last 24 hours from 3.6 to 6.9 D-dimer down trended to  2.25 ( was 2.55 on  6/24) Oxygen demands have increased to 30 L HHFNC ( 60%) last 24 hours CXR 6/25 shows increase in airspace opacities , more pronounced and worsening atelectasis Net + 1255 since admission Good response to Lasix 40 today, with 2600 cc's out this am  6/26 - still on HHFNC and face mask. Afebrile. Last echo 2019. On dvt proph. Sitting and able to got to toilet but that leaves her  tired. Othewrise WOB ok  6/27 refused BIPAP, solumedrol dose adjusted to 125mg  q6h  6/29 Palliative care/TRH/PCCM multi-disciplinary conference with patient and family: full support, DNR if arrests, no intubation  Consults:  6/25 PCCM  Procedures:    Significant Diagnostic Tests:  6/19 CT head > NAICP 6/27 Echo > LVEF 70-75%, RV size and function is normal  Micro Data:  6/7 SARS COV2 > positive 6/19 blood > neg 6/19 urine >  6/24 RVP > neg 6/25 COVID IgG > negative 6/26 urine strep > negative  Antimicrobials:  6/19>> Vanc 2 grams x 1 dose 6/19-6/20>> Cefepime  Interim history/subjective:   Says she feels more short of breath Urine NRB mask a little more to help with dyspnea Oxygenation about the same  Objective   Blood pressure 131/72, pulse 67, temperature (!) 97.3 F (36.3 C), temperature source Axillary, resp. rate (!) 29, height 5\' 4"  (1.626 m), weight 99.8 kg, SpO2 (!) 80 %.    FiO2 (%):  [70 %-80 %] 80 %   Intake/Output Summary (Last 24 hours) at 06/20/2020 0948 Last data filed at 06/20/2020 0330 Gross per 24 hour  Intake --  Output 800 ml  Net -800 ml   Filed Weights   05/30/2020 1807  Weight: 99.8 kg    Examination:  General:  Resting comfortably in bed HENT: NCAT OP clear PULM: Crackles bases B, normal effort CV: RRR, no mgr GI: BS+, soft, nontender MSK: normal bulk and tone Neuro: awake, alert, no distress, MAEW    Resolved Hospital Problem list     Assessment &  Plan:  ARDS due to COVID 19 pneumonia: immunocompromised, prolonged course Given advanced age and immunocompromised state it is exceedingly unlikely she would survive mechanical ventilation Symptoms improved on 6/28 after increasing solumedrol 6/30 > worsening dyspnea, crackles in bases, likely acute pulmonary edema Reduce solumedrol to 125mg  q12h, would plan to decrease again in 3-4 days Diurese again today: restart lasix Repeat CXR in AM Continue heated high flow O2 Push  nutrition, getting out of bed Tolerate periods of hypoxemia, goal at rest is greater than 85% SaO2, with movement ideally above 75% Out of bed to chair as able Incentive spirometry is important, use every hour  Follicular lymphoma Hold immunosuppressive cancer treatment  CODE STATUS: Lengthy multidisciplinary conversation 6/30 with palliative medicine, Dr. Bonner Puna and me with the patient's daughter, son and bedside nurse.  Code status DNR but continuing full medical support.  Best practice:   Per [facility]  Labs   CBC: Recent Labs  Lab 06/14/20 0454 06/15/20 0156 06/16/20 0249 06/17/20 0340 06/18/20 0710  WBC 5.9 4.7 5.8 4.6 6.6  NEUTROABS 5.4 4.2 5.0 4.2 5.9  HGB 9.4* 9.5* 11.2* 10.4* 10.9*  HCT 27.3* 28.3* 32.5* 30.7* 31.9*  MCV 85.0 86.0 83.8 85.5 85.1  PLT 126* 138* 144* 177 659    Basic Metabolic Panel: Recent Labs  Lab 06/14/20 0454 06/16/20 0249 06/17/20 0340 06/18/20 0710  NA 134* 129* 131* 134*  K 4.7 4.1 3.8 4.0  CL 101 89* 89* 90*  CO2 24 27 31 31   GLUCOSE 108* 129* 167* 98  BUN 13 19 21 23   CREATININE 0.57 0.67 0.58 0.65  CALCIUM 8.2* 8.1* 8.4* 8.5*  MG 1.7 1.7 2.1  --   PHOS  --   --  3.5 3.2   GFR: Estimated Creatinine Clearance: 66.5 mL/min (by C-G formula based on SCr of 0.65 mg/dL). Recent Labs  Lab 06/15/20 0156 06/16/20 0249 06/17/20 0340 06/18/20 0710  PROCALCITON <0.10  --   --   --   WBC 4.7 5.8 4.6 6.6    Liver Function Tests: Recent Labs  Lab 06/14/20 0454  AST 17  ALT 16  ALKPHOS 39  BILITOT 1.1  PROT 5.2*  ALBUMIN 2.5*   No results for input(s): LIPASE, AMYLASE in the last 168 hours. No results for input(s): AMMONIA in the last 168 hours.  ABG    Component Value Date/Time   HCO3 27.1 06/01/2020 1835   O2SAT 93.3 06/20/2020 1835     Coagulation Profile: No results for input(s): INR, PROTIME in the last 168 hours.  Cardiac Enzymes: No results for input(s): CKTOTAL, CKMB, CKMBINDEX, TROPONINI in the last 168  hours.  HbA1C: No results found for: HGBA1C  CBG: Recent Labs  Lab 06/19/20 1537 06/19/20 2015 06/19/20 2324 06/20/20 0333 06/20/20 0805  GLUCAP 182* 200* 145* 122* 84     Critical care time: n/a     Roselie Awkward, MD [place] [facility] Pager: [phone] Cell: [phone] If no response, call [phone]

## 2020-06-20 NOTE — Progress Notes (Signed)
PROGRESS NOTE  Kristina Ramos  TDD:[id number] DOB: 1941-02-18 DOA: 06/12/2020 PCP: Ann Held, DO   Brief Narrative: Kristina Ramos is a 79 y.o. female with a history of follicular lymphoma, HTN, hypothyroidism, iron deficiency, and hospitalization 6/7-6/11 for covid-19 pneumonia discharged on decadron requiring 2L O2 having completed remdesivir. She completed decadron 6/16 and returned to ED 6/19 due to worsening dyspnea, lethargy, found to be febrile to 101.29F with 4L O2 requirement. CXR demonstrated stable bilateral infiltrates with no new focal consolidation. She was lethargic, CT head unrevealing, and hyponatremic (Na 126). She was admitted, started back on steroids with improved mentation, strength, and given lasix empirically due to persistent desaturations worse with exertion. Hypoxemia worsened and she was transferred to SDU on [facility] 6/24 with subsequent PCCM consultation 6/25. Hypoxemia has remained severe and progressive. Goals of care conversations have been ongoing with medical team and PCCM with assistance of palliative care consultant.  Assessment & Plan: Principal Problem:   Acute on chronic respiratory failure with hypoxia (HCC) Active Problems:   Hypothyroidism   Essential hypertension, benign   DNR (do not resuscitate) discussion   Follicular lymphoma grade ii, lymph nodes of multiple sites ([place])   COVID-19 virus infection   Hyponatremia   Palliative care by specialist   Acute respiratory distress  Acute on chronic hypoxic respiratory due to ARDS due to covid-19 pneumonia: Off isolation as of 6/29. Immunized prior to contracting covid (2/4 and 3/1 Pfizer) though IgG Ab's negative x2 here. CXR with persistent bilateral infiltrates consistent with ARDS. - s/p remesivir 6/7 - 6/11 - s/p decadron 6/7 - 6/16, restarted 6/19 >> augmented 6/26 and again 6/27. CRP has normalized. Taper steroids per PCCM starting 6/30 to 125mg  IV q12h with plans to taper again in 3-4  days. - Continue supplemental oxygen with permissive hypoxemia for mobility. Patient would not benefit from escalation of care (i.e. mechanical ventilation) so this is not in plan of care.    Acute on chronic HFpEF: Mild exacerbation was treated empirically. LVEF is hyperdynamic on echo with G1DD and normal RV and IVC.  - Agree with reinitiation of lasix. Will monitor I/O, daily weights and exam.   Relative adrenal insufficiency: Significant global symptomatic improvement with reinitiation of steroids.  - Steroids as above, will need slow taper.   Pancytopenia: Lymphopenia on admission with normocytic anemia and thrombocytopenia. Likely related to viral infection with hemodilutional process with IVF. Splenomegaly related to lymphoma likely contributing. - Monitoring. Much improved.  Hyponatremia: Worsened with diuresis. Improving.  Hypokalemia: Supplemented.  - Will continue monitoring 6/30.  Follicular lymphoma: follows with Dr. 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  Obesity: Estimated body mass index is 37.76 kg/m as calculated from the following:   Height as of this encounter: 5\' 4"  (1.626 m).   Weight as of this encounter: 99.8 kg.  DVT prophylaxis: Lovenox Code Status: DNR. Interdisciplinary family meeting held 06/19/2020. Family Communication: None at bedside this AM. Disposition Plan: Remain in SDU on heated high flow oxygen.  Status is: Inpatient  Remains inpatient appropriate because:Inpatient level of care appropriate due to severity of illness and severe hypoxemia.   Dispo: The patient is from: Home              Anticipated d/c is  to: TBD; guarded prognosis              Anticipated d/c date is: > 3 days              Patient currently is not  medically stable to d/c.  Consultants:   Pulmonology 6/25  Palliative care 6/27  Procedures:   Echocardiogram 06/17/2020:  1. Left ventricular ejection fraction, by estimation, is 70 to 75%. The  left ventricle has hyperdynamic function. The left ventricle has no  regional wall motion abnormalities. Left ventricular diastolic parameters  are consistent with Grade I diastolic  dysfunction (impaired relaxation).  2. Right ventricular systolic function is normal. The right ventricular  size is normal.  3. The mitral valve is normal in structure. No evidence of mitral valve  regurgitation. No evidence of mitral stenosis.  4. The aortic valve is normal in structure. Aortic valve regurgitation is  not visualized. No aortic stenosis is present.  5. The inferior vena cava is normal in size with greater than 50%  respiratory variability, suggesting right atrial pressure of 3 mmHg.   Antimicrobials:  Vancomycin, cefepime x1  Subjective: Intermittent severe dyspnea is stable in intensity, improved with NRB and provoked by any movement. Able to eat/drink. No chest pain  Objective: Vitals:   06/20/20 0500 06/20/20 0600 06/20/20 0800 06/20/20 0816  BP:      Pulse: 65 67    Resp: 20 (!) 29    Temp:   (!) 97.3 F (36.3 C)   TempSrc:   Axillary   SpO2: 91% (!) 87%  (!) 80%  Weight:      Height:        Intake/Output Summary (Last 24 hours) at 06/20/2020 1204 Last data filed at 06/20/2020 1100 Gross per 24 hour  Intake --  Output 1150 ml  Net -1150 ml   Filed Weights   05/26/2020 1807  Weight: 99.8 kg   Gen: 79 y.o. female in no acute distress Pulm: Bilateral crackles more prominent today. No accessory muscle use. CV: Regular rate and rhythm. No murmur, rub, or gallop. No JVD, no dependent edema. GI: Abdomen soft, non-tender, non-distended, with normoactive bowel sounds.  Ext: Warm, no deformities Skin: No rashes, lesions or ulcers on visualized skin. Neuro: Alert and  oriented. No focal neurological deficits. Psych: Judgement and insight appear fair. Mood euthymic & affect congruent. Behavior is appropriate.    Data Reviewed: I have personally reviewed following labs and imaging studies  CBC: Recent Labs  Lab 06/14/20 0454 06/15/20 0156 06/16/20 0249 06/17/20 0340 06/18/20 0710  WBC 5.9 4.7 5.8 4.6 6.6  NEUTROABS 5.4 4.2 5.0 4.2 5.9  HGB 9.4* 9.5* 11.2* 10.4* 10.9*  HCT 27.3* 28.3* 32.5* 30.7* 31.9*  MCV 85.0 86.0 83.8 85.5 85.1  PLT 126* 138* 144* 177 428   Basic Metabolic Panel: Recent Labs  Lab 06/14/20 0454 06/16/20 0249 06/17/20 0340 06/18/20 0710  NA 134* 129* 131* 134*  K 4.7 4.1 3.8 4.0  CL 101 89* 89* 90*  CO2 24 27 31 31   GLUCOSE 108* 129* 167* 98  BUN 13 19 21 23   CREATININE 0.57 0.67 0.58 0.65  CALCIUM 8.2* 8.1* 8.4* 8.5*  MG 1.7 1.7 2.1  --   PHOS  --   --  3.5 3.2   GFR: Estimated Creatinine Clearance: 66.5 mL/min (by C-G formula based on SCr of 0.65 mg/dL). Liver Function Tests: Recent Labs  Lab 06/14/20 0454  AST 17  ALT 16  ALKPHOS 39  BILITOT 1.1  PROT 5.2*  ALBUMIN 2.5*   No results for input(s): LIPASE, AMYLASE in the last 168 hours. No results for input(s): AMMONIA in the last 168 hours. Coagulation Profile: No results for input(s): INR, PROTIME in the last 168 hours. Cardiac Enzymes: No results for input(s): CKTOTAL, CKMB, CKMBINDEX, TROPONINI in the last 168 hours. BNP (last 3 results) No results for input(s): PROBNP in the last 8760 hours. HbA1C: No results for input(s): HGBA1C in the last 72 hours. CBG: Recent Labs  Lab 06/19/20 2015 06/19/20 2324 06/20/20 0333 06/20/20 0805 06/20/20 1133  GLUCAP 200* 145* 122* 84 207*   Lipid Profile: No results for input(s): CHOL, HDL, LDLCALC, TRIG, CHOLHDL, LDLDIRECT in the last 72 hours. Thyroid Function Tests: No results for input(s): TSH, T4TOTAL, FREET4, T3FREE, THYROIDAB in the last 72 hours. Anemia Panel: No results for input(s):  VITAMINB12, FOLATE, FERRITIN, TIBC, IRON, RETICCTPCT in the last 72 hours. Urine analysis:    Component Value Date/Time   COLORURINE YELLOW 05/29/2020 1812   APPEARANCEUR CLEAR 06/15/2020 1812   LABSPEC 1.015 05/31/2020 1812   PHURINE 6.0 06/19/2020 1812   GLUCOSEU NEGATIVE 05/30/2020 1812   HGBUR NEGATIVE 06/01/2020 1812   BILIRUBINUR NEGATIVE 05/23/2020 1812   KETONESUR NEGATIVE 06/17/2020 1812   PROTEINUR NEGATIVE 06/11/2020 1812   NITRITE NEGATIVE 06/08/2020 1812   LEUKOCYTESUR NEGATIVE 06/05/2020 1812   Recent Results (from the past 240 hour(s))  MRSA PCR Screening     Status: None   Collection Time: 06/14/20 11:21 PM   Specimen: Nasopharyngeal  Result Value Ref Range Status   MRSA by PCR NEGATIVE NEGATIVE Final    Comment:        The GeneXpert MRSA Assay (FDA approved for NASAL specimens only), is one component of a comprehensive MRSA colonization surveillance program. It is not intended to diagnose MRSA infection nor to guide or monitor treatment for MRSA infections. Performed at [facility], [place] [address], [place], [place] [address]   Respiratory Panel by PCR     Status: None   Collection Time: 06/15/20  5:45 PM   Specimen: Nasopharyngeal Swab; Respiratory  Result Value Ref Range Status   Adenovirus NOT DETECTED NOT DETECTED Final   Coronavirus 229E NOT DETECTED NOT DETECTED Final    Comment: (NOTE) The Coronavirus on the Respiratory Panel, DOES NOT test for the novel  Coronavirus (2019 nCoV)    Coronavirus HKU1 NOT DETECTED NOT DETECTED Final   Coronavirus NL63 NOT DETECTED NOT DETECTED Final   Coronavirus OC43 NOT DETECTED NOT DETECTED Final   Metapneumovirus NOT DETECTED NOT DETECTED Final   Rhinovirus / Enterovirus NOT DETECTED NOT DETECTED Final   Influenza A NOT DETECTED NOT DETECTED Final   Influenza B NOT DETECTED NOT DETECTED Final   Parainfluenza Virus 1 NOT DETECTED NOT DETECTED Final   Parainfluenza Virus 2 NOT DETECTED NOT  DETECTED Final   Parainfluenza Virus 3 NOT DETECTED NOT DETECTED Final   Parainfluenza Virus 4 NOT DETECTED NOT DETECTED Final   Respiratory Syncytial Virus NOT DETECTED NOT DETECTED Final   Bordetella pertussis NOT DETECTED NOT DETECTED Final   Chlamydophila pneumoniae NOT DETECTED NOT DETECTED Final   Mycoplasma pneumoniae NOT DETECTED NOT DETECTED Final    Comment: Performed at [facility], [place]. [address] [place], [place] [address]      Radiology Studies: No results found.  Scheduled Meds: . ALPRAZolam  0.25 mg Oral Once  . benzonatate  100 mg Oral BID  . Chlorhexidine Gluconate  Cloth  6 each Topical Daily  . cholecalciferol  2,000 Units Oral Daily  . enoxaparin (LOVENOX) injection  40 mg Subcutaneous QHS  . furosemide  40 mg Intravenous Q6H  . insulin aspart  3-9 Units Subcutaneous Q4H  . levothyroxine  75 mcg Oral Q0600  . mouth rinse  15 mL Mouth Rinse BID  . methylPREDNISolone (SOLU-MEDROL) injection  125 mg Intravenous Q12H   Continuous Infusions:    LOS: 11 days   Time spent: 35 minutes.  Patrecia Pour, MD [facility] [email or web address] 06/20/2020, 12:04 PM

## 2020-06-21 ENCOUNTER — Inpatient Hospital Stay (HOSPITAL_COMMUNITY): Payer: Medicare Other

## 2020-06-21 DIAGNOSIS — R0609 Other forms of dyspnea: Secondary | ICD-10-CM

## 2020-06-21 LAB — BASIC METABOLIC PANEL
Anion gap: 15 (ref 5–15)
BUN: 30 mg/dL — ABNORMAL HIGH (ref 8–23)
CO2: 30 mmol/L (ref 22–32)
Calcium: 8.7 mg/dL — ABNORMAL LOW (ref 8.9–10.3)
Chloride: 87 mmol/L — ABNORMAL LOW (ref 98–111)
Creatinine, Ser: 0.96 mg/dL (ref 0.44–1.00)
GFR calc Af Amer: >60 mL/min (ref >60)
GFR calc non Af Amer: 57 mL/min — ABNORMAL LOW (ref >60)
Glucose, Bld: 133 mg/dL — ABNORMAL HIGH (ref 70–99)
Potassium: 3.1 mmol/L — ABNORMAL LOW (ref 3.5–5.1)
Sodium: 132 mmol/L — ABNORMAL LOW (ref 135–145)

## 2020-06-21 LAB — CBC WITH DIFFERENTIAL/PLATELET
Abs Immature Granulocytes: 1.38 10*3/uL — ABNORMAL HIGH (ref 0.00–0.07)
Basophils Absolute: 0.1 10*3/uL (ref 0.0–0.1)
Basophils Relative: 1 %
Eosinophils Absolute: 0 10*3/uL (ref 0.0–0.5)
Eosinophils Relative: 0 %
HCT: 38 % (ref 36.0–46.0)
Hemoglobin: 13.2 g/dL (ref 12.0–15.0)
Immature Granulocytes: 6 %
Lymphocytes Relative: 2 %
Lymphs Abs: 0.6 10*3/uL — ABNORMAL LOW (ref 0.7–4.0)
MCH: 28.9 pg (ref 26.0–34.0)
MCHC: 34.7 g/dL (ref 30.0–36.0)
MCV: 83.3 fL (ref 80.0–100.0)
Monocytes Absolute: 0.7 10*3/uL (ref 0.1–1.0)
Monocytes Relative: 3 %
Neutro Abs: 21.7 10*3/uL — ABNORMAL HIGH (ref 1.7–7.7)
Neutrophils Relative %: 88 %
Platelets: 412 10*3/uL — ABNORMAL HIGH (ref 150–400)
RBC: 4.56 MIL/uL (ref 3.87–5.11)
RDW: 13.6 % (ref 11.5–15.5)
WBC: 24.4 10*3/uL — ABNORMAL HIGH (ref 4.0–10.5)
nRBC: 0.1 % (ref 0.0–0.2)

## 2020-06-21 LAB — GLUCOSE, CAPILLARY
Glucose-Capillary: 107 mg/dL — ABNORMAL HIGH (ref 70–99)
Glucose-Capillary: 140 mg/dL — ABNORMAL HIGH (ref 70–99)
Glucose-Capillary: 143 mg/dL — ABNORMAL HIGH (ref 70–99)
Glucose-Capillary: 147 mg/dL — ABNORMAL HIGH (ref 70–99)
Glucose-Capillary: 148 mg/dL — ABNORMAL HIGH (ref 70–99)
Glucose-Capillary: 152 mg/dL — ABNORMAL HIGH (ref 70–99)
Glucose-Capillary: 219 mg/dL — ABNORMAL HIGH (ref 70–99)

## 2020-06-21 LAB — CBC
HCT: 35.5 % — ABNORMAL LOW (ref 36.0–46.0)
Hemoglobin: 12.7 g/dL (ref 12.0–15.0)
MCH: 29.4 pg (ref 26.0–34.0)
MCHC: 35.8 g/dL (ref 30.0–36.0)
MCV: 82.2 fL (ref 80.0–100.0)
Platelets: 332 10*3/uL (ref 150–400)
RBC: 4.32 MIL/uL (ref 3.87–5.11)
RDW: 13.4 % (ref 11.5–15.5)
WBC: 18.2 10*3/uL — ABNORMAL HIGH (ref 4.0–10.5)
nRBC: 0 % (ref 0.0–0.2)

## 2020-06-21 MED ORDER — POTASSIUM CHLORIDE 20 MEQ PO PACK
20.0000 meq | PACK | Freq: Two times a day (BID) | ORAL | Status: AC
  Administered 2020-06-21 – 2020-06-22 (×4): 20 meq via ORAL
  Filled 2020-06-21 (×4): qty 1

## 2020-06-21 MED ORDER — FUROSEMIDE 10 MG/ML IJ SOLN
40.0000 mg | Freq: Once | INTRAMUSCULAR | Status: AC
  Administered 2020-06-21: 40 mg via INTRAVENOUS
  Filled 2020-06-21: qty 4

## 2020-06-21 MED ORDER — ALPRAZOLAM 0.5 MG PO TABS
0.5000 mg | ORAL_TABLET | Freq: Two times a day (BID) | ORAL | Status: DC | PRN
  Administered 2020-06-21 – 2020-06-23 (×5): 0.5 mg via ORAL
  Filled 2020-06-21 (×5): qty 1

## 2020-06-21 NOTE — Progress Notes (Signed)
NAME:  Kristina Ramos, MRN:  [id number], DOB:  [birth year], LOS: 12 ADMISSION DATE:  06/01/2020, CONSULTATION DATE:  6/25 REFERRING MD:  Bonner Puna, CHIEF COMPLAINT:  dyspnea  Brief History   79 yo female admitted initially on 6/7 with COVID 19 pneumonia, discharged 6/11 and returned on 6/19 with worsening dyspnea.  PCCM consulted for worsening hypoxemic respiratory failure on 6/27.   Past Medical History  Anemia, arthritis, depression, follicular lymphoma, GERD, Heart murmur, adenomatous colonic polyps, hyperlipidemia, hypertension  Significant Hospital Events   01/26/2020>> Pfizer SARS COV-2 vaccine #1 02/20/2020 >>Pfizer SARS COV-2 vaccine #2 04/03/2020>> 6th cycle of maintenance Gazyva  xxxxxxxxxxx 6/7-6/11 Admission for Covid Pneumonia 6/7-6/11 Remdesivir treatment 6/11 Discharged home on decadron 06/06/2020 >> Re-admission for worsening dyspnea, fever, and increasing oxygen requirements 6/7-6/16 Decadron treatment ,  was restarted 6/19 06/15/2020>> Transfer to SDU for increasing oxygen demands, CXR with more pronounced airspace opacities / worsening atelectasis  6/25 - Currently on 30 L HHFNC ( 60%) Sats are 96%, RR is 18 Pt. Was speaking in full sentences  on phone and in no distress when I examined her. She desaturates with minimal exertion, and takes about 10 minutes to rebound to sats > 92% She " feels" when she is hypoxic.  WBC is 4.7 Procalcitonin is < 0.10 Na is 134 ( Up from 126 on admission )  CRP has up trended within the last 24 hours from 3.6 to 6.9 D-dimer down trended to  2.25 ( was 2.55 on  6/24) Oxygen demands have increased to 30 L HHFNC ( 60%) last 24 hours CXR 6/25 shows increase in airspace opacities , more pronounced and worsening atelectasis Net + 1255 since admission Good response to Lasix 40 today, with 2600 cc's out this am  6/26 - still on HHFNC and face mask. Afebrile. Last echo 2019. On dvt proph. Sitting and able to got to toilet but that leaves her  tired. Othewrise WOB ok  6/27 refused BIPAP, solumedrol dose adjusted to 125mg  q6h  6/29 Palliative care/TRH/PCCM multi-disciplinary conference with patient and family: full support, DNR if arrests, no intubation  7/1 on high flow oxygen, still short of breath  Consults:  6/25 PCCM  Procedures:    Significant Diagnostic Tests:  6/19 CT head > NAICP 6/27 Echo > LVEF 70-75%, RV size and function is normal  Micro Data:  6/7 SARS COV2 > positive 6/19 blood > neg 6/19 urine >  6/24 RVP > neg 6/25 COVID IgG > negative 6/26 urine strep > negative  Antimicrobials:  6/19>> Vanc 2 grams x 1 dose 6/19-6/20>> Cefepime  Interim history/subjective:   Says she feels more short of breath Diuresing well Denies any pain  Objective   Blood pressure (!) 136/52, pulse 90, temperature (!) 97.5 F (36.4 C), temperature source Axillary, resp. rate (!) 21, height 5\' 4"  (1.626 m), weight 99.8 kg, SpO2 (!) 86 %.    FiO2 (%):  [80 %-100 %] 100 %   Intake/Output Summary (Last 24 hours) at 06/21/2020 1115 Last data filed at 06/21/2020 0942 Gross per 24 hour  Intake 240 ml  Output 2100 ml  Net -1860 ml   Filed Weights   05/29/2020 1807  Weight: 99.8 kg    Examination:  General:  Resting comfortably in bed HENT: Moist oral mucosa PULM: Crackles bases B, normal effort CV: S1-S2 appreciated with no murmur GI: BS+, soft, nontender MSK: normal bulk and tone Neuro: awake, alert, no distress, MAEW    Resolved Hospital Problem list  Assessment & Plan:  ARDS due to COVID 19 pneumonia: immunocompromised, prolonged course Given advanced age and immunocompromised state it is exceedingly unlikely she would survive mechanical ventilation Symptoms improved on 6/28 after increasing solumedrol 6/30 > worsening dyspnea, crackles in bases, likely acute pulmonary edema Continue Solu-Medrol 125 every 12 She continues on diuresis Continue heated high flow Keep saturations greater than  85%  Continue all supportive measures at present  Hypokalemia -Continue to replete  Follicular lymphoma Hold immunosuppressive cancer treatment  CODE STATUS: Lengthy multidisciplinary conversation 6/30 with palliative medicine, Dr. Bonner Puna and Dr. Lake Bells with the patient's daughter, son and bedside nurse.  Code status DNR but continuing full medical support.  Best practice:   Per TRH  Labs   CBC: Recent Labs  Lab 06/15/20 0156 06/15/20 0156 06/16/20 0249 06/17/20 0340 06/18/20 0710 06/21/20 0144 06/21/20 0611  WBC 4.7   < > 5.8 4.6 6.6 24.4* 18.2*  NEUTROABS 4.2  --  5.0 4.2 5.9 21.7*  --   HGB 9.5*   < > 11.2* 10.4* 10.9* 13.2 12.7  HCT 28.3*   < > 32.5* 30.7* 31.9* 38.0 35.5*  MCV 86.0   < > 83.8 85.5 85.1 83.3 82.2  PLT 138*   < > 144* 177 218 412* 332   < > = values in this interval not displayed.    Basic Metabolic Panel: Recent Labs  Lab 06/16/20 0249 06/17/20 0340 06/18/20 0710 06/21/20 0144  NA 129* 131* 134* 132*  K 4.1 3.8 4.0 3.1*  CL 89* 89* 90* 87*  CO2 27 31 31 30   GLUCOSE 129* 167* 98 133*  BUN 19 21 23  30*  CREATININE 0.67 0.58 0.65 0.96  CALCIUM 8.1* 8.4* 8.5* 8.7*  MG 1.7 2.1  --   --   PHOS  --  3.5 3.2  --    GFR: Estimated Creatinine Clearance: 55.4 mL/min (by C-G formula based on SCr of 0.96 mg/dL). Recent Labs  Lab 06/15/20 0156 06/16/20 0249 06/17/20 0340 06/18/20 0710 06/21/20 0144 06/21/20 0611  PROCALCITON <0.10  --   --   --   --   --   WBC 4.7   < > 4.6 6.6 24.4* 18.2*   < > = values in this interval not displayed.    Liver Function Tests: No results for input(s): AST, ALT, ALKPHOS, BILITOT, PROT, ALBUMIN in the last 168 hours. No results for input(s): LIPASE, AMYLASE in the last 168 hours. No results for input(s): AMMONIA in the last 168 hours.  ABG    Component Value Date/Time   HCO3 27.1 05/28/2020 1835   O2SAT 93.3 06/01/2020 1835     Coagulation Profile: No results for input(s): INR, PROTIME in the last  168 hours.  Cardiac Enzymes: No results for input(s): CKTOTAL, CKMB, CKMBINDEX, TROPONINI in the last 168 hours.  HbA1C: No results found for: HGBA1C  CBG: Recent Labs  Lab 06/20/20 1616 06/20/20 1935 06/21/20 0016 06/21/20 0357 06/21/20 0826  GLUCAP 132* 184* 107* 140* 143*   The patient is critically ill with multiple organ systems failure and requires high complexity decision making for assessment and support, frequent evaluation and titration of therapies, application of advanced monitoring technologies and extensive interpretation of multiple databases. Critical Care Time devoted to patient care services described in this note independent of APP/resident time (if applicable)  is 30 minutes.   Sherrilyn Rist MD [facility] Personal pager: [phone] If unanswered, please page CCM On-call: [phone]

## 2020-06-21 NOTE — Progress Notes (Signed)
Patient ID: Kristina Ramos, female   DOB: June 14, 1941, 79 y.o.   MRN: [id number]   This NP visited patient at the bedside as a follow up for palliative medicine needs and emotional support.  Patient is lethagic and difficult to arouse.  Sister at bedside.  She continues with weakness and dyspnea requiring high flow oxygen.  Spoke with daugher at length today by telephone.  Created space and opportunity for daughter to explore her thoughts and feelings regarding her mother's current medical situation.  She understands the seriousness of the current medical situation and understands the possibility that her mother may not recover from this.  She wants to remain hopeful but also does not want her mother to suffer needlessly.  Education offered on the difference between an aggressive medical intervention path and a palliative comfort path for this patient at this time in this situation. I talked with her daughter that at some point she may become the decision maker in making this shift to a comfort path, allowing a natural death.  Emotional support offered.  Plan of care -DNR/DNI -Continue with all offered and available medical interventions to prolong life.  Patient and her family are hopeful for improvement. Continue with full medical support -Ongoing assessment specific to response and outcomes of current treatment plan, watchful waiting,   ensuring that decisions are within the context of the patient's values and GOCs, and her comfort and dignity.  Questions and concerns addressed     Total time spent on the unit was 35 minutes.   Palliative medicine will continue to support holistically  Greater than 50% of the time was spent in counseling and coordination of care  Wadie Lessen NP  Palliative Medicine Team Team Phone # [phone] Pager [phone]  This nurse practitioner informed  the family that I will be out of the hospital until Monday morning.  If the patient is still hospitalized I  will follow-up at that time.  Call palliative medicine team phone # [phone] with questions or concerns.

## 2020-06-21 NOTE — Progress Notes (Signed)
PROGRESS NOTE  Kristina Ramos  DOB: 05-Jan-1941  PCP: Ann Held, [place] EUM:[id number]  DOA: 05/29/2020  LOS: 12 days   Chief Complaint  Patient presents with  . Weakness  . Altered Mental Status   Brief narrative: Kristina Ramos is a 79 y.o. female with PMH of follicular lymphoma, hypertension, hypothyroidism, iron deficiency anemia, recent COVID-19 pneumonia. Patient presented to the ED on 06/15/2020 with complaint of weakness.  Timeline of events per previous documentations as below:  01/26/2020>> Pfizer SARS COV-2 vaccine #1 02/20/2020 >>Pfizer SARS COV-2 vaccine #2 04/03/2020>>6th cycle of maintenance Gazyva   6/7-6/11 Admission for Covid Pneumonia.  Treated with remdesivir, IV steroids. 6/11- Discharged home on decadron which she completed on 6/16.  06/20/2020- Re-admission for worsening dyspnea, fever up to 101.1, and increasing oxygen requirements.  Restarted on IV steroids,  06/15/2020>> Transferred to SDU for increasing oxygen demands to 30 L HHFNC (60%), CXR with more pronounced airspace opacities / worsening atelectasis 6/27 - refused BIPAP, solumedrol dose adjusted to 125mg  q6h 6/29 - Palliative care/TRH/PCCM multi-disciplinary conference with patient and family: full support, DNR if arrests, no intubation  Subjective: Patient was seen and examined this morning. Pleasant elderly Caucasian female.  Anxious.  Frustrated with persistent high oxygen dependence.  Son at bedside. Patient remains on heated high flow oxygen through nasal cannula at 40 L/min and 100% FiO2.  She is on scheduled Solu-Medrol 125 mg twice daily IV. She feels anxious this morning. Labs from this morning with sodium 132, potassium 3.1, BUN/creatinine 30/0.96, WBC count 18,000. Chest x-ray with persistent diffuse interstitial and airspace process.  Assessment/Plan: Acute on chronic hypoxic respiratory due to ARDS due to covid-19 pneumonia:  -Timeline of events as above. -Readmitted on 6/19 for  persistent hypoxemia, fever. -Imaging and blood gas consistent with ARDS. -Patient remains on heated high flow oxygen through nasal cannula at 40 L/min and 100% FiO2.  She is on scheduled Solu-Medrol 125 mg twice daily IV. -Chest x-ray with persistent diffuse interstitial and airspace process. -Patient seems to be anxious this morning probably also contributed by high-dose steroids. -Start on Xanax 0.5 mg twice daily as needed. -Renal function so far stable but BUN/creatinine ratio is trending up.  We will give 1 dose of Lasix IV 40 mg today. -Continue chest therapy.  Acute on chronic HFpEF -mild exacerbation treated empirically.  -LVEF is hyperdynamic on echo with G1DD and normal RV and IVC.  -Repeat Lasix IV 40 mg 1 dose today.  Relative adrenal insufficiency: -Significant global symptomatic improvement with reinitiation of steroids.  -Steroids as above, will need slow taper.   Pancytopenia:  -Lymphopenia on admission with normocytic anemia and thrombocytopenia. -Likely related to viral infection with hemodilutional process with IVF. -Splenomegaly related to lymphoma likely contributing. -Monitoring. Much improved.  Hyponatremia:  -Worsened with diuresis. Improving.  Sodium 132 today.  Hypokalemia:  -Due to Lasix.  Potassium 3.1 today.  Supplemented.  Continue monitoring   Follicular lymphoma:  -follows with Dr. 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  Continue to monitor blood pressure.  Mobility: Limited mobility due to high oxygen dependence.  Out of bed as tolerated.  Nutritional status: Body mass index is 37.76 kg/m.     Diet Order            Diet regular Room  service appropriate? No; Fluid consistency: Thin  Diet effective now                 Code Status:   Code Status: DNR  DVT prophylaxis: enoxaparin (LOVENOX) injection 40 mg Start: 06/10/2020 2230  Antimicrobials:  None Fluid: None  Consultants: Critical care, palliative Family Communication:  Discussed with son at bedside Status is: Inpatient  Remains inpatient appropriate because:Hemodynamically unstable, Unsafe d/c plan and Inpatient level of care appropriate due to severity of illness   Dispo: The patient is from: Home              Anticipated d/c is to: Home              Anticipated d/c date is: 2 days              Patient currently is not medically stable to d/c.       Infusions:    Scheduled Meds: . benzonatate  100 mg Oral BID  . Chlorhexidine Gluconate Cloth  6 each Topical Daily  . cholecalciferol  2,000 Units Oral Daily  . enoxaparin (LOVENOX) injection  40 mg Subcutaneous QHS  . insulin aspart  3-9 Units Subcutaneous Q4H  . levothyroxine  75 mcg Oral Q0600  . mouth rinse  15 mL Mouth Rinse BID  . methylPREDNISolone (SOLU-MEDROL) injection  125 mg Intravenous Q12H  . potassium chloride  20 mEq Oral BID    Antimicrobials: Anti-infectives (From admission, onward)   Start     Dose/Rate Route Frequency Ordered Stop   06/10/20 2200  vancomycin (VANCOREADY) IVPB 1250 mg/250 mL  Status:  Discontinued        1,250 mg 166.7 mL/hr over 90 Minutes Intravenous Every 24 hours 06/10/20 0530 06/10/20 1542   06/10/20 1700  valACYclovir (VALTREX) tablet 1,000 mg  Status:  Discontinued        1,000 mg Oral Daily 06/10/20 1610 06/10/20 1612   06/10/20 0615  ceFEPIme (MAXIPIME) 2 g in sodium chloride 0.9 % 100 mL IVPB  Status:  Discontinued        2 g 200 mL/hr over 30 Minutes Intravenous Every 8 hours 06/10/20 0522 06/10/20 0829   06/07/2020 2145  vancomycin (VANCOREADY) IVPB 2000 mg/400 mL        2,000 mg 200 mL/hr over 120 Minutes Intravenous  Once 06/14/2020 2144 06/10/20 0135   06/19/2020 2145  ceFEPIme (MAXIPIME) 2 g in sodium chloride 0.9 % 100 mL IVPB         2 g 200 mL/hr over 30 Minutes Intravenous  Once 05/26/2020 2144 05/27/2020 2237      PRN meds: acetaminophen, ALPRAZolam, chlorpheniramine-HYDROcodone, guaiFENesin, senna   Objective: Vitals:   06/21/20 0500 06/21/20 0800  BP: (!) 136/52   Pulse: 90   Resp: (!) 21   Temp:  (!) 97.5 F (36.4 C)  SpO2: (!) 86%     Intake/Output Summary (Last 24 hours) at 06/21/2020 0908 Last data filed at 06/21/2020 0400 Gross per 24 hour  Intake 240 ml  Output 2100 ml  Net -1860 ml   Filed Weights   06/12/2020 1807  Weight: 99.8 kg   Weight change:  Body mass index is 37.76 kg/m.   Physical Exam: General exam: Appears calm and comfortable.  Not in physical distress except for high oxygen dependence Skin: No rashes, lesions or ulcers. HEENT: Atraumatic, normocephalic, supple neck, no obvious bleeding Lungs: Mild crackles in both bases, more on the left CVS: Regular rate and rhythm, no  murmur GI/Abd soft, nontender, nondistended, bowel sound present CNS: Alert, awake, oriented x3 Psychiatry: Anxious Extremities: No pedal edema, no calf tenderness  Data Review: I have personally reviewed the laboratory data and studies available.  Recent Labs  Lab 06/15/20 0156 06/15/20 0156 06/16/20 0249 06/17/20 0340 06/18/20 0710 06/21/20 0144 06/21/20 0611  WBC 4.7   < > 5.8 4.6 6.6 24.4* 18.2*  NEUTROABS 4.2  --  5.0 4.2 5.9 21.7*  --   HGB 9.5*   < > 11.2* 10.4* 10.9* 13.2 12.7  HCT 28.3*   < > 32.5* 30.7* 31.9* 38.0 35.5*  MCV 86.0   < > 83.8 85.5 85.1 83.3 82.2  PLT 138*   < > 144* 177 218 412* 332   < > = values in this interval not displayed.   Recent Labs  Lab 06/16/20 0249 06/17/20 0340 06/18/20 0710 06/21/20 0144  NA 129* 131* 134* 132*  K 4.1 3.8 4.0 3.1*  CL 89* 89* 90* 87*  CO2 27 31 31 30   GLUCOSE 129* 167* 98 133*  BUN 19 21 23  30*  CREATININE 0.67 0.58 0.65 0.96  CALCIUM 8.1* 8.4* 8.5* 8.7*  MG 1.7 2.1  --   --   PHOS  --  3.5 3.2  --     Signed, Terrilee Croak, MD [facility] Pager: [phone] (Secure Chat preferred). 06/21/2020

## 2020-06-21 NOTE — Progress Notes (Signed)
OT Cancellation Note  Patient Details Name: Kristina Ramos MRN: [id number] DOB: 1941/08/17   Cancelled Treatment:    Reason Eval/Treat Not Completed: Medical issues which prohibited therapy (Increasing oxygen needs and respiratory distress.)  Lenward Chancellor 06/21/2020, 3:59 PM

## 2020-06-21 NOTE — Progress Notes (Signed)
PT Cancellation Note  Patient Details Name: Kristina Ramos MRN: [id number] DOB: March 13, 1941   Cancelled Treatment:    Reason Eval/Treat Not Completed: Medical issues which prohibited therapy, this Am, respiratory distress.   Claretha Cooper 06/21/2020, 3:39 PM  [place] Pager [phone] Office [phone]

## 2020-06-21 NOTE — Progress Notes (Signed)
Patient ID: Kristina Ramos, female   DOB: 04-28-1941, 79 y.o.   MRN: [id number]   This NP visited patient at the bedside as a follow up for palliative medicine needs and emotional support.  Patient is alert and oriented.    She continues with weakness and shortness of breath requiring high flow oxygen.   Met at bedside with daughter/Lillie and son/ Emitte for continued conversation regarding current medcial situation.   Patient and family updated by Dr Pennie Banter and Dr Auburn Bilberry.  Created space and opportunity for paient and her family to explore thoughts and feelings regarding current medical situation.  Attempted to illicit values and GOCs important to patient and family.  Education regarding the  difference between an aggressive intervention path and palliative comfort path was had.     Both patient and family verbalize understanding of the seriousness of current medical situation.  All remain hopeful for improvement  Patient remains open to all offered and available  medical interventions to prolong life.   Plan of care -DNR/DNI-documented today, family members in support of -Continue with all offered and available medical interventions to prolong life.    Patient and her family are hopeful for improvement and ultimately ability for patient to return to her home.  Continue with full medical support  Encouraged AROM exercise as demonstrated yesterday, efforts at nutritional intake and hydration.  Emotional support offered.  Discussed with patient the importance of continued conversation with her  family and their  medical providers regarding overall plan of care and treatment options,  ensuring decisions are within the context of the patients values and GOCs.  Questions and concerns addressed     Total time spent on the unit was 35 minutes.   Palliative medicine will continue to support holistically  Greater than 50% of the time was spent in counseling and coordination of care  Wadie Lessen NP  Palliative Medicine Team Team Phone # [phone] Pager [phone]

## 2020-06-21 DEATH — deceased

## 2020-06-22 DIAGNOSIS — R0600 Dyspnea, unspecified: Secondary | ICD-10-CM

## 2020-06-22 LAB — GLUCOSE, CAPILLARY
Glucose-Capillary: 116 mg/dL — ABNORMAL HIGH (ref 70–99)
Glucose-Capillary: 134 mg/dL — ABNORMAL HIGH (ref 70–99)
Glucose-Capillary: 152 mg/dL — ABNORMAL HIGH (ref 70–99)
Glucose-Capillary: 177 mg/dL — ABNORMAL HIGH (ref 70–99)
Glucose-Capillary: 179 mg/dL — ABNORMAL HIGH (ref 70–99)
Glucose-Capillary: 180 mg/dL — ABNORMAL HIGH (ref 70–99)

## 2020-06-22 LAB — COMPREHENSIVE METABOLIC PANEL
ALT: 22 U/L (ref 0–44)
AST: 15 U/L (ref 15–41)
Albumin: 2.9 g/dL — ABNORMAL LOW (ref 3.5–5.0)
Alkaline Phosphatase: 46 U/L (ref 38–126)
Anion gap: 11 (ref 5–15)
BUN: 32 mg/dL — ABNORMAL HIGH (ref 8–23)
CO2: 35 mmol/L — ABNORMAL HIGH (ref 22–32)
Calcium: 8.7 mg/dL — ABNORMAL LOW (ref 8.9–10.3)
Chloride: 88 mmol/L — ABNORMAL LOW (ref 98–111)
Creatinine, Ser: 0.77 mg/dL (ref 0.44–1.00)
GFR calc Af Amer: >60 mL/min (ref >60)
GFR calc non Af Amer: >60 mL/min (ref >60)
Glucose, Bld: 125 mg/dL — ABNORMAL HIGH (ref 70–99)
Potassium: 3.7 mmol/L (ref 3.5–5.1)
Sodium: 134 mmol/L — ABNORMAL LOW (ref 135–145)
Total Bilirubin: 0.7 mg/dL (ref 0.3–1.2)
Total Protein: 5.4 g/dL — ABNORMAL LOW (ref 6.5–8.1)

## 2020-06-22 LAB — CBC
HCT: 35.3 % — ABNORMAL LOW (ref 36.0–46.0)
Hemoglobin: 12.2 g/dL (ref 12.0–15.0)
MCH: 29.3 pg (ref 26.0–34.0)
MCHC: 34.6 g/dL (ref 30.0–36.0)
MCV: 84.7 fL (ref 80.0–100.0)
Platelets: 272 10*3/uL (ref 150–400)
RBC: 4.17 MIL/uL (ref 3.87–5.11)
RDW: 13.6 % (ref 11.5–15.5)
WBC: 15.6 10*3/uL — ABNORMAL HIGH (ref 4.0–10.5)
nRBC: 0 % (ref 0.0–0.2)

## 2020-06-22 MED ORDER — POLYETHYLENE GLYCOL 3350 17 G PO PACK
17.0000 g | PACK | Freq: Every day | ORAL | Status: DC
  Administered 2020-06-22 – 2020-06-25 (×2): 17 g via ORAL
  Filled 2020-06-22 (×2): qty 1

## 2020-06-22 MED ORDER — MORPHINE SULFATE (PF) 2 MG/ML IV SOLN
1.0000 mg | INTRAVENOUS | Status: AC | PRN
  Administered 2020-06-22: 1 mg via INTRAVENOUS
  Administered 2020-06-22: 0.5 mg via INTRAVENOUS
  Administered 2020-06-24 – 2020-06-25 (×4): 1 mg via INTRAVENOUS
  Filled 2020-06-22 (×6): qty 1

## 2020-06-22 MED ORDER — FUROSEMIDE 10 MG/ML IJ SOLN
40.0000 mg | Freq: Once | INTRAMUSCULAR | Status: AC
  Administered 2020-06-22: 40 mg via INTRAVENOUS
  Filled 2020-06-22: qty 4

## 2020-06-22 NOTE — Progress Notes (Signed)
OT Cancellation Note  Patient Details Name: Kristina Ramos MRN: [id number] DOB: 10-23-1941   Cancelled Treatment:    Reason Eval/Treat Not Completed: Patient not medically ready (Patient's oxygen needs continue to increase with poor activity tolerance.)  Bennye Nix L [place] Strollo 06/22/2020, 5:35 PM

## 2020-06-22 NOTE — Progress Notes (Signed)
eLink Physician-Brief Progress Note Patient Name: Kristina Ramos DOB: [birth year] MRN: [id number]   Date of Service  06/22/2020  HPI/Events of Note  Request for AM CMP N 132, K 3.1  eICU Interventions  CBP and CBC ordered for the morning     Intervention Category Major Interventions: Electrolyte abnormality - evaluation and management Minor Interventions: Other:  Judd Lien 06/22/2020, 4:43 AM

## 2020-06-22 NOTE — Progress Notes (Signed)
PT Cancellation Note  Patient Details Name: Kristina Ramos MRN: [id number] DOB: [birth year]   Cancelled Treatment:    Reason Eval/Treat Not Completed: Medical issues which prohibited therapy, continues to tolerate very little mobility  With significant desaturation. Will check back 7/5.     Claretha Cooper 06/22/2020, 1:35 PM [place] Pager [phone] Office [phone]

## 2020-06-22 NOTE — TOC Progression Note (Signed)
Transition of Care [facility]) - Progression Note    Patient Details  Name: Kristina Ramos MRN: [id number] Date of Birth: [birth year]  [facility] CM/SW Contact  Leeroy Cha, RN Phone Number: 06/22/2020, 8:42 AM  Clinical Narrative:    hfnc at 40l/min, covid positive pna and dyspnea, Iv solu-medrol, WBC 15.6 poor outlook.  DNR with medical treatment. Plan follow for progression and for toc needs.   Expected Discharge Plan: [place] Barriers to Discharge: No Barriers Identified  Expected Discharge Plan and Services Expected Discharge Plan: [place]   Discharge Planning Services: CM Consult   Living arrangements for the past 2 months: Single Family Home                           HH Arranged: RN, PT, Nurse's Aide [place] Agency: [place] Date [place]: 06/11/20 Time HH Agency Contacted: 1000 Representative spoke with at [place]: [place] ([place]) Interventions    Readmission Risk Interventions No flowsheet data found.

## 2020-06-22 NOTE — Progress Notes (Signed)
NAME:  Kristina Ramos, MRN:  [id number], DOB:  May 02, 1941, LOS: 24 ADMISSION DATE:  06/05/2020, CONSULTATION DATE:  6/25 REFERRING MD:  Bonner Puna, CHIEF COMPLAINT:  dyspnea  Brief History   79 yo female admitted initially on 6/7 with COVID 19 pneumonia, discharged 6/11 and returned on 6/19 with worsening dyspnea.  PCCM consulted for worsening hypoxemic respiratory failure on 6/27.   Past Medical History  Anemia, arthritis, depression, follicular lymphoma, GERD, Heart murmur, adenomatous colonic polyps, hyperlipidemia, hypertension  Significant Hospital Events   01/26/2020>> Pfizer SARS COV-2 vaccine #1 02/20/2020 >>Pfizer SARS COV-2 vaccine #2 04/03/2020>> 6th cycle of maintenance Gazyva  xxxxxxxxxxx 6/7-6/11 Admission for Covid Pneumonia 6/7-6/11 Remdesivir treatment 6/11 Discharged home on decadron 06/06/2020 >> Re-admission for worsening dyspnea, fever, and increasing oxygen requirements 6/7-6/16 Decadron treatment ,  was restarted 6/19 06/15/2020>> Transfer to SDU for increasing oxygen demands, CXR with more pronounced airspace opacities / worsening atelectasis  6/25 - Currently on 30 L HHFNC ( 60%) Sats are 96%, RR is 18 Pt. Was speaking in full sentences  on phone and in no distress when I examined her. She desaturates with minimal exertion, and takes about 10 minutes to rebound to sats > 92% She " feels" when she is hypoxic.  WBC is 4.7 Procalcitonin is < 0.10 Na is 134 ( Up from 126 on admission )  CRP has up trended within the last 24 hours from 3.6 to 6.9 D-dimer down trended to  2.25 ( was 2.55 on  6/24) Oxygen demands have increased to 30 L HHFNC ( 60%) last 24 hours CXR 6/25 shows increase in airspace opacities , more pronounced and worsening atelectasis Net + 1255 since admission Good response to Lasix 40 today, with 2600 cc's out this am  6/26 - still on HHFNC and face mask. Afebrile. Last echo 2019. On dvt proph. Sitting and able to got to toilet but that leaves her  tired. Othewrise WOB ok  6/27 refused BIPAP, solumedrol dose adjusted to 125mg  q6h  6/29 Palliative care/TRH/PCCM multi-disciplinary conference with patient and family: full support, DNR if arrests, no intubation  7/1 on high flow oxygen, still short of breath  7/2 complaining of pain and discomfort with deep breathing  Consults:  6/25 PCCM  Procedures:  none  Significant Diagnostic Tests:  6/19 CT head > NAICP 6/27 Echo > LVEF 70-75%, RV size and function is normal  Micro Data:  6/7 SARS COV2 > positive 6/19 blood > neg 6/19 urine >  6/24 RVP > neg 6/25 COVID IgG > negative 6/26 urine strep > negative  Antimicrobials:  6/19>> Vanc 2 grams x 1 dose 6/19-6/20>> Cefepime  Interim history/subjective:   Says she feels more short of breath Having some chest discomfort around lower rib cage Anxious  Objective   Blood pressure (!) 144/77, pulse 98, temperature (!) 97.4 F (36.3 C), temperature source Axillary, resp. rate (!) 26, height 5\' 4"  (1.626 m), weight 99.8 kg, SpO2 (!) 82 %.    FiO2 (%):  [40 %-100 %] 100 %   Intake/Output Summary (Last 24 hours) at 06/22/2020 0905 Last data filed at 06/22/2020 0700 Gross per 24 hour  Intake --  Output 1050 ml  Net -1050 ml   Filed Weights   06/04/2020 1807  Weight: 99.8 kg    Examination:  General: Uncomfortable, tachypneic HENT: Moist oral mucosa PULM: Bibasal crackles CV: S1-S2 appreciated with no murmur GI: BS+, soft, nontender MSK: normal bulk and tone Neuro: awake, alert, no distress, MAEW  Labs reviewed Chest x-ray 06/21/2020 shows extensive infiltrative process  Resolved Hospital Problem list     Assessment & Plan:  ARDS due to COVID 19 pneumonia: immunocompromised, prolonged course Given advanced age and immunocompromised state it is exceedingly unlikely she would survive mechanical ventilation Symptoms improved on 6/28 after increasing solumedrol 7/2 > worsening dyspnea, crackles in bases, likely acute  pulmonary edema Continue Solu-Medrol 125 every 12 She continues on diuresis lasix 40 today Continue heated high flow Keep saturations greater than 85%  Risk of decompensation is very high  Continue all supportive measures at present  Rib cage pain and discomfort -Morphine 1 mg IV every 2 as needed  Hypokalemia -Continue to replete  Leukocytosis -Related to steroids  Follicular lymphoma Hold immunosuppressive cancer treatment  CODE STATUS: Lengthy multidisciplinary conversation 6/30 with palliative medicine, Dr. Bonner Puna and Dr. Lake Bells with the patient's daughter, son and bedside nurse.  Code status DNR but continuing full medical support.  Best practice:   Per TRH  Labs   CBC: Recent Labs  Lab 06/16/20 0249 06/16/20 0249 06/17/20 0340 06/18/20 0710 06/21/20 0144 06/21/20 0611 06/22/20 0533  WBC 5.8   < > 4.6 6.6 24.4* 18.2* 15.6*  NEUTROABS 5.0  --  4.2 5.9 21.7*  --   --   HGB 11.2*   < > 10.4* 10.9* 13.2 12.7 12.2  HCT 32.5*   < > 30.7* 31.9* 38.0 35.5* 35.3*  MCV 83.8   < > 85.5 85.1 83.3 82.2 84.7  PLT 144*   < > 177 218 412* 332 272   < > = values in this interval not displayed.    Basic Metabolic Panel: Recent Labs  Lab 06/16/20 0249 06/17/20 0340 06/18/20 0710 06/21/20 0144 06/22/20 0533  NA 129* 131* 134* 132* 134*  K 4.1 3.8 4.0 3.1* 3.7  CL 89* 89* 90* 87* 88*  CO2 27 31 31 30  35*  GLUCOSE 129* 167* 98 133* 125*  BUN 19 21 23  30* 32*  CREATININE 0.67 0.58 0.65 0.96 0.77  CALCIUM 8.1* 8.4* 8.5* 8.7* 8.7*  MG 1.7 2.1  --   --   --   PHOS  --  3.5 3.2  --   --    GFR: Estimated Creatinine Clearance: 66.5 mL/min (by C-G formula based on SCr of 0.77 mg/dL). Recent Labs  Lab 06/18/20 0710 06/21/20 0144 06/21/20 0611 06/22/20 0533  WBC 6.6 24.4* 18.2* 15.6*    Liver Function Tests: Recent Labs  Lab 06/22/20 0533  AST 15  ALT 22  ALKPHOS 46  BILITOT 0.7  PROT 5.4*  ALBUMIN 2.9*   No results for input(s): LIPASE, AMYLASE in the  last 168 hours. No results for input(s): AMMONIA in the last 168 hours.  ABG    Component Value Date/Time   HCO3 27.1 05/22/2020 1835   O2SAT 93.3 06/20/2020 1835     Coagulation Profile: No results for input(s): INR, PROTIME in the last 168 hours.  Cardiac Enzymes: No results for input(s): CKTOTAL, CKMB, CKMBINDEX, TROPONINI in the last 168 hours.  HbA1C: No results found for: HGBA1C  CBG: Recent Labs  Lab 06/21/20 1641 06/21/20 2105 06/21/20 2339 06/22/20 0422 06/22/20 0746  GLUCAP 148* 219* 152* 116* 152*   The patient is critically ill with multiple organ systems failure and requires high complexity decision making for assessment and support, frequent evaluation and titration of therapies, application of advanced monitoring technologies and extensive interpretation of multiple databases. Critical Care Time devoted to patient care services  described in this note independent of APP/resident time (if applicable)  is 30 minutes.   Sherrilyn Rist MD [facility] Personal pager: [phone] If unanswered, please page CCM On-call: [phone]

## 2020-06-22 NOTE — Progress Notes (Signed)
PROGRESS NOTE  Kristina Ramos  DOB: 04-30-1941  PCP: Ann Held, [place] DXA:[id number]  DOA: 05/29/2020  LOS: 13 days   Chief Complaint  Patient presents with  . Weakness  . Altered Mental Status   Brief narrative: Kristina Ramos is a 79 y.o. female with PMH of follicular lymphoma, hypertension, hypothyroidism, iron deficiency anemia, recent COVID-19 pneumonia. Patient presented to the ED on 06/12/2020 with complaint of weakness.  Timeline of events per previous documentations as below:  01/26/2020>> Pfizer SARS COV-2 vaccine #1 02/20/2020 >>Pfizer SARS COV-2 vaccine #2 04/03/2020>>6th cycle of maintenance Gazyva   6/7-6/11 Admission for Covid Pneumonia.  Treated with remdesivir, IV steroids. 6/11- Discharged home on decadron which she completed on 6/16.  06/03/2020- Re-admission for worsening dyspnea, fever up to 101.1, and increasing oxygen requirements.  Restarted on IV steroids,  06/15/2020>> Transferred to SDU for increasing oxygen demands to 30 L HHFNC (60%), CXR with more pronounced airspace opacities / worsening atelectasis 6/27 - refused BIPAP, solumedrol dose adjusted to 125mg  q6h 6/29 - Palliative care/TRH/PCCM multi-disciplinary conference with patient and family: full support, DNR if arrests, no intubation  Subjective: Patient was seen and examined this morning.  Sitting up in bed.  Remains anxious, dyspneic.  High dependence on oxygen. Chart reviewed. No fever, tachypneic to 20s, oxygen saturation more than 80% on 40L HHFNC at 100%. Labs from this morning show sodium 134, potassium 3.7, serum bicarb 35, BUN/creatinine 32/0.77, WBC count 15.6  Assessment/Plan: Acute on chronic hypoxic respiratory due to ARDS due to covid-19 pneumonia:  -Timeline of events as above. -Readmitted on 6/19 for persistent hypoxemia, fever. -Imaging and blood gas consistent with ARDS. -Patient continues to remain on heated high flow oxygen through nasal cannula at 40 L/min and 100% FiO2.   She is on scheduled Solu-Medrol 125 mg twice daily IV. -Chest x-ray repeated on 7/1 continues to show persistent diffuse interstitial and airspace process. -Anxiety contributing as well. -Started on Xanax 0.5 mg twice daily as needed. -Renal function so far stable but BUN/creatinine ratio is trending up.  Because of persistent dyspnea, 1 dose of Lasix IV 40 mg was given again this morning. -Continue chest therapy.  Acute on chronic HFpEF -mild exacerbation treated empirically.  -LVEF is hyperdynamic on echo with G1DD and normal RV and IVC.  -Repeat Lasix IV 40 mg 1 dose today. -Renal function is stable so far on Lasix.  Relative adrenal insufficiency: -Significant global symptomatic improvement with reinitiation of steroids.  -Steroids as above, will need slow taper.   Pancytopenia:  -Lymphopenia on admission with normocytic anemia and thrombocytopenia. -Likely related to viral infection with hemodilutional process with IVF. -Splenomegaly related to lymphoma likely contributing. -All cell counts have now improved.  Hyponatremia:  -Worsened with diuresis. Improving.  Sodium 134 today.  Hypokalemia:  -Due to Lasix.  Potassium 3.1 yesterday.  Repleted.  3.7 today.  Follicular lymphoma:  -follows with Dr. 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  Continue to monitor blood pressure.  Mobility: Limited mobility due to high oxygen dependence.  Out of bed as tolerated.  Nutritional status: Body mass index is 37.76 kg/m.     Diet Order            Diet regular Room service appropriate? No; Fluid consistency: Thin  Diet effective now  Code Status:   Code Status: DNR  DVT prophylaxis: enoxaparin (LOVENOX) injection 40 mg Start: 06/20/2020  2230  Antimicrobials:  None Fluid: None  Consultants: Critical care, palliative Family Communication:  Daughter Ms. Katy Apo at bedside. Status is: Inpatient  Remains inpatient appropriate because:Hemodynamically unstable, Unsafe d/c plan and Inpatient level of care appropriate due to severity of illness   Dispo: The patient is from: Home              Anticipated d/c is to: Home              Anticipated d/c date is: More than 3 days              Patient currently is not medically stable to d/c.  Infusions:    Scheduled Meds: . benzonatate  100 mg Oral BID  . Chlorhexidine Gluconate Cloth  6 each Topical Daily  . cholecalciferol  2,000 Units Oral Daily  . enoxaparin (LOVENOX) injection  40 mg Subcutaneous QHS  . insulin aspart  3-9 Units Subcutaneous Q4H  . levothyroxine  75 mcg Oral Q0600  . mouth rinse  15 mL Mouth Rinse BID  . methylPREDNISolone (SOLU-MEDROL) injection  125 mg Intravenous Q12H  . polyethylene glycol  17 g Oral Daily  . potassium chloride  20 mEq Oral BID    Antimicrobials: Anti-infectives (From admission, onward)   Start     Dose/Rate Route Frequency Ordered Stop   06/10/20 2200  vancomycin (VANCOREADY) IVPB 1250 mg/250 mL  Status:  Discontinued        1,250 mg 166.7 mL/hr over 90 Minutes Intravenous Every 24 hours 06/10/20 0530 06/10/20 1542   06/10/20 1700  valACYclovir (VALTREX) tablet 1,000 mg  Status:  Discontinued        1,000 mg Oral Daily 06/10/20 1610 06/10/20 1612   06/10/20 0615  ceFEPIme (MAXIPIME) 2 g in sodium chloride 0.9 % 100 mL IVPB  Status:  Discontinued        2 g 200 mL/hr over 30 Minutes Intravenous Every 8 hours 06/10/20 0522 06/10/20 0829   06/05/2020 2145  vancomycin (VANCOREADY) IVPB 2000 mg/400 mL        2,000 mg 200 mL/hr over 120 Minutes Intravenous  Once 05/28/2020 2144 06/10/20 0135   06/11/2020 2145  ceFEPIme (MAXIPIME) 2 g in sodium chloride 0.9 % 100 mL IVPB        2 g 200 mL/hr over 30 Minutes Intravenous  Once 06/17/2020  2144 06/15/2020 2237      PRN meds: acetaminophen, ALPRAZolam, chlorpheniramine-HYDROcodone, guaiFENesin, morphine injection, senna   Objective: Vitals:   06/22/20 1104 06/22/20 1118  BP:  131/62  Pulse: 96 98  Resp: (!) 28 (!) 31  Temp:    SpO2: 91% (!) 89%    Intake/Output Summary (Last 24 hours) at 06/22/2020 1403 Last data filed at 06/22/2020 0700 Gross per 24 hour  Intake --  Output 650 ml  Net -650 ml   Filed Weights   06/02/2020 1807  Weight: 99.8 kg   Weight change:  Body mass index is 37.76 kg/m.   Physical Exam: General exam: Appears calm and comfortable.  Mild to moderate respiratory distress Skin: No rashes, lesions or ulcers. HEENT: Atraumatic, normocephalic, supple neck, no obvious bleeding Lungs: Mild crackles in both bases, more on the left CVS: Regular rate and rhythm, no murmur GI/Abd soft, nontender, nondistended, bowel sound present CNS: Alert, awake, oriented x3 Psychiatry: Anxious Extremities: No pedal edema, no calf tenderness  Data Review: I  have personally reviewed the laboratory data and studies available.  Recent Labs  Lab 06/16/20 0249 06/16/20 0249 06/17/20 0340 06/18/20 0710 06/21/20 0144 06/21/20 0611 06/22/20 0533  WBC 5.8   < > 4.6 6.6 24.4* 18.2* 15.6*  NEUTROABS 5.0  --  4.2 5.9 21.7*  --   --   HGB 11.2*   < > 10.4* 10.9* 13.2 12.7 12.2  HCT 32.5*   < > 30.7* 31.9* 38.0 35.5* 35.3*  MCV 83.8   < > 85.5 85.1 83.3 82.2 84.7  PLT 144*   < > 177 218 412* 332 272   < > = values in this interval not displayed.   Recent Labs  Lab 06/16/20 0249 06/17/20 0340 06/18/20 0710 06/21/20 0144 06/22/20 0533  NA 129* 131* 134* 132* 134*  K 4.1 3.8 4.0 3.1* 3.7  CL 89* 89* 90* 87* 88*  CO2 27 31 31 30  35*  GLUCOSE 129* 167* 98 133* 125*  BUN 19 21 23  30* 32*  CREATININE 0.67 0.58 0.65 0.96 0.77  CALCIUM 8.1* 8.4* 8.5* 8.7* 8.7*  MG 1.7 2.1  --   --   --   PHOS  --  3.5 3.2  --   --     Signed, Terrilee Croak, MD [facility] Pager: [phone] (Secure Chat preferred). 06/22/2020

## 2020-06-23 DIAGNOSIS — R0600 Dyspnea, unspecified: Secondary | ICD-10-CM

## 2020-06-23 DIAGNOSIS — J069 Acute upper respiratory infection, unspecified: Secondary | ICD-10-CM

## 2020-06-23 LAB — GLUCOSE, CAPILLARY
Glucose-Capillary: 136 mg/dL — ABNORMAL HIGH (ref 70–99)
Glucose-Capillary: 203 mg/dL — ABNORMAL HIGH (ref 70–99)
Glucose-Capillary: 182 mg/dL — ABNORMAL HIGH (ref 70–99)
Glucose-Capillary: 115 mg/dL — ABNORMAL HIGH (ref 70–99)
Glucose-Capillary: 131 mg/dL — ABNORMAL HIGH (ref 70–99)
Glucose-Capillary: 147 mg/dL — ABNORMAL HIGH (ref 70–99)

## 2020-06-23 MED ORDER — ALPRAZOLAM 1 MG PO TABS
1.0000 mg | ORAL_TABLET | Freq: Every evening | ORAL | Status: DC | PRN
  Administered 2020-06-23: 1 mg via ORAL
  Filled 2020-06-23: qty 1

## 2020-06-23 MED ORDER — SALINE SPRAY 0.65 % NA SOLN
1.0000 | NASAL | Status: DC | PRN
  Filled 2020-06-23: qty 44

## 2020-06-23 MED ORDER — ALPRAZOLAM 0.5 MG PO TABS
0.5000 mg | ORAL_TABLET | Freq: Every day | ORAL | Status: DC | PRN
  Administered 2020-06-24: 0.5 mg via ORAL
  Filled 2020-06-23: qty 1

## 2020-06-23 NOTE — Progress Notes (Signed)
Patient ID: Kristina Ramos, female   DOB: October 31, 1941, 78 y.o.   MRN: [id number]  Palliative Care Progress note  Case discussed with Dr. Lake Bells and Dr. Pietro Cassis.  I saw and examined Kristina Ramos this morning and again in the afternoon.  During morning encounter she was much more sleepy and did not really participate much in conversation. I then followed up again in the afternoon when her son was present and she was much more able to interact and participate in discussion.  She reports that she continues to feel poorly with significant dyspnea and weakness. Reports that she was pleased to be able to get to a chair yesterday but it' "about sent everyone over the edge around here" when her oxygen drops. Her other main concern is that she is still feeling constipated. She does have an enema ordered for later if she desires.  I called and was able to speak with her daughter over the phone.  We discussed clinical course as well as wishes moving forward in regard to care plan in light of her mother's continued decline over the long-term with Kristina Ramos intermittently reporting that she is, "miserable.". We discussed difference between a aggressive medical intervention path and a palliative, comfort focused care path.  Values and goals of care important to patient and family were attempted to be elicited.  I then followed up again with Kristina Ramos in the afternoon to discuss further with her and her son. She was more awake and alert in the afternoon than she was during my initial encounter this morning. She does continue to endorse that she feels poorly overall, but she also remains invested in plan to continue with continued hope that she will begin to display clinical improvement.  Questions and concerns addressed.   PMT will continue to support holistically.  Emotional support offered.  Plan of care -DNR/DNI -Discussed with patient's daughter today.  She reports talking this morning with Dr. Lake Bells.  She understands  that there is real possibility that Kristina Ramos will not improve further than she has to this point in time and is a continued risk for decompensation.  She expressed understanding recommendation for consideration for comfort care and that she is open to this possibility if her mother desires to shift to more of a focus on comfort.  At the same time, she is clear that decisions regarding care plan are a group discussion in their family and she and her brother will defer to Kristina Ramos to make decisions as long as she is able to participate in the conversation.  Additionally, she reports that her brother (patient's son) continues to hope for improvement and is invested in plan to continue with any and all offered interventions. -I met today with patient and her son.  Kristina Ramos was more awake and alert in the afternoon than she was when I checked in on her this morning.  She reports feeling poorly, but is not really able to give me specifics.  She and her son remain hopeful for continued improvement but understand that she has had a long hospitalization and chance for improvement over long term remains unknown. -Palliative to continue to visit for continued conversation regarding goals of care based upon clinical course.  For now, remains for continued support and Kristina Ramos today indicates that she wishes to continue with current interventions.  Questions and concerns addressed     Total time spent on the unit was 50 minutes.   Palliative medicine will continue  to support holistically  Greater than 50%  of this time was spent counseling and coordinating care related to the above assessment and plan.  Micheline Rough, MD [facility] [phone]

## 2020-06-23 NOTE — Progress Notes (Signed)
PROGRESS NOTE  Kristina Ramos  DOB: [birth year]  PCP: Ann Held, [place] XTA:[id number]  DOA: 05/22/2020  LOS: 14 days   Chief Complaint  Patient presents with  . Weakness  . Altered Mental Status   Brief narrative: Kristina Ramos is a 79 y.o. female with PMH of follicular lymphoma, hypertension, hypothyroidism, iron deficiency anemia, recent COVID-19 pneumonia. Patient presented to the ED on 05/27/2020 with complaint of weakness.  Timeline of events per previous documentations as below:  01/26/2020>> Pfizer SARS COV-2 vaccine #1 02/20/2020 >>Pfizer SARS COV-2 vaccine #2 04/03/2020>>6th cycle of maintenance Gazyva   6/7-6/11 Admission for Covid Pneumonia.  Treated with remdesivir, IV steroids. 6/11- Discharged home on decadron which she completed on 6/16.  06/15/2020- Re-admission for worsening dyspnea, fever up to 101.1, and increasing oxygen requirements.  Restarted on IV steroids,  06/15/2020>> Transferred to SDU for increasing oxygen demands to 30 L HHFNC (60%), CXR with more pronounced airspace opacities / worsening atelectasis 6/27 - refused BIPAP, solumedrol dose adjusted to 125mg  q6h 6/29 - Palliative care/TRH/PCCM multi-disciplinary conference with patient and family: full support, DNR if arrests, no intubation  Subjective: Patient was seen and examined this morning.  Sitting up in bed.  Daughter at bedside. Continues to remain dependent on heated high flow oxygen.  Was transiently weaned down from 4 L to 30 L this morning.  Had to be increased up again. Anxiety partly controlled with Xanax. Chart reviewed. No labs this morning.  Assessment/Plan: Acute on chronic hypoxic respiratory due to ARDS due to covid-19 pneumonia:  -Timeline of events as above. -Readmitted on 6/19 for persistent hypoxemia, fever. -Imaging and blood gas consistent with ARDS. -Patient continues to remain on heated high flow oxygen through nasal cannula at 40 L/min and 100% FiO2.  She is on  scheduled Solu-Medrol 125 mg twice daily IV.  I would continue the same dose for now. -Chest x-ray repeated on 7/1 continues to show persistent diffuse interstitial and airspace process. -Anxiety contributing as well. -Currently on Xanax 0.5 mg twice daily as needed.  I will increase the nightly dose of Xanax to 1 mg. -Renal function so far stable but BUN/creatinine ratio is trending up.  Because of persistent dyspnea, every morning, patient is getting Lasix IV 40 mg.  This morning, her blood pressure is low in the 90s.  If the blood pressure improves during the day, I will give another dose of IV Lasix. -Continue chest therapy.  Acute on chronic HFpEF -mild exacerbation treated empirically.  -LVEF is hyperdynamic on echo with G1DD and normal RV and IVC.  -Being judiciously diuresed.  Relative adrenal insufficiency: -Significant global symptomatic improvement with reinitiation of steroids.  -Steroids as above, will need slow taper.   Pancytopenia:  -Lymphopenia on admission with normocytic anemia and thrombocytopenia. -Likely related to viral infection with hemodilutional process with IVF. -Splenomegaly related to lymphoma likely contributing. -All cell counts have now improved.  Hyponatremia:  -Worsened with diuresis. Improving.  Sodium 134 on last blood work on 7/2  Hypokalemia:  -Due to Lasix.  Continue replacement and rechecking.  Follicular lymphoma:  -follows with Dr. 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  Continue to monitor blood pressure.  Constipation -Soapsuds enema today.  Mobility: Limited mobility due to high oxygen dependence.  Out of bed as  tolerated.  Nutritional status: Body mass index is 37.76 kg/m.     Diet Order            Diet  regular Room service appropriate? No; Fluid consistency: Thin  Diet effective now                Code Status:   Code Status: DNR  DVT prophylaxis: enoxaparin (LOVENOX) injection 40 mg Start: 06/07/2020 2230  Antimicrobials:  None Fluid: None  Consultants: Critical care, palliative Family Communication:  Daughter Ms. Katy Apo at bedside. Status is: Inpatient  Remains inpatient appropriate because:Hemodynamically unstable, Unsafe d/c plan and Inpatient level of care appropriate due to severity of illness   Dispo: The patient is from: Home              Anticipated d/c is to: Home.  I wonder if LTAC is an option.              Anticipated d/c date is: More than 3 days              Patient currently is not medically stable to d/c.  Infusions:    Scheduled Meds: . benzonatate  100 mg Oral BID  . Chlorhexidine Gluconate Cloth  6 each Topical Daily  . cholecalciferol  2,000 Units Oral Daily  . enoxaparin (LOVENOX) injection  40 mg Subcutaneous QHS  . insulin aspart  3-9 Units Subcutaneous Q4H  . levothyroxine  75 mcg Oral Q0600  . mouth rinse  15 mL Mouth Rinse BID  . methylPREDNISolone (SOLU-MEDROL) injection  125 mg Intravenous Q12H  . polyethylene glycol  17 g Oral Daily    Antimicrobials: Anti-infectives (From admission, onward)   Start     Dose/Rate Route Frequency Ordered Stop   06/10/20 2200  vancomycin (VANCOREADY) IVPB 1250 mg/250 mL  Status:  Discontinued        1,250 mg 166.7 mL/hr over 90 Minutes Intravenous Every 24 hours 06/10/20 0530 06/10/20 1542   06/10/20 1700  valACYclovir (VALTREX) tablet 1,000 mg  Status:  Discontinued        1,000 mg Oral Daily 06/10/20 1610 06/10/20 1612   06/10/20 0615  ceFEPIme (MAXIPIME) 2 g in sodium chloride 0.9 % 100 mL IVPB  Status:  Discontinued        2 g 200 mL/hr over 30 Minutes Intravenous Every 8 hours 06/10/20 0522 06/10/20 0829   06/07/2020 2145  vancomycin (VANCOREADY) IVPB 2000 mg/400 mL        2,000 mg 200 mL/hr over 120  Minutes Intravenous  Once 06/20/2020 2144 06/10/20 0135   05/30/2020 2145  ceFEPIme (MAXIPIME) 2 g in sodium chloride 0.9 % 100 mL IVPB        2 g 200 mL/hr over 30 Minutes Intravenous  Once 05/26/2020 2144 06/20/2020 2237      PRN meds: acetaminophen, ALPRAZolam, ALPRAZolam, chlorpheniramine-HYDROcodone, guaiFENesin, morphine injection, senna, sodium chloride   Objective: Vitals:   06/23/20 0432 06/23/20 0809  BP: (!) 97/49   Pulse:    Resp: (!) 28   Temp:  (!) 97.1 F (36.2 C)  SpO2:  (!) 86%    Intake/Output Summary (Last 24 hours) at 06/23/2020 1016 Last data filed at 06/23/2020 0800 Gross per 24 hour  Intake 700 ml  Output 150 ml  Net 550 ml   Filed Weights   06/20/2020 1807  Weight: 99.8 kg   Weight change:  Body mass index is 37.76 kg/m.   Physical Exam: General exam: Appears calm and comfortable.  Continues to be in mild to moderate respiratory distress Skin: No rashes, lesions or ulcers. HEENT: Atraumatic, normocephalic, supple neck, no obvious bleeding Lungs: Mild crackles in both bases, more on the right. CVS: Regular rate and rhythm, no murmur GI/Abd soft, nontender, nondistended, bowel sound present CNS: Alert, awake, oriented x3 Psychiatry: Anxious Extremities: No pedal edema, no calf tenderness  Data Review: I have personally reviewed the laboratory data and studies available.  Recent Labs  Lab 06/17/20 0340 06/18/20 0710 06/21/20 0144 06/21/20 0611 06/22/20 0533  WBC 4.6 6.6 24.4* 18.2* 15.6*  NEUTROABS 4.2 5.9 21.7*  --   --   HGB 10.4* 10.9* 13.2 12.7 12.2  HCT 30.7* 31.9* 38.0 35.5* 35.3*  MCV 85.5 85.1 83.3 82.2 84.7  PLT 177 218 412* 332 272   Recent Labs  Lab 06/17/20 0340 06/18/20 0710 06/21/20 0144 06/22/20 0533  NA 131* 134* 132* 134*  K 3.8 4.0 3.1* 3.7  CL 89* 90* 87* 88*  CO2 31 31 30  35*  GLUCOSE 167* 98 133* 125*  BUN 21 23 30* 32*  CREATININE 0.58 0.65 0.96 0.77  CALCIUM 8.4* 8.5* 8.7* 8.7*  MG 2.1  --   --   --   PHOS 3.5  3.2  --   --     Signed, Terrilee Croak, MD [facility] Pager: [phone] (Secure Chat preferred). 06/23/2020

## 2020-06-23 NOTE — Progress Notes (Signed)
NAME:  Kristina Ramos, MRN:  [id number], DOB:  07/02/1941, LOS: 55 ADMISSION DATE:  05/30/2020, CONSULTATION DATE:  6/25 REFERRING MD:  Bonner Puna, CHIEF COMPLAINT:  dyspnea  Brief History   79 yo female admitted initially on 6/7 with COVID 19 pneumonia, discharged 6/11 and returned on 6/19 with worsening dyspnea.  PCCM consulted for worsening hypoxemic respiratory failure on 6/27.   Past Medical History  Anemia, arthritis, depression, follicular lymphoma, GERD, Heart murmur, adenomatous colonic polyps, hyperlipidemia, hypertension  Significant Hospital Events   01/26/2020>> Pfizer SARS COV-2 vaccine #1 02/20/2020 >>Pfizer SARS COV-2 vaccine #2 04/03/2020>> 6th cycle of maintenance Gazyva  xxxxxxxxxxx 6/7-6/11 Admission for Covid Pneumonia 6/7-6/11 Remdesivir treatment 6/11 Discharged home on decadron 06/06/2020 >> Re-admission for worsening dyspnea, fever, and increasing oxygen requirements 6/7-6/16 Decadron treatment ,  was restarted 6/19 06/15/2020>> Transfer to SDU for increasing oxygen demands, CXR with more pronounced airspace opacities / worsening atelectasis  6/25 - Currently on 30 L HHFNC ( 60%) Sats are 96%, RR is 18 Pt. Was speaking in full sentences  on phone and in no distress when I examined her. She desaturates with minimal exertion, and takes about 10 minutes to rebound to sats > 92% She " feels" when she is hypoxic.  WBC is 4.7 Procalcitonin is < 0.10 Na is 134 ( Up from 126 on admission )  CRP has up trended within the last 24 hours from 3.6 to 6.9 D-dimer down trended to  2.25 ( was 2.55 on  6/24) Oxygen demands have increased to 30 L HHFNC ( 60%) last 24 hours CXR 6/25 shows increase in airspace opacities , more pronounced and worsening atelectasis Net + 1255 since admission Good response to Lasix 40 today, with 2600 cc's out this am  6/26 - still on HHFNC and face mask. Afebrile. Last echo 2019. On dvt proph. Sitting and able to got to toilet but that leaves her  tired. Othewrise WOB ok  6/27 refused BIPAP, solumedrol dose adjusted to 125mg  q6h  6/29 Palliative care/TRH/PCCM multi-disciplinary conference with patient and family: full support, DNR if arrests, no intubation  Consults:  6/25 PCCM  Procedures:    Significant Diagnostic Tests:  6/19 CT head > NAICP 6/27 Echo > LVEF 70-75%, RV size and function is normal  Micro Data:  6/7 SARS COV2 > positive 6/19 blood > neg 6/19 urine >  6/24 RVP > neg 6/25 COVID IgG > negative 6/26 urine strep > negative  Antimicrobials:  6/19>> Vanc 2 grams x 1 dose 6/19-6/20>> Cefepime  Interim history/subjective:   Feels more tired Was out of bed yesterday, made her very tired to do so Not eating Started low dose morphine, last dose was yesterday at 5pm  Objective   Blood pressure (!) 97/49, pulse 77, temperature (!) 96.9 F (36.1 C), temperature source Axillary, resp. rate (!) 28, height 5\' 4"  (1.626 m), weight 99.8 kg, SpO2 (!) 86 %.    FiO2 (%):  [60 %-100 %] 100 %   Intake/Output Summary (Last 24 hours) at 06/23/2020 0819 Last data filed at 06/22/2020 2130 Gross per 24 hour  Intake 580 ml  Output 150 ml  Net 430 ml   Filed Weights   06/18/2020 1807  Weight: 99.8 kg    Examination:  General:  Fatigued, increased work of breathing in bed HENT: NCAT OP clear PULM: Crackles bases B, normal effort CV: RRR, no mgr GI: BS+, soft, nontender MSK: normal bulk and tone Neuro:drowsy but will wake to voice, oriented,  follows commands     Resolved Hospital Problem list     Assessment & Plan:  ARDS due to COVID 19 pneumonia: immunocompromised, prolonged course 7/3 worsening fatigue, anorexia, malaise She is actively dying now Acute pulmonary edema again 7/3 Lasix again today Continue morphine prn Xanax prn Recommend full comfort measures, daughter agrees, will discuss with primary team Continue heated high flow and NRB mask today  Follicular lymphoma Continue to hold  immunosuppressive cancer treatment  CODE STATUS: DNR  Best practice:   Per TRH  Labs   CBC: Recent Labs  Lab 06/17/20 0340 06/18/20 0710 06/21/20 0144 06/21/20 0611 06/22/20 0533  WBC 4.6 6.6 24.4* 18.2* 15.6*  NEUTROABS 4.2 5.9 21.7*  --   --   HGB 10.4* 10.9* 13.2 12.7 12.2  HCT 30.7* 31.9* 38.0 35.5* 35.3*  MCV 85.5 85.1 83.3 82.2 84.7  PLT 177 218 412* 332 478    Basic Metabolic Panel: Recent Labs  Lab 06/17/20 0340 06/18/20 0710 06/21/20 0144 06/22/20 0533  NA 131* 134* 132* 134*  K 3.8 4.0 3.1* 3.7  CL 89* 90* 87* 88*  CO2 31 31 30  35*  GLUCOSE 167* 98 133* 125*  BUN 21 23 30* 32*  CREATININE 0.58 0.65 0.96 0.77  CALCIUM 8.4* 8.5* 8.7* 8.7*  MG 2.1  --   --   --   PHOS 3.5 3.2  --   --    GFR: Estimated Creatinine Clearance: 66.5 mL/min (by C-G formula based on SCr of 0.77 mg/dL). Recent Labs  Lab 06/18/20 0710 06/21/20 0144 06/21/20 0611 06/22/20 0533  WBC 6.6 24.4* 18.2* 15.6*    Liver Function Tests: Recent Labs  Lab 06/22/20 0533  AST 15  ALT 22  ALKPHOS 46  BILITOT 0.7  PROT 5.4*  ALBUMIN 2.9*   No results for input(s): LIPASE, AMYLASE in the last 168 hours. No results for input(s): AMMONIA in the last 168 hours.  ABG    Component Value Date/Time   HCO3 27.1 05/25/2020 1835   O2SAT 93.3 06/18/2020 1835     Coagulation Profile: No results for input(s): INR, PROTIME in the last 168 hours.  Cardiac Enzymes: No results for input(s): CKTOTAL, CKMB, CKMBINDEX, TROPONINI in the last 168 hours.  HbA1C: No results found for: HGBA1C  CBG: Recent Labs  Lab 06/22/20 1234 06/22/20 1652 06/22/20 1930 06/22/20 2355 06/23/20 0327  GLUCAP 180* 179* 177* 134* 136*     Critical care time: 30 minutes discussing end of life management and prognosis with family     Roselie Awkward, MD [facility] Pager: [phone] Cell: [phone] If no response, call [phone]

## 2020-06-24 LAB — GLUCOSE, CAPILLARY
Glucose-Capillary: 129 mg/dL — ABNORMAL HIGH (ref 70–99)
Glucose-Capillary: 141 mg/dL — ABNORMAL HIGH (ref 70–99)
Glucose-Capillary: 148 mg/dL — ABNORMAL HIGH (ref 70–99)
Glucose-Capillary: 148 mg/dL — ABNORMAL HIGH (ref 70–99)
Glucose-Capillary: 149 mg/dL — ABNORMAL HIGH (ref 70–99)
Glucose-Capillary: 164 mg/dL — ABNORMAL HIGH (ref 70–99)

## 2020-06-24 LAB — BASIC METABOLIC PANEL
Anion gap: 17 — ABNORMAL HIGH (ref 5–15)
BUN: 36 mg/dL — ABNORMAL HIGH (ref 8–23)
CO2: 28 mmol/L (ref 22–32)
Calcium: 8.4 mg/dL — ABNORMAL LOW (ref 8.9–10.3)
Chloride: 91 mmol/L — ABNORMAL LOW (ref 98–111)
Creatinine, Ser: 0.72 mg/dL (ref 0.44–1.00)
GFR calc Af Amer: >60 mL/min (ref >60)
GFR calc non Af Amer: >60 mL/min (ref >60)
Glucose, Bld: 153 mg/dL — ABNORMAL HIGH (ref 70–99)
Potassium: 4.8 mmol/L (ref 3.5–5.1)
Sodium: 136 mmol/L (ref 135–145)

## 2020-06-24 LAB — CBC WITH DIFFERENTIAL/PLATELET
Abs Immature Granulocytes: 0 10*3/uL (ref 0.00–0.07)
Basophils Absolute: 0 10*3/uL (ref 0.0–0.1)
Basophils Relative: 0 %
Eosinophils Absolute: 0 10*3/uL (ref 0.0–0.5)
Eosinophils Relative: 0 %
HCT: 33.6 % — ABNORMAL LOW (ref 36.0–46.0)
Hemoglobin: 12.2 g/dL (ref 12.0–15.0)
Lymphocytes Relative: 2 %
Lymphs Abs: 0.4 10*3/uL — ABNORMAL LOW (ref 0.7–4.0)
MCH: 29.5 pg (ref 26.0–34.0)
MCHC: 36.3 g/dL — ABNORMAL HIGH (ref 30.0–36.0)
MCV: 81.2 fL (ref 80.0–100.0)
Monocytes Absolute: 0.2 10*3/uL (ref 0.1–1.0)
Monocytes Relative: 1 %
Neutro Abs: 17.7 10*3/uL — ABNORMAL HIGH (ref 1.7–7.7)
Neutrophils Relative %: 97 %
Platelets: 215 10*3/uL (ref 150–400)
RBC: 4.14 MIL/uL (ref 3.87–5.11)
RDW: 13.6 % (ref 11.5–15.5)
WBC: 18.2 10*3/uL — ABNORMAL HIGH (ref 4.0–10.5)
nRBC: 0 % (ref 0.0–0.2)

## 2020-06-24 LAB — MAGNESIUM: Magnesium: 2.4 mg/dL (ref 1.7–2.4)

## 2020-06-24 LAB — PHOSPHORUS: Phosphorus: 4.1 mg/dL (ref 2.5–4.6)

## 2020-06-24 MED ORDER — POLYETHYLENE GLYCOL 3350 17 G PO PACK
17.0000 g | PACK | Freq: Every day | ORAL | Status: DC | PRN
  Administered 2020-06-24: 17 g via ORAL

## 2020-06-24 MED ORDER — SENNA 8.6 MG PO TABS
2.0000 | ORAL_TABLET | Freq: Two times a day (BID) | ORAL | Status: DC
  Administered 2020-06-24 – 2020-06-25 (×3): 17.2 mg via ORAL
  Filled 2020-06-24 (×4): qty 2

## 2020-06-24 MED ORDER — ALPRAZOLAM 1 MG PO TABS
1.0000 mg | ORAL_TABLET | Freq: Three times a day (TID) | ORAL | Status: DC | PRN
  Administered 2020-06-24: 1 mg via ORAL
  Filled 2020-06-24 (×3): qty 1

## 2020-06-24 MED ORDER — SORBITOL 70 % SOLN
960.0000 mL | TOPICAL_OIL | Freq: Once | ORAL | Status: DC | PRN
  Filled 2020-06-24: qty 473

## 2020-06-24 MED ORDER — BISACODYL 10 MG RE SUPP
10.0000 mg | Freq: Every day | RECTAL | Status: DC | PRN
  Administered 2020-06-24: 10 mg via RECTAL
  Filled 2020-06-24: qty 1

## 2020-06-24 NOTE — Progress Notes (Signed)
PROGRESS NOTE  Annitta Needs Challis  DOB: [birth year]  PCP: Ann Held, [place] WYO:[id number]  DOA: 06/19/2020  LOS: 15 days   Chief Complaint  Patient presents with  . Weakness  . Altered Mental Status   Brief narrative: Kristina Ramos is a 79 y.o. female with PMH of follicular lymphoma, hypertension, hypothyroidism, iron deficiency anemia, recent COVID-19 pneumonia. Patient presented to the ED on 06/20/2020 with complaint of weakness.  Timeline of events per previous documentations as below:  01/26/2020>> Pfizer SARS COV-2 vaccine #1 02/20/2020 >>Pfizer SARS COV-2 vaccine #2 04/03/2020>>6th cycle of maintenance Gazyva   6/7-6/11 Admission for Covid Pneumonia.  Treated with remdesivir, IV steroids. 6/11- Discharged home on decadron which she completed on 6/16.  06/10/2020- Re-admission for worsening dyspnea, fever up to 101.1, and increasing oxygen requirements.  Restarted on IV steroids,  06/15/2020>> Transferred to SDU for increasing oxygen demands to 30 L HHFNC (60%), CXR with more pronounced airspace opacities / worsening atelectasis 6/27 - refused BIPAP, solumedrol dose adjusted to 125mg  q6h 6/29 - Palliative care/TRH/PCCM multi-disciplinary conference with patient and family: full support, DNR if arrests, no intubation  Subjective: Patient was seen and examined this morning. Sitting up in bed.  Feels tired.  Looks tired. Continues to have high dependence on oxygen. She states her biggest concern for last 2 to 3 days has been inability to have a bowel movement.   Suppository given this morning.  Highly appreciate palliative care involvement in revisiting goals of care with patient and family.  IV Solu-Medrol has been stopped.  Xanax dose liberalized.  Assessment/Plan: Acute on chronic hypoxic respiratory due to ARDS due to covid-19 pneumonia:  -Timeline of events as above. -Readmitted on 6/19 for persistent hypoxemia, fever. -Imaging and blood gas consistent with ARDS. -For  last several days, patient has been persistently hypoxic and requiring heated high flow oxygen through nasal cannula at 40 L/min and 100% FiO2.  -Highly appreciate critical care and palliative care involvement in revisiting goals of care with patient and family.  IV Solu-Medrol has been stopped.  Xanax dose liberalized. -Start on IV morphine.  Acute on chronic HFpEF -mild exacerbation treated empirically.  -LVEF is hyperdynamic on echo with G1DD and normal RV and IVC.  -Being judiciously diuresed intermittently.  Relative adrenal insufficiency: -Significant global symptomatic improvement with reinitiation of steroids.  -Steroids as above, will need slow taper.   Pancytopenia:  -Lymphopenia on admission with normocytic anemia and thrombocytopenia. -Likely related to viral infection with hemodilutional process with IVF. -Splenomegaly related to lymphoma likely contributing. -All cell counts have now improved.  Hypokalemia:  -Secondary to diuresis.  Labs this morning with potassium 4.8.  Follicular lymphoma:  -follows with Dr. 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  Continue to monitor blood pressure.  Constipation -Soapsuds enema ordered yesterday.  Suppository given today  Mobility: Limited mobility due to high oxygen dependence.  Out of bed as tolerated.  Nutritional status: Body mass index is 37.76 kg/m.     Diet Order            Diet regular Room service appropriate? No; Fluid consistency: Thin  Diet effective now                Code Status:   Code Status: DNR  DVT prophylaxis: enoxaparin (LOVENOX)  injection 40 mg Start: 06/03/2020 2230  Antimicrobials:  None Fluid: None  Consultants: Critical care, palliative Family Communication:  Poor  prognosis.  Discussed with patient's daughter and son. Status is: Inpatient  Remains inpatient appropriate because:Hemodynamically unstable, Unsafe d/c plan and Inpatient level of care appropriate due to severity of illness   Dispo: The patient is from: Home              Anticipated d/c is to: Home.  I wonder if LTAC is an option.              Anticipated d/c date is: More than 3 days              Patient currently is not medically stable to d/c.  Infusions:    Scheduled Meds: . benzonatate  100 mg Oral BID  . Chlorhexidine Gluconate Cloth  6 each Topical Daily  . cholecalciferol  2,000 Units Oral Daily  . enoxaparin (LOVENOX) injection  40 mg Subcutaneous QHS  . insulin aspart  3-9 Units Subcutaneous Q4H  . levothyroxine  75 mcg Oral Q0600  . mouth rinse  15 mL Mouth Rinse BID  . polyethylene glycol  17 g Oral Daily  . senna  2 tablet Oral BID    Antimicrobials: Anti-infectives (From admission, onward)   Start     Dose/Rate Route Frequency Ordered Stop   06/10/20 2200  vancomycin (VANCOREADY) IVPB 1250 mg/250 mL  Status:  Discontinued        1,250 mg 166.7 mL/hr over 90 Minutes Intravenous Every 24 hours 06/10/20 0530 06/10/20 1542   06/10/20 1700  valACYclovir (VALTREX) tablet 1,000 mg  Status:  Discontinued        1,000 mg Oral Daily 06/10/20 1610 06/10/20 1612   06/10/20 0615  ceFEPIme (MAXIPIME) 2 g in sodium chloride 0.9 % 100 mL IVPB  Status:  Discontinued        2 g 200 mL/hr over 30 Minutes Intravenous Every 8 hours 06/10/20 0522 06/10/20 0829   05/29/2020 2145  vancomycin (VANCOREADY) IVPB 2000 mg/400 mL        2,000 mg 200 mL/hr over 120 Minutes Intravenous  Once 05/23/2020 2144 06/10/20 0135   06/12/2020 2145  ceFEPIme (MAXIPIME) 2 g in sodium chloride 0.9 % 100 mL IVPB        2 g 200 mL/hr over 30 Minutes Intravenous  Once 06/06/2020 2144 05/23/2020 2237      PRN meds: acetaminophen, ALPRAZolam, bisacodyl, chlorpheniramine-HYDROcodone, guaiFENesin, morphine  injection, polyethylene glycol, sodium chloride, sorbitol, milk of mag, mineral oil, glycerin (SMOG) enema   Objective: Vitals:   06/24/20 1436 06/24/20 1445  BP:  109/60  Pulse: 89 94  Resp: (!) 24 (!) 25  Temp:    SpO2: (!) 88% (!) 88%    Intake/Output Summary (Last 24 hours) at 06/24/2020 1514 Last data filed at 06/24/2020 0600 Gross per 24 hour  Intake 400 ml  Output 50 ml  Net 350 ml   Filed Weights   05/31/2020 1807  Weight: 99.8 kg   Weight change:  Body mass index is 37.76 kg/m.   Physical Exam: General exam: Appears calm and comfortable. Continues to be in mild to moderate respiratory distress. Skin: No rashes, lesions or ulcers. HEENT: Atraumatic, normocephalic, supple neck, no obvious bleeding. Lungs: Mild crackles in both bases, more on the right. CVS: Regular rate and rhythm, no murmur GI/Abd soft, nontender, nondistended, bowel sound present CNS: Alert, awake, oriented x3 Psychiatry: Anxious Extremities: No pedal edema,  no calf tenderness  Data Review: I have personally reviewed the laboratory data and studies available.  Recent Labs  Lab 06/18/20 0710 06/21/20 0144 06/21/20 0611 06/22/20 0533 06/24/20 0329  WBC 6.6 24.4* 18.2* 15.6* 18.2*  NEUTROABS 5.9 21.7*  --   --  17.7*  HGB 10.9* 13.2 12.7 12.2 12.2  HCT 31.9* 38.0 35.5* 35.3* 33.6*  MCV 85.1 83.3 82.2 84.7 81.2  PLT 218 412* 332 272 215   Recent Labs  Lab 06/18/20 0710 06/21/20 0144 06/22/20 0533 06/24/20 0329  NA 134* 132* 134* 136  K 4.0 3.1* 3.7 4.8  CL 90* 87* 88* 91*  CO2 31 30 35* 28  GLUCOSE 98 133* 125* 153*  BUN 23 30* 32* 36*  CREATININE 0.65 0.96 0.77 0.72  CALCIUM 8.5* 8.7* 8.7* 8.4*  MG  --   --   --  2.4  PHOS 3.2  --   --  4.1    Signed, Terrilee Croak, MD [facility] Pager: [phone] (Secure Chat preferred). 06/24/2020

## 2020-06-24 NOTE — Progress Notes (Addendum)
NAME:  Kristina Ramos, MRN:  [id number], DOB:  [birth year], LOS: 36 ADMISSION DATE:  06/16/2020, CONSULTATION DATE:  6/25 REFERRING MD:  Bonner Puna, CHIEF COMPLAINT:  dyspnea  Brief History   79 yo female admitted initially on 6/7 with COVID 19 pneumonia, discharged 6/11 and returned on 6/19 with worsening dyspnea.  PCCM consulted for worsening hypoxemic respiratory failure on 6/27.   Past Medical History  Anemia, arthritis, depression, follicular lymphoma, GERD, Heart murmur, adenomatous colonic polyps, hyperlipidemia, hypertension  Significant Hospital Events   01/26/2020>> Pfizer SARS COV-2 vaccine #1 02/20/2020 >>Pfizer SARS COV-2 vaccine #2 04/03/2020>> 6th cycle of maintenance Gazyva  xxxxxxxxxxx 6/7-6/11 Admission for Covid Pneumonia 6/7-6/11 Remdesivir treatment 6/11 Discharged home on decadron 06/06/2020 >> Re-admission for worsening dyspnea, fever, and increasing oxygen requirements 6/7-6/16 Decadron treatment ,  was restarted 6/19 06/15/2020>> Transfer to SDU for increasing oxygen demands, CXR with more pronounced airspace opacities / worsening atelectasis  6/25 - Currently on 30 L HHFNC ( 60%) Sats are 96%, RR is 18 Pt. Was speaking in full sentences  on phone and in no distress when I examined her. She desaturates with minimal exertion, and takes about 10 minutes to rebound to sats > 92% She " feels" when she is hypoxic.  WBC is 4.7 Procalcitonin is < 0.10 Na is 134 ( Up from 126 on admission )  CRP has up trended within the last 24 hours from 3.6 to 6.9 D-dimer down trended to  2.25 ( was 2.55 on  6/24) Oxygen demands have increased to 30 L HHFNC ( 60%) last 24 hours CXR 6/25 shows increase in airspace opacities , more pronounced and worsening atelectasis Net + 1255 since admission Good response to Lasix 40 today, with 2600 cc's out this am  6/26 - still on HHFNC and face mask. Afebrile. Last echo 2019. On dvt proph. Sitting and able to got to toilet but that leaves her  tired. Othewrise WOB ok  6/27 refused BIPAP, solumedrol dose adjusted to 125mg  q6h  6/29 Palliative care/TRH/PCCM multi-disciplinary conference with patient and family: full support, DNR if arrests, no intubation  Consults:  6/25 PCCM  Procedures:    Significant Diagnostic Tests:  6/19 CT head > NAICP 6/27 Echo > LVEF 70-75%, RV size and function is normal  Micro Data:  6/7 SARS COV2 > positive 6/19 blood > neg 6/19 urine >  6/24 RVP > neg 6/25 COVID IgG > negative 6/26 urine strep > negative  Antimicrobials:  6/19>> Vanc 2 grams x 1 dose 6/19-6/20>> Cefepime  Interim history/subjective:   Feels worse Constipated Focused on constipation  Objective   Blood pressure (!) 135/55, pulse 83, temperature (!) 96.8 F (36 C), temperature source Axillary, resp. rate (!) 25, height 5\' 4"  (1.626 m), weight 99.8 kg, SpO2 (!) 86 %.    FiO2 (%):  [100 %] 100 %   Intake/Output Summary (Last 24 hours) at 06/24/2020 0725 Last data filed at 06/24/2020 0600 Gross per 24 hour  Intake 760 ml  Output 475 ml  Net 285 ml   Filed Weights   06/18/2020 1807  Weight: 99.8 kg    Examination:  General:  Resting comfortably in bed HENT: NCAT OP clear PULM: Crackles bases B, increased effort CV: RRR, no mgr GI: BS infrequent, soft, nontender MSK: normal bulk and tone Neuro: awake, alert, no distress, MAEW    Resolved Hospital Problem list     Assessment & Plan:  ARDS due to COVID 19 pneumonia: immunocompromised, prolonged course 7/4  worsening work of breathing and oxygenation, no improvement despite diuresis She is actively dying now, complicated by anxiety and agitation Hold lasix Stop solumedrol> making agitation/anxiety worse Use morphine for relief of work of breathing and dyspnea Agree with xanax to help with anxiety Continue to recommend full comfort measures Continue heated high flow oxygen and NRB mask   Constipation Miralax, dulcolax today  CODE STATUS: DNR  Best  practice:   Per [facility] Daughter updated bedside extensively I advised the patient with the daughter and nurse present today that there was nothing more I could offer other than comfort measures  Labs   CBC: Recent Labs  Lab 06/18/20 0710 06/21/20 0144 06/21/20 0611 06/22/20 0533 06/24/20 0329  WBC 6.6 24.4* 18.2* 15.6* 18.2*  NEUTROABS 5.9 21.7*  --   --  17.7*  HGB 10.9* 13.2 12.7 12.2 12.2  HCT 31.9* 38.0 35.5* 35.3* 33.6*  MCV 85.1 83.3 82.2 84.7 81.2  PLT 218 412* 332 272 354    Basic Metabolic Panel: Recent Labs  Lab 06/18/20 0710 06/21/20 0144 06/22/20 0533 06/24/20 0329  NA 134* 132* 134* 136  K 4.0 3.1* 3.7 4.8  CL 90* 87* 88* 91*  CO2 31 30 35* 28  GLUCOSE 98 133* 125* 153*  BUN 23 30* 32* 36*  CREATININE 0.65 0.96 0.77 0.72  CALCIUM 8.5* 8.7* 8.7* 8.4*  MG  --   --   --  2.4  PHOS 3.2  --   --  4.1   GFR: Estimated Creatinine Clearance: 66.5 mL/min (by C-G formula based on SCr of 0.72 mg/dL). Recent Labs  Lab 06/21/20 0144 06/21/20 0611 06/22/20 0533 06/24/20 0329  WBC 24.4* 18.2* 15.6* 18.2*    Liver Function Tests: Recent Labs  Lab 06/22/20 0533  AST 15  ALT 22  ALKPHOS 46  BILITOT 0.7  PROT 5.4*  ALBUMIN 2.9*   No results for input(s): LIPASE, AMYLASE in the last 168 hours. No results for input(s): AMMONIA in the last 168 hours.  ABG    Component Value Date/Time   HCO3 27.1 05/27/2020 1835   O2SAT 93.3 06/16/2020 1835     Coagulation Profile: No results for input(s): INR, PROTIME in the last 168 hours.  Cardiac Enzymes: No results for input(s): CKTOTAL, CKMB, CKMBINDEX, TROPONINI in the last 168 hours.  HbA1C: No results found for: HGBA1C  CBG: Recent Labs  Lab 06/23/20 1207 06/23/20 1705 06/23/20 1947 06/23/20 2315 06/24/20 0324  GLUCAP 182* 115* 131* 147* 141*     Critical care time: n/a    Roselie Awkward, MD [place] [facility] Pager: [phone] Cell: [phone] If no response, call [phone]

## 2020-06-24 NOTE — Progress Notes (Signed)
Patient ID: Kristina Ramos, female   DOB: 07/26/1941, 79 y.o.   MRN: [id number]  Palliative Care Progress note  Reason for visit: Goals of care/symptom management  Discussed case with Dr. Lake Bells this morning.  He met again with patient and her daughter this morning and reviewed the fact that symptomatic management is really the only course we have to offer moving forward.  I saw and examined Kristina Ramos this morning.  Her daughter was also at the bedside.  She appears to be somewhat confused and is slightly agitated this morning.  She is focused on wanting to have another bowel movement.  I spoke with her and her daughter about continuing to maximize interventions that are likely to help her feel better while also evaluating the benefit we are getting from each aspect of her care and working to eliminate interventions that are not directed toward her feeling well.  Discussed that we have been working to maximize her medically without significant improvement and concern that decline will continue regardless of interventions moving forward.  Agree with Dr. Anastasia Pall plan to begin to taper steroids as they are likely making anxiety worse.  I discussed with Kristina Ramos and her daughter regarding liberalizing medications for pain, shortness of breath, anxiety moving forward.  Her daughter is certainly in agreement with this plan moving forward.  Kristina Ramos also reports that she agrees that we need to focus on her feeling better, but her primary concern continues to be regarding constipation.  Questions and concerns addressed.   PMT will continue to support holistically.  Emotional support offered.  Plan of care -DNR/DNI -Discussed again with patient and her daughter.  They also met with Dr. Lake Bells this morning.  We are continuing to focus on helping Kristina Ramos to symptomatically feel better as focusing on aggressive symptom management is really the only viable medical option moving forward. -Continue morphine as  needed for shortness of breath or increased work of breathing -I liberalized Xanax to 1 mg 3 times daily as needed for anxiety. -Continue to evaluate interventions on a daily basis to determine if they are still adding benefit of adding time or quality to her life.  Recommend we continue to discontinue interventions when they reach a point where they are not adding more benefit than burden (such as plan to decrease steroids today.) -Continue HFNC and nonrebreather.  At this point in time, high flow supplemental oxygen is allowing her to be awake, alert, and able to spend time with family.   There may come a point, however, where these are not adding to her quality of life.  Will continue to assess benefit/burden based on her clinical course. -Palliative to continue to visit for continued conversation regarding goals of care based upon clinical course.    Questions and concerns addressed     Total time spent on the unit was 40 minutes.   Palliative medicine will continue to support holistically  Greater than 50%  of this time was spent counseling and coordinating care related to the above assessment and plan.   Micheline Rough, MD [place] Team [phone]

## 2020-06-24 NOTE — Progress Notes (Signed)
OT Cancellation Note  Patient Details Name: [name] MRN: [id number] DOB: 07-27-1941   Cancelled Treatment:    Reason Eval/Treat Not Completed: Other (comment) RN states patient not medically stable at this time due to declining activity tolerance and continued high oxygen demands.   Kristina Ramos 06/24/2020, 4:40 PM

## 2020-06-25 LAB — GLUCOSE, CAPILLARY
Glucose-Capillary: 106 mg/dL — ABNORMAL HIGH (ref 70–99)
Glucose-Capillary: 98 mg/dL (ref 70–99)
Glucose-Capillary: 132 mg/dL — ABNORMAL HIGH (ref 70–99)

## 2020-06-25 MED ORDER — GLYCOPYRROLATE 0.2 MG/ML IJ SOLN: 0.2000 mg | INTRAMUSCULAR | Status: DC | PRN

## 2020-06-25 MED ORDER — GLYCOPYRROLATE 1 MG PO TABS: 1.0000 mg | ORAL_TABLET | ORAL | Status: DC | PRN

## 2020-06-25 MED ORDER — MORPHINE SULFATE (PF) 2 MG/ML IV SOLN: 2.0000 mg | INTRAVENOUS | Status: DC | PRN

## 2020-06-25 MED ORDER — CHLORHEXIDINE GLUCONATE CLOTH 2 % EX PADS: 6.0000 | MEDICATED_PAD | Freq: Every day | CUTANEOUS | Status: DC

## 2020-06-25 MED ORDER — POLYVINYL ALCOHOL 1.4 % OP SOLN: 1.0000 [drp] | Freq: Four times a day (QID) | OPHTHALMIC | Status: DC | PRN

## 2020-06-25 MED ORDER — MORPHINE 100MG IN NS 100ML (1MG/ML) PREMIX INFUSION
0.0000 mg/h | INTRAVENOUS | Status: DC
  Administered 2020-06-25: 5 mg/h via INTRAVENOUS
  Administered 2020-06-25 – 2020-06-26 (×5): 15 mg/h via INTRAVENOUS
  Filled 2020-06-25 (×9): qty 100

## 2020-06-25 MED ORDER — DEXTROSE 5 % IV SOLN: INTRAVENOUS | Status: DC

## 2020-06-25 MED ORDER — MORPHINE BOLUS VIA INFUSION
5.0000 mg | INTRAVENOUS | Status: DC | PRN
  Administered 2020-06-25 (×3): 5 mg via INTRAVENOUS
  Filled 2020-06-25: qty 5

## 2020-06-25 MED ORDER — DIPHENHYDRAMINE HCL 50 MG/ML IJ SOLN: 25.0000 mg | INTRAMUSCULAR | Status: DC | PRN

## 2020-06-25 MED ORDER — LORAZEPAM 2 MG/ML IJ SOLN
1.0000 mg | INTRAMUSCULAR | Status: DC | PRN
  Administered 2020-06-25: 2 mg via INTRAVENOUS
  Filled 2020-06-25: qty 1

## 2020-06-25 NOTE — Progress Notes (Signed)
Pt's work of breathing increased, daughter at bedside, discussed with Dr. Lake Bells about comfort care, v/o to give 1mg  of Morphine IV for extra dose while new comfort measure orders placed. Daughter aware and in agreement. Will continue to monitor

## 2020-06-25 NOTE — Progress Notes (Signed)
PROGRESS NOTE  Kristina Ramos  DOB: 1941/08/02  PCP: Ann Held, [place] KAJ:[id number]  DOA: 06/04/2020  LOS: 16 days   Chief Complaint  Patient presents with  . Weakness  . Altered Mental Status   Brief narrative: Kristina Ramos is a 79 y.o. female with PMH of follicular lymphoma, hypertension, hypothyroidism, iron deficiency anemia, recent COVID-19 pneumonia. Patient presented to the ED on 05/30/2020 with complaint of weakness.  Timeline of events per previous documentations as below:  01/26/2020>> Pfizer SARS COV-2 vaccine #1 02/20/2020 >>Pfizer SARS COV-2 vaccine #2 04/03/2020>>6th cycle of maintenance Gazyva   6/7-6/11 Admission for Covid Pneumonia.  Treated with remdesivir, IV steroids. 6/11- Discharged home on decadron which she completed on 6/16.  06/04/2020- Re-admission for worsening dyspnea, fever up to 101.1, and increasing oxygen requirements.  Restarted on IV steroids,  06/15/2020>> Transferred to SDU for increasing oxygen demands to 30 L HHFNC (60%), CXR with more pronounced airspace opacities / worsening atelectasis 6/27 - refused BIPAP, solumedrol dose adjusted to 125mg  q6h 6/29 - Palliative care/TRH/PCCM multi-disciplinary conference with patient and family: full support, DNR if arrests, no intubation  Subjective: Patient was seen and examined this morning.  Lethargic, tired, tachypneic.  Daughter at bedside. Seen by critical care Dr. Lake Bells earlier. After discussion with family, patient has been started on morphine drip which I completely agree with. I had a brief conversation with patient's daughter who is in agreement with the plan.  She asked me to turn down the oxygen flow.  Assessment/Plan: Acute on chronic hypoxic respiratory due to ARDS due to covid-19 pneumonia:  -Timeline of events as above. -Readmitted on 6/19 for persistent hypoxemia, fever. -Imaging and blood gas consistent with ARDS. -For last several days, patient has been persistently  hypoxic and requiring heated high flow oxygen through nasal cannula at 40 L/min and 100% FiO2.  Despite maximum effort, patient continues to be dyspneic and unable to maintain oxygen level. -Family has agreed to comfort care.   -Currently on morphine drip.  Oxygen flow has been turned down.   Acute on chronic HFpEF  Relative adrenal insufficiency:  Pancytopenia:   Hypokalemia:  -No further blood work planned  Follicular lymphoma:   Hypothyroidism:   HTN:   Mobility: Comfort care measures Nutritional status: Body mass index is 37.76 kg/m.     Diet Order            Diet regular Room service appropriate? No; Fluid consistency: Thin  Diet effective now                Code Status:   Code Status: DNR  DVT prophylaxis: Comfort care   Antimicrobials:  None Fluid: None  Consultants: Critical care, palliative Family Communication:  Poor prognosis.  Discussed with patient's daughter at bedside today.    Status is: Inpatient  Remains inpatient appropriate because highly dependent on oxygen.  Unable to discharge.  Comfort care now.  Dispo: The patient is from: Home              Anticipated d/c is to: Poor prognosis.  Likely to have in-hospital mortality                Infusions:  . dextrose    . morphine 15 mg/hr (06/25/20 0943)    Scheduled Meds: . benzonatate  100 mg Oral BID  . Chlorhexidine Gluconate Cloth  6 each Topical Daily  . mouth rinse  15 mL Mouth Rinse BID    Antimicrobials: Anti-infectives (From  admission, onward)   Start     Dose/Rate Route Frequency Ordered Stop   06/10/20 2200  vancomycin (VANCOREADY) IVPB 1250 mg/250 mL  Status:  Discontinued        1,250 mg 166.7 mL/hr over 90 Minutes Intravenous Every 24 hours 06/10/20 0530 06/10/20 1542   06/10/20 1700  valACYclovir (VALTREX) tablet 1,000 mg  Status:  Discontinued        1,000 mg Oral Daily 06/10/20 1610 06/10/20 1612   06/10/20 0615  ceFEPIme (MAXIPIME) 2 g in sodium chloride 0.9 %  100 mL IVPB  Status:  Discontinued        2 g 200 mL/hr over 30 Minutes Intravenous Every 8 hours 06/10/20 0522 06/10/20 0829   06/06/2020 2145  vancomycin (VANCOREADY) IVPB 2000 mg/400 mL        2,000 mg 200 mL/hr over 120 Minutes Intravenous  Once 06/01/2020 2144 06/10/20 0135   05/26/2020 2145  ceFEPIme (MAXIPIME) 2 g in sodium chloride 0.9 % 100 mL IVPB        2 g 200 mL/hr over 30 Minutes Intravenous  Once 06/11/2020 2144 05/26/2020 2237      PRN meds: acetaminophen, bisacodyl, chlorpheniramine-HYDROcodone, diphenhydrAMINE, glycopyrrolate **OR** glycopyrrolate **OR** glycopyrrolate, guaiFENesin, LORazepam, morphine injection, morphine, polyvinyl alcohol, sorbitol, milk of mag, mineral oil, glycerin (SMOG) enema   Objective: Vitals:   06/25/20 0400 06/25/20 0800  BP: 112/64 (!) 115/46  Pulse: 95 (!) 133  Resp: (!) 36 (!) 37  Temp:  99.4 F (37.4 C)  SpO2: (!) 85% (!) 69%    Intake/Output Summary (Last 24 hours) at 06/25/2020 0956 Last data filed at 06/25/2020 0932 Gross per 24 hour  Intake 105.59 ml  Output 550 ml  Net -444.41 ml   Filed Weights   06/05/2020 1807  Weight: 99.8 kg   Weight change:  Body mass index is 37.76 kg/m.   Physical Exam: General exam: Lethargic, tachypneic.  Actively dying Skin: No rashes, lesions or ulcers. HEENT: Atraumatic, normocephalic, supple neck, no obvious bleeding. Lungs: Mild crackles in both bases, more on the right. CVS: Tachycardic GI/Abd soft, nontender, nondistended, bowel sound present CNS: Lethargic Psychiatry: Anxious Extremities: No pedal edema, no calf tenderness  Data Review: I have personally reviewed the laboratory data and studies available.  Recent Labs  Lab 06/21/20 0144 06/21/20 0611 06/22/20 0533 06/24/20 0329  WBC 24.4* 18.2* 15.6* 18.2*  NEUTROABS 21.7*  --   --  17.7*  HGB 13.2 12.7 12.2 12.2  HCT 38.0 35.5* 35.3* 33.6*  MCV 83.3 82.2 84.7 81.2  PLT 412* 332 272 215   Recent Labs  Lab 06/21/20 0144  06/22/20 0533 06/24/20 0329  NA 132* 134* 136  K 3.1* 3.7 4.8  CL 87* 88* 91*  CO2 30 35* 28  GLUCOSE 133* 125* 153*  BUN 30* 32* 36*  CREATININE 0.96 0.77 0.72  CALCIUM 8.7* 8.7* 8.4*  MG  --   --  2.4  PHOS  --   --  4.1    Signed, Terrilee Croak, MD [facility] Pager: [phone] (Secure Chat preferred). 06/25/2020

## 2020-06-25 NOTE — Progress Notes (Signed)
Patient ID: Kristina Ramos, female   DOB: 04-10-1941, 79 y.o.   MRN: [id number]   This NP visited patient at the bedside as a follow up for palliative medicine needs and emotional support.  Patient is currently on morphine drip for comfort, she is unresponsive to gentle touch and verbal stimuli.  She appears comfortable.  Son/Kristina Ramos is at bedside.  Created space and opportunity for him to explore his thoughts and feelings regarding his mother's current medical situation.  He is sad but does understand the limited prognosis and his hope is for comfort and that his mother "does not suffer".  Education offered on the natural trajectory and expectations of end-of-life.  Raised awareness to the visible changes of transition i.e. color change and respiration changes.  Emotional support offered.  Plan of care -DNR/DNI -Comfort and dignity the main focus of care - Prognosis is likely hours   Later in the afternoon I returned to the room and spoke again with daughter/Kristina Ramos for same conversation as above.  She to understands, and hope is for comfort and dignity  Questions and concerns addressed   Discussed with Dr. Lake Bells and bedside RN  Total time spent on the unit was 35 minutes.   Palliative medicine will continue to support holistically  Greater than 50% of the time was spent in counseling and coordination of care  Wadie Lessen NP  Palliative Medicine Team Team Phone # [phone] Pager [phone]

## 2020-06-25 NOTE — Progress Notes (Signed)
Pt has increased work of breathing and sats in the 70s. [place] increased to 100% and 50L. MD Mcquaid aware and is ordering medications to make her more comfortable.

## 2020-06-25 NOTE — Progress Notes (Signed)
Physical Therapy Discharge Patient Details Name: Kristina Ramos MRN: [id number] DOB: 1941/08/28 Today's Date: 06/25/2020 Time:  -     Patient discharged from PT services secondary to medical decline, requiring HHFNC and NRB. Palliative medicine following. Unfortunately, PT will not add benefit to patient's current condition. Please reorder if indicated.  Please see latest therapy progress note for current level of functioning and progress toward goals.    Progress and discharge plan discussed with patient and/or caregiver:    Claretha Cooper 06/25/2020, 7:37 AM [place] Pager [phone] Office [phone]

## 2020-06-25 NOTE — Progress Notes (Signed)
NAME:  Myrtha Tonkovich, MRN:  [id number], DOB:  08-30-1941, LOS: 87 ADMISSION DATE:  05/22/2020, CONSULTATION DATE:  6/25 REFERRING MD:  Bonner Puna, CHIEF COMPLAINT:  dyspnea  Brief History   79 yo female admitted initially on 6/7 with COVID 19 pneumonia, discharged 6/11 and returned on 6/19 with worsening dyspnea.  PCCM consulted for worsening hypoxemic respiratory failure on 6/27.   Past Medical History  Anemia, arthritis, depression, follicular lymphoma, GERD, Heart murmur, adenomatous colonic polyps, hyperlipidemia, hypertension  Significant Hospital Events   01/26/2020>> Pfizer SARS COV-2 vaccine #1 02/20/2020 >>Pfizer SARS COV-2 vaccine #2 04/03/2020>> 6th cycle of maintenance Gazyva  xxxxxxxxxxx 6/7-6/11 Admission for Covid Pneumonia 6/7-6/11 Remdesivir treatment 6/11 Discharged home on decadron 06/06/2020 >> Re-admission for worsening dyspnea, fever, and increasing oxygen requirements 6/7-6/16 Decadron treatment ,  was restarted 6/19 06/15/2020>> Transfer to SDU for increasing oxygen demands, CXR with more pronounced airspace opacities / worsening atelectasis  6/25 - Currently on 30 L HHFNC ( 60%) Sats are 96%, RR is 18 Pt. Was speaking in full sentences  on phone and in no distress when I examined her. She desaturates with minimal exertion, and takes about 10 minutes to rebound to sats > 92% She " feels" when she is hypoxic.  WBC is 4.7 Procalcitonin is < 0.10 Na is 134 ( Up from 126 on admission )  CRP has up trended within the last 24 hours from 3.6 to 6.9 D-dimer down trended to  2.25 ( was 2.55 on  6/24) Oxygen demands have increased to 30 L HHFNC ( 60%) last 24 hours CXR 6/25 shows increase in airspace opacities , more pronounced and worsening atelectasis Net + 1255 since admission Good response to Lasix 40 today, with 2600 cc's out this am  6/26 - still on HHFNC and face mask. Afebrile. Last echo 2019. On dvt proph. Sitting and able to got to toilet but that leaves her  tired. Othewrise WOB ok  6/27 refused BIPAP, solumedrol dose adjusted to 125mg  q6h  6/29 Palliative care/TRH/PCCM multi-disciplinary conference with patient and family: full support, DNR if arrests, no intubation  Consults:  6/25 PCCM  Procedures:    Significant Diagnostic Tests:  6/19 CT head > NAICP 6/27 Echo > LVEF 70-75%, RV size and function is normal  Micro Data:  6/7 SARS COV2 > positive 6/19 blood > neg 6/19 urine >  6/24 RVP > neg 6/25 COVID IgG > negative 6/26 urine strep > negative  Antimicrobials:  6/19>> Vanc 2 grams x 1 dose 6/19-6/20>> Cefepime  Interim history/subjective:   Called urgently to bedside for increased work of breathing Very short of breath No rest Asking for relief  Objective   Blood pressure 112/64, pulse 95, temperature (!) 97.4 F (36.3 C), temperature source Oral, resp. rate (!) 36, height 5\' 4"  (1.626 m), weight 99.8 kg, SpO2 (!) 85 %.    FiO2 (%):  [100 %] 100 %   Intake/Output Summary (Last 24 hours) at 06/25/2020 3762 Last data filed at 06/25/2020 0600 Gross per 24 hour  Intake --  Output 550 ml  Net -550 ml   Filed Weights   06/12/2020 1807  Weight: 99.8 kg    Examination:  General: Increased work of breathing, struggling to breathe in bed HENT: NCAT OP clear PULM: Crackles bases B, normal effort CV: RRR, no mgr GI: BS+, soft, nontender MSK: normal bulk and tone Derm: cyanotic skin Neuro: drowsy, wakes to voice, speaks with some difficulty   Resolved Hospital Problem list  Assessment & Plan:  ARDS due to COVID 19 pneumonia: immunocompromised, prolonged course 7/5 dramatically worse, severe increase in work of breathing, struggling to breathe, severe distress She is actively dying now, complicated by anxiety and agitation Start morphine infusion Change xanax to ativan IV Full comfort measures Continue heated high flow for now Asked family to stay at bedside Updated nursing, RT, primary and palliative care  teams Suspect she will die today in ICU  CODE STATUS: DNR  Best practice:   Per [facility] Daughter updated bedside extensively I advised the patient with the daughter and nurse present today that there was nothing more I could offer other than comfort measures  Labs   CBC: Recent Labs  Lab 06/21/20 0144 06/21/20 0611 06/22/20 0533 06/24/20 0329  WBC 24.4* 18.2* 15.6* 18.2*  NEUTROABS 21.7*  --   --  17.7*  HGB 13.2 12.7 12.2 12.2  HCT 38.0 35.5* 35.3* 33.6*  MCV 83.3 82.2 84.7 81.2  PLT 412* 332 272 053    Basic Metabolic Panel: Recent Labs  Lab 06/21/20 0144 06/22/20 0533 06/24/20 0329  NA 132* 134* 136  K 3.1* 3.7 4.8  CL 87* 88* 91*  CO2 30 35* 28  GLUCOSE 133* 125* 153*  BUN 30* 32* 36*  CREATININE 0.96 0.77 0.72  CALCIUM 8.7* 8.7* 8.4*  MG  --   --  2.4  PHOS  --   --  4.1   GFR: Estimated Creatinine Clearance: 66.5 mL/min (by C-G formula based on SCr of 0.72 mg/dL). Recent Labs  Lab 06/21/20 0144 06/21/20 0611 06/22/20 0533 06/24/20 0329  WBC 24.4* 18.2* 15.6* 18.2*    Liver Function Tests: Recent Labs  Lab 06/22/20 0533  AST 15  ALT 22  ALKPHOS 46  BILITOT 0.7  PROT 5.4*  ALBUMIN 2.9*   No results for input(s): LIPASE, AMYLASE in the last 168 hours. No results for input(s): AMMONIA in the last 168 hours.  ABG    Component Value Date/Time   HCO3 27.1 05/25/2020 1835   O2SAT 93.3 06/08/2020 1835     Coagulation Profile: No results for input(s): INR, PROTIME in the last 168 hours.  Cardiac Enzymes: No results for input(s): CKTOTAL, CKMB, CKMBINDEX, TROPONINI in the last 168 hours.  HbA1C: No results found for: HGBA1C  CBG: Recent Labs  Lab 06/24/20 1950 06/24/20 2354 06/25/20 0314 06/25/20 0325 06/25/20 0815  GLUCAP 149* 164* 98 106* 132*     Critical care time: 30 minutes    Roselie Awkward, MD [place] [facility] Pager: [phone] Cell: [phone] If no response, call [phone]

## 2020-06-26 ENCOUNTER — Encounter (HOSPITAL_COMMUNITY): Payer: Self-pay

## 2020-07-03 ENCOUNTER — Inpatient Hospital Stay: Payer: Medicare Other | Admitting: Family

## 2020-07-03 ENCOUNTER — Inpatient Hospital Stay: Payer: Medicare Other

## 2020-07-03 ENCOUNTER — Ambulatory Visit: Payer: Medicare Other

## 2020-07-03 ENCOUNTER — Ambulatory Visit: Payer: Medicare Other | Admitting: Hematology & Oncology

## 2020-07-03 ENCOUNTER — Other Ambulatory Visit: Payer: Medicare Other

## 2020-07-22 NOTE — Progress Notes (Signed)
PROGRESS NOTE  Kristina Ramos  DOB: 07/11/1941  PCP: Ann Held, [place] BSJ:[id number]  DOA: 05/31/2020  LOS: 17 days   Chief Complaint  Patient presents with  . Weakness  . Altered Mental Status   Brief narrative: Kristina Ramos is a 79 y.o. female with PMH of follicular lymphoma, hypertension, hypothyroidism, iron deficiency anemia, recent COVID-19 pneumonia. Patient presented to the ED on 05/24/2020 with complaint of weakness.  Timeline of events per previous documentations as below:  01/26/2020>> Pfizer SARS COV-2 vaccine #1 02/20/2020 >>Pfizer SARS COV-2 vaccine #2 04/03/2020>>6th cycle of maintenance Gazyva   6/7-6/11 Admission for Covid Pneumonia.  Treated with remdesivir, IV steroids. 6/11- Discharged home on decadron which she completed on 6/16.  05/25/2020- Re-admission for worsening dyspnea, fever up to 101.1, and increasing oxygen requirements.  Restarted on IV steroids,  06/15/2020>> Transferred to SDU for increasing oxygen demands to 30 L HHFNC (60%), CXR with more pronounced airspace opacities / worsening atelectasis 6/27 - refused BIPAP, solumedrol dose adjusted to 125mg  q6h 6/29 - Palliative care/TRH/PCCM multi-disciplinary conference with patient and family: full support, DNR if arrests, no intubation  Subjective: Patient was seen and examined this morning.  Lethargic Patient sister at bedside. Currently on morphine drip.  Oxygen saturation in the 80s.  Assessment/Plan: Acute on chronic hypoxic respiratory due to ARDS due to covid-19 pneumonia:  -Timeline of events as above. -Readmitted on 6/19 for persistent hypoxemia, fever. -Imaging and blood gas consistent with ARDS. -For last several days, patient has been persistently hypoxic and requiring heated high flow oxygen through nasal cannula at 40 L/min and 100% FiO2.  Despite maximum effort, patient continues to be dyspneic and unable to maintain oxygen level. -Family has agreed to comfort care.     -Currently on morphine drip.  Oxygen flow has been turned down.   Acute on chronic HFpEF  Relative adrenal insufficiency:  Pancytopenia:   Hypokalemia:  -No further blood work planned  Follicular lymphoma:   Hypothyroidism:   HTN:   Mobility: Comfort care measures Nutritional status: Body mass index is 37.76 kg/m.     Diet Order            Diet regular Room service appropriate? No; Fluid consistency: Thin  Diet effective now                Code Status:   Code Status: DNR  DVT prophylaxis: Comfort care   Antimicrobials:  None Fluid: None  Consultants: Critical care, palliative Family Communication:  Poor prognosis.  Discussed with patient's sister at bedside today.    Status is: Inpatient  Remains inpatient appropriate because highly dependent on oxygen.  Unable to discharge.  Comfort care now.  Dispo: The patient is from: Home              Anticipated d/c is to: Poor prognosis.  Likely to have in-hospital mortality              Infusions:  . dextrose    . morphine 15 mg/hr (Jun 28, 2020 1500)    Scheduled Meds: . benzonatate  100 mg Oral BID  . Chlorhexidine Gluconate Cloth  6 each Topical Daily  . mouth rinse  15 mL Mouth Rinse BID    Antimicrobials: Anti-infectives (From admission, onward)   Start     Dose/Rate Route Frequency Ordered Stop   06/10/20 2200  vancomycin (VANCOREADY) IVPB 1250 mg/250 mL  Status:  Discontinued        1,250 mg 166.7 mL/hr over  90 Minutes Intravenous Every 24 hours 06/10/20 0530 06/10/20 1542   06/10/20 1700  valACYclovir (VALTREX) tablet 1,000 mg  Status:  Discontinued        1,000 mg Oral Daily 06/10/20 1610 06/10/20 1612   06/10/20 0615  ceFEPIme (MAXIPIME) 2 g in sodium chloride 0.9 % 100 mL IVPB  Status:  Discontinued        2 g 200 mL/hr over 30 Minutes Intravenous Every 8 hours 06/10/20 0522 06/10/20 0829   06/02/2020 2145  vancomycin (VANCOREADY) IVPB 2000 mg/400 mL        2,000 mg 200 mL/hr over 120  Minutes Intravenous  Once 06/12/2020 2144 06/10/20 0135   06/03/2020 2145  ceFEPIme (MAXIPIME) 2 g in sodium chloride 0.9 % 100 mL IVPB        2 g 200 mL/hr over 30 Minutes Intravenous  Once 05/23/2020 2144 06/15/2020 2237      PRN meds: acetaminophen, bisacodyl, chlorpheniramine-HYDROcodone, diphenhydrAMINE, glycopyrrolate **OR** glycopyrrolate **OR** glycopyrrolate, guaiFENesin, LORazepam, morphine injection, morphine, polyvinyl alcohol, sorbitol, milk of mag, mineral oil, glycerin (SMOG) enema   Objective: Vitals:   Jul 19, 2020 1400 07-19-20 1500  BP:    Pulse: (!) 112 (!) 111  Resp: 16 16  Temp:    SpO2: (!) 86% (!) 88%    Intake/Output Summary (Last 24 hours) at 07/19/2020 1559 Last data filed at Jul 19, 2020 1500 Gross per 24 hour  Intake 375.13 ml  Output 0 ml  Net 375.13 ml   Filed Weights   06/11/2020 1807  Weight: 99.8 kg   Weight change:  Body mass index is 37.76 kg/m.   Physical Exam: General exam: Lethargic, tachypneic.  Actively dying Skin: No rashes, lesions or ulcers. HEENT: Atraumatic, normocephalic, supple neck, no obvious bleeding. Lungs: Mild crackles in both bases, more on the right. CVS: Tachycardic GI/Abd soft, nontender, nondistended, bowel sound present CNS: Lethargic Psychiatry: Anxious Extremities: No pedal edema, no calf tenderness  Data Review: I have personally reviewed the laboratory data and studies available.  Recent Labs  Lab 06/21/20 0144 06/21/20 0611 06/22/20 0533 06/24/20 0329  WBC 24.4* 18.2* 15.6* 18.2*  NEUTROABS 21.7*  --   --  17.7*  HGB 13.2 12.7 12.2 12.2  HCT 38.0 35.5* 35.3* 33.6*  MCV 83.3 82.2 84.7 81.2  PLT 412* 332 272 215   Recent Labs  Lab 06/21/20 0144 06/22/20 0533 06/24/20 0329  NA 132* 134* 136  K 3.1* 3.7 4.8  CL 87* 88* 91*  CO2 30 35* 28  GLUCOSE 133* 125* 153*  BUN 30* 32* 36*  CREATININE 0.96 0.77 0.72  CALCIUM 8.7* 8.7* 8.4*  MG  --   --  2.4  PHOS  --   --  4.1    Signed, Terrilee Croak,  MD [facility] Pager: [phone] (Secure Chat preferred). 07/19/2020

## 2020-07-22 NOTE — Progress Notes (Signed)
Patient asytole on monitor verified in two leads. No spontaneous breath sounds and no heart sounds ausculted. Patient family at bedside and emotional. MD will be notified. Patient pronounced by two RN's Genell RN and Sheran Luz.   Patient belongings sent with patient's family.

## 2020-07-22 NOTE — Discharge Summary (Signed)
Death Summary  Kristina Ramos HFW:[id number] DOB: May 16, 1941 DOA: Jun 14, 2020  PCP: Kristina Ramos  Admit date: June 14, 2020 Date of Death: 07/01/2020 Time of Death: 03-05-1945 (7:46 pm) Notification: Kristina Ramos notified of death of 07/13/2020   History of present illness:  Kristina Ramos is a 79 y.o. female with PMH of follicular lymphoma, hypertension, hypothyroidism, iron deficiency anemia, recent COVID-19 pneumonia. Patient presented to the ED on 2020-06-14 with complaint of weakness.  Timeline of events per previous documentations as below:  01/26/2020>> Pfizer SARS COV-2 vaccine #1 02/20/2020 >>Pfizer SARS COV-2 vaccine #2 04/03/2020>>6th cycle of maintenance Gazyva   05/2010/07/11 Admission for Covid Pneumonia.  Treated with remdesivir, IV steroids. 6/11- Discharged home on decadron which she completed on 6/16.  06/14/20- Re-admission for worsening dyspnea, fever up to 101.1, and increasing oxygen requirements.  Restarted on IV steroids,  06/15/2020>> Transferred to SDU for increasing oxygen demands to 30 L [facility]HFNC (60%), CXR with more pronounced airspace opacities / worsening atelectasis 6/27 - refused BIPAP, solumedrol dose adjusted to 125mg  q6h 6/29 - Palliative care/TRH/PCCM multi-disciplinary conference with patient and family: full support, DNR if arrests, no intubation 07/02/23 - 7:46 pm - patient died.  Final Diagnoses:  1. Acute on chronic hypoxic respiratory failure 2. ARDS 3. Covid pneumonia   The results of significant diagnostics from this hospitalization (including imaging, microbiology, ancillary and laboratory) are listed below for reference.    Significant Diagnostic Studies: CT Head Wo Contrast  Result Date: 2020/06/14 CLINICAL DATA:  Altered mental status EXAM: CT HEAD WITHOUT CONTRAST TECHNIQUE: Contiguous axial images were obtained from the base of the skull through the vertex without intravenous contrast. COMPARISON:  CT of the paranasal sinuses  dated 01/22/2010 FINDINGS: Brain: No evidence of acute infarction, hemorrhage, hydrocephalus, extra-axial collection or mass lesion/mass effect. There is mild cerebral volume loss with associated ex vacuo dilatation. Periventricular white matter hypoattenuation likely represents chronic small vessel ischemic disease. Vascular: There are vascular calcifications in the carotid siphons. Skull: Normal. Negative for fracture or focal lesion. Sinuses/Orbits: Moderate sinus disease is seen in the right frontal, right ethmoid, right sphenoid, and bilateral maxillary sinuses. Other: None. IMPRESSION: 1. No acute intracranial process. Electronically Signed   By: Zerita Boers M.D.   On: 14-Jun-2020 19:29   DG CHEST PORT 1 VIEW  Result Date: 06/21/2020 CLINICAL DATA:  Respiratory failure. EXAM: PORTABLE CHEST 1 VIEW COMPARISON:  06/18/2020 FINDINGS: Stable borderline cardiac enlargement and tortuosity and calcification of the thoracic aorta. Persistent diffuse but patchy and asymmetric interstitial and airspace process in the lungs. No definite pleural effusions or pneumothorax. IMPRESSION: Persistent diffuse interstitial and airspace process. Electronically Signed   By: Marijo Sanes M.D.   On: 06/21/2020 06:35   DG CHEST PORT 1 VIEW  Result Date: 06/18/2020 CLINICAL DATA:  COVID-19 infection EXAM: PORTABLE CHEST 1 VIEW COMPARISON:  Yesterday FINDINGS: Patchy bilateral pulmonary infiltrate correlating with history. Stable heart size. No visible effusion or air leak. IMPRESSION: Stable bilateral pneumonia. Electronically Signed   By: Monte Fantasia M.D.   On: 06/18/2020 06:32   DG CHEST PORT 1 VIEW  Result Date: 06/17/2020 CLINICAL DATA:  Chest pain EXAM: PORTABLE CHEST 1 VIEW COMPARISON:  06/16/2020 FINDINGS: Unchanged bilateral airspace opacities, left-greater-than-right. Cardiomediastinal contours are normal. No sizable pleural effusion. IMPRESSION: Unchanged bilateral airspace opacities, left-greater-than-right.  Electronically Signed   By: Ulyses Jarred M.D.   On: 06/17/2020 00:35   DG CHEST PORT 1 VIEW  Result Date: 06/16/2020 CLINICAL DATA:  Respiratory failure.  Follow-up exam. EXAM: PORTABLE CHEST 1 VIEW COMPARISON:  06/15/2020 and earlier studies. FINDINGS: Bilateral airspace lung opacities are unchanged from the most recent prior exam. No new lung abnormalities. No convincing pleural effusion and no pneumothorax. Cardiac silhouette is normal in size. IMPRESSION: 1. No significant change from the previous day's study. 2. Persistent bilateral airspace lung opacities consistent with multifocal pneumonia. Electronically Signed   By: Lajean Manes M.D.   On: 06/16/2020 08:35   DG CHEST PORT 1 VIEW  Result Date: 06/15/2020 CLINICAL DATA:  COVID-19 EXAM: PORTABLE CHEST 1 VIEW COMPARISON:  Radiograph 06/11/2020 FINDINGS: Diffuse heterogeneous airspace opacities present slightly more pronounced than on the most recent comparison examination though some of this may be due to diminished lung volumes and worsening atelectatic change. No pneumothorax or visible effusions. Cardiomediastinal contours are partially obscured though essentially unchanged from the comparison study accounting for differences in inflation. No acute osseous or soft tissue abnormality. Telemetry leads overlie the chest. IMPRESSION: Diffuse heterogeneous airspace opacities slightly more pronounced than on the most recent comparison examination though some of this may be due to worsening atelectasis. Electronically Signed   By: Lovena Le M.D.   On: 06/15/2020 05:35   DG Chest Port 1 View  Result Date: 06/11/2020 CLINICAL DATA:  Pneumonia due to COVID-19 virus EXAM: PORTABLE CHEST 1 VIEW COMPARISON:  05/22/2020 FINDINGS: Bilateral airspace disease. Mild progression right upper lobe airspace disease. Little change on the left. No effusion. IMPRESSION: Bilateral infiltrates compatible with pneumonia. Mild progression of right upper lobe.  Electronically Signed   By: Franchot Gallo M.D.   On: 06/11/2020 09:10   DG Chest Port 1 View  Result Date: 06/20/2020 CLINICAL DATA:  Fever. EXAM: PORTABLE CHEST 1 VIEW COMPARISON:  May 28, 2020 FINDINGS: Mild, stable diffusely increased lung markings are seen. Mild infiltrates are noted within the mid left lung and left lung base. This is very mildly increased in severity when compared to the prior study. Mild stable infiltrates are seen along the periphery of the right lung. There is no evidence of a pleural effusion or pneumothorax. The heart size and mediastinal contours are within normal limits. The visualized skeletal structures are unremarkable. IMPRESSION: Mild bilateral infiltrates, very mildly increased in severity when compared to the prior study. Electronically Signed   By: Virgina Norfolk M.D.   On: 06/03/2020 19:09   ECHOCARDIOGRAM COMPLETE  Result Date: 06/17/2020    ECHOCARDIOGRAM REPORT   Patient Name:   Kellis REBA Shipman Date of Exam: 06/17/2020 Medical Rec #:  [id number]      Height:       64.0 in Accession #:    [id number]     Weight:       220.0 lb Date of Birth:  1941/01/23       BSA:          2.037 m Patient Age:    58 years       BP:           159/73 mmHg Patient Gender: F              HR:           89 bpm. Exam Location:  Inpatient Procedure: 2D Echo, Cardiac Doppler and Color Doppler Indications:    Acute Respiratory Insufficiency 518.82 / R06.89  History:        Patient has prior history of Echocardiogram examinations, most  recent 01/13/2018. Signs/Symptoms:Murmur; Risk                 Factors:Hypertension and Dyslipidemia. Thyroid Disease. GERD.  Sonographer:    Jonelle Sidle Dance Referring Phys: [place]  1. Left ventricular ejection fraction, by estimation, is 70 to 75%. The left ventricle has hyperdynamic function. The left ventricle has no regional wall motion abnormalities. Left ventricular diastolic parameters are consistent with Grade I  diastolic dysfunction (impaired relaxation).  2. Right ventricular systolic function is normal. The right ventricular size is normal.  3. The mitral valve is normal in structure. No evidence of mitral valve regurgitation. No evidence of mitral stenosis.  4. The aortic valve is normal in structure. Aortic valve regurgitation is not visualized. No aortic stenosis is present.  5. The inferior vena cava is normal in size with greater than 50% respiratory variability, suggesting right atrial pressure of 3 mmHg. FINDINGS  Left Ventricle: Left ventricular ejection fraction, by estimation, is 70 to 75%. The left ventricle has hyperdynamic function. The left ventricle has no regional wall motion abnormalities. The left ventricular internal cavity size was normal in size. There is no left ventricular hypertrophy. Left ventricular diastolic parameters are consistent with Grade I diastolic dysfunction (impaired relaxation). Normal left ventricular filling pressure. Right Ventricle: The right ventricular size is normal. No increase in right ventricular wall thickness. Right ventricular systolic function is normal. Left Atrium: Left atrial size was normal in size. Right Atrium: Right atrial size was normal in size. Pericardium: There is no evidence of pericardial effusion. Mitral Valve: The mitral valve is normal in structure. Normal mobility of the mitral valve leaflets. No evidence of mitral valve regurgitation. No evidence of mitral valve stenosis. Tricuspid Valve: The tricuspid valve is normal in structure. Tricuspid valve regurgitation is not demonstrated. No evidence of tricuspid stenosis. Aortic Valve: The aortic valve is normal in structure. Aortic valve regurgitation is not visualized. No aortic stenosis is present. Pulmonic Valve: The pulmonic valve was normal in structure. Pulmonic valve regurgitation is not visualized. No evidence of pulmonic stenosis. Aorta: The aortic root is normal in size and structure. Venous:  IVC assessment for right atrial pressure unable to be performed due to mechanical ventilation. The inferior vena cava is normal in size with greater than 50% respiratory variability, suggesting right atrial pressure of 3 mmHg. IAS/Shunts: No atrial level shunt detected by color flow Doppler.  LEFT VENTRICLE PLAX 2D LVIDd:         4.00 cm  Diastology LVIDs:         2.90 cm  LV e' lateral:   6.53 cm/s LV PW:         1.00 cm  LV E/e' lateral: 8.7 LV IVS:        1.10 cm  LV e' medial:    4.79 cm/s LVOT diam:     2.00 cm  LV E/e' medial:  11.9 LV SV:         77 LV SV Index:   38 LVOT Area:     3.14 cm  RIGHT VENTRICLE             IVC RV Basal diam:  2.30 cm     IVC diam: 2.10 cm RV S prime:     15.20 cm/s TAPSE (M-mode): 1.5 cm LEFT ATRIUM             Index       RIGHT ATRIUM  Index LA diam:        3.50 cm 1.72 cm/m  RA Area:     10.10 cm LA Vol (A2C):   28.6 ml 14.04 ml/m RA Volume:   16.80 ml  8.25 ml/m LA Vol (A4C):   31.5 ml 15.46 ml/m LA Biplane Vol: 31.2 ml 15.31 ml/m  AORTIC VALVE LVOT Vmax:   116.00 cm/s LVOT Vmean:  83.800 cm/s LVOT VTI:    0.246 m  AORTA Ao Root diam: 2.90 cm Ao Asc diam:  3.40 cm MITRAL VALVE MV Area (PHT): 2.56 cm    SHUNTS MV Decel Time: 296 msec    Systemic VTI:  0.25 m MV E velocity: 56.90 cm/s  Systemic Diam: 2.00 cm MV A velocity: 69.20 cm/s MV E/A ratio:  0.82 Mihai Croitoru MD Electronically signed by Sanda Klein MD Signature Date/Time: 06/17/2020/4:51:53 PM    Final    VAS [place] LOWER EXTREMITY VENOUS (DVT)  Result Date: 06/17/2020  Lower Venous DVTStudy Indications: Edema.  Risk Factors: COVID 19 positive. Limitations: Poor ultrasound/tissue interface and body habitus. Comparison Study: No prior studies. Performing Technologist: Oliver Hum RVT  Examination Guidelines: A complete evaluation includes B-mode imaging, spectral Doppler, color Doppler, and power Doppler as needed of all accessible portions of each vessel. Bilateral testing is considered an integral  part of a complete examination. Limited examinations for reoccurring indications may be performed as noted. The reflux portion of the exam is performed with the patient in reverse Trendelenburg.  +---------+---------------+---------+-----------+----------+--------------+ RIGHT    CompressibilityPhasicitySpontaneityPropertiesThrombus Aging +---------+---------------+---------+-----------+----------+--------------+ CFV      Full           Yes      Yes                                 +---------+---------------+---------+-----------+----------+--------------+ SFJ      Full                                                        +---------+---------------+---------+-----------+----------+--------------+ FV Prox  Full                                                        +---------+---------------+---------+-----------+----------+--------------+ FV Mid   Full                                                        +---------+---------------+---------+-----------+----------+--------------+ FV DistalFull                                                        +---------+---------------+---------+-----------+----------+--------------+ PFV      Full                                                        +---------+---------------+---------+-----------+----------+--------------+  POP      Full           Yes      Yes                                 +---------+---------------+---------+-----------+----------+--------------+ PTV      Full                                                        +---------+---------------+---------+-----------+----------+--------------+ PERO     Full                                                        +---------+---------------+---------+-----------+----------+--------------+   +---------+---------------+---------+-----------+----------+--------------+ LEFT     CompressibilityPhasicitySpontaneityPropertiesThrombus Aging  +---------+---------------+---------+-----------+----------+--------------+ CFV      Full           Yes      Yes                                 +---------+---------------+---------+-----------+----------+--------------+ SFJ      Full                                                        +---------+---------------+---------+-----------+----------+--------------+ FV Prox  Full                                                        +---------+---------------+---------+-----------+----------+--------------+ FV Mid   Full                                                        +---------+---------------+---------+-----------+----------+--------------+ FV DistalFull                                                        +---------+---------------+---------+-----------+----------+--------------+ PFV      Full                                                        +---------+---------------+---------+-----------+----------+--------------+ POP      Full           Yes      Yes                                 +---------+---------------+---------+-----------+----------+--------------+  PTV      Full                                                        +---------+---------------+---------+-----------+----------+--------------+ PERO     Full                                                        +---------+---------------+---------+-----------+----------+--------------+     Summary: RIGHT: - There is no evidence of deep vein thrombosis in the lower extremity.  - No cystic structure found in the popliteal fossa.  LEFT: - There is no evidence of deep vein thrombosis in the lower extremity.  - No cystic structure found in the popliteal fossa.  *See table(s) above for measurements and observations. Electronically signed by Deitra Mayo MD on 06/17/2020 at 6:52:27 PM.    Final    VAS [place] UPPER EXTREMITY VENOUS DUPLEX  Result Date: 06/14/2020 UPPER VENOUS STUDY   Indications: IV team noticed left brachial vein would not compress, Covid-19 Limitations: Body habitus. Comparison Study: No prior study on file for comparison Performing Technologist: Sharion Dove RVS  Examination Guidelines: A complete evaluation includes B-mode imaging, spectral Doppler, color Doppler, and power Doppler as needed of all accessible portions of each vessel. Bilateral testing is considered an integral part of a complete examination. Limited examinations for reoccurring indications may be performed as noted.  Right Findings: +----------+------------+---------+-----------+----------+-------+ RIGHT     CompressiblePhasicitySpontaneousPropertiesSummary +----------+------------+---------+-----------+----------+-------+ Subclavian               Yes       Yes                      +----------+------------+---------+-----------+----------+-------+  Left Findings: +----------+------------+---------+-----------+----------+--------+ LEFT      CompressiblePhasicitySpontaneousPropertiesSummary  +----------+------------+---------+-----------+----------+--------+ IJV           Full       Yes       Yes                       +----------+------------+---------+-----------+----------+--------+ Subclavian    Full       Yes       Yes                       +----------+------------+---------+-----------+----------+--------+ Axillary      Full       Yes       Yes                       +----------+------------+---------+-----------+----------+--------+ Brachial      Full       Yes       Yes              tortuous +----------+------------+---------+-----------+----------+--------+ Cephalic      Full                                           +----------+------------+---------+-----------+----------+--------+ Basilic       Full                                           +----------+------------+---------+-----------+----------+--------+  Summary:  Right: No evidence  of thrombosis in the subclavian.  Left: No evidence of deep vein thrombosis in the upper extremity. No evidence of superficial vein thrombosis in the upper extremity.  *See table(s) above for measurements and observations.  Diagnosing physician: Servando Snare MD Electronically signed by Servando Snare MD on 06/14/2020 at 9:03:18 PM.    Final     Microbiology: No results found for this or any previous visit (from the past 240 hour(s)).   Labs: Basic Metabolic Panel: No results for input(s): NA, K, CL, CO2, GLUCOSE, BUN, CREATININE, CALCIUM, MG, PHOS in the last 168 hours. Liver Function Tests: No results for input(s): AST, ALT, ALKPHOS, BILITOT, PROT, ALBUMIN in the last 168 hours. No results for input(s): LIPASE, AMYLASE in the last 168 hours. No results for input(s): AMMONIA in the last 168 hours. CBC: No results for input(s): WBC, NEUTROABS, HGB, HCT, MCV, PLT in the last 168 hours. Cardiac Enzymes: No results for input(s): CKTOTAL, CKMB, CKMBINDEX, TROPONINI in the last 168 hours. D-Dimer No results for input(s): DDIMER in the last 72 hours. BNP: Invalid input(s): POCBNP CBG: No results for input(s): GLUCAP in the last 168 hours. Anemia work up No results for input(s): VITAMINB12, FOLATE, FERRITIN, TIBC, IRON, RETICCTPCT in the last 72 hours. Urinalysis    Component Value Date/Time   COLORURINE YELLOW 05/29/2020 1812   APPEARANCEUR CLEAR 05/27/2020 1812   LABSPEC 1.015 06/15/2020 1812   PHURINE 6.0 05/25/2020 1812   GLUCOSEU NEGATIVE 06/18/2020 1812   HGBUR NEGATIVE 06/10/2020 1812   BILIRUBINUR NEGATIVE 06/19/2020 1812   KETONESUR NEGATIVE 06/20/2020 1812   PROTEINUR NEGATIVE 05/23/2020 1812   NITRITE NEGATIVE 06/15/2020 1812   LEUKOCYTESUR NEGATIVE 06/12/2020 1812   Sepsis Labs Invalid input(s): PROCALCITONIN,  WBC,  LACTICIDVEN     SIGNED:  Terrilee Croak, MD  [facility] 07/08/2020, 7:54 AM

## 2020-07-22 NOTE — Progress Notes (Signed)
NAME:  Kristina Ramos, MRN:  [id number], DOB:  1941/05/11, LOS: 55 ADMISSION DATE:  06/03/2020, CONSULTATION DATE:  6/25 REFERRING MD:  Bonner Puna, CHIEF COMPLAINT:  dyspnea  Brief History   79 yo female admitted initially on 6/7 with COVID 19 pneumonia, discharged 6/11 and returned on 6/19 with worsening dyspnea.  PCCM consulted for worsening hypoxemic respiratory failure on 6/27.   Past Medical History  Anemia, arthritis, depression, follicular lymphoma, GERD, Heart murmur, adenomatous colonic polyps, hyperlipidemia, hypertension  Significant Hospital Events   01/26/2020>> Pfizer SARS COV-2 vaccine #1 02/20/2020 >>Pfizer SARS COV-2 vaccine #2 04/03/2020>> 6th cycle of maintenance Gazyva  xxxxxxxxxxx 6/7-6/11 Admission for Covid Pneumonia 6/7-6/11 Remdesivir treatment 6/11 Discharged home on decadron 06/06/2020 >> Re-admission for worsening dyspnea, fever, and increasing oxygen requirements 6/7-6/16 Decadron treatment ,  was restarted 6/19 06/15/2020>> Transfer to SDU for increasing oxygen demands, CXR with more pronounced airspace opacities / worsening atelectasis  6/25 - Currently on 30 L HHFNC ( 60%) Sats are 96%, RR is 18 Pt. Was speaking in full sentences  on phone and in no distress when I examined her. She desaturates with minimal exertion, and takes about 10 minutes to rebound to sats > 92% She " feels" when she is hypoxic.  WBC is 4.7 Procalcitonin is < 0.10 Na is 134 ( Up from 126 on admission )  CRP has up trended within the last 24 hours from 3.6 to 6.9 D-dimer down trended to  2.25 ( was 2.55 on  6/24) Oxygen demands have increased to 30 L HHFNC ( 60%) last 24 hours CXR 6/25 shows increase in airspace opacities , more pronounced and worsening atelectasis Net + 1255 since admission Good response to Lasix 40 today, with 2600 cc's out this am  6/26 - still on HHFNC and face mask. Afebrile. Last echo 2019. On dvt proph. Sitting and able to got to toilet but that leaves her  tired. Othewrise WOB ok  6/27 refused BIPAP, solumedrol dose adjusted to 125mg  q6h  6/29 Palliative care/TRH/PCCM multi-disciplinary conference with patient and family: full support, DNR if arrests, no intubation  7/5 started morphine infusion/comfort measures  Consults:  6/25 PCCM  Procedures:    Significant Diagnostic Tests:  6/19 CT head > NAICP 6/27 Echo > LVEF 70-75%, RV size and function is normal  Micro Data:  6/7 SARS COV2 > positive 6/19 blood > neg 6/19 urine >  6/24 RVP > neg 6/25 COVID IgG > negative 6/26 urine strep > negative  Antimicrobials:  6/19>> Vanc 2 grams x 1 dose 6/19-6/20>> Cefepime  Interim history/subjective:   On morphine infusion Respirations non-labored  Objective   Blood pressure (!) 115/46, pulse (!) 116, temperature 99.4 F (37.4 C), temperature source Axillary, resp. rate 16, height 5\' 4"  (1.626 m), weight 99.8 kg, SpO2 (!) 86 %.    FiO2 (%):  [100 %] 100 %   Intake/Output Summary (Last 24 hours) at 07-21-20 0741 Last data filed at Jul 21, 2020 0700 Gross per 24 hour  Intake 440.47 ml  Output 100 ml  Net 340.47 ml   Filed Weights   06/08/2020 1807  Weight: 99.8 kg    Examination:  General:  Asleep, agonal respirations HENT: NCAT OP clear PULM: Crackles bases B, increased effort, infrequent breaths CV: RRR, no mgr GI: BS+, soft, nontender MSK: normal bulk and tone Neuro: asleep    Resolved Hospital Problem list     Assessment & Plan:  ARDS due to COVID 19 pneumonia: immunocompromised, prolonged course 7/6  dramatically worse, severe increase in work of breathing, struggling to breathe, severe distress She is actively dying now, complicated by anxiety and agitation Morphine infusion to continue Ativan if evidence of anxiety Would transition off of heated high flow oxygen today for patient comfort  CODE STATUS: DNR  Best practice:   Per TRH I updated her sister bedside   Labs   CBC: Recent Labs  Lab  06/21/20 0144 06/21/20 0611 06/22/20 0533 06/24/20 0329  WBC 24.4* 18.2* 15.6* 18.2*  NEUTROABS 21.7*  --   --  17.7*  HGB 13.2 12.7 12.2 12.2  HCT 38.0 35.5* 35.3* 33.6*  MCV 83.3 82.2 84.7 81.2  PLT 412* 332 272 725    Basic Metabolic Panel: Recent Labs  Lab 06/21/20 0144 06/22/20 0533 06/24/20 0329  NA 132* 134* 136  K 3.1* 3.7 4.8  CL 87* 88* 91*  CO2 30 35* 28  GLUCOSE 133* 125* 153*  BUN 30* 32* 36*  CREATININE 0.96 0.77 0.72  CALCIUM 8.7* 8.7* 8.4*  MG  --   --  2.4  PHOS  --   --  4.1   GFR: Estimated Creatinine Clearance: 66.5 mL/min (by C-G formula based on SCr of 0.72 mg/dL). Recent Labs  Lab 06/21/20 0144 06/21/20 0611 06/22/20 0533 06/24/20 0329  WBC 24.4* 18.2* 15.6* 18.2*    Liver Function Tests: Recent Labs  Lab 06/22/20 0533  AST 15  ALT 22  ALKPHOS 46  BILITOT 0.7  PROT 5.4*  ALBUMIN 2.9*   No results for input(s): LIPASE, AMYLASE in the last 168 hours. No results for input(s): AMMONIA in the last 168 hours.  ABG    Component Value Date/Time   HCO3 27.1 05/22/2020 1835   O2SAT 93.3 06/12/2020 1835     Coagulation Profile: No results for input(s): INR, PROTIME in the last 168 hours.  Cardiac Enzymes: No results for input(s): CKTOTAL, CKMB, CKMBINDEX, TROPONINI in the last 168 hours.  HbA1C: No results found for: HGBA1C  CBG: Recent Labs  Lab 06/24/20 1950 06/24/20 2354 06/25/20 0314 06/25/20 0325 06/25/20 0815  GLUCAP 149* 164* 98 106* 132*     Critical care time: n/a    Roselie Awkward, MD [place] [facility] Pager: [phone] Cell: [phone] If no response, call [phone]

## 2020-07-22 NOTE — TOC Progression Note (Addendum)
Transition of Care [facility]) - Progression Note    Patient Details  Name: Kristina Ramos MRN: [id number] Date of Birth: 13-Jul-1941  [facility] CM/SW Contact  Leeroy Cha, RN Phone Number: 06-28-20, 8:25 AM  Clinical Narrative:    Remains on hfnc with nrb at 10l/min,Iv ms 100mg  at 1mg /ml. Plan hospital death is expected.  Palliative care following.  Expected Discharge Plan: [place] Barriers to Discharge: No Barriers Identified  Expected Discharge Plan and Services Expected Discharge Plan: [place]   Discharge Planning Services: CM Consult   Living arrangements for the past 2 months: Single Family Home                           HH Arranged: RN, PT, Nurse's Aide [place] Agency: [place] Date [place]: 06/11/20 Time HH Agency Contacted: 1000 Representative spoke with at [place]: [place] ([place]) Interventions    Readmission Risk Interventions No flowsheet data found.

## 2020-07-22 DEATH — deceased
